# Patient Record
Sex: Male | Born: 1940 | Race: White | Hispanic: No | Marital: Married | Smoking: Former smoker
Health system: Southern US, Community
[De-identification: ages and names within clinical notes are randomized; demographics above are authoritative.]

## PROBLEM LIST (undated history)

## (undated) DIAGNOSIS — K8020 Calculus of gallbladder without cholecystitis without obstruction: Secondary | ICD-10-CM

## (undated) DIAGNOSIS — N200 Calculus of kidney: Secondary | ICD-10-CM

## (undated) DIAGNOSIS — M726 Necrotizing fasciitis: Secondary | ICD-10-CM

## (undated) DIAGNOSIS — E785 Hyperlipidemia, unspecified: Secondary | ICD-10-CM

## (undated) DIAGNOSIS — M1990 Unspecified osteoarthritis, unspecified site: Secondary | ICD-10-CM

## (undated) DIAGNOSIS — I739 Peripheral vascular disease, unspecified: Secondary | ICD-10-CM

## (undated) DIAGNOSIS — H16329 Diffuse interstitial keratitis, unspecified eye: Secondary | ICD-10-CM

## (undated) DIAGNOSIS — K219 Gastro-esophageal reflux disease without esophagitis: Secondary | ICD-10-CM

## (undated) DIAGNOSIS — I10 Essential (primary) hypertension: Secondary | ICD-10-CM

## (undated) DIAGNOSIS — K279 Peptic ulcer, site unspecified, unspecified as acute or chronic, without hemorrhage or perforation: Secondary | ICD-10-CM

## (undated) DIAGNOSIS — Z89511 Acquired absence of right leg below knee: Secondary | ICD-10-CM

## (undated) DIAGNOSIS — I2510 Atherosclerotic heart disease of native coronary artery without angina pectoris: Secondary | ICD-10-CM

## (undated) DIAGNOSIS — K635 Polyp of colon: Secondary | ICD-10-CM

## (undated) DIAGNOSIS — I219 Acute myocardial infarction, unspecified: Secondary | ICD-10-CM

## (undated) DIAGNOSIS — E119 Type 2 diabetes mellitus without complications: Secondary | ICD-10-CM

## (undated) DIAGNOSIS — D1803 Hemangioma of intra-abdominal structures: Secondary | ICD-10-CM

## (undated) HISTORY — DX: Calculus of gallbladder without cholecystitis without obstruction: K80.20

## (undated) HISTORY — DX: Peptic ulcer, site unspecified, unspecified as acute or chronic, without hemorrhage or perforation: K27.9

## (undated) HISTORY — DX: Hemangioma of intra-abdominal structures: D18.03

## (undated) HISTORY — DX: Peripheral vascular disease, unspecified: I73.9

## (undated) HISTORY — DX: Gastro-esophageal reflux disease without esophagitis: K21.9

## (undated) HISTORY — DX: Unspecified osteoarthritis, unspecified site: M19.90

## (undated) HISTORY — DX: Polyp of colon: K63.5

## (undated) HISTORY — DX: Diffuse interstitial keratitis, unspecified eye: H16.329

## (undated) HISTORY — DX: Essential (primary) hypertension: I10

## (undated) HISTORY — DX: Hyperlipidemia, unspecified: E78.5

## (undated) HISTORY — DX: Calculus of kidney: N20.0

## (undated) HISTORY — DX: Atherosclerotic heart disease of native coronary artery without angina pectoris: I25.10

## (undated) HISTORY — DX: Acute myocardial infarction, unspecified: I21.9

## (undated) HISTORY — DX: Necrotizing fasciitis: M72.6

## (undated) HISTORY — PX: LITHOTRIPSY: SUR834

## (undated) HISTORY — DX: Type 2 diabetes mellitus without complications: E11.9

---

## 1997-11-25 DIAGNOSIS — I219 Acute myocardial infarction, unspecified: Secondary | ICD-10-CM

## 1997-11-25 HISTORY — PX: CORONARY ARTERY BYPASS GRAFT: SHX141

## 1997-11-25 HISTORY — PX: LEG AMPUTATION: SHX1105

## 1997-11-25 HISTORY — DX: Acute myocardial infarction, unspecified: I21.9

## 1998-09-28 ENCOUNTER — Encounter: Payer: Self-pay | Admitting: Cardiology

## 1998-09-28 ENCOUNTER — Inpatient Hospital Stay (HOSPITAL_COMMUNITY): Admission: AD | Admit: 1998-09-28 | Discharge: 1998-10-18 | Payer: Self-pay | Admitting: Cardiology

## 1998-09-29 ENCOUNTER — Encounter: Payer: Self-pay | Admitting: Thoracic Surgery (Cardiothoracic Vascular Surgery)

## 1998-09-30 ENCOUNTER — Encounter: Payer: Self-pay | Admitting: Thoracic Surgery (Cardiothoracic Vascular Surgery)

## 1998-10-01 ENCOUNTER — Encounter: Payer: Self-pay | Admitting: Thoracic Surgery (Cardiothoracic Vascular Surgery)

## 1998-10-03 ENCOUNTER — Encounter: Payer: Self-pay | Admitting: Thoracic Surgery (Cardiothoracic Vascular Surgery)

## 1998-10-04 ENCOUNTER — Encounter: Payer: Self-pay | Admitting: Thoracic Surgery (Cardiothoracic Vascular Surgery)

## 1998-10-05 ENCOUNTER — Encounter: Payer: Self-pay | Admitting: Cardiothoracic Surgery

## 1998-10-06 ENCOUNTER — Encounter: Payer: Self-pay | Admitting: Thoracic Surgery (Cardiothoracic Vascular Surgery)

## 1998-10-07 ENCOUNTER — Encounter: Payer: Self-pay | Admitting: Thoracic Surgery (Cardiothoracic Vascular Surgery)

## 1998-10-08 ENCOUNTER — Encounter: Payer: Self-pay | Admitting: Cardiothoracic Surgery

## 1998-10-09 ENCOUNTER — Encounter: Payer: Self-pay | Admitting: Thoracic Surgery (Cardiothoracic Vascular Surgery)

## 1998-10-10 ENCOUNTER — Encounter: Payer: Self-pay | Admitting: Thoracic Surgery (Cardiothoracic Vascular Surgery)

## 1998-10-13 ENCOUNTER — Encounter: Payer: Self-pay | Admitting: Thoracic Surgery (Cardiothoracic Vascular Surgery)

## 1998-11-13 ENCOUNTER — Encounter: Admission: RE | Admit: 1998-11-13 | Discharge: 1999-02-11 | Payer: Self-pay | Admitting: Cardiology

## 1999-01-02 ENCOUNTER — Encounter
Admission: RE | Admit: 1999-01-02 | Discharge: 1999-04-02 | Payer: Self-pay | Admitting: Physical Medicine and Rehabilitation

## 1999-01-29 ENCOUNTER — Encounter
Admission: RE | Admit: 1999-01-29 | Discharge: 1999-04-24 | Payer: Self-pay | Admitting: Physical Medicine and Rehabilitation

## 1999-02-16 ENCOUNTER — Encounter
Admission: RE | Admit: 1999-02-16 | Discharge: 1999-05-14 | Payer: Self-pay | Admitting: Physical Medicine and Rehabilitation

## 2004-01-10 ENCOUNTER — Inpatient Hospital Stay (HOSPITAL_COMMUNITY): Admission: EM | Admit: 2004-01-10 | Discharge: 2004-01-12 | Payer: Self-pay | Admitting: *Deleted

## 2005-04-25 ENCOUNTER — Ambulatory Visit: Payer: Self-pay | Admitting: Cardiology

## 2005-11-25 HISTORY — PX: APPENDECTOMY: SHX54

## 2006-02-13 ENCOUNTER — Ambulatory Visit: Payer: Self-pay | Admitting: Infectious Diseases

## 2006-02-13 ENCOUNTER — Inpatient Hospital Stay (HOSPITAL_COMMUNITY): Admission: EM | Admit: 2006-02-13 | Discharge: 2006-02-20 | Payer: Self-pay | Admitting: Emergency Medicine

## 2006-02-13 ENCOUNTER — Ambulatory Visit: Payer: Self-pay | Admitting: Cardiology

## 2006-02-14 ENCOUNTER — Encounter (INDEPENDENT_AMBULATORY_CARE_PROVIDER_SITE_OTHER): Payer: Self-pay | Admitting: Specialist

## 2006-11-12 ENCOUNTER — Ambulatory Visit: Payer: Self-pay

## 2007-02-13 ENCOUNTER — Encounter: Admission: RE | Admit: 2007-02-13 | Discharge: 2007-02-13 | Payer: Self-pay | Admitting: Rheumatology

## 2007-10-28 ENCOUNTER — Ambulatory Visit: Payer: Self-pay | Admitting: Cardiology

## 2008-03-31 ENCOUNTER — Emergency Department (HOSPITAL_COMMUNITY): Admission: EM | Admit: 2008-03-31 | Discharge: 2008-04-01 | Payer: Self-pay | Admitting: Emergency Medicine

## 2008-04-06 ENCOUNTER — Ambulatory Visit (HOSPITAL_BASED_OUTPATIENT_CLINIC_OR_DEPARTMENT_OTHER): Admission: RE | Admit: 2008-04-06 | Discharge: 2008-04-06 | Payer: Self-pay | Admitting: Urology

## 2008-04-11 ENCOUNTER — Ambulatory Visit (HOSPITAL_COMMUNITY): Admission: RE | Admit: 2008-04-11 | Discharge: 2008-04-11 | Payer: Self-pay | Admitting: Urology

## 2008-11-02 ENCOUNTER — Ambulatory Visit: Payer: Self-pay | Admitting: Cardiology

## 2008-11-10 ENCOUNTER — Ambulatory Visit: Payer: Self-pay

## 2008-11-10 ENCOUNTER — Ambulatory Visit: Payer: Self-pay | Admitting: Cardiology

## 2008-11-10 LAB — CONVERTED CEMR LAB
ALT: 19 units/L (ref 0–53)
Alkaline Phosphatase: 28 units/L — ABNORMAL LOW (ref 39–117)
Bilirubin, Direct: 0.1 mg/dL (ref 0.0–0.3)
LDL Cholesterol: 85 mg/dL (ref 0–99)
Total Bilirubin: 0.7 mg/dL (ref 0.3–1.2)
VLDL: 12 mg/dL (ref 0–40)

## 2009-08-04 ENCOUNTER — Telehealth: Payer: Self-pay | Admitting: Cardiology

## 2009-09-13 ENCOUNTER — Encounter: Payer: Self-pay | Admitting: Cardiology

## 2009-10-31 DIAGNOSIS — E785 Hyperlipidemia, unspecified: Secondary | ICD-10-CM | POA: Insufficient documentation

## 2009-10-31 DIAGNOSIS — I10 Essential (primary) hypertension: Secondary | ICD-10-CM | POA: Insufficient documentation

## 2009-10-31 DIAGNOSIS — I2510 Atherosclerotic heart disease of native coronary artery without angina pectoris: Secondary | ICD-10-CM

## 2009-11-01 ENCOUNTER — Ambulatory Visit: Payer: Self-pay | Admitting: Cardiology

## 2010-02-07 ENCOUNTER — Telehealth: Payer: Self-pay | Admitting: Cardiology

## 2010-02-13 ENCOUNTER — Encounter: Payer: Self-pay | Admitting: Cardiology

## 2010-02-20 ENCOUNTER — Encounter: Admission: RE | Admit: 2010-02-20 | Discharge: 2010-02-20 | Payer: Self-pay | Admitting: Rheumatology

## 2010-06-26 ENCOUNTER — Encounter: Payer: Self-pay | Admitting: Cardiology

## 2010-07-07 ENCOUNTER — Encounter: Payer: Self-pay | Admitting: Cardiology

## 2010-07-10 ENCOUNTER — Encounter: Payer: Self-pay | Admitting: Cardiology

## 2010-12-04 ENCOUNTER — Encounter: Payer: Self-pay | Admitting: Cardiology

## 2010-12-27 NOTE — Consult Note (Signed)
Summary: [facility]  [facility]   Imported By: Marylou Mccoy 04/26/2010 15:53:31  _____________________________________________________________________  External Attachment:    Type:   Image     Comment:   External Document

## 2010-12-27 NOTE — Progress Notes (Signed)
Summary: REFILL   Phone Note Refill Request Message from:  Patient on February 07, 2010 2:39 PM  Refills Requested: Medication #1:  HYDROCHLOROTHIAZIDE 25 MG TABS 1 by mouth daily  Medication #2:  SIMVASTATIN 20 MG TABS 1 by mouth daily  Medication #3:  TOPROL XL 50 MG XR24H-TAB 1 by mouth daily SEND TO [facility]  Initial call taken by: Judie Grieve,  February 07, 2010 2:39 PM    Prescriptions: TOPROL XL 50 MG XR24H-TAB (METOPROLOL SUCCINATE) 1 by mouth daily  #90 x 3   Entered by:   Kem Parkinson   Authorized by:   Rollene Rotunda, MD, [facility]   Signed by:   Kem Parkinson on 02/07/2010   Method used:   Electronically to        [facility]* (mail-order)             ,          Ph: [phone]       Fax: [phone]   RxID:   [id number] SIMVASTATIN 20 MG TABS (SIMVASTATIN) 1 by mouth daily  #90 x 3   Entered by:   Kem Parkinson   Authorized by:   Rollene Rotunda, MD, [facility]   Signed by:   Kem Parkinson on 02/07/2010   Method used:   Electronically to        [facility]* (mail-order)             ,          Ph: [phone]       Fax: [phone]   RxID:   [id number] HYDROCHLOROTHIAZIDE 25 MG TABS (HYDROCHLOROTHIAZIDE) 1 by mouth daily  #90 x 3   Entered by:   Kem Parkinson   Authorized by:   Rollene Rotunda, MD, [facility]   Signed by:   Kem Parkinson on 02/07/2010   Method used:   Electronically to        [facility]* (mail-order)             ,          Ph: [phone]       Fax: [phone]   RxID:   [id number]

## 2010-12-27 NOTE — Letter (Signed)
Summary: [facility] Pres[facility]  [facility]   Imported By: Roderic Ovens 07/16/2010 11:32:07  _____________________________________________________________________  External Attachment:    Type:   Image     Comment:   External Document

## 2010-12-27 NOTE — Letter (Signed)
Summary: [facility] Office Visit Note   [facility] Visit [facility]   Imported By: Roderic Ovens 07/18/2010 11:36:28  _____________________________________________________________________  External Attachment:    Type:   Image     Comment:   External Document

## 2010-12-27 NOTE — Letter (Signed)
Summary: [facility]  [facility]   Imported By: Marylou Mccoy 12/11/2010 11:41:38  _____________________________________________________________________  External Attachment:    Type:   Image     Comment:   External Document

## 2011-01-21 ENCOUNTER — Telehealth: Payer: Self-pay | Admitting: Cardiology

## 2011-01-24 ENCOUNTER — Ambulatory Visit: Payer: Self-pay | Admitting: Cardiology

## 2011-01-31 NOTE — Progress Notes (Signed)
Summary: pt needs Rx asap  Medications Added HYDROCHLOROTHIAZIDE 25 MG TABS (HYDROCHLOROTHIAZIDE) 1 by mouth daily SIMVASTATIN 20 MG TABS (SIMVASTATIN) 1 by mouth daily METOPROLOL TARTRATE 25 MG TABS (METOPROLOL TARTRATE) 1 by mouth daily TOPROL XL 50 MG XR24H-TAB (METOPROLOL SUCCINATE) 1 by mouth daily ASPIRIN 81 MG  TABS (ASPIRIN) 1 by mouth daily MULTIVITAMINS   TABS (MULTIPLE VITAMIN) 1 by mouth daily CALCIUM-CARB 600 + D 600-125 MG-UNIT TABS (CALCIUM-VITAMIN D) 1 by mouth daily ZANTAC 75 75 MG TABS (RANITIDINE HCL) 2 by mouth daily PREDNISONE 1 MG TABS (PREDNISONE) 3 by mouth daily RESTORIL 15 MG CAPS (TEMAZEPAM) 2 by mouth daily FOSAMAX 70 MG TABS (ALENDRONATE SODIUM) 1 by mouth weekly PRILOSEC 20 MG CPDR (OMEPRAZOLE) 1 by mouth daily       Phone Note Refill Request Call back at Home Phone [phone] Message from:  Patient  pt needs to change all RX [facility] due to a insurance change to [facility] medicare complete plan with [facility] and the Member id # is [id number]  Initial call taken by: Omer Jack,  January 21, 2011 11:38 AM  Follow-up for Phone Call        RX sent into pharmacy. Pt is aware to make appt with [facility] asap because he is overdue and for [place] to keep filling his meds we need to see him. Pt aware and will make appt. Marrion Coy, CNA  January 22, 2011 9:24 AM  Follow-up by: Marrion Coy, CNA,  January 22, 2011 9:24 AM    Prescriptions: TOPROL XL 50 MG XR24H-TAB (METOPROLOL SUCCINATE) 1 by mouth daily  #90 Not Speci x 0   Entered by:   Marrion Coy, CNA   Authorized by:   Rollene Rotunda, MD, [facility]   Signed by:   Marrion Coy, CNA on 01/22/2011   Method used:   Electronically to        [facility]* (mail-order)       [address]       [place], [place]  [address]       Ph: [phone]       Fax: [phone]   RxID:   [id number] SIMVASTATIN 20 MG TABS (SIMVASTATIN) 1 by mouth daily  #90 Tablet x 0   Entered by:   Marrion Coy, CNA   Authorized by:   Rollene Rotunda, MD, [facility]   Signed by:   Marrion Coy, CNA on 01/22/2011   Method used:   Electronically to        [facility] ([facility]order)       [address]       [place], [place]  [address]       Ph: [phone]       Fax: [phone]   RxID:   [id number] HYDROCHLOROTHIAZIDE 25 MG TABS (HYDROCHLOROTHIAZIDE) 1 by mouth daily  #90 Tablet x 0   Entered by:   Marrion Coy, CNA   Authorized by:   Rollene Rotunda, MD, [facility]   Signed by:   Marrion Coy, CNA on 01/22/2011   Method used:   Electronically to        [facility] (mail-order)       [address]   [address]       Ph: [phone]       Fax: [phone]   RxID:   [id number]

## 2011-02-06 ENCOUNTER — Ambulatory Visit (INDEPENDENT_AMBULATORY_CARE_PROVIDER_SITE_OTHER): Payer: Medicare Other | Admitting: Cardiology

## 2011-02-06 ENCOUNTER — Encounter: Payer: Self-pay | Admitting: Cardiology

## 2011-02-06 DIAGNOSIS — R0989 Other specified symptoms and signs involving the circulatory and respiratory systems: Secondary | ICD-10-CM

## 2011-02-06 DIAGNOSIS — I10 Essential (primary) hypertension: Secondary | ICD-10-CM

## 2011-02-06 DIAGNOSIS — I2510 Atherosclerotic heart disease of native coronary artery without angina pectoris: Secondary | ICD-10-CM

## 2011-02-11 ENCOUNTER — Other Ambulatory Visit: Payer: Self-pay | Admitting: Cardiology

## 2011-02-11 DIAGNOSIS — R0989 Other specified symptoms and signs involving the circulatory and respiratory systems: Secondary | ICD-10-CM

## 2011-02-12 NOTE — Miscellaneous (Signed)
Summary: correction to med list  Clinical Lists Changes  Medications: Changed medication from RESTORIL 15 MG CAPS (TEMAZEPAM) 2 by mouth daily to RESTORIL 15 MG CAPS (TEMAZEPAM) one by mouth daily

## 2011-02-12 NOTE — Assessment & Plan Note (Signed)
Summary: OV/PER PAM/MJ  Medications Added RAPAFLO 8 MG CAPS (SILODOSIN) 1 by mouth daily ADVIL PM 200-25 MG CAPS (IBUPROFEN-DIPHENHYDRAMINE HCL) 1 by mouth at bedtime LISINOPRIL 5 MG TABS (LISINOPRIL) one daily      Allergies Added: NKDA  Visit Type:  Follow-up Primary Provider:  Dr. Christell Constant  CC:  CAD.  History of Present Illness: The patient presents for follow up of coronary disease. Since I last saw him he has had no new cardiovascular complaints. He has a prosthesis in less pain in his legs and previous. He is doing some household chores but not exercising routinely. He denies chest pressure, neck or arm discomfort. He has had no palpitations, presyncope or syncope. He has had no PND or orthopnea. He has had some cramping pain and he is left neck.  Current Medications (verified): 1)  Hydrochlorothiazide 25 Mg Tabs (Hydrochlorothiazide) .Marland Kitchen.. 1 By Mouth Daily 2)  Simvastatin 20 Mg Tabs (Simvastatin) .Marland Kitchen.. 1 By Mouth Daily 3)  Toprol Xl 50 Mg Xr24h-Tab (Metoprolol Succinate) .Marland Kitchen.. 1 By Mouth Daily 4)  Aspirin 81 Mg  Tabs (Aspirin) .Marland Kitchen.. 1 By Mouth Daily 5)  Multivitamins   Tabs (Multiple Vitamin) .Marland Kitchen.. 1 By Mouth Daily 6)  Calcium-Carb 600 + D 600-125 Mg-Unit Tabs (Calcium-Vitamin D) .Marland Kitchen.. 1 By Mouth Daily 7)  Zantac 75 75 Mg Tabs (Ranitidine Hcl) .... 2 By Mouth Daily 8)  Prednisone 1 Mg Tabs (Prednisone) .... 3 By Mouth Daily 9)  Restoril 15 Mg Caps (Temazepam) .... 2 By Mouth Daily 10)  Fosamax 70 Mg Tabs (Alendronate Sodium) .Marland Kitchen.. 1 By Mouth Weekly 11)  Prilosec 20 Mg Cpdr (Omeprazole) .Marland Kitchen.. 1 By Mouth Daily 12)  Rapaflo 8 Mg Caps (Silodosin) .Marland Kitchen.. 1 By Mouth Daily 13)  Advil Pm 200-25 Mg Caps (Ibuprofen-Diphenhydramine Hcl) .Marland Kitchen.. 1 By Mouth At Bedtime  Allergies (verified): No Known Drug Allergies  Past History:  Past Medical History: Reviewed history from 10/25/2009 and no changes required.  1. Coronary artery disease (LIMA to the LAD, saphenous vein graft to       obtuse  marginal 1 and obtuse marginal 2, saphenous vein graft to       intermediate, saphenous vein graft to the right coronary artery in       1999).   2. Right lower extremity amputation.   3. Treatment of necrotizing fasciitis following coronary artery bypass       grafting.   4. Cogan syndrome.   5. Hypertension.   6. Peptic ulcer disease.   7. Dyslipidemia.      Past Surgical History: Reviewed history from 10/25/2009 and no changes required. Right lower extremity amputation 1999, CABG 1999  (LIMA to the LAD, SVG to obtuse marginal, SVG to intermediate, SVG to right  coronary artery).  Review of Systems       As stated in the HPI and negative for all other systems.   Vital Signs:  Patient profile:   70 year old male Height:      68 inches Weight:      201 pounds BMI:     30.67 Pulse rate:   55 / minute Resp:     16 per minute BP sitting:   156 / 94  (right arm)  Vitals Entered By: Marrion Coy, CNA (February 06, 2011 11:54 AM)  Physical Exam  General:  Well developed, well nourished, in no acute distress. Head:  normocephalic and atraumatic Eyes:  PERRLA/EOM intact; conjunctiva and lids normal. Mouth:  Teeth, gums and palate normal.  Oral mucosa normal. Neck:  Neck supple, no JVD. No masses, thyromegaly or abnormal cervical nodes. Chest Wall:  well healed sternotomy scar Lungs:  Clear bilaterally to auscultation and percussion. Abdomen:  Bowel sounds positive; abdomen soft and non-tender without masses, organomegaly, or hernias noted. No hepatosplenomegaly. Msk:  Back normal, normal gait. Muscle strength and tone normal. Extremities:  status post right lower extremity amputation Neurologic:  Alert and oriented x 3. Skin:  Intact without lesions or rashes. Cervical Nodes:  no significant adenopathy Inguinal Nodes:  no significant adenopathy Psych:  Normal affect.   Detailed Cardiovascular Exam  Neck    Carotids: Carotids full and equal bilaterally with left bruit     Neck Veins: Normal, no JVD.    Heart    Inspection: no deformities or lifts noted.      Palpation: normal PMI with no thrills palpable.      Auscultation: S1 and S2 within normal limits, no S3, no S4, no clicks, no rubs, 2/6 early peaking brief systolic murmur at the apex and nonradiating, no diastolic murmurs  Vascular    Abdominal Aorta: no palpable masses, pulsations, or audible bruits.      Femoral Pulses: normal femoral pulses bilaterally.      Pedal Pulses: normal pedal pulses bilaterally, status post right AKA    Peripheral Circulation: no clubbing, cyanosis, or edema noted with normal capillary refill.     EKG  Procedure date:  02/06/2011  Findings:      Sinus bradycardia, rate 55, left deviation, interventricular conduction delay, unchanged from previous  Impression & Recommendations:  Problem # 1:  CAD (ICD-414.00) He has had no new symptoms since his stress test in 2009. I will not plan further testing at this point.  He will continue with risk reduction. Orders: EKG w/ Interpretation (93000)  Problem # 2:  DYSLIPIDEMIA (ICD-272.4) I reviewed his most recent lipids and they were at target particularly with an HDL in the 60s. He will remain on meds as listed. His updated medication list for this problem includes:    Simvastatin 20 Mg Tabs (Simvastatin) .Marland Kitchen... 1 by mouth daily  Problem # 3:  HYPERTENSION (ICD-401.9) Today's blood pressure reading and recent have not been at target. Therefore, I will 5 mg of lisinopril. We discussed weight loss. He will check a basic metabolic profile in 2 weeks.  Problem # 4:  CAROTID BRUIT (ICD-785.9) I will order a carotid Doppler. Orders: Carotid Duplex (Carotid Duplex)  Patient Instructions: 1)  Your physician recommends that you schedule a follow-up appointment in: 12 months with Dr Antoine Poche 2)  Your physician recommends that you return for lab work in:  2 weeks (BMP 401.1 v58.69) 3)  Your physician has recommended you make  the following change in your medication: start Lisinopril 5 mg daily 4)  Your physician has requested that you have an echocardiogram.  Echocardiography is a painless test that uses sound waves to create images of your heart. It provides your doctor with information about the size and shape of your heart and how well your heart's chambers and valves are working.  This procedure takes approximately one hour. There are no restrictions for this procedure. Prescriptions: LISINOPRIL 5 MG TABS (LISINOPRIL) one daily  #90 x 3   Entered by:   Charolotte Capuchin, RN   Authorized by:   Rollene Rotunda, MD, [facility]   Signed by:   Charolotte Capuchin, RN on 02/06/2011   Method used:   Electronically to  [facility] (mail-order)       [address]       [place], [place]  [address]       Ph: [phone]       Fax: [phone]   RxID:   [id number]  I have reviewed and approved all prescriptions at the time of this visit. Rollene Rotunda, MD, [facility]  February 06, 2011 12:37 PM

## 2011-02-13 ENCOUNTER — Encounter (INDEPENDENT_AMBULATORY_CARE_PROVIDER_SITE_OTHER): Payer: Medicare Other | Admitting: *Deleted

## 2011-02-13 ENCOUNTER — Other Ambulatory Visit: Payer: Self-pay | Admitting: *Deleted

## 2011-02-13 DIAGNOSIS — R0989 Other specified symptoms and signs involving the circulatory and respiratory systems: Secondary | ICD-10-CM

## 2011-02-13 DIAGNOSIS — I6529 Occlusion and stenosis of unspecified carotid artery: Secondary | ICD-10-CM

## 2011-02-27 ENCOUNTER — Encounter: Payer: Self-pay | Admitting: Cardiology

## 2011-02-28 ENCOUNTER — Telehealth: Payer: Self-pay | Admitting: *Deleted

## 2011-02-28 NOTE — Telephone Encounter (Signed)
Called pt to give him the results of his carotid doppler  Done 02/13/2011 which showed 0 - 39%RICA stenosis and 40-59% LICA stenosis.  Pt aware to repeat in 1 year.  Pt also states the medication Dr Antoine Poche started him on at the last office visit for his BP is helping - he started it about one week ago.  He will continue to monitor

## 2011-04-03 ENCOUNTER — Encounter: Payer: Self-pay | Admitting: Cardiology

## 2011-04-09 NOTE — Op Note (Signed)
[name] Cook, [name] Cook            ACCOUNT NO.:  [id number]   MEDICAL RECORD NO.:  [id number]          PATIENT TYPE:  AMB   LOCATION:  DAY                          FACILITY:  [facility]   PHYSICIAN:  Mark C. Vernie Ammons, M.D.  DATE OF BIRTH:  1941/08/07   DATE OF PROCEDURE:  04/06/2008  DATE OF DISCHARGE:                               OPERATIVE REPORT   PREOPERATIVE DIAGNOSES:  1. Bladder calculi.  2. Left ureteral calculus.   POSTOPERATIVE DIAGNOSES:  1. Bladder calculi (2.6cm).  2. Left ureteral calculus.   PROCEDURE:  1. Cystoscopy.  2. Right retrograde pyelogram with interpretation.  3. Cystolithalopaxy.  4. Left double-J stent placement.   SURGEON:  Mark C. Vernie Ammons, M.D.   ANESTHESIA:  General.   DRAINS:  6-French 26 cm double-J stent in the left ureter (no string).   SPECIMENS:  Stone taken to my office to be sent for compositional  analysis.   BLOOD LOSS:  Minimal.   COMPLICATIONS:  None.   INDICATIONS:  The patient is a 70 year old white male who presented to  my office with left flank pain and a CT scan revealing a 6 mm UPJ stone.  I also noted multiple bladder calculi.  He is brought to the operating  room for stent placement and then lithotripsy of his left upper ureteral  calculus as well as extraction of his bladder calculi today as well.  The risks, complications, alternatives, limitations were discussed.  The  patient understands and elected to proceed.   DESCRIPTION OF OPERATION:  After informed consent, the patient was  brought to the major OR, placed on the table, administered general  anesthesia, then moved to the dorsal lithotomy position.  His genitalia  was sterilely prepped and draped and an official time-out was performed.   A 22-French cystoscope was then introduced per urethra and the urethra  was noted to be normal by direct visualization.  The prostatic urethra  revealed no lesions.  There was no significant bilobar hypertrophy but  there was a  fairly high bladder neck.  Upon entering the bladder, I note  3 to 4+ trabeculation.  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  The cystoscope was passed into  this diverticulum and no tumor, stones or lesions were identified within  the diverticulum.  Several bladder stones were identified, photographed  and I also noted both ureteral orifices were of normal configuration and  position.  The remainder of the bladder was free of any worrisome  lesions or abnormality.   The 28-French resectoscope sheath with Timberlake obturator was then  introduced in the bladder, the obturator removed and the lithotrite was  passed through the resectoscope sheath.  The lithotrite was then used to  fragment the stones until they were all passable through the scope and  then I used the Ellik evacuator to the remove all stone fragments from  the bladder.  Reinspection revealed all stones had been completely  removed from the bladder.   I reinserted the 22-French cystoscope with 12  degrees lens and using a 6-  [place] open-ended ureteral catheter, a retrograde pyelogram was  performed of the left side in the standard fashion.  Full strength  contrast was injected through the open-ended catheter which was placed  in the left orifice and this was visualized under direct fluoroscopy.  The ureter was noted be entirely normal throughout its length without  any mass effect or filling defects.  At the level of the UPJ a stone was  easily identified as a filling defect.  The 0.038 inch floppy tip  guidewire was then passed through the open-ended catheter, up the left  ureter under direct fluoroscopy and into the area of the left kidney.  The open-end catheter was then removed and the stent was placed over the  guidewire and passed into the area of the renal pelvis which forced the  stone into the area  of the renal pelvis.  The guidewire was removed and  good curl was noted in the renal pelvis and the bladder.  The bladder  was then drained, the patient was then awakened and taken to recovery  room and he tolerated the procedure well without any intraoperative  complications.   He will be scheduled for lithotripsy of his stone which has now been  pushed back into the renal pelvis on the left-hand side on Monday.  In  the interim I will give him a prescription for Pyridium Plus #40 and  Tylox for pain #40.      Mark C. Vernie Ammons, M.D.  Electronically Signed     MCO/MEDQ  D:  04/06/2008  T:  04/06/2008  Job:  [id number]

## 2011-04-09 NOTE — Assessment & Plan Note (Signed)
[facility]                            CARDIOLOGY OFFICE NOTE   NAME:[name] Cook, [name] Cook                     MRN:          [id number]  DATE:10/28/2007                            DOB:          1941/04/09    PRIMARY CARE PHYSICIAN:  Dr. Vernon Prey.   REASON FOR PRESENTATION:  Evaluate patient with coronary disease and  hypertension.   HISTORY OF PRESENT ILLNESS:  The patient is 70 years old.  He returned  for yearly followup.  He says he has been doing well since I last saw  him.  He does report fluctuating blood pressures.  In the past, I tried  to add a little Altace to his regimen, but he called me up and said he  was so hypotensive he would have trouble not passing out at times.  Other times, his blood pressure is running in the 170s to 200s systolic  range.  He denies any symptoms.  In particular, he has not been having  any chest discomfort, neck discomfort, arm discomfort, activity-induced  nausea, vomiting, or excessive diaphoresis.  He has not been having any  palpitations, presyncope, or syncope.  He denies any PND or orthopnea.   PAST MEDICAL HISTORY:  Coronary artery disease (left internal mammary  artery to the left anterior descending, saphenous vein graft to obtuse  marginal 1 and obtuse marginal 2, saphenous vein graft to intermediate,  saphenous vein graft to right coronary artery), right lower extremity  amputation, treatment of necrotizing fasciitis following coronary artery  bypass grafting, Cogan's syndrome, hypertension, peptic ulcer disease,  dyslipidemia.   ALLERGIES:  None.   MEDICATIONS:  1. Multivitamin.  2. Zantac 75 mg 2 tablets daily.  3. Calcium.  4. Ecotrin 500 mg daily.  5. Prednisone 3 mg daily.  6. Toprol 50 mg daily.  7. Restoril 40 mg daily.  8. Simvastatin 20 mg daily.  9. Fosamax.   REVIEW OF SYSTEMS:  As stated in the HPI and otherwise negative for  other systems.   PHYSICAL EXAMINATION:  The patient  is in no distress.  Blood pressure 230/94, weight 203 pounds, body mass index 30, heart rate  58 and regular.  HEENT:  Eyelids unremarkable, pupils are equal, round, and reactive to  light, fundi not visualized.  Oral mucosa unremarkable.  NECK:  No jugular venous distension at 45 degrees, carotid upstroke  brisk and symmetrical.  No bruit.  No thyromegaly.  LYMPHATICS:  No cervical, axillary, inguinal adenopathy.  LUNGS:  Clear to auscultation bilaterally.  BACK:  No costovertebral angle tenderness.  CHEST:  Well-healed sternotomy scar.  HEART:  PMI not displaced or sustained.  S1 and S2 are within normal  limits.  No S3, no S4.  No clicks, no rubs, no murmurs.  ABDOMEN:  Mildly obese, positive bowel sounds, normal in frequency and  pitch.  No bruits, no rebound, no guarding.  No midline pulsatile mass.  No hepatomegaly, no splenomegaly.  SKIN:  No rashes, no nodules.  EXTREMITIES:  2+ pulses, no edema.  No cyanosis, no clubbing.  He is  status post left lower extremity amputation.  NEURO:  Oriented to person, place, and time.  Cranial nerves II-XII  grossly intact, motor grossly intact.   EKG sinus bradycardia, rate 58, left axis deviation, left anterior  fascicular block, poor anterior R-wave progression, possible  anteroseptal infarct, no acute ST-T wave changes.   ASSESSMENT AND PLAN:  1. Hypertension:  This is the most significant issue.  I have had      trouble treating this as it fluctuates significantly and his      response to a low dose of Altace was symptomatic hypotension.  At      this point, I am going to add hydrochlorothiazide 25 mg daily, and      potassium 10 mEq daily.  He is going to keep his blood pressure at      home where it is not nearly as high as it is in the office today.      He will let me know if it is running this high as we will need to      make adjustments sooner.  He is going to come back for a 10-day      blood pressure check at Dr. Kathi Der  office.  At that time he will      also get a BMET as well.  2. Dyslipidemia:  He has not had his lipids checked in a while.  We      will get a lipid profile and liver enzymes when he comes back in 10      days.  3. Coronary disease:  He is having no new symptoms.  It has been about      3 years since his stress test.  I will probably do one of these      next year when I see him back or sooner if he has any problems.  4. Obesity:  He understands the need to lose weight with diet and      exercise.  5. Followup:  I will see the patient back in about 4 months or sooner      if needed.     Rollene Rotunda, MD, [facility]  Electronically Signed    JH/MedQ  DD: 10/28/2007  DT: 10/28/2007  Job #: [id number]   cc:   Ernestina Penna, M.D.

## 2011-04-09 NOTE — Assessment & Plan Note (Signed)
[facility]                            CARDIOLOGY OFFICE NOTE   NAME:[name] Cook, [name] Cook                     MRN:          [id number]  DATE:11/02/2008                            DOB:          Jun 13, 1941    PRIMARY CARE PHYSICIAN:  Ernestina Penna, MD.   REASON FOR PRESENTATION:  Evaluate the patient with coronary artery  disease and hypertension.   HISTORY OF PRESENT ILLNESS:  The patient is 70 years old.  He presents  for followup of the above.  It is has been 1 year since I last saw him.  From a heart standpoint, he has done well.  He is not able to be  particularly active because of his right leg amputation.  However, he  does his chores of daily living, still works.  With this, he is not  having any chest discomfort, neck or arm discomfort.  He has not noticed  any palpitation, presyncope, or syncope.  He has had no PND or  orthopnea.  He has had some problems with kidney stones in the past year  and had to have these removed.  He had no cardiac complications with  this.   PAST MEDICAL HISTORY:  1. Coronary artery disease (LIMA to the LAD, saphenous vein graft to      obtuse marginal 1 and obtuse marginal 2, saphenous vein graft to      intermediate, saphenous vein graft to the right coronary artery in      1999).  2. Right lower extremity amputation.  3. 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  4. Cogan syndrome.  5. Hypertension.  6. Peptic ulcer disease.  7. Dyslipidemia.   ALLERGIES:  None.   MEDICATIONS:  1. Multivitamin.  2. Calcium.  3. Zocor 20 mg daily.  4. Fosamax.  5. Metoprolol 25 mg daily.  6. Hydrochlorothiazide 25 mg daily.  7. Temazepam 30 mg daily.  8. Aspirin 325 mg daily.  9. Ranitidine 150 mg daily.   REVIEW OF SYSTEMS:  As stated in the HPI and otherwise negative for  other systems.   PHYSICAL EXAMINATION:  GENERAL:  The patient is in no distress.  VITAL SIGNS:   Blood pressure 176/82, heart rate 53 and regular, weight  204 pounds, and body mass index 30.  HEENT:  Eyelids unremarkable.  Pupils equal, round, and react to light.  Fundi not visualized.  Oral mucosa unremarkable.  NECK:  No jugular venous distension at 45 degrees.  Carotid upstroke  brisk and symmetrical.  No bruits.  No thyromegaly.  LYMPHATICS:  No cervical, axillary, or inguinal adenopathy.  LUNGS:  Clear to auscultation bilaterally.  BACK:  No costovertebral angle tenderness.  CHEST:  Unremarkable except for well-healed sternotomy scar.  HEART:  PMI not displaced or sustained.  S1 and S2 within normal limits.  No S3, no S4.  No clicks, no rubs, no murmurs.  ABDOMEN:  Mildly obese.  Positive bowel sounds.  Normal in frequency and  pitch.  No bruits.  No rebound.  No guarding.  No midline pulsatile  mass.  No hepatomegaly.  No splenomegaly.  SKIN:  No rashes.  No nodules.  EXTREMITIES:  Pulses 2+ throughout, status post right above-knee  amputation.  No cyanosis, no clubbing, no edema.  NEUROLOGIC:  Oriented to person, place, and time.  Cranial nerves II-XII  are grossly intact.  Motor grossly intact.   EKG; sinus bradycardia, rate 53, first-degree AV block, intraventricular  conduction delay, left axis deviation, old anteroseptal myocardial  infarction.  No significant change from previous EKGs except for the PR  being more prolonged.   ASSESSMENT AND PLAN:  1. Coronary artery disease.  The patient has coronary artery disease      with 69 year old bypass grafts.  It has been several years since      his last stress perfusion study.  He is not particularly active      because of his amputation.  He has ongoing risk factors.  Given      this, he needs a screen with a stress perfusion study.  He would      not be able to walk on a treadmill, so he will have an adenosine      Cardiolite.  2. Dyslipidemia.  He is going to have a lipid profile when he comes      back for his  adenosine Cardiolite with the goal being LDL less than      100 and HDL greater than 40.  3. Obesity.  He understands the need to lose weight with diet and      exercise and I have discussed with him the need to try to find some      exercise he can do despite his amputation.  4. Hypertension.  Blood pressure has always been labile.  He keeps a      close watch on it at home and it typically is well controlled.      Again, when he comes to the doctor, it is elevated, but often times      is in 110 range systolic.  Therefore, he will continue on the      medications as listed.  Last year, I did add hydrochlorothiazide      and he seems to have tolerated this.  5. Followup.  I will see him back in 1 year or sooner if needed based      on the results of the stress perfusion study or future symptoms.    Rollene Rotunda, MD, [facility]  Electronically Signed   JH/MedQ  DD: 11/02/2008  DT: 11/03/2008  Job #: [id number]   cc:   Ernestina Penna, M.D.

## 2011-04-12 NOTE — Discharge Summary (Signed)
NAME:  [name] Cook, [name] Cook NO.:  [id number]   MEDICAL RECORD NO.:  [id number]                   PATIENT TYPE:  INP   LOCATION:  4711                                 FACILITY:  [facility]   PHYSICIAN:  Alvester Morin, M.D.               DATE OF BIRTH:  Mar 04, 1941   DATE OF ADMISSION:  01/10/2004  DATE OF DISCHARGE:  01/12/2004                                 DISCHARGE SUMMARY   PRIMARY CARE PHYSICIAN:  Dr. Rudi Heap at [facility] in [place].   DISCHARGE DIAGNOSES:  1. Nausea, vomiting and diarrhea likely viral gastroenteritis.  2. Elevated liver transaminase's.  3. Gallstone in blood pressure, no evidence of inflammation.  4. Hypotension.  5. Dehydration.  6. Acute renal failure.  7. Coronary artery disease, severe.  8. Rheumatoid arthritis.  9. Anemia, normocytic.  10.      Hiccups.   DISCHARGE MEDICATIONS:  1. Zantac 150 mg p.o. daily.  2. Calcium with vitamin D.  3. Ecotrin 500 mg one p.o. daily.  4. Prednisone 3 mg p.o. daily.  5. Folic acid 1 mg p.o. daily.  6. Toprol XL 50 mg p.o. daily.  7. Ambien 5 mg p.o. q.h.s. p.r.n. sleep.  8. Imuran 50 mg p.o. daily.  9. Zocor 20 mg p.o. daily.  10.      Fosamax 70 mg p.o. q. Sundays.  11.      Baclofen 5 mg 1-2 tablets p.o. q.8h p.r.n. hiccups.   FOLLOW UP APPOINTMENTS:  1. Follow up with Dr. Kellie Simmering in 1-2 weeks.  2. Follow up with Dr. Christell Constant on January 19, 2004 at 12 noon.  3. Follow up with Dr. Antoine Poche for a Cardiolite stress-test scheduled on     Tuesday, January 17, 2004 at noon at [facility] on     [address] in [place].  Note, the patient is not to eat after     midnight on Monday. January 16, 2004 in preparation for his Cardiolite     stress-test.   BRIEF ADMISSION HISTORY AND PHYSICAL:  [name] Cook is a 70 year old male  with past medical history of coronary artery disease, dyslipidemia,  rheumatoid arthritis and Cogan's syndrome who  presented to the emergency  department with a 1-day history of sudden intractable nausea, vomiting and  diarrhea.  The patient initially had some heart burn symptoms, took Zantac,  following that he began having vomiting of emesis that was dark brown  without blood.  Upon presentation to the emergency room the patient's blood  pressure was 85/45, temperature was 103.5, pulse 112 and respiratory rate  21, oxygen saturation was 94% on room air.   ADMISSION LABS:  EKG showed sinus tachycardia with T wave inversion in  several leads which was new compared to an EKG in 1999.  Sodium 139,  potassium 4, chloride 107, CO2 24, BUN 30, creatinine 1.7, glucose 108.  White count  7.7 thousand, hemoglobin 15.5, hematocrit 223, ANC 6.5, MCV  100.7.  Urinalysis negative.  Point of care cardiac enzymes negative.   PROCEDURES:  1. Acute abdominal x-ray series on January 10, 2004:  Moderately distended     small bowel loop in mid abdomen, no free air, nonspecific, question ileus     versus early partial small bowel obstruction, bilateral renal calculi,     cardiomegaly.   1. Abdominal CAT scan on January 10, 2004:  Mild ileus, no suggestion of     significant small bowel obstruction, possible cholelithiasis with     possible fluid around gallbladder and question of acute cholecystitis, no     focal masses, adenopathy or fluid collections in the pelvis.   1. Right upper quadrant ultrasound on January 11, 2004:  Ultrasound is     positive for gallstones, no evidence of acute cholecystitis.  Appearance     of liver:  Question diffuse hepatocellular disease.   CONSULTATIONS:  Dr. Antoine Poche, [facility].   HOSPITAL COURSE BY PROBLEM LIST:  1. Nausea, vomiting and diarrhea.  On the day of admission the patient's     intractable nausea, vomiting and diarrhea were thought to be secondary to     small bowel obstruction versus ileus, versus viral or bacterial     gastroenteritis, versus anginal  equivalent, versus sepsis.  The patient     was made n.p.o. and an NG tube was placed for decompression of the     stomach, given that the acute abdominal series initially showed partial     small bowel obstruction, versus ileus.  Additionally, due to the     patient's hypertension patient was begun on broad spectrum antibiotics,     IV fluids.  Blood cultures and stool cultures were obtained but returned     no growth to date.  Hemoccult was negative.  Medications by mouth were     held.  On the second day of admission the patient's nausea, vomiting and     diarrhea resolved and the fluid suctioned out by the nasogastric tube had     changed from dark brown to clear and gastric fluid appearing.  At that     time the NG tube was pulled given that CT scan did not support evidence     of small bowel obstruction.  Further etiology such as cholecystitis which     was possibly seen on abdominal CT was investigated with right upper     quadrant ultrasound.  The patient's right upper quadrant ultrasound and     clinical exam were not consistent with cholecystitis.  Of note, patient's     alkaline phosphatase was within normal range and AST and ALT were in     normal range on admission.  Later in the hospital stay the patient's AST     and ALT increased 105 and 56 respectively.  Given the patient's  quick     recovery from nausea, vomiting and diarrhea, the final conclusion for     etiology was viral gastroenteritis.  The patient was instructed to     advance his diet slowly as an outpatient and on the day of discharge he     was tolerating  soft food, but had not had emesis since admission.   1. Hypotension:  On admission the patient was significantly hypotensive and     was rehydrated aggressively after it was found that his echocardiogram in     1990 showed  an ejection fraction of 70%.  The hypotension was thought to    be secondary to dehydration and his blood pressure medications were held.      Blood cultures returned showing no bacteremia and the patient although     initially looking toxic, recovered within hours after IV hydration.   1. Acute renal failure:  The patient's creatinine elevated to 2.1 and was     thought to be secondary to dehydration and prerenal etiology.  After     aggressive hydration, on the day of discharge the patient's creatinine     was 1.2.   1. Coronary artery disease:  Of note, the patient had some T wave inversion     on EKG when compared to an EKG done in 1999.  There was a question about     whether he was experiencing ischemia due to stress from the nausea,     vomiting and diarrhea.  The patient did have mildly elevated troponin up     to 0.07 on January 10, 2004, with a CK-MB of 3.6 and a total CK of 1488.     Dr. Antoine Poche, the patient's cardiologist from [facility] was consulted and     recommended Cardiolite done as an outpatient.  This was scheduled before     the patient left the hospital.   1. Thrombocytopenia:  The patient's initial platelet count was within 300's     but on the following days ranged between 167 to 169.  The initial     measurement was thought to be inaccurate due to dehydration and possible     lab error.  The patient's thrombocytopenia was thought to be a true value     and was likely related to the patient's chronic methotrexate and Imuran     treatment.  It is recommended that his primary care physician follow up     on his platelet count to make sure this is not decreasing further.   1. Rheumatoid arthritis:  The patient has been on Prednisone chronically at     approximately 2 to 3 mg/day.  During his hospital stay he was placed on     stress dose steroids at 15 mg daily but on discharge was decreased back     down to his home dose.   1. Anemia:  The patient's hemoglobin decreased somewhat throughout his     hospital stay from 15.5 down to 11.2 on the day of discharge.  The drop     was thought to be secondary  to rehydration to a true value and a drop     secondary to some blood loss via NG tube after irritation of the     oropharynx.  The patient's hemoglobin and hematocrit should be checked at     hospital follow up to monitor for further anemia.  Of note, patient's MCV     on admission was 100, it is possible that he has some B12 or folate     deficiency but this was not checked during the hospital stay.   1. Hiccups:  Due to diaphragmatic irritation the patient experienced hiccups     on and off throughout his hospital stay.  They were assisted with     Thorazine p.r.n. and the patient was given some Baclofen to use as needed     while at home until this resolves.   1. Deep venous thrombosis prophylaxis:  Initially the patient was treated  with Lovenox injections to prevent deep venous thrombosis.  This was     discontinued when the thrombocytopenia was noted and when the patient had    some blood loss through nasogastric tube.  At that time TS hose were used     to prevent deep venous thrombosis.      Kerby Nora, MD                           Alvester Morin, M.D.    AB/MEDQ  D:  01/12/2004  T:  01/14/2004  Job:  [id number]   cc:   Ernestina Penna, M.D.  [address] [place]  [place] [address]  Fax: [phone]   Rollene Rotunda, M.D.   Aundra Dubin, M.D.

## 2011-04-12 NOTE — Op Note (Signed)
[name] Cook, [name] Cook            ACCOUNT NO.:  [id number]   MEDICAL RECORD NO.:  [id number]          PATIENT TYPE:  INP   LOCATION:  2550                         FACILITY:  [facility]   PHYSICIAN:  Sandria Bales. Ezzard Standing, M.D.  DATE OF BIRTH:  1941/02/28   DATE OF PROCEDURE:  02/13/2006  DATE OF DISCHARGE:                                 OPERATIVE REPORT   PREOPERATIVE DIAGNOSIS:  Appendicitis.   POSTOPERATIVE DIAGNOSIS:  Ruptured appendicitis with intraloop abscesses.   PROCEDURE:  Laparoscopic appendectomy with irrigation with approximately 4 L  of saline.   SURGEON:  Sandria Bales. Ezzard Standing, M.D.   ASSISTANT:  None.   ANESTHESIA:  General endotracheal.   ESTIMATED BLOOD LOSS:  Minimal.   INDICATIONS FOR PROCEDURE:  [name] Cook is a 70 year old white male who  developed severe abdominal pain over maybe 48 hours.  At first they thought  this may be a viral illness, but he has gotten steadily worse.  He presented  to the [facility] emergency room and was evaluated and seen.   CT scan was consistent with acute appendicitis with multiple appendoliths in  his appendix.  He was seen by Dr. Antoine Poche and group for evaluation  preoperatively, and they thought from a cardiac standpoint that he would be  stable to go ahead with surgery.  He now comes to the operating room.   The indications and potential complications were explained to the patient,  his wife, and both of his sons were at the bedside.  The potential risks  include but are not limited to bleeding, infection (which I think he already  has), bowel injury or resection of bowel, and open surgery.   DESCRIPTION OF PROCEDURE:  The patient was placed in the supine position and  given a general endotracheal anesthetic.  His [place] catheter was placed.  His abdomen was prepped with Betadine solution and sterilely draped.  The  patient was already on Unasyn as an antibiotic.   I went through an infraumbilical incision with sharp dissection  carried down  to the abdominal cavity. A 0-degree 10-mm laparoscope was inserted.  Up in  the right upper quadrant, I put a 5-mm, the left lower quadrant a 10-mm  trocar.  Upon entering the abdomen, he had gross contamination and intraloop  abscesses.  Cultures were obtained and sent for culture and gram stain.   I then focused on the appendix.  The appendix was ruptured and stuck down  above the pelvic rim, sort of in the right lower quadrant gutter.   I teased the appendix off the retroperitoneum.  We took the mesentery of the  appendix down with a Harmonic.  The appendix had ruptured or perforated into  the proximal one-third and basically had destroyed the appendix.  I got down  to the base of the appendix and fired a Endo GIA stapler.  This went  somewhat into the cecum.  I took a second and teed across this to get the  appendix out.   I then placed the appendix in an EndoCatch bag and delivered it out.  I then  reinspected my staple  line.  I used 4 L of irrigation.  I spent a lot of  time looking at the staple line, and it appeared without any evidence of  ischemia.  The staple line was complete.  I then tried to irrigate between  each of the loops, down the pelvis, and over the right lobe of the liver,  again with about 4 L of total irrigation solution.   I thought I had gotten all of the gross contamination out.  I then removed  the ports in turn, the left lower quadrant and right upper quadrant.  There  was no bleeding at either port site.  The umbilical port was closed with a 0  Vicryl suture.  The skin at each port was closed with a 5-0 Vicryl suture  and painted with tincture of benzoin and Steri-Striped.   The patient tolerated the procedure well.  He was transported to the  recovery room in good condition.      Sandria Bales. Ezzard Standing, M.D.  Electronically Signed     DHN/MEDQ  D:  02/13/2006  T:  02/16/2006  Job:  [id number]   cc:   Ernestina Penna, M.D.  Fax:  [phone]   Rollene Rotunda, M.D.  [phone] [address]  [address]  [place]  [place] [address]   Aundra Dubin, M.D.  [address]  [place]  [place] [address]

## 2011-04-12 NOTE — H&P (Signed)
[name] Cook, [name] Cook            ACCOUNT NO.:  [id number]   MEDICAL RECORD NO.:  [id number]          PATIENT TYPE:  INP   LOCATION:  3307                         FACILITY:  [facility]   PHYSICIAN:  [name] Cook, M.D.  DATE OF BIRTH:  02-May-1941   DATE OF ADMISSION:  02/13/2006  DATE OF DISCHARGE:                                HISTORY & PHYSICAL   PRIMARY CARE PHYSICIAN:  Dr. Vernon Prey at [facility].   CARDIOLOGIST:  Dr. Antoine Poche   CHIEF COMPLAINT:  Right lower quadrant abdominal pain with associated  sodium, vomiting, and diarrhea.   HISTORY OF PRESENT ILLNESS:  [name] Cook is a 70 year old male patient  with a known history of CABG x5 in 1999, rheumatoid arthritis on chronic  prednisone, as well as Cogan's syndrome.   He presented to the ER due to complaints of severe abdominal pain associated  with emesis and watery diarrhea. The patient tells me he developed the  abdominal pain on Tuesday night which he describes as his belly being sore.  Also had associated emesis and diarrhea. He thought he was experiencing a  viral syndrome. Unfortunately, his pain never really resolved and continued  over the next few days, increasing in severity. Wednesday night it was  extremely severe. He went from having looser stools to more watery stools at  3 a.m. this morning. His wife states he has not had any fevers or any blood  in his emesis or stool, but has noticed that today when he was throwing up  more of a brown or coffee-ground appearance.   The patient presented to the ER this morning around 7:20 a.m. and a full  workup was then completed. He was found to be dehydrated - IV fluids have  been started, hypokalemic - he has received 20 mEq of potassium IV. He had  mild leukocytosis - white count 11,900. Clinical exam was consistent with  probable acute abdomen. Plain abdominal films showed a mild ileus. Chest x-  ray shows no heart failure.  CT of the abdomen  and pelvis demonstrated appendicitis with definite  edematous appendix and stranding and multiple appendicoliths, and again,  plain abdominal films show a mild ileus. His baseline creatinine is normally  about 1.5 and today is up to 2.2. The patient has also been complaining of  increased abdominal distention since the onset of the pain and, again,  watery diarrhea since 3 a.m. this morning. He has also developed hiccups  since he started vomiting. He had the similar problem after intractable  nausea and vomiting when he had acute gastroenteritis in 2005.   REVIEW OF SYSTEMS:  As per the history of present illness. He denies any  chest pain, shortness of breath, dyspnea on exertion, or tachypalpitations.  He has seen Dr. Antoine Poche in cardiology this summer with apparently no acute  cardiac issues found at that time. His last stress test was 2 years ago.   FAMILY MEDICAL HISTORY:  His mother died of a bowel obstruction. Father died  of complications related to rheumatoid arthritis. He has two sisters. One  has problem with  some sort of aneurysm. Another has some sort of heart  problems.   SOCIAL HISTORY:  He quit smoking in 1999. He does not drink alcohol. He is  married.   PAST MEDICAL HISTORY:  1.  Rheumatoid arthritis on chronic prednisone.  2.  Cogan's syndrome.  3.  Dyslipidemia.  4.  History of CAD, apparently asymptomatic.  5.  Peptic ulcer disease.  6.  History of nephrolithiasis.  7.  Assumed normal LV function.   PAST SURGICAL HISTORY:  1.  CABG x5 in 1999.  2.  Right BKA status post necrotizing fasciitis.   ALLERGIES:  No known drug allergies.   CURRENT MEDICATIONS:  1.  Ecotrin 500 mg daily.  2.  Calcium with D 600 mg daily.  3.  Fosamax 70 mg daily.  4.  Centrum multiple vitamin daily.  5.  Prednisone 3 mg daily.  6.  Restoril 40 mg at h.s.  7.  Toprol-XL 50 mg daily.  8.  Zantac 25 mg daily.  9.  Zocor 20 mg daily.   PHYSICAL EXAMINATION:  GENERAL:   Pleasant male, obviously acutely-ill-  appearing, complaining of severe abdominal pain, abdominal distention,  diarrhea, and vomiting.  VITAL SIGNS:  Temperature 97.6, blood pressure 144/68, pulse rate 112 and  regular, respirations 24.  NEUROLOGIC:  The patient is alert and oriented, moving all extremities x4.  No focal deficits.  HEENT:  Head is normocephalic, sclerae noninjected.  NECK:  Supple, no adenopathy.  CHEST:  Bilateral lung sounds are clear to auscultation posteriorly.  Respiratory effort is nonlabored. He is saturating 95% on 2 L nasal cannula  O2.  CARDIAC:  There is an S4. No JVD, no carotid bruits. Pulse is regular.  ABDOMEN:  Distended, bowel sounds are absent. NG is to low wall suction  demonstrating coffee ground and yellow return. The patient demonstrates  significant right lower quadrant tenderness with guarding and rebounding on  exam.  EXTREMITIES:  Symmetrical in appearance without edema, cyanosis, or  clubbing. The patient does have a right BKA prosthesis in place.   LABORATORY:  White count 11,900; hemoglobin 16.5; hematocrit 46.9; platelets  201,000; neutrophils 88%; lymphocytes 7%; urinalysis negative. PTT 43, PT  16.3, INR 1.3. Sodium 139, potassium 3.0, CO2 23, glucose 123, BUN 17,  creatinine 2.2. LFTs are normal except for low alkaline phosphatase of 31,  total bilirubin 1.8, lipase is 21, amylase is 65.  Diagnostics:  Acute abdominal series, CT of the abdomen and pelvis as noted  in the history of present illness. An EKG has been done after my arrival.  This demonstrates sinus tachycardia. In comparison to previous EKG from 2005  there are no acute ischemic changes.   IMPRESSION:  1.  Acute appendicitis.  2.  Ileus with small-bowel obstruction pattern noted on plain films. This is      secondary to acute appendicitis.  3.  Leukocytosis with left shift. 4.  Acute renal failure secondary to volume depletion/sepsis.  5.  Diarrhea and nausea and  vomiting secondary to acute appendicitis.  6.  History of coronary artery disease, currently asymptomatic; associated      tachycardia.  7.  Hypokalemia secondary to diarrhea and emesis.  8.  Rheumatoid arthritis on chronic prednisone.  9.  Cogan's syndrome.  10. Borderline coagulopathy, possible early systemic inflammatory response      syndrome.   PLAN:  1.  Admit the patient to the ICU to [facility] and to OR for  appendectomy.  2.  I am attempting to get in touch with internal medicine teaching service      to help [place] manage the patient's multiple medical problems. Especially      concerning is the patient's prednisone dependence. He will probably need      stress-dosing steroids and we will need assistance with managing the      steroids.  3.  I have notified Dr. Jenene Slicker P.A. that the patient will need emergent      surgery. They will follow with [place] in the postoperative period.  4.  Plan volume resuscitation, begin D-5-normal saline with 30 of potassium      at 150 an hour.  5.  Empiric IV Unasyn 3 g q.6h.  6.  Check a CBC, CMET, PT, PTT in the morning.  7.  Before antibiotics, check blood cultures x2.  8.  Probably will be presenting to the OR staff this afternoon pending      availability of surgeon and OR suite. Both are aware this is an urgent      case.  9.  We will use Dilaudid IV p.r.n. pain.  10. Continue NG to low wall suction. The patient has a history of peptic      ulcer disease. Will use PPI for GI prophylaxis daily.      Allison L. Rennis Harding, N.P.      [name] Cook, M.D.  Electronically Signed    ALE/MEDQ  D:  02/13/2006  T:  02/14/2006  Job:  [id number]   cc:   Rollene Rotunda, M.D.  [address]  [address]  [place]  [place] [address]

## 2011-04-12 NOTE — Discharge Summary (Signed)
[name] Cook, [name] Cook NO.:  [id number]   MEDICAL RECORD NO.:  [id number]          PATIENT TYPE:  INP   LOCATION:  5013                         FACILITY:  [facility]   PHYSICIAN:  Sandria Bales. Ezzard Standing, M.D.  DATE OF BIRTH:  1941-02-15   DATE OF ADMISSION:  02/13/2006  DATE OF DISCHARGE:  02/20/2006                                 DISCHARGE SUMMARY   DISCHARGE PHYSICIAN:  Dr. Caswell Corwin   CHIEF COMPLAINT:  Mr. Gordillo is a 70 year old male with known history of  CABG, rheumatoid arthritis on prednisone and Cogan syndrome.  He presented  to the ER with complaints of severe abdominal pain associated with emesis  and watery diarrhea.  The patient initially thought he had a viral syndrome.  Pain continued and increased in severity.  He went from having looser stools  to more watery stools by 3 o'clock on the morning of admission.  Wife states  he has not had any fevers or any blood in his stools, but noticed today when  he was having emesis, there was more of a brown or coffee-ground appearance.  He presented to the ER at 7:20 on the date of admission for workup which was  completed.   The patient was found to be dehydrated and hypokalemic.  White count was  11,900.  Clinical exam was consistent with probable acute abdomen.  Plain  films demonstrated a mild ileus.  Chest x-ray showed no heart failure.  A  subsequent CT of the abdomen and pelvis revealed appendicitis with a  definite edematous appendix with stranding and multiple appendicolith.  Creatinine which at baseline was 1.5, was 2.2 in the ER.  The patient is  also having problems with intractable hiccups.  This has been a problem in  the past when he has had other GI issues such as acute gastroenteritis.  On  initial exam, he was afebrile.  Vital signs were stable with blood pressure  144/68, pulse rate tachycardic at 112.  On exam, again, the abdomen was  quite distended.  There were no bowel sounds present.  NG tube  had been  placed in the ER with __________ suction and coffee-ground returns and  yellow returns were noted.  The patient had significant right lower quadrant  tenderness with guarding nondistended rebounding on exam.  Initial white  count 11,900, neutrophils 88%, hemoglobin 16.5, PTT 43, PT 60.3, INR 1.3,  sodium 139, potassium 3.0, glucose 123, BUN 17, creatinine 2.2, lipase 21,  amylase 65.  Total bilirubin 18.  The patient was admitted that afternoon  with following diagnoses.   DIAGNOSES:  1.  Acute appendicitis.  2.  Ileus secondary to acute appendicitis.  3.  Leukocytosis.  4.  Acute renal failure secondary to volume depletion and possible sepsis.  5.  Associated diarrhea, nausea and vomiting due to acute appendicitis.  6.  Known history of CAD, currently symptomatic.  7.  Hypokalemia secondary to diarrhea and emesis.  8.  Rheumatoid arthritis on chronic prednisone.  9.  He has Cogan's syndrome.   HOSPITAL COURSE:  The patient was admitted to he hospital with initial  plans  possibly to go to the ICU because of patient having an acute abdomen.  He  was taken urgently to the OR for diagnosis of acute appendicitis prior to  surgery because of the patient's cardiac history.  Cardiology services were  consulted to make sure there were not any acute issues to prohibit the  patient from proceeding with surgery, and they felt that there was the  urgency of the patient's condition in the fact that the patient was  asymptomatic regarding cardiac symptoms over the past year that he would be  appropriate to proceed with an urgent surgery.   On the day of admission at 8 o'clock that evening, the patient was taken to  the OR by Dr. Ezzard Standing for laparoscopic appendectomy.  He was found to have a  ruptured appendix with intra-loop abscesses.  The patient was admitted to  the stepdown unit postoperatively.   Throughout the next few days, the patient continued to have problems related  to  postop ileus and needed to correct electrolyte imbalances.  White count  by February 14, 2006, had come down to 10,200, hemoglobin down to 13 after  hydration.  Creatinine was still at 2.0, but trending downward.  BUN 22.  His abdomen was soft but distended.  No bowel sounds and clinical findings  were consistent with the postoperative ileus.  He was started on Unasyn  preoperatively, and this continued throughout the hospitalization.  Labs  were checked serially, and abdominal films were checked periodically to  follow the status of the ileus and correlate with clinical symptoms.   Because of the patient's multiple medical problems, Internal Medicine was  contacted to assist [place] with management of patient's medical problems.  The  patient was unassigned here at [facility].  So, Teaching Services followed the  patient from an Internal Medicine standpoint.  Because of continued  postoperative ileus symptoms, Internal Medicine opted to convert appropriate  cardiac medications to IV route .  This continued until the patient was  tolerated oral diet, and medications were subsequently returned to oral  route at prior to admission dosages.   Now the patient continued to have trouble with fevers, but normal white  count.  Urinalysis and culture were obtained during the hospitalization.  This showed positive culture of E. coli and enterococcus that was resistant  to ampicillin.  Antibiotics were subsequently changed over to Levaquin to  treat the resistant urinary tract infection.  The patient continued with  intractable hiccups and overall continued to improve slowly.  By February 19, 2006, the patient began to have foreign bowel movements.  His abdomen  remained distended, and he continued with hiccups, but he was tolerating  medication in applesauce and oatmeal diet.  His abdominal incision had  serous drainage, but no signs of acute infection and overall was felt to be doing well.  He was, again, on a  Dulcolax suppository with improved  evacuation of bowels.  His diet had been advanced later in the afternoon on  February 19, 2006.  He was tolerating the diet by February 20, 2006.  His vital  signs were stable, and he was deemed otherwise appropriate for discharge  home.  Because of a combination of resistant and __________ with E. coli and  enterococcus, the final antibiotic cocktail which worked for this patient  was Cipro and Flagyl.  He received a total of two days of this medication  prior to discharge.  He was continued on this at discharge.  FINAL DISCHARGE DIAGNOSES:  1.  Perforated appendix status post laparoscopic appendectomy on date of      admission.  2.  Postop ileus with partal small bowel obstruction, resolving.  3.  Hypokalemia.  4.  E. coli enterococcus urinary tract infection.  5.  Hypertension.  6.  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   DISCHARGE MEDICATIONS:  1.  Vicodin q.6 h p.r.n. pain.  2.  Haldol 2.5 mg one tablet in the morning and two tablets at bedtime for      intractable hiccups.  3.  Zantac 150 mg daily.  4.  Calcium 1200 mg daily.  5.  Ecotrin 500 mg daily.  6.  Prednisone 3 mg daily.  7.  Toprol XL 50 mg daily.  8.  Restoril 30 mg q.8 h sleep p.r.n. insomnia.  9.  Zocor 20 mg daily.  10. Centrum multivitamin daily.  11. Fosamax weekly, usually taken on Sunday.   DISCHARGE INSTRUCTIONS:  1.  Diet:  No restrictions.  2.  Activity:  No driving for four days.  3.  Wound care:  Allow Steri-Strips to fall off.  Dry dressings to wounds      that are draining.  May shower.   FOLLOWUP:  The patient is to see either Dr. Ezzard Standing or Dr. Lurene Shadow in 2-3  weeks at [facility].  He needs to call for an appointment.      Allison L. Rennis Harding, N.P.      Sandria Bales. Ezzard Standing, M.D.  Electronically Signed    ALE/MEDQ  D:  04/09/2006  T:  04/10/2006  Job:  [id number]   cc:   Nicki Guadalajara, M.D.  Fax: [phone]

## 2011-04-12 NOTE — Assessment & Plan Note (Signed)
[facility]                            CARDIOLOGY OFFICE NOTE   NAME:Cook, [name]                     MRN:          [id number]  DATE:11/12/2006                            DOB:          03-31-1941    PRIMARY CARE PHYSICIAN:  Ernestina Penna, M.D.   REASON FOR PRESENTATION:  Evaluate patient with coronary disease and  hypertension.   HISTORY OF PRESENT ILLNESS:  The patient returns for yearly followup. He  has done well since I last saw him. He is not as active as he would like  because of his amputation and his difficulty walking. He has gained a  little weight over the last 12 months. With his level of activity, he  denies any chest pressure, neck discomfort, arm discomfort, activity-  induced nausea or vomiting, excessive diaphoresis. He has had no  palpitations, pre-syncope or syncope. He denies any PND or orthopnea.   Of note, his blood pressure is elevated today as described below. He  says at home his blood pressure is well-controlled in the 140/70 range  routinely. He is trying to watch his diet, but sometimes lapses.   PAST MEDICAL HISTORY:  1. Coronary artery disease, status post coronary artery bypass graft      (left internal mammary artery to the left anterior descending      artery, saphenous vein graft to obtuse marginal 1 and obtuse      marginal 2, saphenous vein graft to intermediate, saphenous vein      graft to right coronary artery).  2. 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  3. Cogan's syndrome.  4. Peptic ulcer disease.  5. Mild dyslipidemia.   ALLERGIES:  None.   MEDICATIONS:  1. Multivitamin.  2. Calcium.  3. Zantac 75 mg b.i.d.  4. Toprol 50 mg daily.  5. Zocor 20 mg nightly.  6. Fosamax 70 mg a week.  7. Prednisone 3 mg daily.  8. Aspirin 325 mg daily.   REVIEW OF SYSTEMS:  As stated in the HPI and otherwise negative for  other  systems.   PHYSICAL EXAMINATION:  The patient is in no distress. Blood pressure  190/92, heart rate 55 and regular, weight is 202 pounds. Body mass index  is 30.  HEENT: Eyes unremarkable. Pupils equal, round, and reactive to light.  Fundi not visualized. Oral mucosa is unremarkable.  NECK: No jugular venous distention at 45-degrees.  Carotid upstroke  brisk and symmetric. No bruits, no thyromegaly.  LYMPHATICS: No cervical, axillary or inguinal adenopathy.  LUNGS: Clear to auscultation bilaterally.  BACK: No costovertebral angle tenderness.  CHEST: Well-healed sternotomy scar.  HEART: PMI not displaced or sustained. S1, S2 within normal limits. No  S3. No S4. No murmurs.  ABDOMEN: Obese, positive bowel sounds, normal in frequency and pitch. No  bruits. No rebounds. No guarding. No midline pulsatile mass. No  hepatomegaly. No splenomegaly.  SKIN: No rashes, no nodules.  EXTREMITIES: 2+ pulses upper and lower extremity (right lower extremity  prosthesis).  NEURO: Oriented to person, place and  time. Cranial nerves II-XII grossly  intact. Motor grossly intact.   EKG: Sinus bradycardia, rate 55, left axis deviation, left anterior  fascicular block, poor anterior R-wave progression. 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 Persistent anterior ST elevation, not changed  from previous EKGs.   ASSESSMENT/PLAN:  1. Coronary disease. The patient is having no new symptoms. I re-      emphasized secondary risk reduction as he does have room for      improvement. No further cardiovascular evaluation is warranted at      this point.  2. Hypertension. Blood pressure was very elevated today and I repeated      it and it was found to be 220 systolic. I kept him in the office      and had him lie down in a quiet room. Very quickly, his blood      pressure came down to 180 systolic/70s. He is going to keep a close      blood pressure diary at home, taking 3 or 4 times a week and let me       know of these results. We might need medication titration is he is      persistently elevated at home.  3. Dyslipidemia. He has had lab work drawn and I will be reviewing      this when it is available.  4. Obesity. He understands that he is on a difficult trend with his      weight increasing. He understands he needs to do better with diet      and try to find some form of exercise though this is difficult.   FOLLOWUP:  I will see him in 1 year if he is doing alright, but he will  let me know about his blood pressures.     Rollene Rotunda, MD, [facility]  Electronically Signed   JH/MedQ  DD: 11/12/2006  DT: 11/12/2006  Job #: [id number]   cc:   Ernestina Penna, M.D.

## 2011-04-12 NOTE — Consult Note (Signed)
[name], [name] Cook NO.:  [id number]   MEDICAL RECORD NO.:  [id number]          PATIENT TYPE:  INP   LOCATION:  1825                         FACILITY:  [facility]   PHYSICIAN:  Rollene Rotunda, M.D.   DATE OF BIRTH:  [birth year]   DATE OF CONSULTATION:  DATE OF DISCHARGE:                                   CONSULTATION   REASON FOR CONSULTATION:  Evaluate patient with coronary disease.  He is  being evaluated for probable acute appendicitis.   HISTORY OF PRESENT ILLNESS:  Patient is well known to me.  He has a history  of coronary disease as described below.  I have been following him since his  bypass and he has done well.  Most recently, he had a stress perfusion study  in February 2005.  This demonstrated EF to be 70% with no ischemia or  infarct.  Since that time, he has had no new cardiac symptoms.  He remains  active, walking.  He can achieve at least 5 METS.  With this, he denies any  chest discomfort, neck discomfort, arm discomfort, activity-induced nausea,  vomiting __________ diaphoresis.  He has no palpitations, presyncope or  syncope.  He denies any PND or orthopnea.   Of note, the patient now has had a couple of days of increasing abdominal  discomfort, nausea, vomiting, and fevers.  He is being evaluated and felt to  have acute appendicitis by findings on a CT scan.  He is probably being  taken urgently to the operating room for management of this.   PAST MEDICAL HISTORY:  Necrotizing fasciitis in his right lower extremity  after bypass, hypertension, peptic ulcer disease, hyperlipidemia, __________  syndrome, coronary disease.   PAST SURGICAL HISTORY:  Right lower extremity amputation 1999, CABG 1999  (LIMA to the LAD, SVG to obtuse marginal, SVG to intermediate, SVG to right  coronary artery).   SOCIAL HISTORY:  The patient lives in [place] with his wife.  He is an  Chiropodist.  He has 2 sons.  He quit smoking in 1999.  He  drinks socially.   FAMILY HISTORY:  Noncontributory for early coronary artery disease.   REVIEW OF SYSTEMS:  As stated in the HPI, is negative for other systems.   PHYSICAL EXAMINATION:  GENERAL:  The patient appears acutely ill.  VITAL SIGNS:  Heart rate 110 and regular, respiratory rate 22, blood  pressure 144/68, afebrile.  HEENT:  Eyelids are unremarkable; pupils equal, round and reactive to light,  fundi not visualized, oral mucosa unremarkable.  NECK:  No jugular venous distention, waveform within normal limits, carotid  upstroke brisk and symmetric, no bruits, no thyromegaly.  LYMPHATICS:  No cervical, axillary or inguinal adenopathy.  LUNGS:  Clear to auscultation bilaterally.  BACK:  No costovertebral angle tenderness.  CHEST:  Unremarkable.  HEART:  PMI not displaced or sustained, S1 and S2 within normal limits, no  S3, no S4, no murmurs.  ABDOMEN:  Mildly distended, mild diffuse tenderness and rebound, absent  bowel sounds, no obvious hepatomegaly or splenomegaly.  SKIN:  No rashes, no nodules.  EXTREMITIES:  2+ upper pulses, 2+ left posterior tibialis and dorsalis  pedis, no cyanosis, no clubbing, no edema.  NEURO:  Oriented to person, place and time; cranial nerves II through XII  grossly intact, motor grossly intact throughout.   WBC 11.9, hemoglobin 16.5, platelets 139, potassium 3.0, BUN 17, creatinine  2.2, amylase 65, lipase 21.   Coronary artery disease:  The patient has coronary disease as described.  However, he has had no recent cardiovascular symptoms.  He has an active  functional level.  He has had a recent stress perfusion study without a  change in symptoms since that time.  At this point, no further  cardiovascular testing is suggested.  Blood pressure can be managed with IV  medications, which most likely include beta blockers as his blood pressure  tolerates.  His tachycardia picture is physiologic and responds to his acute  illness.  We will  continue to follow him through his hospitalization.  No  further cardiovascular testing is suggested.           ______________________________  Rollene Rotunda, M.D.     JH/MEDQ  D:  02/13/2006  T:  02/15/2006  Job:  [id number]

## 2011-07-05 ENCOUNTER — Encounter: Payer: Self-pay | Admitting: Cardiology

## 2011-08-21 LAB — BASIC METABOLIC PANEL
BUN: 18
CO2: 25
Calcium: 8.9
Chloride: 106
Creatinine, Ser: 1.26
GFR calc Af Amer: >60
GFR calc non Af Amer: 57 — ABNORMAL LOW
Glucose, Bld: 99
Potassium: 4.7
Sodium: 139

## 2011-10-14 ENCOUNTER — Other Ambulatory Visit: Payer: Self-pay | Admitting: *Deleted

## 2011-10-14 MED ORDER — METOPROLOL SUCCINATE ER 50 MG PO TB24: 50.0000 mg | ORAL_TABLET | Freq: Every day | ORAL | Status: DC

## 2012-01-20 ENCOUNTER — Other Ambulatory Visit: Payer: Self-pay | Admitting: *Deleted

## 2012-01-20 MED ORDER — LISINOPRIL 5 MG PO TABS: 5.0000 mg | ORAL_TABLET | Freq: Every day | ORAL | Status: DC

## 2012-04-22 ENCOUNTER — Other Ambulatory Visit: Payer: Self-pay | Admitting: *Deleted

## 2012-04-22 DIAGNOSIS — J069 Acute upper respiratory infection, unspecified: Secondary | ICD-10-CM | POA: Diagnosis not present

## 2012-04-22 MED ORDER — HYDROCHLOROTHIAZIDE 25 MG PO TABS: 25.0000 mg | ORAL_TABLET | Freq: Every day | ORAL | Status: DC

## 2012-04-22 MED ORDER — SIMVASTATIN 20 MG PO TABS: 20.0000 mg | ORAL_TABLET | Freq: Every day | ORAL | Status: DC

## 2012-04-22 MED ORDER — METOPROLOL SUCCINATE ER 50 MG PO TB24: 50.0000 mg | ORAL_TABLET | Freq: Every day | ORAL | Status: DC

## 2012-04-28 DIAGNOSIS — H16329 Diffuse interstitial keratitis, unspecified eye: Secondary | ICD-10-CM | POA: Diagnosis not present

## 2012-04-28 DIAGNOSIS — Z79899 Other long term (current) drug therapy: Secondary | ICD-10-CM | POA: Diagnosis not present

## 2012-04-28 DIAGNOSIS — M818 Other osteoporosis without current pathological fracture: Secondary | ICD-10-CM | POA: Diagnosis not present

## 2012-10-27 ENCOUNTER — Other Ambulatory Visit: Payer: Self-pay | Admitting: Rheumatology

## 2012-10-27 DIAGNOSIS — H16329 Diffuse interstitial keratitis, unspecified eye: Secondary | ICD-10-CM | POA: Diagnosis not present

## 2012-10-27 DIAGNOSIS — IMO0002 Reserved for concepts with insufficient information to code with codable children: Secondary | ICD-10-CM | POA: Diagnosis not present

## 2012-10-27 DIAGNOSIS — M545 Low back pain: Secondary | ICD-10-CM

## 2012-10-27 DIAGNOSIS — M818 Other osteoporosis without current pathological fracture: Secondary | ICD-10-CM | POA: Diagnosis not present

## 2012-11-09 ENCOUNTER — Ambulatory Visit
Admission: RE | Admit: 2012-11-09 | Discharge: 2012-11-09 | Disposition: A | Discharge status: Home / Self Care | Payer: Medicare Other | Source: Ambulatory Visit | Attending: Rheumatology | Admitting: Rheumatology

## 2012-11-09 DIAGNOSIS — M545 Low back pain: Secondary | ICD-10-CM

## 2012-11-09 DIAGNOSIS — M47817 Spondylosis without myelopathy or radiculopathy, lumbosacral region: Secondary | ICD-10-CM | POA: Diagnosis not present

## 2012-11-16 ENCOUNTER — Other Ambulatory Visit: Payer: Self-pay

## 2012-11-16 MED ORDER — LISINOPRIL 5 MG PO TABS: 5.0000 mg | ORAL_TABLET | Freq: Every day | ORAL | Status: DC

## 2012-11-16 NOTE — Telephone Encounter (Signed)
..   Requested Prescriptions   Signed Prescriptions Disp Refills  . lisinopril (PRINIVIL,ZESTRIL) 5 MG tablet 90 tablet 0    Sig: Take 1 tablet (5 mg total) by mouth daily.    Authorizing Provider: Rollene Rotunda    Ordering User: Christella Hartigan, Kreed Kauffman Judie Petit

## 2012-11-25 DIAGNOSIS — D1803 Hemangioma of intra-abdominal structures: Secondary | ICD-10-CM

## 2012-11-25 DIAGNOSIS — K8020 Calculus of gallbladder without cholecystitis without obstruction: Secondary | ICD-10-CM

## 2012-11-25 HISTORY — DX: Calculus of gallbladder without cholecystitis without obstruction: K80.20

## 2012-11-25 HISTORY — DX: Hemangioma of intra-abdominal structures: D18.03

## 2012-12-16 ENCOUNTER — Ambulatory Visit (INDEPENDENT_AMBULATORY_CARE_PROVIDER_SITE_OTHER): Payer: Medicare Other | Admitting: Cardiology

## 2012-12-16 ENCOUNTER — Encounter: Payer: Self-pay | Admitting: Cardiology

## 2012-12-16 VITALS — BP 170/90 | HR 60 | Ht 69.0 in | Wt 213.0 lb

## 2012-12-16 DIAGNOSIS — R0989 Other specified symptoms and signs involving the circulatory and respiratory systems: Secondary | ICD-10-CM | POA: Diagnosis not present

## 2012-12-16 DIAGNOSIS — E785 Hyperlipidemia, unspecified: Secondary | ICD-10-CM | POA: Diagnosis not present

## 2012-12-16 DIAGNOSIS — I2510 Atherosclerotic heart disease of native coronary artery without angina pectoris: Secondary | ICD-10-CM

## 2012-12-16 DIAGNOSIS — I10 Essential (primary) hypertension: Secondary | ICD-10-CM

## 2012-12-16 MED ORDER — AMLODIPINE BESYLATE 2.5 MG PO TABS: 2.5000 mg | ORAL_TABLET | Freq: Every day | ORAL | Status: DC

## 2012-12-16 NOTE — Patient Instructions (Addendum)
Start Norvasc 2.5 mg a day. Continue all other medications as listed.  Your physician has requested that you have a carotid duplex. This test is an ultrasound of the carotid arteries in your neck. It looks at blood flow through these arteries that supply the brain with blood. Allow one hour for this exam. There are no restrictions or special instructions.  Your physician has requested that you have a lexiscan myoview. For further information please visit [email or web address] Please follow instruction sheet, as given.  Have fasting lipid profile as day as your testing.  Follow up in 6 months with Dr Antoine Poche.  You will receive a letter in the mail 2 months before you are due.  Please call us when you receive this letter to schedule your follow up appointment.

## 2012-12-16 NOTE — Progress Notes (Signed)
HPI The patient presents for followup of known coronary disease. In December of last year he did start to notice some chest discomfort when he walked in his yard. His blood pressure had been going and so on his own he increased his lisinopril to 5 mg twice daily. He since has had no further discomfort though his blood pressures have been running high. He's not having any resting complaints. He's not describing any new PND or orthopnea. He's not having any palpitations, presyncope or syncope. He is able to be somewhat active working on his tractor but he doesn't exercise routinely.  No Known Allergies  Current Outpatient Prescriptions  Medication Sig Dispense Refill  . aspirin 81 MG tablet Take 81 mg by mouth daily.        . Calcium Carbonate-Vitamin D (CALCIUM-CARB 600 + D) 600-125 MG-UNIT TABS Take 1 tablet by mouth daily.        . hydrochlorothiazide (HYDRODIURIL) 25 MG tablet Take 1 tablet (25 mg total) by mouth daily.  90 tablet  3  . Ibuprofen-Diphenhydramine HCl (ADVIL PM) 200-25 MG CAPS Take 1 capsule by mouth at bedtime.        Marland Kitchen lisinopril (PRINIVIL,ZESTRIL) 5 MG tablet Take 1 tablet (5 mg total) by mouth daily.  90 tablet  0  . metoprolol succinate (TOPROL-XL) 50 MG 24 hr tablet Take 1 tablet (50 mg total) by mouth daily. Take 50 mg by mouth daily.  90 tablet  3  . Multiple Vitamin (MULTIVITAMIN) tablet Take 1 tablet by mouth daily.        . NON FORMULARY z quill at bedtime      . omeprazole (PRILOSEC) 20 MG capsule Take 20 mg by mouth daily.        . predniSONE (DELTASONE) 1 MG tablet Take 3 mg by mouth daily.        . ranitidine (ZANTAC) 75 MG tablet Take 75 mg by mouth 2 (two) times daily.        . silodosin (RAPAFLO) 8 MG CAPS capsule Take 8 mg by mouth daily with breakfast.        . simvastatin (ZOCOR) 20 MG tablet Take 1 tablet (20 mg total) by mouth at bedtime.  90 tablet  3    Past Medical History  Diagnosis Date  . CAD (coronary artery disease)   . Amputation of lower  limb     Right  . Necrotizing fasciitis   . Cogan syndrome   . Hypertension   . Peptic ulcer disease   . Dyslipidemia     Past Surgical History  Procedure Date  . Leg amputation 1999    Right lower extremity  . Coronary artery bypass graft 1999    LIMA to the LAD, saphenous vein graft to obtuse marginal 1 and obtuse marginal 2, spahenous vein graft to intermediate, spahenous vein graft to the right coronary artery in 1999    ROS:  As stated in the HPI and negative for all other systems.  PHYSICAL EXAM BP 170/90  Pulse 60  Ht 5\' 9"  (1.753 m)  Wt 213 lb (96.616 kg)  BMI 31.45 kg/m2 GENERAL:  Well appearing HEENT:  Pupils equal round and reactive, fundi not visualized, oral mucosa unremarkable NECK:  No jugular venous distention, waveform within normal limits, carotid upstroke brisk and symmetric, soft bilateral carotid bruits bruits, no thyromegaly LYMPHATICS:  No cervical, inguinal adenopathy LUNGS:  Clear to auscultation bilaterally BACK:  No CVA tenderness CHEST:  Well healed sternotomy scar.  HEART:  PMI not displaced or sustained,S1 and S2 within normal limits, no S3, no S4, no clicks, no rubs, soft early peaking apical systolic murmur heard also at the right upper sternal border murmurs ABD:  Flat, positive bowel sounds normal in frequency in pitch, no bruits, no rebound, no guarding, no midline pulsatile mass, no hepatomegaly, no splenomegaly EXT:  2 plus pulses throughout, no edema, no cyanosis no clubbing, status post amputation right leg SKIN:  No rashes no nodules NEURO:  Cranial nerves II through XII grossly intact, motor grossly intact throughout PSYCH:  Cognitively intact, oriented to person place and time   EKG:  Sinus rhythm, rate 60, borderline interventricular conduction delay, left axis deviation, poor anterior R wave progression, old anteroseptal MI.  ASSESSMENT AND PLAN  CAD Given the chest discomfort above and the fact that his bypass grafts are almost  71 years old stress testing is indicated. He wouldn't be a walk on a treadmill so she will have a [employer].  Dyslipidemia He's not had a lipid profile in some time and I will order this.  HTN I will add Norvasc 2.5 mg and he will continue the lisinopril twice a day which he changed himself.  Carotid stenosis He is overdue for carotid Doppler. I will arrange this.

## 2012-12-21 ENCOUNTER — Encounter (INDEPENDENT_AMBULATORY_CARE_PROVIDER_SITE_OTHER): Payer: Medicare Other

## 2012-12-21 DIAGNOSIS — R0989 Other specified symptoms and signs involving the circulatory and respiratory systems: Secondary | ICD-10-CM

## 2012-12-21 DIAGNOSIS — I6529 Occlusion and stenosis of unspecified carotid artery: Secondary | ICD-10-CM | POA: Diagnosis not present

## 2012-12-22 ENCOUNTER — Encounter: Payer: Self-pay | Admitting: Cardiology

## 2012-12-23 ENCOUNTER — Encounter (HOSPITAL_COMMUNITY): Payer: Medicare Other

## 2012-12-23 ENCOUNTER — Other Ambulatory Visit: Payer: Medicare Other

## 2012-12-30 ENCOUNTER — Ambulatory Visit (HOSPITAL_COMMUNITY): Payer: Medicare Other | Attending: Cardiology | Admitting: Radiology

## 2012-12-30 ENCOUNTER — Ambulatory Visit (INDEPENDENT_AMBULATORY_CARE_PROVIDER_SITE_OTHER): Payer: Medicare Other | Admitting: *Deleted

## 2012-12-30 VITALS — BP 178/81 | Ht 69.0 in | Wt 214.0 lb

## 2012-12-30 DIAGNOSIS — R0609 Other forms of dyspnea: Secondary | ICD-10-CM | POA: Insufficient documentation

## 2012-12-30 DIAGNOSIS — R079 Chest pain, unspecified: Secondary | ICD-10-CM

## 2012-12-30 DIAGNOSIS — R0602 Shortness of breath: Secondary | ICD-10-CM

## 2012-12-30 DIAGNOSIS — E785 Hyperlipidemia, unspecified: Secondary | ICD-10-CM | POA: Diagnosis not present

## 2012-12-30 DIAGNOSIS — I10 Essential (primary) hypertension: Secondary | ICD-10-CM | POA: Insufficient documentation

## 2012-12-30 DIAGNOSIS — I2510 Atherosclerotic heart disease of native coronary artery without angina pectoris: Secondary | ICD-10-CM | POA: Diagnosis not present

## 2012-12-30 DIAGNOSIS — R55 Syncope and collapse: Secondary | ICD-10-CM | POA: Diagnosis not present

## 2012-12-30 DIAGNOSIS — R0989 Other specified symptoms and signs involving the circulatory and respiratory systems: Secondary | ICD-10-CM | POA: Insufficient documentation

## 2012-12-30 LAB — LIPID PANEL
Total CHOL/HDL Ratio: 4
Triglycerides: 127 mg/dL (ref 0.0–149.0)

## 2012-12-30 MED ORDER — REGADENOSON 0.4 MG/5ML IV SOLN
0.4000 mg | Freq: Once | INTRAVENOUS | Status: AC
  Administered 2012-12-30: 0.4 mg via INTRAVENOUS

## 2012-12-30 MED ORDER — TECHNETIUM TC 99M SESTAMIBI GENERIC - CARDIOLITE
10.0000 | Freq: Once | INTRAVENOUS | Status: AC | PRN
  Administered 2012-12-30: 10 via INTRAVENOUS

## 2012-12-30 MED ORDER — TECHNETIUM TC 99M SESTAMIBI GENERIC - CARDIOLITE
30.0000 | Freq: Once | INTRAVENOUS | Status: AC | PRN
  Administered 2012-12-30: 30 via INTRAVENOUS

## 2012-12-30 NOTE — Progress Notes (Signed)
[facility] SITE 3 NUCLEAR MED [address] [place], [place] [address] [phone]    Cardiology Nuclear Med Study  [name] Cook is a 72 y.o. male     MRN : [id number]     DOB: [birth year]  Procedure Date: 12/30/2012  Nuclear Med Background Indication for Stress Test:  Evaluation for Ischemia and Graft Patency History: '99 Cath> CABG x 3, '09 Myocardial Perfusion Study-Normal with mildly decreased counts due to diaphragmatic attenuation, EF=70% Cardiac Risk Factors: Carotid Disease, Family History - CAD, History of Smoking, Hypertension and Lipids  Symptoms: Chest Pain with/without exertion (last occurrence 12-16-12),  Dizziness, DOE and Near Syncope   Nuclear Pre-Procedure Caffeine/Decaff Intake:  None > 12 hrs NPO After: 7:30pm   Lungs:  clear O2 Sat: 95-98% on room air. IV 0.9% NS with Angio Cath:  22g  IV Site: R Antecubital x 1, tolerated well IV Started by:  Irean Hong, RN  Chest Size (in):  44 Cup Size: n/a  Height: 5\' 9"  (1.753 m)  Weight:  214 lb (97.07 kg)  BMI:  Body mass index is 31.60 kg/(m^2). Tech Comments:  Patient took Toprol this am    Nuclear Med Study 1 or 2 day study: 1 day  Stress Test Type:  Lexiscan  Reading MD: Olga Millers, MD  Order Authorizing Provider:  Rollene Rotunda, MD  Resting Radionuclide: Technetium 23m Sestamibi  Resting Radionuclide Dose: 11.0 mCi   Stress Radionuclide:  Technetium 32m Sestamibi  Stress Radionuclide Dose: 33.0 mCi           Stress Protocol Rest HR: 59 Stress HR: 82  Rest BP: 178/81 Stress BP: 180/82  Exercise Time (min): n/a METS: n/a   Predicted Max HR: 149 bpm % Max HR: 55.03 bpm Rate Pressure Product: 21308    Dose of Adenosine (mg):  n/a Dose of Lexiscan: 0.4 mg  Dose of Atropine (mg): n/a Dose of Dobutamine: n/a mcg/kg/min (at max HR)  Stress Test Technologist: Irean Hong, RN  Nuclear Technologist:  Domenic Polite, CNMT     Rest Procedure:  Myocardial perfusion imaging was performed  at rest 45 minutes following the intravenous administration of Technetium 36m Sestamibi. Rest ECG: NSR, first degree AV block, anterior MI  Stress Procedure:  The patient received IV Lexiscan 0.4 mg over 15-seconds.  Technetium 96m Sestamibi injected at 30-seconds. The patient complained of headache, but denied chest pain. Quantitative spect images were obtained after a 45 minute delay. Stress ECG: No significant ST segment change suggestive of ischemia.  QPS Raw Data Images:  Acquisition technically good; normal left ventricular size. Stress Images:  Normal homogeneous uptake in all areas of the myocardium. Rest Images:  Normal homogeneous uptake in all areas of the myocardium. Subtraction (SDS):  No evidence of ischemia. Transient Ischemic Dilatation (Normal <1.22):  1.13 Lung/Heart Ratio (Normal <0.45):  0.39  Quantitative Gated Spect Images QGS EDV:  114 ml QGS ESV:  43 ml  Impression Exercise Capacity:  Lexiscan with no exercise. BP Response:  Normal blood pressure response. Clinical Symptoms:  No chest pain or dyspnea ECG Impression:  No significant ST segment change suggestive of ischemia. Comparison with Prior Nuclear Study: No images to compare  Overall Impression:  Normal stress nuclear study.  LV Ejection Fraction: 62%.  LV Wall Motion:  NL LV Function; NL Wall Motion  Olga Millers

## 2013-01-06 DIAGNOSIS — I749 Embolism and thrombosis of unspecified artery: Secondary | ICD-10-CM | POA: Diagnosis not present

## 2013-01-06 DIAGNOSIS — M25559 Pain in unspecified hip: Secondary | ICD-10-CM | POA: Diagnosis not present

## 2013-01-06 DIAGNOSIS — H16329 Diffuse interstitial keratitis, unspecified eye: Secondary | ICD-10-CM | POA: Diagnosis not present

## 2013-01-20 ENCOUNTER — Telehealth: Payer: Self-pay | Admitting: Cardiology

## 2013-01-20 ENCOUNTER — Encounter: Payer: Self-pay | Admitting: Cardiology

## 2013-01-20 DIAGNOSIS — M25559 Pain in unspecified hip: Secondary | ICD-10-CM | POA: Diagnosis not present

## 2013-01-20 DIAGNOSIS — I10 Essential (primary) hypertension: Secondary | ICD-10-CM

## 2013-01-20 DIAGNOSIS — Z79899 Other long term (current) drug therapy: Secondary | ICD-10-CM | POA: Diagnosis not present

## 2013-01-20 MED ORDER — ATORVASTATIN CALCIUM 40 MG PO TABS: 40.0000 mg | ORAL_TABLET | Freq: Every day | ORAL | Status: DC

## 2013-01-20 NOTE — Telephone Encounter (Signed)
Pt rtn call to Pam from yesterday to get test resutls

## 2013-01-20 NOTE — Telephone Encounter (Signed)
Spoke to patient and his wife with results. Patient is already taking Zocor. Wants to know if he is to stop Zocor.  Lipitor ordered and routed to pharmacy. Advised patient that we would forward question to MD/RN for followup and return phone call with further information. Lab results were routed to Paulita Cradle, NP, as an fyi.

## 2013-01-20 NOTE — Telephone Encounter (Signed)
Addendum to below note: Patient will come in for labs (lipid/liver) on 03/10/13 at 1:00pm.

## 2013-01-21 NOTE — Telephone Encounter (Signed)
Pt and wife aware of orders.  He will stop zocor and start lipitor.  He will repeat lab as scheduled and see Dr Antoine Poche back in 05/2013 in [place].

## 2013-02-05 ENCOUNTER — Other Ambulatory Visit (HOSPITAL_COMMUNITY): Payer: Self-pay | Admitting: Rheumatology

## 2013-02-09 ENCOUNTER — Ambulatory Visit (HOSPITAL_COMMUNITY)
Admission: RE | Admit: 2013-02-09 | Discharge: 2013-02-09 | Disposition: A | Discharge status: Home / Self Care | Payer: Medicare Other | Source: Ambulatory Visit | Attending: Rheumatology | Admitting: Rheumatology

## 2013-02-09 ENCOUNTER — Other Ambulatory Visit (HOSPITAL_COMMUNITY): Payer: Self-pay | Admitting: Rheumatology

## 2013-02-09 DIAGNOSIS — I739 Peripheral vascular disease, unspecified: Secondary | ICD-10-CM | POA: Diagnosis not present

## 2013-02-09 DIAGNOSIS — K7689 Other specified diseases of liver: Secondary | ICD-10-CM | POA: Insufficient documentation

## 2013-02-09 DIAGNOSIS — I708 Atherosclerosis of other arteries: Secondary | ICD-10-CM | POA: Diagnosis not present

## 2013-02-09 DIAGNOSIS — K8020 Calculus of gallbladder without cholecystitis without obstruction: Secondary | ICD-10-CM | POA: Diagnosis not present

## 2013-02-09 DIAGNOSIS — N323 Diverticulum of bladder: Secondary | ICD-10-CM | POA: Insufficient documentation

## 2013-02-09 MED ORDER — IOHEXOL 350 MG/ML SOLN
150.0000 mL | Freq: Once | INTRAVENOUS | Status: AC | PRN
  Administered 2013-02-09: 150 mL via INTRAVENOUS

## 2013-02-12 ENCOUNTER — Other Ambulatory Visit: Payer: Self-pay

## 2013-02-12 DIAGNOSIS — I70219 Atherosclerosis of native arteries of extremities with intermittent claudication, unspecified extremity: Secondary | ICD-10-CM

## 2013-02-15 ENCOUNTER — Telehealth: Payer: Self-pay | Admitting: Physician Assistant

## 2013-02-15 NOTE — Telephone Encounter (Signed)
Duplicate message. 

## 2013-02-15 NOTE — Telephone Encounter (Signed)
Calling back again about cat scan done at [place] last Tuesday. Dr Kellie Simmering says there is something on his liver and he needs a referral. Please call.

## 2013-02-15 NOTE — Telephone Encounter (Signed)
Patient went for a catscan on 02/09/13 and Dr. Kellie Simmering stated that the patient has a lesion on his liver that he needs to see someone about. The patient's wife is calling to get a referral to whatever doctor would see him for that lesion.

## 2013-02-16 ENCOUNTER — Other Ambulatory Visit: Payer: Self-pay | Admitting: Physician Assistant

## 2013-02-16 ENCOUNTER — Encounter: Payer: Self-pay | Admitting: Internal Medicine

## 2013-02-16 DIAGNOSIS — K769 Liver disease, unspecified: Secondary | ICD-10-CM

## 2013-02-16 NOTE — Telephone Encounter (Signed)
Referred internal Gastroenterology re Hepatic Lesion

## 2013-02-16 NOTE — Telephone Encounter (Signed)
Patient 's referral done and letter mailed to patient with his appt for GI  DAB

## 2013-02-16 NOTE — Telephone Encounter (Signed)
Left message for patient to return phone call.  He needs to be notified that GI referral has been ordered and that the referral department will contact him with his appointment.

## 2013-02-16 NOTE — Telephone Encounter (Signed)
Please call today about cat scan report from [place]. Will not be available from 12 to 2 today.

## 2013-02-16 NOTE — Progress Notes (Signed)
Referral went through and patient has been notified.

## 2013-02-19 ENCOUNTER — Other Ambulatory Visit (HOSPITAL_COMMUNITY): Payer: Self-pay | Admitting: Rheumatology

## 2013-02-19 DIAGNOSIS — K769 Liver disease, unspecified: Secondary | ICD-10-CM

## 2013-02-23 ENCOUNTER — Ambulatory Visit (HOSPITAL_COMMUNITY)
Admission: RE | Admit: 2013-02-23 | Discharge: 2013-02-23 | Disposition: A | Discharge status: Home / Self Care | Payer: Medicare Other | Source: Ambulatory Visit | Attending: Rheumatology | Admitting: Rheumatology

## 2013-02-23 ENCOUNTER — Other Ambulatory Visit (HOSPITAL_COMMUNITY): Payer: Self-pay | Admitting: Rheumatology

## 2013-02-23 DIAGNOSIS — D1809 Hemangioma of other sites: Secondary | ICD-10-CM | POA: Diagnosis not present

## 2013-02-23 DIAGNOSIS — K8020 Calculus of gallbladder without cholecystitis without obstruction: Secondary | ICD-10-CM | POA: Insufficient documentation

## 2013-02-23 DIAGNOSIS — K769 Liver disease, unspecified: Secondary | ICD-10-CM

## 2013-02-23 DIAGNOSIS — K7689 Other specified diseases of liver: Secondary | ICD-10-CM | POA: Insufficient documentation

## 2013-02-23 MED ORDER — GADOBENATE DIMEGLUMINE 529 MG/ML IV SOLN
20.0000 mL | Freq: Once | INTRAVENOUS | Status: AC | PRN
  Administered 2013-02-23: 20 mL via INTRAVENOUS

## 2013-02-24 ENCOUNTER — Encounter: Payer: Medicare Other | Admitting: Vascular Surgery

## 2013-03-02 ENCOUNTER — Encounter: Payer: Self-pay | Admitting: Vascular Surgery

## 2013-03-02 ENCOUNTER — Other Ambulatory Visit: Payer: Self-pay | Admitting: *Deleted

## 2013-03-02 MED ORDER — LISINOPRIL 5 MG PO TABS: 5.0000 mg | ORAL_TABLET | Freq: Every day | ORAL | Status: DC

## 2013-03-02 MED ORDER — HYDROCHLOROTHIAZIDE 25 MG PO TABS: 25.0000 mg | ORAL_TABLET | Freq: Every day | ORAL | Status: DC

## 2013-03-03 ENCOUNTER — Other Ambulatory Visit: Payer: Self-pay | Admitting: *Deleted

## 2013-03-03 ENCOUNTER — Encounter: Payer: Self-pay | Admitting: Vascular Surgery

## 2013-03-03 ENCOUNTER — Ambulatory Visit (INDEPENDENT_AMBULATORY_CARE_PROVIDER_SITE_OTHER): Payer: Medicare Other | Admitting: Vascular Surgery

## 2013-03-03 ENCOUNTER — Encounter (INDEPENDENT_AMBULATORY_CARE_PROVIDER_SITE_OTHER): Payer: Medicare Other | Admitting: *Deleted

## 2013-03-03 VITALS — BP 167/73 | HR 61 | Ht 69.0 in | Wt 211.8 lb

## 2013-03-03 DIAGNOSIS — I70219 Atherosclerosis of native arteries of extremities with intermittent claudication, unspecified extremity: Secondary | ICD-10-CM

## 2013-03-03 DIAGNOSIS — I739 Peripheral vascular disease, unspecified: Secondary | ICD-10-CM

## 2013-03-03 NOTE — Assessment & Plan Note (Signed)
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 His claudication symptoms are stable. I did explain that I do think his hip pain is related to his aortoiliac occlusive disease. We have discussed the importance of getting on a daily structured walking program. Fortunately he is not a smoker. His diabetes and hypertension are followed closely by his medical doctors. Also discussed the importance of nutrition. I also explained that if his symptoms were significantly disabling we could consider arteriography in order to determine if he is a candidate for iliac angioplasty and stenting. The arteries are significantly calcified which would make angioplasty slightly more risky, however if his symptoms were disabling I think this would be reasonable. He feels that his symptoms are tolerable and is willing to try a conservative approach. We'll see him back in 9 months with follow up ABIs. He knows to call sooner if he has problems.

## 2013-03-03 NOTE — Progress Notes (Signed)
[facility]  Patient name: Eirik Schueler MRN: [id number] DOB: May 03, 1941 Sex: male  REASON FOR CONSULT: aortoiliac occlusive disease- referred by Dr. Kellie Simmering  HPI: Samir Ishaq is a 72 y.o. male gives a six-month history of bilateral lower extremity claudication. He experiences pain in both hips which is brought on by ambulation and relieved with rest. She's occurs when he is walking uphill. He did lose his right leg after necrotizing fasciitis developed from a vein harvest site after open-heart surgery back in 1999. He is ambulatory with a prosthesis. His claudication symptoms over the last 6 months have been stable. He denies any history of rest pain or history of nonhealing ulcers in the right foot.  I have reviewed his CT scan which was done which showed a chronic aortic dissection with no flow limiting disease in the aorta. He does have a proximal right common iliac artery stenosis and calcific disease in the left iliac artery. 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 SMA and IMA were patent.  He denies any history of postprandial abdominal pain or weight loss.  Past Medical History  Diagnosis Date  . CAD (coronary artery disease)   . Amputation of lower limb     Right  . Necrotizing fasciitis   . Cogan syndrome   . Hypertension   . Peptic ulcer disease   . Dyslipidemia   . Diabetes mellitus without complication     Family History  Problem Relation Age of Onset  . Coronary artery disease Neg Hx   . Hyperlipidemia Mother   . Hypertension Mother   . Other Mother     varicose veins  . Diabetes Sister     SOCIAL HISTORY: History  Substance Use Topics  . Smoking status: Former Smoker    Quit date: 11/25/1997  . Smokeless tobacco: Not on file  . Alcohol Use: 8.4 oz/week    14 Shots of liquor per week    No Known Allergies  Current Outpatient Prescriptions  Medication Sig Dispense Refill  . amLODipine (NORVASC) 2.5 MG  tablet Take 1 tablet (2.5 mg total) by mouth daily.  90 tablet  3  . aspirin 81 MG tablet Take 81 mg by mouth daily.        Marland Kitchen atorvastatin (LIPITOR) 40 MG tablet Take 1 tablet (40 mg total) by mouth daily.  90 tablet  3  . Calcium Carbonate-Vitamin D (CALCIUM-CARB 600 + D) 600-125 MG-UNIT TABS Take 1 tablet by mouth daily.        . hydrochlorothiazide (HYDRODIURIL) 25 MG tablet Take 1 tablet (25 mg total) by mouth daily.  90 tablet  1  . Ibuprofen (ADVIL PO) Take by mouth as needed.      Marland Kitchen lisinopril (PRINIVIL,ZESTRIL) 5 MG tablet Take 1 tablet (5 mg total) by mouth daily.  90 tablet  1  . metFORMIN (GLUCOPHAGE) 500 MG tablet Take 1 tablet by mouth daily.      . metoprolol succinate (TOPROL-XL) 50 MG 24 hr tablet Take 1 tablet (50 mg total) by mouth daily. Take 50 mg by mouth daily.  90 tablet  3  . NON FORMULARY z quill at bedtime      . Omeprazole (PRILOSEC PO) Take 1 tablet by mouth as needed.      Marland Kitchen omeprazole (PRILOSEC) 20 MG capsule Take 20 mg by mouth daily.        . predniSONE (DELTASONE) 1 MG tablet Take 3 mg by mouth daily.       Marland Kitchen  silodosin (RAPAFLO) 8 MG CAPS capsule Take 8 mg by mouth daily with breakfast.        . Ibuprofen-Diphenhydramine HCl (ADVIL PM) 200-25 MG CAPS Take 1 capsule by mouth at bedtime.        . Multiple Vitamin (MULTIVITAMIN) tablet Take 1 tablet by mouth daily.        . ranitidine (ZANTAC) 75 MG tablet Take 75 mg by mouth 2 (two) times daily.        . simvastatin (ZOCOR) 20 MG tablet Take 1 tablet (20 mg total) by mouth at bedtime.  90 tablet  3   No current facility-administered medications for this visit.    REVIEW OF SYSTEMS: Arly.[email or web address] ] denotes positive finding; [  ] denotes negative finding  CARDIOVASCULAR:  [ ]  chest pain   [ ]  chest pressure   [ ]  palpitations   [ ]  orthopnea   Arly.Keller ] dyspnea on exertion   Arly.Keller ] claudication   [ ]  rest pain   [ ]  DVT   [ ]  phlebitis PULMONARY:   [ ]  productive cough   [ ]  asthma   [ ]  wheezing NEUROLOGIC:   [ ]  weakness  [ ]   paresthesias  [ ]  aphasia  [ ]  amaurosis  [ ]  dizziness HEMATOLOGIC:   [ ]  bleeding problems   [ ]  clotting disorders MUSCULOSKELETAL:  [ ]  joint pain   [ ]  joint swelling Arly.[email or web address] ] leg swelling GASTROINTESTINAL: [ ]   blood in stool  [ ]   hematemesis GENITOURINARY:  Arly.Keller ]  dysuria  [ ]   hematuria PSYCHIATRIC:  [ ]  history of major depression INTEGUMENTARY:  [ ]  rashes  [ ]  ulcers CONSTITUTIONAL:  [ ]  fever   [ ]  chills  PHYSICAL EXAM: Filed Vitals:   03/03/13 0908  BP: 167/73  Pulse: 61  Height: 5\' 9"  (1.753 m)  Weight: 211 lb 12.8 oz (96.072 kg)  SpO2: 96%   Body mass index is 31.26 kg/(m^2). GENERAL: The patient is a well-nourished male, in no acute distress. The vital signs are documented above. CARDIOVASCULAR: There is a regular rate and rhythm. Do not detect carotid bruits. He has palpable but slightly diminished femoral pulses. He has a palpable but slightly diminished left popliteal left dorsalis pedis and left posterior tibial pulse. PULMONARY: There is good air exchange bilaterally without wheezing or rales. ABDOMEN: Soft and non-tender with normal pitched bowel sounds.  MUSCULOSKELETAL: There are no major deformities or cyanosis. NEUROLOGIC: No focal weakness or paresthesias are detected. SKIN: There are no ulcers or rashes noted. PSYCHIATRIC: The patient has a normal affect.  DATA:  CT scan results are discussed above. In addition he was noted to have a small liver lesion and MRI subsequently showed that this was a benign hepatic hemangioma.  I have independently interpreted his arterial Doppler study in our office today which shows a triphasic posterior tibial and dorsalis pedis signal with an ABI of 100% on the left. He has a toe pressure on the left 164 mmHg.  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 His claudication symptoms are stable. I did explain that I do think his hip  pain is related to his aortoiliac occlusive disease. We have discussed the importance of getting on a daily structured walking program. Fortunately he is not a smoker. His diabetes and hypertension are followed closely by his medical doctors. Also discussed the importance of nutrition.  I also explained that if his symptoms were significantly disabling we could consider arteriography in order to determine if he is a candidate for iliac angioplasty and stenting. The arteries are significantly calcified which would make angioplasty slightly more risky, however if his symptoms were disabling I think this would be reasonable. He feels that his symptoms are tolerable and is willing to try a conservative approach. We'll see him back in 9 months with follow up ABIs. He knows to call sooner if he has problems.   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 The superior mesenteric artery and inferior mesenteric artery widely patent. No further workup is needed for this.   Nguyen Todorov S [facility] [place] [place] [facility]: [phone]

## 2013-03-04 NOTE — Addendum Note (Signed)
Addended by: Adria Dill L on: 03/04/2013 10:06 AM   Modules accepted: Orders

## 2013-03-05 ENCOUNTER — Encounter: Payer: Self-pay | Admitting: Internal Medicine

## 2013-03-10 ENCOUNTER — Other Ambulatory Visit (INDEPENDENT_AMBULATORY_CARE_PROVIDER_SITE_OTHER): Payer: Medicare Other

## 2013-03-10 DIAGNOSIS — I10 Essential (primary) hypertension: Secondary | ICD-10-CM

## 2013-03-10 LAB — HEPATIC FUNCTION PANEL
ALT: 27 U/L (ref 0–53)
Total Bilirubin: 0.9 mg/dL (ref 0.3–1.2)
Total Protein: 7.6 g/dL (ref 6.0–8.3)

## 2013-03-10 LAB — LIPID PANEL
HDL: 40.4 mg/dL (ref >39.00)
VLDL: 31.2 mg/dL (ref 0.0–40.0)

## 2013-03-15 ENCOUNTER — Encounter: Payer: Self-pay | Admitting: Internal Medicine

## 2013-03-15 ENCOUNTER — Ambulatory Visit (INDEPENDENT_AMBULATORY_CARE_PROVIDER_SITE_OTHER): Payer: Medicare Other | Admitting: Internal Medicine

## 2013-03-15 VITALS — BP 158/76 | HR 76 | Ht 68.0 in | Wt 215.2 lb

## 2013-03-15 DIAGNOSIS — Z1211 Encounter for screening for malignant neoplasm of colon: Secondary | ICD-10-CM

## 2013-03-15 DIAGNOSIS — K8020 Calculus of gallbladder without cholecystitis without obstruction: Secondary | ICD-10-CM

## 2013-03-15 DIAGNOSIS — R198 Other specified symptoms and signs involving the digestive system and abdomen: Secondary | ICD-10-CM

## 2013-03-15 DIAGNOSIS — R933 Abnormal findings on diagnostic imaging of other parts of digestive tract: Secondary | ICD-10-CM

## 2013-03-15 DIAGNOSIS — R194 Change in bowel habit: Secondary | ICD-10-CM

## 2013-03-15 DIAGNOSIS — K219 Gastro-esophageal reflux disease without esophagitis: Secondary | ICD-10-CM | POA: Diagnosis not present

## 2013-03-15 DIAGNOSIS — I739 Peripheral vascular disease, unspecified: Secondary | ICD-10-CM

## 2013-03-15 DIAGNOSIS — D1803 Hemangioma of intra-abdominal structures: Secondary | ICD-10-CM

## 2013-03-15 MED ORDER — SIMETHICONE 80 MG PO CHEW: 80.0000 mg | CHEWABLE_TABLET | Freq: Four times a day (QID) | ORAL | Status: DC | PRN

## 2013-03-15 MED ORDER — PEG-KCL-NACL-NASULF-NA ASC-C 100 G PO SOLR: 1.0000 | Freq: Once | ORAL | Status: DC

## 2013-03-15 NOTE — Patient Instructions (Addendum)
You have been scheduled for a colonoscopy with propofol. Please follow written instructions given to you at your visit today.  Please pick up your prep kit at the pharmacy within the next 1-3 days. If you use inhalers (even only as needed), please bring them with you on the day of your procedure. Your physician has requested that you go to [email or web address] and enter the access code given to you at your visit today. This web site gives a general overview about your procedure. However, you should still follow specific instructions given to you by our office regarding your preparation for the procedure.  We have sent the following medications to your pharmacy for you to pick up at your convenience: Moviprep; you can get Gas-X chewable tabs over the counter and take as needed  You have been given a low bloating diet                                                 We are excited to introduce MyChart, a new best-in-class service that provides you online access to important information in your electronic medical record. We want to make it easier for you to view your health information - all in one secure location - when and where you need it. We expect MyChart will enhance the quality of care and service we provide.  When you register for MyChart, you can:    View your test results.    Request appointments and receive appointment reminders via email.    Request medication renewals.    View your medical history, allergies, medications and immunizations.    Communicate with your physician's office through a password-protected site.    Conveniently print information such as your medication lists.  To find out if MyChart is right for you, please talk to a member of our clinical staff today. We will gladly answer your questions about this free health and wellness tool.  If you are age 50 or older and want a member of your family to have access to your record, you must provide written consent by  completing a proxy form available at our office. Please speak to our clinical staff about guidelines regarding accounts for patients younger than age 78.  As you activate your MyChart account and need any technical assistance, please call the MyChart technical support line at [phone]) or email your question to [email or web address]. If you email your question(s), please include your name, a return phone number and the best time to reach you.  If you have non-urgent health-related questions, you can send a message to our office through MyChart at [email or web address]. If you have a medical emergency, call 911.  Thank you for using MyChart as your new health and wellness resource!   MyChart licensed from [employer],  [id number]. Patents Pending.

## 2013-03-15 NOTE — Progress Notes (Signed)
Patient ID: [name] Cook, male   DOB: June 11, 1941, 72 y.o.   MRN: [id number] HPI: [name] Cook is a 72 year old male past medical history of CAD, peripheral vascular disease, hypertension, diabetes, GERD and kidney stones who is seen in consultation at the request of Dr. Kellie Simmering for evaluation of abnormal GI imaging.  He is accompanied today by his wife. He reports that he has been evaluated initially for burning pain in his hips by rheumatology. Rheumatologic joint disease was excluded and he was referred for evaluation of peripheral vascular disease. Reportedly he does have claudication symptoms and also bilateral iliac artery occlusive disease. In workup for his peripheral vascular disease abdominal imaging showed hepatic cyst. He subsequent had an MRI to further evaluate his liver. He denies any specific liver complaints including abnormal LFTs. He denies jaundice, family history of liver disease, ascites or lower extremity edema. He does report over the last 6 months a change in bowel habit. His stools have been smaller but more frequent and occasionally loose and explosive. Part of this year to have one formed stool per day. He denies blood in his stool or melena. He has been started on metformin within the past 6 weeks, but he reports his change in bowel habits predate this medication. Does have a history of GERD which is well treated with omeprazole. He denies dysphagia or diet dysphagia. He does report abdominal bloating which tends to be worse after eating and as the day wears on. This is not associated with abdominal pain. He reports a good appetite with no nausea or vomiting. At times his abdominal distention is notable to his wife. He also reports occasional urgent stools and fecal urgency which has led to intermittent episodes of incontinence. He is always aware of these episodes, but simply does not have enough time to reach the bathroom. He has never had a colonoscopy.  Past Medical History   Diagnosis Date  . CAD (coronary artery disease)   . Amputation of lower limb     Right  . Necrotizing fasciitis   . Cogan syndrome   . Hypertension   . Peptic ulcer disease   . Dyslipidemia   . Diabetes mellitus without complication   . Arthritis   . Gallstones   . GERD (gastroesophageal reflux disease)   . Kidney stones     Past Surgical History  Procedure Laterality Date  . 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 Coronary artery bypass graft  1999    LIMA to the LAD, saphenous vein graft to obtuse marginal 1 and obtuse marginal 2, spahenous vein graft to intermediate, spahenous vein graft to the right coronary artery in 1999  . Appendectomy  2007    Current Outpatient Prescriptions  Medication Sig Dispense Refill  . amLODipine (NORVASC) 2.5 MG tablet Take 1 tablet (2.5 mg total) by mouth daily.  90 tablet  3  . aspirin 81 MG tablet Take 81 mg by mouth daily.        Marland Kitchen atorvastatin (LIPITOR) 40 MG tablet Take 1 tablet (40 mg total) by mouth daily.  90 tablet  3  . Calcium Carbonate-Vitamin D (CALCIUM-CARB 600 + D) 600-125 MG-UNIT TABS Take 1 tablet by mouth daily.        . hydrochlorothiazide (HYDRODIURIL) 25 MG tablet Take 1 tablet (25 mg total) by mouth daily.  90 tablet  1  . Ibuprofen (ADVIL PO) Take by mouth as needed.      . Ibuprofen-Diphenhydramine HCl (ADVIL PM)  200-25 MG CAPS Take 1 capsule by mouth at bedtime.        Marland Kitchen lisinopril (PRINIVIL,ZESTRIL) 5 MG tablet Take 1 tablet (5 mg total) by mouth daily.  90 tablet  1  . metFORMIN (GLUCOPHAGE) 500 MG tablet Take 1 tablet by mouth daily.      . metoprolol succinate (TOPROL-XL) 50 MG 24 hr tablet Take 50 mg by mouth daily.      . Multiple Vitamin (MULTIVITAMIN) tablet Take 1 tablet by mouth daily.        . NON FORMULARY z quill at bedtime      . omeprazole (PRILOSEC) 20 MG capsule Take 20 mg by mouth daily.        . predniSONE (DELTASONE) 1 MG tablet Take 3 mg by mouth daily.       . ranitidine (ZANTAC) 75 MG tablet  Take 75 mg by mouth 2 (two) times daily.        . silodosin (RAPAFLO) 8 MG CAPS capsule Take 8 mg by mouth daily with breakfast.        . peg 3350 powder (MOVIPREP) 100 G SOLR Take 1 kit (100 g total) by mouth once.  1 kit  0  . simethicone (GAS-X) 80 MG chewable tablet Chew 1 tablet (80 mg total) by mouth every 6 (six) hours as needed for flatulence.  30 tablet  0   No current facility-administered medications for this visit.    No Known Allergies  Family History  Problem Relation Age of Onset  . Coronary artery disease Neg Hx   . Hyperlipidemia Mother   . Hypertension Mother   . Other Mother     varicose veins  . Diabetes Sister   . Heart disease Mother   . Heart disease Maternal Grandfather   . Rheum arthritis Father   . Heart attack Father     History  Substance Use Topics  . Smoking status: Former Smoker    Types: Cigarettes    Quit date: 11/25/1997  . Smokeless tobacco: Never Used  . Alcohol Use: 8.4 oz/week    14 Shots of liquor per week    ROS: As per history of present illness, otherwise negative  BP 158/76  Pulse 76  Ht 5\' 8"  (1.727 m)  Wt 215 lb 4 oz (97.637 kg)  BMI 32.74 kg/m2 Constitutional: Well-developed and well-nourished. No distress. HEENT: Normocephalic and atraumatic. Oropharynx is clear and moist. No oropharyngeal exudate. Conjunctivae are normal.  No scleral icterus. Neck: Neck supple. Trachea midline. Cardiovascular: Normal rate, regular rhythm and intact distal pulses. No M/R/G Pulmonary/chest: Effort normal and breath sounds normal. No wheezing, rales or rhonchi. Abdominal: Soft, nontender, mild distended with mild increase in tympany. Bowel sounds active throughout. There are no masses palpable. Extremities: no clubbing, cyanosis, or edema, right lower extremity BKA Lymphadenopathy: No cervical adenopathy noted. Neurological: Alert and oriented to person place and time. Skin: Skin is warm and dry. No rashes noted. Psychiatric: Normal mood  and affect. Behavior is normal.  RELEVANT LABS AND IMAGING:  CMP     Component Value Date/Time   NA 139 04/11/2008 0753   K 4.7 04/11/2008 0753   CL 106 04/11/2008 0753   CO2 25 04/11/2008 0753   GLUCOSE 99 04/11/2008 0753   BUN 18 04/11/2008 0753   CREATININE 1.26 04/11/2008 0753   CALCIUM 8.9 04/11/2008 0753   PROT 7.6 03/10/2013 1256   ALBUMIN 4.2 03/10/2013 1256   AST 26 03/10/2013 1256   ALT 27  03/10/2013 1256   ALKPHOS 31* 03/10/2013 1256   BILITOT 0.9 03/10/2013 1256   GFRNONAA 57* 04/11/2008 0753   GFRAA  Value: >60        The eGFR has been calculated using the MDRD equation. This calculation has not been validated in all clinical 04/11/2008 0753   CT ANGIOGRAPHY OF ABDOMINAL AORTA WITH ILIOFEMORAL RUNOFF  Technique:  Multidetector CT imaging of the abdomen, pelvis and lower extremities was performed using the standard protocol during bolus administration of intravenous contrast.  Multiplanar CT image reconstructions including MIPs were obtained to evaluate the vascular anatomy.   Contrast: OMNIPAQUE IOHEXOL 350 MG/ML SOLN   Comparison:   None.   Findings:   The suprarenal in juxtarenal aorta is patent and nonaneurysmal with mild atherosclerotic changes.  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  It does not appear flow limiting.   There is approximately 60% stenosis at the origin of the celiac axis.  Branch vessels are grossly patent.   Mild atherosclerotic changes at the origin of the SMA without significant focal narrowing.  Mild plaque in the mid SMA without significant narrowing.  Branch vessels are grossly patent.   Moderate disease at the origin of the IMA.  It is diminutive and grossly patent.  Branch vessels are patent.   Single renal arteries are patent.   Atherosclerotic changes at the origin of the right common iliac artery are present.  Significant focal narrowing is suspected.  It is a  diminutive vessel with diffuse calcific plaque.  Similarly, there is diffuse plaque in the right internal and external iliac arteries.  No obvious focal stenosis.   Atherosclerotic changes of the left common iliac artery without significant focal narrowing.  Diffuse atherosclerotic calcifications of the left internal and external iliac arteries without focal narrowing.   He centric calcified plaque is present in the right common femoral artery.  There is significant narrowing at the origin of the right superficial femoral artery.  The profunda femoral artery is patent. Mild diffuse disease of the superficial femoral artery.  Popliteal artery is patent.  Below-the-knee amputation noted.   Common femoral artery is patent with atherosclerotic calcifications.  Mild disease at the origin of the left superficial femoral artery.  Mild diffuse disease.  Left popliteal artery is patent.  Tibial vessels are mildly diffusely diseased and patent to the distal leg.  Poor filling of the anterior tibial artery and peroneal artery likely represents phase of contrast.  The posterior tibial artery is patent to the ankle.   Mild dependent atelectasis bilaterally.   Diffuse hepatic steatosis.  Indeterminate 1.6 cm lesion in the lateral segment of the left lobe of the liver on image 21.  Benign appearing cyst is present in the right lobe on image 54.  There are several foci of contrast enhancement within the arterial phase of the liver which are nonspecific.   Large lamellated 2.6 cm gallstone.   Spleen, pancreas, adrenal glands are within normal limits.  Chronic changes are present in the kidneys.  Hyperdense foci within the collecting system of the kidneys are worrisome for nephrolithiasis. Indeterminate sub centimeter hypodensity in the right kidney is noted on image 55.   Bladder is distended with their evacuation of the bladder wall. The prostate is mildly prominent protruding through the  base of the bladder. The area that protrudes is somewhat hyperdense or hyper enhancing and may represent a mass.  Right posterolateral bladder diverticulum is  noted.   Left inguinal hernia contains only adipose tissue.   No destructive bone lesion.  Minimal degenerative changes in the lumbar spine.   Review of the MIP images confirms the above findings.   IMPRESSION: Focal dissection of the infrarenal aorta does not appear flow limiting and is related to atherosclerotic change.   Significant narrowing at the origin of the celiac axis.  Mild changes of the SMA without significant focal narrowing.   Significant stenosis at the origin of the right common iliac artery.  There is also significant narrowing at the origin of the right superficial femoral artery.  A right BKA is noted.   There are atherosclerotic changes in the left common and external iliac artery without significant narrowing.  Similarly, there is disease in the left superficial femoral artery without significant focal narrowing.  Three-vessel runoff is present to the distal left leg as described.   Indeterminate 1.6 cm lesion in the left lobe of the liver.  Because of the patient's age, he is at least at average risk of malignancy. MRI is recommended further characterize.  There are also a number of foci of contrast enhancement throughout the liver which are nonspecific.   Bilateral nephrolithiasis is suspected.  This may simply represent a test bolus with contrast collected in the collecting system.   Left inguinal hernia containing adipose tissue.   Cholelithiasis.   Bladder distention and trabeculation with a small diverticulum.   Prostate is prominent. A central prostate mass may be present as described above.  Correlate with PSA and physical exam.    MRI ABDOMEN WITH AND WITHOUT CONTRAST   Technique:  Multiplanar multisequence MR imaging of the abdomen was performed both before and after  administration of intravenous contrast.   Contrast: 20mL MULTIHANCE GADOBENATE DIMEGLUMINE 529 MG/ML IV SOLN   Comparison: CT 02/09/2013   Findings: Lesion of concern in the lateral aspect of the left hepatic lobe is hyperintense on T2-weighted imaging measuring 19 x 16 mm (image 22, series 3).  This lesion demonstrates delayed peripheral enhancement best on the delayed series (image 54, series 17).  This most suggestive of a benign hemangioma.  There is a second hemangioma within the posterior right hepatic lobe measuring 18 x 14 mm (image 46, series 17).  There is a simple hepatic cyst in the inferior right hepatic lobe measuring 20 mm (image 99).   No biliary dilatation.  Large gallstone in the gallbladder measures 23 mm.  The pancreas, spleen, adrenal glands, and kidneys are normal.   Abdominal aorta normal caliber.  No retroperitoneal periportal lymphadenopathy.   IMPRESSION:   1.  Small benign hepatic hemangiomas and cysts.  No worrisome hepatic lesion. 2.  Cholelithiasis.    ASSESSMENT/PLAN: 72 year old male past medical history of CAD, peripheral vascular disease, hypertension, diabetes, GERD and kidney stones who is seen in consultation at the request of Dr. Kellie Simmering for evaluation of abnormal GI imaging.   1.  Abnormal GI imaging/liver lesion/hemangioma/liver cyst -- recently the patient's LFTs were normal. The MRI was reviewed and reveals 2 hemangiomas and one simple cyst. These lesions are felt to be benign in nature, with no concerning radiologic features.  The patient has no specific complaints, at this point given their benign nature no repeat imaging or further workup as felt necessary.  2.  Cholelithiasis -- asymptomatic at this point, we discussed this in detail. I do not think he needs cholecystectomy unless symptoms warrant. He understands this recommendation  3.  Change in bowel habits/abdominal  bloating -- given the patient's age and change in bowel habits,  and the fact that he has never had a screening colonoscopy, I recommend a colonoscopy today. We discussed the tests including the risks and benefits and he is agreeable to proceed. It is possible that metformin is contributing to some of his change in bowel habits, but he noted a change before this medication started. Diabetes may also part in some of his GI complaint. Anoscopy will help document no polyps or other concerning colonic pathology. I have recommended a low gas diet and will give him a prescription for simethicone to be used as needed and as directed for bloating. If colonoscopy is normal and he has not improved much, we could consider a probiotic.  4.  GERD -- well-controlled on omeprazole daily without alarm symptoms. He will continue current dose of omeprazole.  5.  Peripheral vascular disease -- the patient does have claudication symptoms and is being followed by vascular surgery as well as cardiology. There is no symptoms of mesenteric ischemia at present, including no postprandial pain or weight loss.

## 2013-03-18 NOTE — Progress Notes (Signed)
lmtco

## 2013-03-25 ENCOUNTER — Encounter: Payer: Self-pay | Admitting: Internal Medicine

## 2013-03-25 ENCOUNTER — Other Ambulatory Visit: Payer: Self-pay | Admitting: Internal Medicine

## 2013-03-25 ENCOUNTER — Ambulatory Visit (AMBULATORY_SURGERY_CENTER): Payer: Medicare Other | Admitting: Internal Medicine

## 2013-03-25 VITALS — BP 184/78 | HR 53 | Temp 98.7°F | Resp 18 | Ht 68.0 in | Wt 215.0 lb

## 2013-03-25 DIAGNOSIS — R198 Other specified symptoms and signs involving the digestive system and abdomen: Secondary | ICD-10-CM

## 2013-03-25 DIAGNOSIS — Z1211 Encounter for screening for malignant neoplasm of colon: Secondary | ICD-10-CM

## 2013-03-25 DIAGNOSIS — I10 Essential (primary) hypertension: Secondary | ICD-10-CM | POA: Diagnosis not present

## 2013-03-25 DIAGNOSIS — D126 Benign neoplasm of colon, unspecified: Secondary | ICD-10-CM

## 2013-03-25 DIAGNOSIS — I2510 Atherosclerotic heart disease of native coronary artery without angina pectoris: Secondary | ICD-10-CM | POA: Diagnosis not present

## 2013-03-25 DIAGNOSIS — K219 Gastro-esophageal reflux disease without esophagitis: Secondary | ICD-10-CM | POA: Diagnosis not present

## 2013-03-25 DIAGNOSIS — E119 Type 2 diabetes mellitus without complications: Secondary | ICD-10-CM | POA: Diagnosis not present

## 2013-03-25 DIAGNOSIS — R194 Change in bowel habit: Secondary | ICD-10-CM

## 2013-03-25 LAB — GLUCOSE, CAPILLARY: Glucose-Capillary: 105 mg/dL — ABNORMAL HIGH (ref 70–99)

## 2013-03-25 MED ORDER — SODIUM CHLORIDE 0.9 % IV SOLN: 500.0000 mL | INTRAVENOUS | Status: DC

## 2013-03-25 NOTE — Progress Notes (Signed)
Called to room to assist during endoscopic procedure.  Patient ID and intended procedure confirmed with present staff. Received instructions for my participation in the procedure from the performing physician.  

## 2013-03-25 NOTE — Patient Instructions (Addendum)
YOU HAD AN ENDOSCOPIC PROCEDURE TODAY AT [facility]: Refer to the procedure report that was given to you for any specific questions about what was found during the examination.  If the procedure report does not answer your questions, please call your gastroenterologist to clarify.  If you requested that your care partner not be given the details of your procedure findings, then the procedure report has been included in a sealed envelope for you to review at your convenience later.  YOU SHOULD EXPECT: Some feelings of bloating in the abdomen. Passage of more gas than usual.  Walking can help get rid of the air that was put into your GI tract during the procedure and reduce the bloating. If you had a lower endoscopy (such as a colonoscopy or flexible sigmoidoscopy) you may notice spotting of blood in your stool or on the toilet paper. If you underwent a bowel prep for your procedure, then you may not have a normal bowel movement for a few days.  DIET: Your first meal following the procedure should be a light meal and then it is ok to progress to your normal diet.  A half-sandwich or bowl of soup is an example of a good first meal.  Heavy or fried foods are harder to digest and may make you feel nauseous or bloated.  Likewise meals heavy in dairy and vegetables can cause extra gas to form and this can also increase the bloating.  Drink plenty of fluids but you should avoid alcoholic beverages for 24 hours.  ACTIVITY: Your care partner should take you home directly after the procedure.  You should plan to take it easy, moving slowly for the rest of the day.  You can resume normal activity the day after the procedure however you should NOT DRIVE or use heavy machinery for 24 hours (because of the sedation medicines used during the test).    SYMPTOMS TO REPORT IMMEDIATELY: A gastroenterologist can be reached at any hour.  During normal business hours, 8:30 AM to 5:00 PM Monday through Friday,  call [phone].  After hours and on weekends, please call the GI answering service at [phone] who will take a message and have the physician on call contact you.   Following lower endoscopy (colonoscopy or flexible sigmoidoscopy):  Excessive amounts of blood in the stool  Significant tenderness or worsening of abdominal pains  Swelling of the abdomen that is new, acute  Fever of 100F or higher s  FOLLOW UP: If any biopsies were taken you will be contacted by phone or by letter within the next 1-3 weeks.  Call your gastroenterologist if you have not heard about the biopsies in 3 weeks.  Our staff will call the home number listed on your records the next business day following your procedure to check on you and address any questions or concerns that you may have at that time regarding the information given to you following your procedure. This is a courtesy call and so if there is no answer at the home number and we have not heard from you through the emergency physician on call, we will assume that you have returned to your regular daily activities without incident.  SIGNATURES/CONFIDENTIALITY: You and/or your care partner have signed paperwork which will be entered into your electronic medical record.  These signatures attest to the fact that that the information above on your After Visit Summary has been reviewed and is understood.  Full responsibility of the confidentiality of  this discharge information lies with you and/or your care-partner.  Polyp, high fiber diet, diverticulosis and hemorrhoid information given.  Dr. Rhea Belton will advise about timing for next colonoscopy after pathology reports are reviewed.

## 2013-03-25 NOTE — Progress Notes (Signed)
Lidocaine-40mg IV prior to Propofol InductionPropofol given over incremental dosages 

## 2013-03-25 NOTE — Progress Notes (Signed)
Patient did not experience any of the following events: a burn prior to discharge; a fall within the facility; wrong site/side/patient/procedure/implant event; or a hospital transfer or hospital admission upon discharge from the facility. (G8907) Patient did not have preoperative order for IV antibiotic SSI prophylaxis. (G8918)  

## 2013-03-25 NOTE — Op Note (Signed)
[facility] [address] [place], [address]   COLONOSCOPY PROCEDURE REPORT  PATIENT: [name] Cook, [name] Cook  MR#: [id number] BIRTHDATE: 1941-09-17 , 72  yrs. old GENDER: Male ENDOSCOPIST: Beverley Fiedler, MD REFERRED [id number] Kellie Simmering, M.D. PROCEDURE DATE:  03/25/2013 PROCEDURE:   Colonoscopy with snare polypectomy and Colonoscopy with cold biopsy polypectomy ASA CLASS:   Class III INDICATIONS:average risk screening, Change in bowel habits, and first colonoscopy. MEDICATIONS: MAC sedation, administered by CRNA and propofol (Diprivan) 350mg  IV  DESCRIPTION OF PROCEDURE:   After the risks benefits and alternatives of the procedure were thoroughly explained, informed consent was obtained.  A digital rectal exam revealed no rectal mass.   The LB [id number]  endoscope was introduced through the anus and advanced to the cecum, which was identified by both the appendix and ileocecal valve. No adverse events experienced. The quality of the prep was good, using MoviPrep  The instrument was then slowly withdrawn as the colon was fully examined.    COLON FINDINGS: Two sessile polyps measuring 3-4 mm in size were found in the ascending colon.  Polypectomy was performed with cold forceps.  All resections were complete and all polyp tissue was completely retrieved.   A sessile polyp measuring 5 mm in size was found in the transverse colon.  A polypectomy was performed with a cold snare.  The resection was complete and the polyp tissue was completely retrieved.   Seven sessile polyps measuring 3-6 mm in size were found in the sigmoid colon, rectosigmoid colon, and rectum.  Polypectomy was performed using cold snare.  All resections were complete and all polyp tissue was completely retrieved.   Moderate diverticulosis was noted in the descending colon and sigmoid colon.   Small internal and external hemorrhoids were found.  Retroflexed views revealed  internal/external hemorrhoids. The time to cecum=4 minutes 00 seconds.  Withdrawal time=15 minutes 48 seconds.  The scope was withdrawn and the procedure completed.  COMPLICATIONS: There were no complications.  ENDOSCOPIC IMPRESSION: 1.   Two sessile polyps measuring 3-4 mm in size were found in the ascending colon; Polypectomy was performed with cold forceps 2.   Sessile polyp measuring 5 mm in size was found in the transverse colon; polypectomy was performed with a cold snare 3.   Seven sessile polyps measuring 3-6 mm in size were found in the sigmoid colon, rectosigmoid colon, and rectum; Polypectomy was performed using cold snare 4.   Moderate diverticulosis was noted in the descending colon and sigmoid colon 5.   Small internal and external hemorrhoids    RECOMMENDATIONS: 1.  Await pathology results 2.  High fiber diet 3.  Timing of repeat colonoscopy will be determined by pathology findings. 4.  You will receive a letter within 1-2 weeks with the results of your biopsy as well as final recommendations.  Please call my office if you have not received a letter after 3 weeks.   eSigned:  Beverley Fiedler, MD 03/25/2013 3:35 PM   cc: The Patient   PATIENT NAME:  [name] Cook, [name] Cook MR#: [id number]

## 2013-03-26 ENCOUNTER — Telehealth: Payer: Self-pay | Admitting: *Deleted

## 2013-03-26 NOTE — Telephone Encounter (Signed)
  Follow up Call-  Call back number 03/25/2013  Post procedure Call Back phone  # [phone]  Permission to leave phone message Yes     Patient questions:  Do you have a fever, pain , or abdominal swelling? no Pain Score  0 *  Have you tolerated food without any problems? yes  Have you been able to return to your normal activities? yes  Do you have any questions about your discharge instructions: Diet   no Medications  no Follow up visit  no  Do you have questions or concerns about your Care? no  Actions: * If pain score is 4 or above: No action needed, pain <4.

## 2013-03-30 ENCOUNTER — Encounter: Payer: Self-pay | Admitting: Internal Medicine

## 2013-03-31 DIAGNOSIS — I749 Embolism and thrombosis of unspecified artery: Secondary | ICD-10-CM | POA: Diagnosis not present

## 2013-03-31 DIAGNOSIS — H16329 Diffuse interstitial keratitis, unspecified eye: Secondary | ICD-10-CM | POA: Diagnosis not present

## 2013-04-21 ENCOUNTER — Encounter: Payer: Self-pay | Admitting: Cardiology

## 2013-04-21 ENCOUNTER — Ambulatory Visit (INDEPENDENT_AMBULATORY_CARE_PROVIDER_SITE_OTHER): Payer: Medicare Other | Admitting: Cardiology

## 2013-04-21 VITALS — BP 194/92 | HR 71 | Ht 68.0 in | Wt 212.2 lb

## 2013-04-21 DIAGNOSIS — I2510 Atherosclerotic heart disease of native coronary artery without angina pectoris: Secondary | ICD-10-CM

## 2013-04-21 DIAGNOSIS — R0989 Other specified symptoms and signs involving the circulatory and respiratory systems: Secondary | ICD-10-CM | POA: Diagnosis not present

## 2013-04-21 DIAGNOSIS — I739 Peripheral vascular disease, unspecified: Secondary | ICD-10-CM

## 2013-04-21 MED ORDER — METOPROLOL SUCCINATE ER 50 MG PO TB24: 50.0000 mg | ORAL_TABLET | Freq: Every day | ORAL | Status: DC

## 2013-04-21 MED ORDER — LISINOPRIL 5 MG PO TABS: 5.0000 mg | ORAL_TABLET | Freq: Two times a day (BID) | ORAL | Status: DC

## 2013-04-21 NOTE — Progress Notes (Signed)
HPI The patient presents for followup of known coronary disease. Since I last saw him he's had no new chest pressure, neck or arm discomfort. He is being followed by Dr. Edilia Bo for lower extremity vascular disease. He has been put on a walking regimen. At the last visit I added Norvasc to his regimen as his blood pressure was not controlled. He had doubled his lisinopril on his own prior to that visit and I had said he could continue this twice a day. However, he seems to have misunderstood and has only been taking it once a day. Of note his blood pressure since then in the 140s to 160s at home systolic.He's not having any new PND or orthopnea. He's not having any palpitations, presyncope or syncope.   No Known Allergies  Current Outpatient Prescriptions  Medication Sig Dispense Refill  . amLODipine (NORVASC) 2.5 MG tablet Take 1 tablet (2.5 mg total) by mouth daily.  90 tablet  3  . aspirin 81 MG tablet Take 81 mg by mouth daily.        Marland Kitchen atorvastatin (LIPITOR) 40 MG tablet Take 1 tablet (40 mg total) by mouth daily.  90 tablet  3  . Calcium Carbonate-Vitamin D (CALCIUM-CARB 600 + D) 600-125 MG-UNIT TABS Take 1 tablet by mouth daily.        . hydrochlorothiazide (HYDRODIURIL) 25 MG tablet Take 1 tablet (25 mg total) by mouth daily.  90 tablet  1  . Ibuprofen (ADVIL PO) Take by mouth as needed.      Marland Kitchen lisinopril (PRINIVIL,ZESTRIL) 5 MG tablet Take 1 tablet (5 mg total) by mouth daily.  90 tablet  1  . metFORMIN (GLUCOPHAGE) 500 MG tablet Take 1 tablet by mouth daily.      . metoprolol succinate (TOPROL-XL) 50 MG 24 hr tablet Take 50 mg by mouth daily.      . Multiple Vitamin (MULTIVITAMIN) tablet Take 1 tablet by mouth daily.        . NON FORMULARY z quill at bedtime      . omeprazole (PRILOSEC) 20 MG capsule Take 20 mg by mouth daily.        . predniSONE (DELTASONE) 1 MG tablet Take 3 mg by mouth daily.       . Probiotic Product (RESTORA PO) Take by mouth.      . ranitidine (ZANTAC) 75 MG  tablet Take 75 mg by mouth 2 (two) times daily.        . silodosin (RAPAFLO) 8 MG CAPS capsule Take 8 mg by mouth daily with breakfast.        . simethicone (GAS-X) 80 MG chewable tablet Chew 1 tablet (80 mg total) by mouth every 6 (six) hours as needed for flatulence.  30 tablet  0   No current facility-administered medications for this visit.    Past Medical History  Diagnosis Date  . CAD (coronary artery disease)   . Amputation of lower limb     Right  . Necrotizing fasciitis   . Cogan syndrome   . Hypertension   . Peptic ulcer disease   . Dyslipidemia   . Diabetes mellitus without complication   . Arthritis   . Gallstones   . GERD (gastroesophageal reflux disease)   . Kidney stones     Past Surgical History  Procedure Laterality Date  . 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 Coronary artery bypass graft  1999    LIMA to the LAD, saphenous  vein graft to obtuse marginal 1 and obtuse marginal 2, spahenous vein graft to intermediate, spahenous vein graft to the right coronary artery in 1999  . Appendectomy  2007    ROS:  As stated in the HPI and negative for all other systems.  PHYSICAL EXAM BP 194/92  Pulse 71  Ht 5\' 8"  (1.727 m)  Wt 212 lb 3.2 oz (96.253 kg)  BMI 32.27 kg/m2 GENERAL:  Well appearing HEENT:  Pupils equal round and reactive, fundi not visualized, oral mucosa unremarkable NECK:  No jugular venous distention, waveform within normal limits, carotid upstroke brisk and symmetric, soft bilateral carotid bruits bruits, no thyromegaly LYMPHATICS:  No cervical, inguinal adenopathy LUNGS:  Clear to auscultation bilaterally BACK:  No CVA tenderness CHEST:  Well healed sternotomy scar. HEART:  PMI not displaced or sustained,S1 and S2 within normal limits, no S3, no S4, no clicks, no rubs, soft early peaking apical systolic murmur heard also at the right upper sternal border murmurs ABD:  Flat, positive bowel sounds normal in frequency in pitch, no bruits, no  rebound, no guarding, no midline pulsatile mass, no hepatomegaly, no splenomegaly EXT:  2 plus pulses throughout, no edema, no cyanosis no clubbing, status post amputation right leg SKIN:  No rashes no nodules NEURO:  Cranial nerves II through XII grossly intact, motor grossly intact throughout PSYCH:  Cognitively intact, oriented to person place and time   EKG:  Sinus rhythm, rate 60, borderline interventricular conduction delay, left axis deviation, poor anterior R wave progression, old anteroseptal MI.  ASSESSMENT AND PLAN  CAD He has had no new symptoms since a negative stress perfusion study earlier this year. No change in therapy is indicated.  Dyslipidemia His lipid profile earlier this year was excellent and he will continue the meds as listed.  HTN I will continue Norvasc 2.5 mg.  However, I have clarified that he used to be taking his lisinopril and he will start doing this twice daily. We will keep a blood pressure diary.  Carotid stenosis He has left 60-79% stenosis and we will follow this again this summer.

## 2013-04-21 NOTE — Patient Instructions (Addendum)
Please increase your Lisinopril to twice a day Continue all other medications as listed  Follow up in 6 months with Dr Antoine Poche.  You will receive a letter in the mail 2 months before you are due.  Please call us when you receive this letter to schedule your follow up appointment.

## 2013-04-26 ENCOUNTER — Telehealth: Payer: Self-pay | Admitting: Nurse Practitioner

## 2013-04-27 ENCOUNTER — Other Ambulatory Visit: Payer: Self-pay

## 2013-04-27 MED ORDER — METFORMIN HCL 500 MG PO TABS: 500.0000 mg | ORAL_TABLET | Freq: Every day | ORAL | Status: DC

## 2013-04-28 ENCOUNTER — Ambulatory Visit (INDEPENDENT_AMBULATORY_CARE_PROVIDER_SITE_OTHER): Payer: Medicare Other

## 2013-04-28 ENCOUNTER — Other Ambulatory Visit: Payer: Self-pay | Admitting: Cardiology

## 2013-04-28 ENCOUNTER — Telehealth: Payer: Self-pay | Admitting: Family Medicine

## 2013-04-28 ENCOUNTER — Ambulatory Visit (INDEPENDENT_AMBULATORY_CARE_PROVIDER_SITE_OTHER): Payer: Medicare Other | Admitting: Family Medicine

## 2013-04-28 ENCOUNTER — Encounter: Payer: Self-pay | Admitting: Family Medicine

## 2013-04-28 ENCOUNTER — Other Ambulatory Visit: Payer: Self-pay | Admitting: Physician Assistant

## 2013-04-28 VITALS — BP 150/63 | HR 67 | Temp 97.1°F | Ht 68.0 in | Wt 210.6 lb

## 2013-04-28 DIAGNOSIS — R066 Hiccough: Secondary | ICD-10-CM | POA: Diagnosis not present

## 2013-04-28 DIAGNOSIS — R05 Cough: Secondary | ICD-10-CM

## 2013-04-28 DIAGNOSIS — R0989 Other specified symptoms and signs involving the circulatory and respiratory systems: Secondary | ICD-10-CM

## 2013-04-28 LAB — POCT CBC
Hemoglobin: 13.8 g/dL — AB (ref 14.1–18.1)
MCH, POC: 32.6 pg — AB (ref 27–31.2)
MPV: 7.7 fL (ref 0–99.8)
POC Granulocyte: 6.4 (ref 2–6.9)
POC LYMPH PERCENT: 16.9 %L (ref 10–50)
RBC: 4.2 M/uL — AB (ref 4.69–6.13)

## 2013-04-28 MED ORDER — AMOXICILLIN 500 MG PO CAPS: 500.0000 mg | ORAL_CAPSULE | Freq: Three times a day (TID) | ORAL | Status: DC

## 2013-04-28 MED ORDER — HALOPERIDOL 0.5 MG PO TABS: 0.5000 mg | ORAL_TABLET | Freq: Three times a day (TID) | ORAL | Status: DC

## 2013-04-28 NOTE — Progress Notes (Signed)
Subjective:    Patient ID: [name] Cook, male    DOB: 10/10/1941, 72 y.o.   MRN: [id number]  HPI Patient comes in today complaining with chest congestion and head congestion with cough for 2 weeks. He is felt warm to touch by his wife. No documented fever. His sputum has been alternated between yellow and clear. He has some wheezes occasionally. He saw Dr. Antoine Poche last week and was given an okay and good report. He developed hiccups about a week ago, and these have persisted making him more fatigued and tired in addition to having the chest congestion and coughing.  Review of Systems  Constitutional: Positive for fatigue. Negative for fever.  HENT: Positive for congestion (head and chest), sore throat (slight) and sinus pressure. Negative for ear pain.   Respiratory: Positive for cough (prod, thick green ). Negative for shortness of breath.   Psychiatric/Behavioral: Positive for sleep disturbance (due to cough and hiccups).       Objective:   Physical Exam BP 150/63  Pulse 67  Temp(Src) 97.1 F (36.2 C) (Oral)  Ht 5\' 8"  (1.727 m)  Wt 210 lb 9.6 oz (95.528 kg)  BMI 32.03 kg/m2  The patient appeared well nourished and normally developed, alert and oriented to time and place. Speech, behavior and judgement appear normal. Vital signs as documented.  Head exam is unremarkable. No scleral icterus or pallor noted. Right TM is somewhat secured by a external auditory canal cerumen. The left TM is normal. There is some nasal congestion.  Neck is without jugular venous distension,or thyromegally.There are carotid bruits left greater than right. Carotid upstrokes are brisk bilaterally. No cervical adenopathy. Lungs are clear anteriorly and posteriorly to auscultation. Normal respiratory effort. Cardiac exam reveals regular rate and rhythm 72 per minute. First and second heart sounds normal.  No  rubs or gallops. He has a grade 2/6 systolic ejection murmur. Abdominal exam reveals normal bowl  sounds, no masses, no organomegaly and no aortic enlargement. No inguinal adenopathy. He does have a lot of gas on palpation. Extremities are nonedematous and both femoral and pedal pulses are normal. He has a prosthesis below the right knee. Skin without pallor or jaundice.  Warm and clammy, without rash.  [facility] reading (PRIMARY) by  Dr. Christell Constant; chest x-ray, cardiac bypass. Minimal heart enlargement.                                 Results for orders placed in visit on 04/28/13  POCT CBC      Result Value Range   WBC 8.0  4.6 - 10.2 K/uL   Lymph, poc 1.4  0.6 - 3.4   POC LYMPH PERCENT 16.9  10 - 50 %L   POC Granulocyte 6.4  2 - 6.9   Granulocyte percent 79.8  37 - 80 %G   RBC 4.2 (*) 4.69 - 6.13 M/uL   Hemoglobin 13.8 (*) 14.1 - 18.1 g/dL   HCT, POC 11.9 (*) 14.7 - 53.7 %   MCV 95.9  80 - 97 fL   MCH, POC 32.6 (*) 27 - 31.2 pg   MCHC 34.0  31.8 - 35.4 g/dL   RDW, POC 82.9     Platelet Count, POC 244.0  142 - 424 K/uL   MPV 7.7  0 - 99.8 fL         Assessment & Plan:   1. Cough - DG Chest 2 View; Future -  POCT CBC  2. Chest congestion - DG Chest 2 View; Future - POCT CBC -Z-Pak  3. Intractable hiccups -Haldol 0.5 mg every 6-8 hours #12 will be called into the drugstore  Patient Instructions  Take medications as directed Drink plenty of fluids Take plain Mucinex maximum strength over-the-counter one twice a day with a large glass of water

## 2013-04-28 NOTE — Patient Instructions (Addendum)
Take medications as directed Drink plenty of fluids Take plain Mucinex maximum strength over-the-counter one twice a day with a large glass of water

## 2013-04-28 NOTE — Telephone Encounter (Signed)
Appt given for today 

## 2013-04-29 NOTE — Telephone Encounter (Signed)
..   Requested Prescriptions   Pending Prescriptions Disp Refills  . amLODipine (NORVASC) 2.5 MG tablet [Pharmacy Med Name: AMLODIPINE 2.5 MG   TAB CAMB] 30 tablet 9    Sig: TAKE 1 TABLET (2.5 MG TOTAL)  BY MOUTH DAILY.

## 2013-05-03 ENCOUNTER — Telehealth: Payer: Self-pay | Admitting: *Deleted

## 2013-05-03 NOTE — Telephone Encounter (Signed)
Continued hiccups, med did not work Please call to advise

## 2013-05-03 NOTE — Telephone Encounter (Signed)
Retry medication but with a higher dose

## 2013-05-04 ENCOUNTER — Telehealth: Payer: Self-pay | Admitting: Family Medicine

## 2013-05-04 ENCOUNTER — Other Ambulatory Visit: Payer: Self-pay | Admitting: *Deleted

## 2013-05-04 DIAGNOSIS — R066 Hiccough: Secondary | ICD-10-CM

## 2013-05-04 MED ORDER — HALOPERIDOL 1 MG PO TABS: 1.0000 mg | ORAL_TABLET | Freq: Three times a day (TID) | ORAL | Status: DC

## 2013-05-04 NOTE — Progress Notes (Signed)
Pt notified to increase Haldol to 1mg  TID for hiccups If no better in 2-3 days please call back

## 2013-05-04 NOTE — Telephone Encounter (Signed)
Still has the hiccups they are no better please return call after you talk to the pharmacist

## 2013-05-19 ENCOUNTER — Encounter: Payer: Self-pay | Admitting: Internal Medicine

## 2013-05-20 ENCOUNTER — Encounter: Payer: Self-pay | Admitting: Internal Medicine

## 2013-05-20 ENCOUNTER — Ambulatory Visit (INDEPENDENT_AMBULATORY_CARE_PROVIDER_SITE_OTHER): Payer: Medicare Other | Admitting: Internal Medicine

## 2013-05-20 VITALS — BP 176/70 | HR 60 | Ht 68.0 in | Wt 205.6 lb

## 2013-05-20 DIAGNOSIS — R140 Abdominal distension (gaseous): Secondary | ICD-10-CM

## 2013-05-20 DIAGNOSIS — R3911 Hesitancy of micturition: Secondary | ICD-10-CM | POA: Diagnosis not present

## 2013-05-20 DIAGNOSIS — R159 Full incontinence of feces: Secondary | ICD-10-CM | POA: Diagnosis not present

## 2013-05-20 DIAGNOSIS — Z87448 Personal history of other diseases of urinary system: Secondary | ICD-10-CM

## 2013-05-20 DIAGNOSIS — K649 Unspecified hemorrhoids: Secondary | ICD-10-CM | POA: Diagnosis not present

## 2013-05-20 DIAGNOSIS — Z86010 Personal history of colonic polyps: Secondary | ICD-10-CM | POA: Diagnosis not present

## 2013-05-20 DIAGNOSIS — R066 Hiccough: Secondary | ICD-10-CM

## 2013-05-20 DIAGNOSIS — R141 Gas pain: Secondary | ICD-10-CM

## 2013-05-20 MED ORDER — HYOSCYAMINE SULFATE 0.125 MG SL SUBL: 0.1250 mg | SUBLINGUAL_TABLET | Freq: Three times a day (TID) | SUBLINGUAL | Status: DC

## 2013-05-20 NOTE — Progress Notes (Signed)
Subjective:    Patient ID: [name] Cook, male    DOB: 05/06/1941, 72 y.o.   MRN: [id number]  HPI Mr. Krueger is a 72 year old male past medical history of CAD, peripheral vascular disease, hypertension, diabetes, GERD and kidney stones who is seen in followup. He is here today with his wife. He was previously seen for evaluation of abnormal GI imaging and liver lesions requiring further characterization. MRI of the liver revealed benign hemangioma and simple cysts. He did not have any elevation of liver enzymes. Also that time he was having more frequent and loose stools and came for colonoscopy, his first, and this revealed multiple polyps (10 subcentimeter) removed from the ascending, transverse, sigmoid and rectosigmoid colon. Pathology revealed tubular adenomas, sessile serrated adenomas and hyperplastic polyps. He also had moderate diverticulosis in the left colon and small internal and external hemorrhoids.  Today he reports that he has improved abdominal bloating and distention after starting probiotics with Restora.  He likes this medicine so much that he is now ordering online. He reports that he is having ongoing trouble with loose stools as well as occasional fecal seepage/incontinence. He also has urinary incontinence, urinary frequency and hesitancy. He has a history of previous kidney and bladder stones. He was seen by Dr. Vernie Ammons in the past at [facility].  He denies dysuria or hematuria. He's also had no blood in his stool or melena. He has a history of hemorrhoids and he uses Anusol after every bowel movement. He also reports he spends a great deal of time walking and trying to clean his perianal skin. Of note he did start metformin in March and feels his stools have been looser since. He also has noticed that he often has scant amount of stool passed per rectum when he tries to urinate. This has prompted him to sit down to urinate.  Also he got was 3 weeks if WHICH she feels  started after a URI. They seem to be occurring every 5 seconds. Eventually he was treated with Haldol which stopped the hiccups. He is not having hiccups today. He reports a similar episode of hiccups that lasted about one month 7 years ago.  Otherwise no abdominal pain, nausea or vomiting. He denies dyspnea or chest pain.  Review of Systems As per history of present illness, otherwise negative  Current Medications, Allergies, Past Medical History, Past Surgical History, Family History and Social History were reviewed in [employer] record.     Objective:   Physical Exam BP 176/70  Pulse 60  Ht 5\' 8"  (1.727 m)  Wt 205 lb 9.6 oz (93.26 kg)  BMI 31.27 kg/m2 Constitutional: Well-developed and well-nourished. No distress. HEENT: Normocephalic and atraumatic No scleral icterus. Cardiovascular: Normal rate, regular rhythm and intact distal pulses.  Pulmonary/chest: Effort normal and breath sounds normal. No wheezing, rales or rhonchi. Abdominal: Soft, nontender, nondistended. Bowel sounds active throughout. There are no masses palpable. No hepatosplenomegaly. Extremities: no clubbing, cyanosis, or edema Neurological: Alert and oriented to person place and time. Skin: Skin is warm and dry. No rashes noted. Psychiatric: Normal mood and affect. Behavior is normal.  CBC    Component Value Date/Time   WBC 8.0 04/28/2013 1621   RBC 4.2* 04/28/2013 1621   HGB 13.8* 04/28/2013 1621   HCT 40.6* 04/28/2013 1621   MCV 95.9 04/28/2013 1621   MCH 32.6* 04/28/2013 1621   MCHC 34.0 04/28/2013 1621   CMP     Component Value Date/Time   NA 139  04/11/2008 0753   K 4.7 04/11/2008 0753   CL 106 04/11/2008 0753   CO2 25 04/11/2008 0753   GLUCOSE 99 04/11/2008 0753   BUN 18 04/11/2008 0753   CREATININE 1.26 04/11/2008 0753   CALCIUM 8.9 04/11/2008 0753   PROT 7.6 03/10/2013 1256   ALBUMIN 4.2 03/10/2013 1256   AST 26 03/10/2013 1256   ALT 27 03/10/2013 1256   ALKPHOS 31* 03/10/2013 1256    BILITOT 0.9 03/10/2013 1256   GFRNONAA 57* 04/11/2008 0753   GFRAA  Value: >60        The eGFR has been calculated using the MDRD equation. This calculation has not been validated in all clinical 04/11/2008 0753      Assessment & Plan:  72 year old male past medical history of CAD, peripheral vascular disease, hypertension, diabetes, GERD and kidney stones who is seen in followup.  1.  Fecal incontinence/hemorrhoids -- he has had a recent colonoscopy ruling out any structural abnormalities of the rectum. There was no evidence of colitis or proctitis. His stools have been loose and I think this is contributing to his fecal incontinence. We discussed a further workup to include anorectal manometry but we will try medical therapy first. I recommended that he start fiber therapy with Metamucil or Benefiber twice daily in an attempt to bulk his stool. I also would like to start him on Levsin 0.25 sublingual 3 times daily for its anticholinergic properties. From a hemorrhoid standpoint, I have asked that he stop using daily hydrocortisone as this can lead to significant perianal atrophy. I also want him to focus less on perianal hygiene and not over-wipe which can lead to more irritation. He can use Balmex or Desitin perianally PRN.  Also, recommended Tucks medicated pads/witch hazel as needed for burning/itching.  2.  Abd bloating -- improved with probiotic, he will continue Restora daily  3.  History of adenomatous colon polyps -- multiple adenomas, less than 1 cm and less than 10 in number. Current guidelines indicate repeat colorectal cancer screening/surveillance in 3 years. He is aware of this recommendation  4.  Urinary hesitancy/frequency/history of bladder and kidney stones -- have asked that he followup with Dr. Vernie Ammons to discuss the symptoms further. Medications may be available to help with his urinary symptoms. We will call to try to facilitate this appointment  5.  Hiccups -- resolved. This  returns I would consider endoscopy  I've asked that he followup with me in the office in 2-3 months.

## 2013-05-20 NOTE — Patient Instructions (Addendum)
We have sent the following medications to your pharmacy for you to pick up at your convenience: levsin; take as directed  Start taking either benifiber or metamucil; take 1 scoop daily  You can use witch hazel or tucks pad for any rectal irritation Wipe with an unscented baby wipe  Decrease Anusol   Follow up with Dr. Rhea Belton in office in 2 months    You have a follow up with [facility] on 05/25/2013 @ 2:45pm please arrive 15 minutes prior to your appointment for check in                                                We are excited to introduce MyChart, a new best-in-class service that provides you online access to important information in your electronic medical record. We want to make it easier for you to view your health information - all in one secure location - when and where you need it. We expect MyChart will enhance the quality of care and service we provide.  When you register for MyChart, you can:    View your test results.    Request appointments and receive appointment reminders via email.    Request medication renewals.    View your medical history, allergies, medications and immunizations.    Communicate with your physician's office through a password-protected site.    Conveniently print information such as your medication lists.  To find out if MyChart is right for you, please talk to a member of our clinical staff today. We will gladly answer your questions about this free health and wellness tool.  If you are age 42 or older and want a member of your family to have access to your record, you must provide written consent by completing a proxy form available at our office. Please speak to our clinical staff about guidelines regarding accounts for patients younger than age 52.  As you activate your MyChart account and need any technical assistance, please call the MyChart technical support line at [phone]) [phone] [phone]) or email your question to  [email or web address]. If you email your question(s), please include your name, a return phone number and the best time to reach you.  If you have non-urgent health-related questions, you can send a message to our office through MyChart at [email or web address]. If you have a medical emergency, call 911.  Thank you for using MyChart as your new health and wellness resource!   MyChart licensed from [employer],  4540-9811. Patents Pending.

## 2013-05-24 ENCOUNTER — Telehealth: Payer: Self-pay | Admitting: Gastroenterology

## 2013-05-24 ENCOUNTER — Other Ambulatory Visit: Payer: Self-pay | Admitting: Gastroenterology

## 2013-05-24 NOTE — Telephone Encounter (Signed)
Mailed pt an appointment reminder for his follow up with Dr. Rhea Belton in Sept.

## 2013-05-25 ENCOUNTER — Other Ambulatory Visit: Payer: Self-pay | Admitting: *Deleted

## 2013-05-25 DIAGNOSIS — N323 Diverticulum of bladder: Secondary | ICD-10-CM | POA: Diagnosis not present

## 2013-05-25 DIAGNOSIS — R339 Retention of urine, unspecified: Secondary | ICD-10-CM | POA: Diagnosis not present

## 2013-05-25 DIAGNOSIS — N401 Enlarged prostate with lower urinary tract symptoms: Secondary | ICD-10-CM | POA: Diagnosis not present

## 2013-06-01 ENCOUNTER — Emergency Department (HOSPITAL_COMMUNITY)
Admission: EM | Admit: 2013-06-01 | Discharge: 2013-06-01 | Disposition: A | Discharge status: Home / Self Care | Payer: Medicare Other | Attending: Emergency Medicine | Admitting: Emergency Medicine

## 2013-06-01 ENCOUNTER — Encounter (HOSPITAL_COMMUNITY): Payer: Self-pay

## 2013-06-01 DIAGNOSIS — E119 Type 2 diabetes mellitus without complications: Secondary | ICD-10-CM | POA: Insufficient documentation

## 2013-06-01 DIAGNOSIS — Z860100 Personal history of colon polyps, unspecified: Secondary | ICD-10-CM | POA: Insufficient documentation

## 2013-06-01 DIAGNOSIS — M129 Arthropathy, unspecified: Secondary | ICD-10-CM | POA: Diagnosis not present

## 2013-06-01 DIAGNOSIS — R110 Nausea: Secondary | ICD-10-CM | POA: Insufficient documentation

## 2013-06-01 DIAGNOSIS — Z87891 Personal history of nicotine dependence: Secondary | ICD-10-CM | POA: Diagnosis not present

## 2013-06-01 DIAGNOSIS — Z951 Presence of aortocoronary bypass graft: Secondary | ICD-10-CM | POA: Diagnosis not present

## 2013-06-01 DIAGNOSIS — E785 Hyperlipidemia, unspecified: Secondary | ICD-10-CM | POA: Insufficient documentation

## 2013-06-01 DIAGNOSIS — R5381 Other malaise: Secondary | ICD-10-CM | POA: Insufficient documentation

## 2013-06-01 DIAGNOSIS — I10 Essential (primary) hypertension: Secondary | ICD-10-CM | POA: Diagnosis not present

## 2013-06-01 DIAGNOSIS — Z8719 Personal history of other diseases of the digestive system: Secondary | ICD-10-CM | POA: Diagnosis not present

## 2013-06-01 DIAGNOSIS — Z8711 Personal history of peptic ulcer disease: Secondary | ICD-10-CM | POA: Diagnosis not present

## 2013-06-01 DIAGNOSIS — Z87442 Personal history of urinary calculi: Secondary | ICD-10-CM | POA: Insufficient documentation

## 2013-06-01 DIAGNOSIS — K219 Gastro-esophageal reflux disease without esophagitis: Secondary | ICD-10-CM | POA: Insufficient documentation

## 2013-06-01 DIAGNOSIS — R6883 Chills (without fever): Secondary | ICD-10-CM | POA: Insufficient documentation

## 2013-06-01 DIAGNOSIS — R300 Dysuria: Secondary | ICD-10-CM | POA: Insufficient documentation

## 2013-06-01 DIAGNOSIS — Z7982 Long term (current) use of aspirin: Secondary | ICD-10-CM | POA: Diagnosis not present

## 2013-06-01 DIAGNOSIS — IMO0002 Reserved for concepts with insufficient information to code with codable children: Secondary | ICD-10-CM | POA: Diagnosis not present

## 2013-06-01 DIAGNOSIS — N390 Urinary tract infection, site not specified: Secondary | ICD-10-CM | POA: Insufficient documentation

## 2013-06-01 DIAGNOSIS — R42 Dizziness and giddiness: Secondary | ICD-10-CM | POA: Diagnosis not present

## 2013-06-01 DIAGNOSIS — R339 Retention of urine, unspecified: Secondary | ICD-10-CM | POA: Diagnosis not present

## 2013-06-01 DIAGNOSIS — Z79899 Other long term (current) drug therapy: Secondary | ICD-10-CM | POA: Diagnosis not present

## 2013-06-01 DIAGNOSIS — I2510 Atherosclerotic heart disease of native coronary artery without angina pectoris: Secondary | ICD-10-CM | POA: Diagnosis not present

## 2013-06-01 LAB — CBC WITH DIFFERENTIAL/PLATELET
Basophils Absolute: 0.1 K/uL (ref 0.0–0.1)
Basophils Relative: 1 % (ref 0–1)
Eosinophils Absolute: 0.2 K/uL (ref 0.0–0.7)
Eosinophils Relative: 3 % (ref 0–5)
HCT: 37.7 % — ABNORMAL LOW (ref 39.0–52.0)
Hemoglobin: 13.3 g/dL (ref 13.0–17.0)
Lymphocytes Relative: 15 % (ref 12–46)
Lymphs Abs: 1.1 K/uL (ref 0.7–4.0)
MCH: 32.4 pg (ref 26.0–34.0)
MCHC: 35.3 g/dL (ref 30.0–36.0)
MCV: 92 fL (ref 78.0–100.0)
Monocytes Absolute: 0.9 K/uL (ref 0.1–1.0)
Monocytes Relative: 13 % — ABNORMAL HIGH (ref 3–12)
Neutro Abs: 5 K/uL (ref 1.7–7.7)
Neutrophils Relative %: 69 % (ref 43–77)
Platelets: 222 K/uL (ref 150–400)
RBC: 4.1 MIL/uL — ABNORMAL LOW (ref 4.22–5.81)
RDW: 13.6 % (ref 11.5–15.5)
WBC: 7.2 K/uL (ref 4.0–10.5)

## 2013-06-01 LAB — URINALYSIS, ROUTINE W REFLEX MICROSCOPIC
Bilirubin Urine: NEGATIVE
Nitrite: POSITIVE — AB
Specific Gravity, Urine: 1.014 (ref 1.005–1.030)
Urobilinogen, UA: 0.2 mg/dL (ref 0.0–1.0)
pH: 7 (ref 5.0–8.0)

## 2013-06-01 LAB — URINE MICROSCOPIC-ADD ON

## 2013-06-01 LAB — BASIC METABOLIC PANEL
BUN: 17 mg/dL (ref 6–23)
CO2: 29 mEq/L (ref 19–32)
Calcium: 9 mg/dL (ref 8.4–10.5)
Glucose, Bld: 108 mg/dL — ABNORMAL HIGH (ref 70–99)
Sodium: 133 mEq/L — ABNORMAL LOW (ref 135–145)

## 2013-06-01 MED ORDER — CIPROFLOXACIN HCL 500 MG PO TABS: 500.0000 mg | ORAL_TABLET | Freq: Two times a day (BID) | ORAL | Status: DC

## 2013-06-01 MED ORDER — SODIUM CHLORIDE 0.9 % IV BOLUS (SEPSIS)
1000.0000 mL | Freq: Once | INTRAVENOUS | Status: AC
  Administered 2013-06-01: 1000 mL via INTRAVENOUS

## 2013-06-01 MED ORDER — CIPROFLOXACIN IN D5W 400 MG/200ML IV SOLN
400.0000 mg | Freq: Once | INTRAVENOUS | Status: AC
  Administered 2013-06-01: 400 mg via INTRAVENOUS
  Filled 2013-06-01: qty 200

## 2013-06-01 NOTE — ED Provider Notes (Signed)
  This was a shared visit with a mid-level provided (NP or PA).  Throughout the patient's course I was available for consultation/collaboration.  I saw the ECG (if appropriate), relevant labs and studies - I agree with the interpretation.  On my exam the patient was in no distress.  He felt substantially better while here, after his evaluation.  He was appropriate for discharge with close outpatient followup.      Gerhard Munch, MD 06/01/13 2352

## 2013-06-01 NOTE — ED Notes (Signed)
Pt was seen at the Urology center earlier today to have his urinary catheter removed. Pt's BP was low at the office at 96/60 and 80/52? Staff sent him over here for BP and to check for possible dehydration. Also a concern that patient may have a urinary tract infection.

## 2013-06-01 NOTE — ED Notes (Signed)
[id number]<AV> Expected date:<BR> Expected time:<BR> Means of arrival:<BR> Comments:<BR> Hold for pt from urology

## 2013-06-01 NOTE — ED Provider Notes (Signed)
History    CSN: [id number] Arrival date & time 06/01/13  1607  First MD Initiated Contact with Patient 06/01/13 1609     Chief Complaint  Patient presents with  . Hypotension   (Consider location/radiation/quality/duration/timing/severity/associated sxs/prior Treatment) HPI  Patient is a 72 year old male past medical history significant for coronary artery disease, Cogan syndrome, hypertension, PVD, diabetes mellitus, GERD, status post right BKA presented to the emergency department from the urology Department here at [facility] today for hypotension after having a urinary catheter removed. Patient states he felt lightheaded but denies passing out or hitting his head. The urology practitioner planning on treating the patient for urinary tract infection decided to send patient over he ED for evaluation of hypotension. Patient states he had not been feeling well prior to his appointment today stating he felt very fatigued with some nausea and chills.  Past Medical History  Diagnosis Date  . CAD (coronary artery disease)   . Amputation of lower limb     Right  . Necrotizing fasciitis   . Cogan syndrome   . Hypertension   . Peptic ulcer disease   . Dyslipidemia   . Diabetes mellitus without complication   . Arthritis   . Gallstones   . GERD (gastroesophageal reflux disease)   . Kidney stones   . Colon polyps   . Hepatic hemangioma 2014  . Cholelithiasis 2014   Past Surgical History  Procedure Laterality Date  . 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 Coronary artery bypass graft  1999    LIMA to the LAD, saphenous vein graft to obtuse marginal 1 and obtuse marginal 2, spahenous vein graft to intermediate, spahenous vein graft to the right coronary artery in 1999  . Appendectomy  2007   Family History  Problem Relation Age of Onset  . Colon cancer Neg Hx   . Hyperlipidemia Mother   . Hypertension Mother   . Other Mother     varicose veins  . Diabetes Sister   . Heart disease  Mother   . Heart disease Maternal Grandfather   . Rheum arthritis Father   . Heart attack Father    History  Substance Use Topics  . Smoking status: Former Smoker    Types: Cigarettes    Start date: 12/26/1962    Quit date: 11/25/1997  . Smokeless tobacco: Never Used  . Alcohol Use: 8.4 oz/week    14 Shots of liquor per week     Comment: daily     Review of Systems  Constitutional: Positive for chills.  Respiratory: Negative for shortness of breath.   Cardiovascular: Negative for chest pain.  Gastrointestinal: Positive for nausea. Negative for abdominal pain.  Genitourinary: Positive for dysuria.  Neurological: Positive for light-headedness. Negative for syncope.  All other systems reviewed and are negative.    Allergies  Altace  Home Medications   Current Outpatient Rx  Name  Route  Sig  Dispense  Refill  . amLODipine (NORVASC) 2.5 MG tablet   Oral   Take 1 tablet (2.5 mg total) by mouth daily.   90 tablet   3   . aspirin 81 MG tablet   Oral   Take 81 mg by mouth daily.           Marland Kitchen atorvastatin (LIPITOR) 40 MG tablet   Oral   Take 1 tablet (40 mg total) by mouth daily.   90 tablet   3   . Calcium Carbonate-Vitamin D (CALCIUM-CARB 600 +  D) 600-125 MG-UNIT TABS   Oral   Take 1 tablet by mouth daily.           . hydrochlorothiazide (HYDRODIURIL) 25 MG tablet   Oral   Take 1 tablet (25 mg total) by mouth daily.   90 tablet   1   . hyoscyamine (LEVSIN SL) 0.125 MG SL tablet   Sublingual   Place 1 tablet (0.125 mg total) under the tongue 3 (three) times daily.   30 tablet   0   . Ibuprofen (ADVIL PO)   Oral   Take by mouth as needed.         Marland Kitchen lisinopril (PRINIVIL,ZESTRIL) 5 MG tablet   Oral   Take 1 tablet (5 mg total) by mouth 2 (two) times daily.   180 tablet   3   . metFORMIN (GLUCOPHAGE) 500 MG tablet   Oral   Take 1 tablet (500 mg total) by mouth daily.   90 tablet   0   . metoprolol succinate (TOPROL-XL) 50 MG 24 hr tablet    Oral   Take 1 tablet (50 mg total) by mouth daily.   90 tablet   3   . Multiple Vitamin (MULTIVITAMIN) tablet   Oral   Take 1 tablet by mouth daily.           . NON FORMULARY      z quill at bedtime         . omeprazole (PRILOSEC) 20 MG capsule   Oral   Take 20 mg by mouth daily.           . predniSONE (DELTASONE) 1 MG tablet   Oral   Take 3 mg by mouth daily.          . Probiotic Product (RESTORA PO)   Oral   Take by mouth.         . ranitidine (ZANTAC) 75 MG tablet   Oral   Take 75 mg by mouth 2 (two) times daily.           . simethicone (GAS-X) 80 MG chewable tablet   Oral   Chew 1 tablet (80 mg total) by mouth every 6 (six) hours as needed for flatulence.   30 tablet   0   . tamsulosin (FLOMAX) 0.4 MG CAPS   Oral   Take 0.4 mg by mouth.         . ciprofloxacin (CIPRO) 500 MG tablet   Oral   Take 1 tablet (500 mg total) by mouth 2 (two) times daily.   14 tablet   0    BP 133/60  Pulse 56  Temp(Src) 98.2 F (36.8 C) (Oral)  Resp 15  Ht 5\' 8"  (1.727 m)  Wt 210 lb (95.255 kg)  BMI 31.94 kg/m2  SpO2 95% Physical Exam  Constitutional: He is oriented to person, place, and time. He appears well-developed and well-nourished. No distress.  HENT:  Head: Normocephalic and atraumatic.  Mouth/Throat: Oropharynx is clear and moist.  Eyes: Conjunctivae are normal.  Neck: Neck supple.  Cardiovascular: Normal rate, regular rhythm, normal heart sounds and intact distal pulses.   Pulmonary/Chest: Effort normal and breath sounds normal.  Abdominal: Soft. Bowel sounds are normal. There is no tenderness. There is no rebound and no guarding.  Musculoskeletal: He exhibits no edema.  Neurological: He is alert and oriented to person, place, and time.  Skin: Skin is warm and dry. He is not diaphoretic.    ED Course  Procedures (including critical care time)  Medications  sodium chloride 0.9 % bolus 1,000 mL (0 mLs Intravenous Stopped 06/01/13 1834)   ciprofloxacin (CIPRO) IVPB 400 mg (400 mg Intravenous New Bag/Given 06/01/13 1906)     Labs Reviewed  URINALYSIS, ROUTINE W REFLEX MICROSCOPIC - Abnormal; Notable for the following:    APPearance TURBID (*)    Hgb urine dipstick LARGE (*)    Protein, ur 100 (*)    Nitrite POSITIVE (*)    Leukocytes, UA LARGE (*)    All other components within normal limits  CBC WITH DIFFERENTIAL - Abnormal; Notable for the following:    RBC 4.10 (*)    HCT 37.7 (*)    Monocytes Relative 13 (*)    All other components within normal limits  BASIC METABOLIC PANEL - Abnormal; Notable for the following:    Sodium 133 (*)    Chloride 95 (*)    Glucose, Bld 108 (*)    GFR calc non Af Amer 62 (*)    GFR calc Af Amer 71 (*)    All other components within normal limits  URINE MICROSCOPIC-ADD ON - Abnormal; Notable for the following:    Bacteria, UA FEW (*)    All other components within normal limits  URINE CULTURE    Date: 06/01/2013  Rate: 59  Rhythm: sinus rhythm  QRS Axis: left  Intervals: normal  ST/T Wave abnormalities: normal  Conduction Disutrbances:left bundle branch block  Narrative Interpretation:   Old EKG Reviewed: changes noted    No results found. 1. UTI (urinary tract infection)   2. Lightheadedness     MDM  Patient presenting after an episode of hypotension with lightheadedness while at the urology office today after catheter removal. Patient asymptomatic while in the ED. Labs and EKG reviewed. Patient with UTI. Patient improved after IVF. UTI will be treated with IV Cipro while in ED and will be d/c w/ Abx. There is no concern for acute emergent cause of hypotension. Advise followup with urology to discuss today's ED findings after appointment. Patient agreeable to plan. Patient d/w with Dr. Jeraldine Loots, agrees with plan. Patient is stable at time of discharge         Jeannetta Ellis, PA-C 06/01/13 2022

## 2013-06-03 LAB — URINE CULTURE: Colony Count: >=100000

## 2013-06-04 NOTE — ED Notes (Signed)
+   Urine Patient treated with Ciprofloxacin-sensitive to same-chart appended per protocol MD. 

## 2013-06-29 DIAGNOSIS — N401 Enlarged prostate with lower urinary tract symptoms: Secondary | ICD-10-CM | POA: Diagnosis not present

## 2013-06-29 DIAGNOSIS — R339 Retention of urine, unspecified: Secondary | ICD-10-CM | POA: Diagnosis not present

## 2013-06-30 ENCOUNTER — Encounter (INDEPENDENT_AMBULATORY_CARE_PROVIDER_SITE_OTHER): Payer: Medicare Other

## 2013-06-30 ENCOUNTER — Other Ambulatory Visit: Payer: Self-pay

## 2013-06-30 DIAGNOSIS — I6529 Occlusion and stenosis of unspecified carotid artery: Secondary | ICD-10-CM

## 2013-07-16 ENCOUNTER — Emergency Department (HOSPITAL_COMMUNITY): Payer: Medicare Other

## 2013-07-16 ENCOUNTER — Other Ambulatory Visit: Payer: Self-pay

## 2013-07-16 ENCOUNTER — Inpatient Hospital Stay (HOSPITAL_COMMUNITY)
Admission: EM | Admit: 2013-07-16 | Discharge: 2013-07-20 | DRG: 247 | Disposition: A | Discharge status: Home / Self Care | Payer: Medicare Other | Attending: Cardiology | Admitting: Cardiology

## 2013-07-16 ENCOUNTER — Encounter (HOSPITAL_COMMUNITY): Payer: Self-pay | Admitting: Emergency Medicine

## 2013-07-16 DIAGNOSIS — E876 Hypokalemia: Secondary | ICD-10-CM | POA: Diagnosis not present

## 2013-07-16 DIAGNOSIS — J90 Pleural effusion, not elsewhere classified: Secondary | ICD-10-CM | POA: Diagnosis not present

## 2013-07-16 DIAGNOSIS — S88119A Complete traumatic amputation at level between knee and ankle, unspecified lower leg, initial encounter: Secondary | ICD-10-CM | POA: Diagnosis not present

## 2013-07-16 DIAGNOSIS — E1159 Type 2 diabetes mellitus with other circulatory complications: Secondary | ICD-10-CM | POA: Diagnosis present

## 2013-07-16 DIAGNOSIS — Z860100 Personal history of colon polyps, unspecified: Secondary | ICD-10-CM

## 2013-07-16 DIAGNOSIS — I6529 Occlusion and stenosis of unspecified carotid artery: Secondary | ICD-10-CM | POA: Diagnosis present

## 2013-07-16 DIAGNOSIS — Z8673 Personal history of transient ischemic attack (TIA), and cerebral infarction without residual deficits: Secondary | ICD-10-CM | POA: Diagnosis not present

## 2013-07-16 DIAGNOSIS — K8020 Calculus of gallbladder without cholecystitis without obstruction: Secondary | ICD-10-CM | POA: Diagnosis present

## 2013-07-16 DIAGNOSIS — I25810 Atherosclerosis of coronary artery bypass graft(s) without angina pectoris: Secondary | ICD-10-CM | POA: Diagnosis not present

## 2013-07-16 DIAGNOSIS — K219 Gastro-esophageal reflux disease without esophagitis: Secondary | ICD-10-CM | POA: Diagnosis present

## 2013-07-16 DIAGNOSIS — Z951 Presence of aortocoronary bypass graft: Secondary | ICD-10-CM

## 2013-07-16 DIAGNOSIS — I10 Essential (primary) hypertension: Secondary | ICD-10-CM

## 2013-07-16 DIAGNOSIS — I2510 Atherosclerotic heart disease of native coronary artery without angina pectoris: Secondary | ICD-10-CM | POA: Diagnosis present

## 2013-07-16 DIAGNOSIS — I214 Non-ST elevation (NSTEMI) myocardial infarction: Principal | ICD-10-CM

## 2013-07-16 DIAGNOSIS — E785 Hyperlipidemia, unspecified: Secondary | ICD-10-CM | POA: Diagnosis present

## 2013-07-16 DIAGNOSIS — Z8711 Personal history of peptic ulcer disease: Secondary | ICD-10-CM

## 2013-07-16 DIAGNOSIS — Z955 Presence of coronary angioplasty implant and graft: Secondary | ICD-10-CM

## 2013-07-16 DIAGNOSIS — I359 Nonrheumatic aortic valve disorder, unspecified: Secondary | ICD-10-CM | POA: Diagnosis present

## 2013-07-16 DIAGNOSIS — J9819 Other pulmonary collapse: Secondary | ICD-10-CM | POA: Diagnosis not present

## 2013-07-16 DIAGNOSIS — E119 Type 2 diabetes mellitus without complications: Secondary | ICD-10-CM | POA: Diagnosis not present

## 2013-07-16 HISTORY — DX: Acquired absence of right leg below knee: Z89.511

## 2013-07-16 LAB — CBC
HCT: 35.8 % — ABNORMAL LOW (ref 39.0–52.0)
Hemoglobin: 12.3 g/dL — ABNORMAL LOW (ref 13.0–17.0)
MCH: 32.3 pg (ref 26.0–34.0)
MCHC: 34.4 g/dL (ref 30.0–36.0)

## 2013-07-16 LAB — BASIC METABOLIC PANEL
BUN: 15 mg/dL (ref 6–23)
Chloride: 103 mEq/L (ref 96–112)
Creatinine, Ser: 1.01 mg/dL (ref 0.50–1.35)
GFR calc Af Amer: 84 mL/min — ABNORMAL LOW (ref >90)

## 2013-07-16 LAB — APTT: aPTT: 149 seconds — ABNORMAL HIGH (ref 24–37)

## 2013-07-16 LAB — PROTIME-INR: INR: 1.03 (ref 0.00–1.49)

## 2013-07-16 MED ORDER — SIMETHICONE 80 MG PO CHEW
80.0000 mg | CHEWABLE_TABLET | Freq: Four times a day (QID) | ORAL | Status: DC | PRN
  Filled 2013-07-16: qty 1

## 2013-07-16 MED ORDER — RESTORA PO CAPS: 1.0000 | ORAL_CAPSULE | Freq: Every day | ORAL | Status: DC

## 2013-07-16 MED ORDER — CALCIUM CARBONATE-VITAMIN D 500-200 MG-UNIT PO TABS
1.0000 | ORAL_TABLET | Freq: Every day | ORAL | Status: DC
  Administered 2013-07-18 – 2013-07-20 (×3): 1 via ORAL
  Filled 2013-07-16 (×6): qty 1

## 2013-07-16 MED ORDER — ONDANSETRON HCL 4 MG/2ML IJ SOLN
4.0000 mg | Freq: Three times a day (TID) | INTRAMUSCULAR | Status: AC | PRN
  Administered 2013-07-17: 4 mg via INTRAVENOUS
  Filled 2013-07-16: qty 2

## 2013-07-16 MED ORDER — RISAQUAD PO CAPS
1.0000 | ORAL_CAPSULE | Freq: Every day | ORAL | Status: DC
  Administered 2013-07-17 – 2013-07-20 (×3): 1 via ORAL
  Filled 2013-07-16 (×6): qty 1

## 2013-07-16 MED ORDER — ASPIRIN 81 MG PO TABS: 81.0000 mg | ORAL_TABLET | Freq: Every day | ORAL | Status: DC

## 2013-07-16 MED ORDER — ACETAMINOPHEN 325 MG PO TABS
650.0000 mg | ORAL_TABLET | ORAL | Status: DC | PRN
  Administered 2013-07-16 – 2013-07-17 (×2): 650 mg via ORAL
  Filled 2013-07-16 (×2): qty 2

## 2013-07-16 MED ORDER — INSULIN ASPART 100 UNIT/ML ~~LOC~~ SOLN
0.0000 [IU] | Freq: Three times a day (TID) | SUBCUTANEOUS | Status: DC
  Administered 2013-07-17 – 2013-07-18 (×2): 3 [IU] via SUBCUTANEOUS

## 2013-07-16 MED ORDER — FAMOTIDINE 20 MG PO TABS
20.0000 mg | ORAL_TABLET | Freq: Two times a day (BID) | ORAL | Status: DC
  Administered 2013-07-16 – 2013-07-20 (×8): 20 mg via ORAL
  Filled 2013-07-16 (×10): qty 1

## 2013-07-16 MED ORDER — HEPARIN (PORCINE) IN NACL 100-0.45 UNIT/ML-% IJ SOLN
1100.0000 [IU]/h | INTRAMUSCULAR | Status: DC
  Administered 2013-07-16: 1100 [IU]/h via INTRAVENOUS
  Filled 2013-07-16 (×3): qty 250

## 2013-07-16 MED ORDER — ATORVASTATIN CALCIUM 40 MG PO TABS
40.0000 mg | ORAL_TABLET | Freq: Every day | ORAL | Status: DC
  Administered 2013-07-16: 40 mg via ORAL
  Filled 2013-07-16 (×7): qty 1

## 2013-07-16 MED ORDER — HEPARIN (PORCINE) IN NACL 100-0.45 UNIT/ML-% IJ SOLN
1100.0000 [IU]/h | INTRAMUSCULAR | Status: DC
  Filled 2013-07-16: qty 250

## 2013-07-16 MED ORDER — PANTOPRAZOLE SODIUM 40 MG PO TBEC
40.0000 mg | DELAYED_RELEASE_TABLET | Freq: Every day | ORAL | Status: DC
  Administered 2013-07-16 – 2013-07-20 (×5): 40 mg via ORAL
  Filled 2013-07-16 (×5): qty 1

## 2013-07-16 MED ORDER — NITROGLYCERIN IN D5W 200-5 MCG/ML-% IV SOLN
10.0000 ug/min | Freq: Once | INTRAVENOUS | Status: AC
  Administered 2013-07-16: 10 ug/min via INTRAVENOUS
  Filled 2013-07-16: qty 250

## 2013-07-16 MED ORDER — FINASTERIDE 5 MG PO TABS
5.0000 mg | ORAL_TABLET | Freq: Every day | ORAL | Status: DC
  Administered 2013-07-17 – 2013-07-20 (×4): 5 mg via ORAL
  Filled 2013-07-16 (×4): qty 1

## 2013-07-16 MED ORDER — HEPARIN BOLUS VIA INFUSION
4000.0000 [IU] | Freq: Once | INTRAVENOUS | Status: AC
  Administered 2013-07-16: 4000 [IU] via INTRAVENOUS

## 2013-07-16 MED ORDER — CALCIUM CARBONATE-VITAMIN D 600-125 MG-UNIT PO TABS: 1.0000 | ORAL_TABLET | Freq: Every day | ORAL | Status: DC

## 2013-07-16 MED ORDER — INSULIN ASPART 100 UNIT/ML ~~LOC~~ SOLN: 0.0000 [IU] | Freq: Three times a day (TID) | SUBCUTANEOUS | Status: DC

## 2013-07-16 MED ORDER — AMLODIPINE BESYLATE 2.5 MG PO TABS
2.5000 mg | ORAL_TABLET | Freq: Every day | ORAL | Status: DC
  Administered 2013-07-16: 2.5 mg via ORAL
  Filled 2013-07-16 (×3): qty 1

## 2013-07-16 MED ORDER — INSULIN ASPART 100 UNIT/ML ~~LOC~~ SOLN: 0.0000 [IU] | Freq: Every day | SUBCUTANEOUS | Status: DC

## 2013-07-16 MED ORDER — ADULT MULTIVITAMIN W/MINERALS CH
1.0000 | ORAL_TABLET | Freq: Every day | ORAL | Status: DC
  Administered 2013-07-17 – 2013-07-20 (×2): 1 via ORAL
  Filled 2013-07-16 (×5): qty 1

## 2013-07-16 MED ORDER — LISINOPRIL 5 MG PO TABS
5.0000 mg | ORAL_TABLET | Freq: Two times a day (BID) | ORAL | Status: DC
  Administered 2013-07-16 – 2013-07-19 (×6): 5 mg via ORAL
  Filled 2013-07-16 (×8): qty 1

## 2013-07-16 MED ORDER — ASPIRIN 325 MG PO TABS
325.0000 mg | ORAL_TABLET | Freq: Once | ORAL | Status: AC
  Administered 2013-07-16: 325 mg via ORAL
  Filled 2013-07-16: qty 1

## 2013-07-16 MED ORDER — ONE-DAILY MULTI VITAMINS PO TABS: 1.0000 | ORAL_TABLET | Freq: Every day | ORAL | Status: DC

## 2013-07-16 MED ORDER — PNEUMOCOCCAL VAC POLYVALENT 25 MCG/0.5ML IJ INJ
0.5000 mL | INJECTION | INTRAMUSCULAR | Status: AC
  Filled 2013-07-16: qty 0.5

## 2013-07-16 MED ORDER — ALPRAZOLAM 0.25 MG PO TABS: 0.2500 mg | ORAL_TABLET | Freq: Two times a day (BID) | ORAL | Status: DC | PRN

## 2013-07-16 MED ORDER — NITROGLYCERIN 0.4 MG SL SUBL
0.4000 mg | SUBLINGUAL_TABLET | SUBLINGUAL | Status: DC | PRN
  Administered 2013-07-16 (×2): 0.4 mg via SUBLINGUAL
  Filled 2013-07-16: qty 25

## 2013-07-16 MED ORDER — HEPARIN SODIUM (PORCINE) 5000 UNIT/ML IJ SOLN: 60.0000 [IU]/kg | Freq: Once | INTRAMUSCULAR | Status: DC

## 2013-07-16 MED ORDER — NITROGLYCERIN 2 % TD OINT
1.0000 [in_us] | TOPICAL_OINTMENT | Freq: Once | TRANSDERMAL | Status: AC
  Administered 2013-07-16: 1 [in_us] via TOPICAL
  Filled 2013-07-16: qty 30

## 2013-07-16 MED ORDER — ONDANSETRON HCL 4 MG/2ML IJ SOLN: 4.0000 mg | Freq: Four times a day (QID) | INTRAMUSCULAR | Status: DC | PRN

## 2013-07-16 MED ORDER — NITROGLYCERIN 0.4 MG SL SUBL: 0.4000 mg | SUBLINGUAL_TABLET | SUBLINGUAL | Status: DC | PRN

## 2013-07-16 MED ORDER — SODIUM CHLORIDE 0.9 % IV SOLN: 250.0000 mL | INTRAVENOUS | Status: DC | PRN

## 2013-07-16 MED ORDER — ZOLPIDEM TARTRATE 5 MG PO TABS: 5.0000 mg | ORAL_TABLET | Freq: Every evening | ORAL | Status: DC | PRN

## 2013-07-16 MED ORDER — ACETAMINOPHEN 500 MG PO TABS: 500.0000 mg | ORAL_TABLET | Freq: Four times a day (QID) | ORAL | Status: DC | PRN

## 2013-07-16 MED ORDER — SODIUM CHLORIDE 0.9 % IJ SOLN
3.0000 mL | Freq: Two times a day (BID) | INTRAMUSCULAR | Status: DC
  Administered 2013-07-16 – 2013-07-18 (×4): 3 mL via INTRAVENOUS

## 2013-07-16 MED ORDER — METOPROLOL SUCCINATE ER 50 MG PO TB24
50.0000 mg | ORAL_TABLET | Freq: Every day | ORAL | Status: DC
  Administered 2013-07-17 – 2013-07-20 (×4): 50 mg via ORAL
  Filled 2013-07-16 (×4): qty 1

## 2013-07-16 MED ORDER — ASPIRIN 81 MG PO CHEW
81.0000 mg | CHEWABLE_TABLET | Freq: Every day | ORAL | Status: DC
  Administered 2013-07-17 – 2013-07-20 (×3): 81 mg via ORAL
  Filled 2013-07-16 (×3): qty 1

## 2013-07-16 MED ORDER — HYDROCHLOROTHIAZIDE 25 MG PO TABS
25.0000 mg | ORAL_TABLET | Freq: Every day | ORAL | Status: DC
  Administered 2013-07-17: 25 mg via ORAL
  Filled 2013-07-16 (×2): qty 1

## 2013-07-16 MED ORDER — SODIUM CHLORIDE 0.9 % IJ SOLN: 3.0000 mL | INTRAMUSCULAR | Status: DC | PRN

## 2013-07-16 MED ORDER — TAMSULOSIN HCL 0.4 MG PO CAPS
0.4000 mg | ORAL_CAPSULE | Freq: Every day | ORAL | Status: DC
  Administered 2013-07-16 – 2013-07-19 (×4): 0.4 mg via ORAL
  Filled 2013-07-16 (×5): qty 1

## 2013-07-16 MED ORDER — ASPIRIN 81 MG PO CHEW: 81.0000 mg | CHEWABLE_TABLET | Freq: Every day | ORAL | Status: DC

## 2013-07-16 NOTE — ED Notes (Signed)
Per Dr. Gala Romney: Dr. Gala Romney has called [facility], bed control, and 2900.

## 2013-07-16 NOTE — ED Notes (Signed)
PT c/o of left sided chest pain along with numbness and tingling in left arm that started last night and alasted about 2 hours. Started about 2 hours ago today with the chest pain, SOB with excertion.

## 2013-07-16 NOTE — Progress Notes (Signed)
ANTICOAGULATION CONSULT NOTE - Initial Consult  Pharmacy Consult for heparin  Indication: chest pain/ACS  Allergies  Allergen Reactions  . Altace [Ramipril] Other (See Comments)    hypotension    Patient Measurements: Height: 5\' 8"  (172.7 cm) Weight: 205 lb (92.987 kg) IBW/kg (Calculated) : 68.4 Heparin Dosing Weight: 88kg  Vital Signs: Temp: 98.7 F (37.1 C) (08/22 1654) Temp src: Oral (08/22 1654) BP: 137/52 mmHg (08/22 1905) Pulse Rate: 59 (08/22 1905)  Labs:  Recent Labs  07/16/13 1835  HGB 12.3*  HCT 35.8*  PLT 241    Estimated Creatinine Clearance: 64.6 ml/min (by C-G formula based on Cr of 1.16).   Medical History: Past Medical History  Diagnosis Date  . CAD (coronary artery disease)   . Amputation of lower limb     Right  . Necrotizing fasciitis   . Cogan syndrome   . Hypertension   . Peptic ulcer disease   . Dyslipidemia   . Diabetes mellitus without complication   . Arthritis   . Gallstones   . GERD (gastroesophageal reflux disease)   . Kidney stones   . Colon polyps   . Hepatic hemangioma 2014  . Cholelithiasis 2014    Assessment: 55 YOM with known CAD presents with chest pain and numbness,  First troponin is elevated and orders to start heparin gtt. Baseline CBC is acceptable to start heparin  Goal of Therapy:  Heparin level 0.3-0.7 units/ml Monitor platelets by anticoagulation protocol: Yes   Plan:   Heparin bolus 4000 units then 1100 units/hr  Check heparin level 8h after start of heparin  Daily heparin levels and CBC  Juliette Alcide, PharmD, BCPS.   Pager: [phone]  07/16/2013,7:19 PM

## 2013-07-16 NOTE — H&P (Signed)
Primary MD: Rudi Heap, MD Cardiologist:  [facility]  Chief Complaint: NSTEMI HPI:  Adian Jablonowski is a 72 y.o. 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 He also has a h/o HTN, DM2. Presents with NSTEMI.  Has done very well from a cardiac standpoint since he CABG. He has had several stress tests over the years which have all been negative. He has not had any caths since CABG.  Last night woke up with CP. Wife told him to come to ER but he refused. Lasted 1-2 hours and went away. Pain recurred again today and was off/on all day. About 3p today had severe pain with clamminess. Came to ER. Pain relieved with 2sl NTG. Now pain free. ECG: with chronic LBBB POC troponin 0.79  Denies any bleeding. DM2 well controlled with metformin.  Recent carotid u/s 60-79% L 0-39% R  Review of Systems:     Cardiac Review of Systems: {Y] = yes [ ]  = no  Chest Pain [  y  ]  Resting SOB [   ] Exertional SOB  [ y ]  Orthopnea [  ]   Pedal Edema [   ]    Palpitations [  ] Syncope  [  ]   Presyncope [   ]  General Review of Systems: [Y] = yes [  ]=no Constitional: recent weight change [  ]; anorexia [  ]; fatigue [  ]; nausea [  ]; night sweats [  ]; fever [  ]; or chills [  ];                                                                                                                                          Dental: poor dentition[  ];    Eye : blurred vision [  ]; diplopia [   ]; vision changes [  ];  Amaurosis fugax[  ]; Resp: cough [  ];  wheezing[  ];  hemoptysis[  ]; shortness of breath[  ]; paroxysmal nocturnal dyspnea[  ]; dyspnea on exertion[ y ]; or orthopnea[  ];  GI:  gallstones[  ], vomiting[  ];  dysphagia[  ]; melena[  ];  hematochezia [  ]; heartburn[  ];   Hx of  Colonoscopy[  ]; GU: kidney stones [  ]; hematuria[  ];   dysuria [  ];  nocturia[  ];  history of     obstruction [  ];                 Skin: rash, swelling[  ];, hair loss[  ];   peripheral edema[  ];  or itching[  ]; Musculosketetal: myalgias[  ];  joint swelling[  ];  joint erythema[  ];  joint pain[ y ];  back pain[  ];  Heme/Lymph: bruising[  ];  bleeding[  ];  anemia[  ];  Neuro: TIA[  ];  headaches[  ];  stroke[  ];  vertigo[  ];  seizures[  ];   paresthesias[ y ] numbness/tingling left arm;  difficulty walking[  ];  Psych:depression[  ]; anxiety[  ];  Endocrine: diabetes[ y ];  thyroid dysfunction[  ];  Other:  Past Medical History  Diagnosis Date  . CAD (coronary artery disease)   . Amputation of lower limb     Right  . Necrotizing fasciitis   . Cogan syndrome   . Hypertension   . Peptic ulcer disease   . Dyslipidemia   . Diabetes mellitus without complication   . Arthritis   . Gallstones   . GERD (gastroesophageal reflux disease)   . Kidney stones   . Colon polyps   . Hepatic hemangioma 2014  . Cholelithiasis 2014   Past Surgical History  Procedure Laterality Date  . 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 Coronary artery bypass graft  1999    LIMA to the LAD, saphenous vein graft to obtuse marginal 1 and obtuse marginal 2, spahenous vein graft to intermediate, spahenous vein graft to the right coronary artery in 1999  . Appendectomy  2007   Prior to Admission medications   Medication Sig Start Date End Date Taking? Authorizing Provider  amLODipine (NORVASC) 2.5 MG tablet Take 1 tablet (2.5 mg total) by mouth daily. 12/16/12 12/16/13 Yes Rollene Rotunda, MD  aspirin 81 MG tablet Take 81 mg by mouth daily.     Yes Historical Provider, MD  atorvastatin (LIPITOR) 40 MG tablet Take 1 tablet (40 mg total) by mouth daily. 01/20/13  Yes Rollene Rotunda, MD  Calcium Carbonate-Vitamin D (CALCIUM-CARB 600 + D) 600-125 MG-UNIT TABS Take 1 tablet by mouth daily.     Yes Historical Provider, MD  finasteride (PROSCAR) 5 MG tablet Take 5 mg by mouth daily.   Yes Historical Provider, MD  hydrochlorothiazide (HYDRODIURIL) 25 MG tablet Take 1 tablet (25 mg total) by  mouth daily. 03/02/13  Yes Rollene Rotunda, MD  ibuprofen (ADVIL,MOTRIN) 200 MG tablet Take 400 mg by mouth every 6 (six) hours as needed for pain.   Yes Historical Provider, MD  lisinopril (PRINIVIL,ZESTRIL) 5 MG tablet Take 1 tablet (5 mg total) by mouth 2 (two) times daily. 04/21/13  Yes Rollene Rotunda, MD  metFORMIN (GLUCOPHAGE) 500 MG tablet Take 1 tablet (500 mg total) by mouth daily. 04/27/13  Yes Ernestina Penna, MD  metoprolol succinate (TOPROL-XL) 50 MG 24 hr tablet Take 1 tablet (50 mg total) by mouth daily. 04/21/13  Yes Rollene Rotunda, MD  Multiple Vitamin (MULTIVITAMIN) tablet Take 1 tablet by mouth daily.     Yes Historical Provider, MD  NON FORMULARY Take 10-15 mLs by mouth as needed (for sleep). z quill at bedtime   Yes Historical Provider, MD  omeprazole (PRILOSEC) 20 MG capsule Take 20 mg by mouth daily.     Yes Historical Provider, MD  Probiotic Product (RESTORA PO) Take by mouth.   Yes Historical Provider, MD  ranitidine (ZANTAC) 75 MG tablet Take 75 mg by mouth 2 (two) times daily.     Yes Historical Provider, MD  tamsulosin (FLOMAX) 0.4 MG CAPS Take 0.4 mg by mouth.   Yes Historical Provider, MD  simethicone (GAS-X) 80 MG chewable tablet Chew 1 tablet (80 mg total) by mouth every 6 (six) hours as needed for flatulence. 03/15/13   Beverley Fiedler, MD  Allergies  Allergen Reactions  . Altace [Ramipril] Other (See Comments)    hypotension    History   Social History  . Marital Status: Married    Spouse Name: N/A    Number of Children: 2  . Years of Education: N/A   Occupational History  . Ownder of [employer]    Social History Main Topics  . Smoking status: Former Smoker    Types: Cigarettes    Start date: 12/26/1962    Quit date: 11/25/1997  . Smokeless tobacco: Never Used  . Alcohol Use: 8.4 oz/week    14 Shots of liquor per week     Comment: daily   . Drug Use: No  . Sexual Activity: Not on file   Other Topics Concern  . Not on file   Social  History Narrative   Lives in [place] with his wife    Family History  Problem Relation Age of Onset  . Colon cancer Neg Hx   . Hyperlipidemia Mother   . Hypertension Mother   . Other Mother     varicose veins  . Diabetes Sister   . Heart disease Mother   . Heart disease Maternal Grandfather   . Rheum arthritis Father   . Heart attack Father    Family Status  Relation Status Death Age  . Mother Deceased   . Father Deceased     PHYSICAL EXAM: Filed Vitals:   07/16/13 1905  BP: 137/52  Pulse: 59  Temp:   Resp: 14   General:  Well appearing. No respiratory difficulty HEENT: normal Neck: supple. no JVD. Carotids 2+ bilat; bilateral radiated bruits. No lymphadenopathy or thryomegaly appreciated. Cor: PMI nondisplaced. Regular rate & rhythm. 2/6 AS murmur Lungs: clear Abdomen: soft, nontender, nondistended. No hepatosplenomegaly. No bruits or masses. Good bowel sounds. Extremities: no cyanosis, clubbing, rash, edema. S/p R BKA with prosthesis Neuro: alert & oriented x 3, cranial nerves grossly intact. moves all 4 extremities w/o difficulty. Affect pleasant.  ECG: SR 60 with chronic LBBB 1avb  Results for orders placed during the hospital encounter of 07/16/13 (from the past 24 hour(s))  CBC     Status: Abnormal   Collection Time    07/16/13  6:35 PM      Result Value Range   WBC 5.1  4.0 - 10.5 K/uL   RBC 3.81 (*) 4.22 - 5.81 MIL/uL   Hemoglobin 12.3 (*) 13.0 - 17.0 g/dL   HCT 16.1 (*) 09.6 - 04.5 %   MCV 94.0  78.0 - 100.0 fL   MCH 32.3  26.0 - 34.0 pg   MCHC 34.4  30.0 - 36.0 g/dL   RDW 40.9  81.1 - 91.4 %   Platelets 241  150 - 400 K/uL  POCT I-STAT TROPONIN I     Status: Abnormal   Collection Time    07/16/13  6:48 PM      Result Value Range   Troponin i, poc 0.79 (*) 0.00 - 0.08 ng/mL   Comment NOTIFIED PHYSICIAN     Comment 3            Dg Chest Port 1 View 07/16/2013   *RADIOLOGY REPORT*  Clinical Data: Left-sided chest pain.  Left upper extremity  numbness and paresthesias.  Prior CABG.  PORTABLE CHEST - 1 VIEW  Comparison: Two-view chest x-ray 04/28/2013 [facility].  Findings: Prior sternotomy for CABG.  Cardiac silhouette mildly enlarged but stable.  Pulmonary venous hypertension without overt edema currently.  Suboptimal inspiration which accounts for mild bibasilar atelectasis, right greater than left.  Opacity projected over the left upper lobe is likely calcified costal cartilage involving the anterior left first rib, but does appear more prominent than on the prior examination.  IMPRESSION:  1.  Suboptimal inspiration accounts for mild bibasilar atelectasis. Stable cardiomegaly.  Pulmonary venous hypertension without overt edema. 2.  Opacity projected over the left upper lobe is likely calcification involving the costal cartilage of the anterior left first rib.  It is more prominent than on the prior examination. Follow up two-view chest x-ray is suggested when the patient is clinically able.   Original Report Authenticated By: Hulan Saas, M.D.   ASSESSMENT:  1.  Non-ST elevation myocardial infarction (NSTEMI), initial episode of care 2.  DYSLIPIDEMIA 3.  HYPERTENSION 4.  GERD (gastroesophageal reflux disease) 5.  DM2 6.  Abnormal CXR 7.  Aortic stenosis murmur 8.  Carotid artery stenosis  PLAN/DISCUSSION:  We will admit him to stepdown at [facility]. Treat with ASA, statin, NTG, b-blocker, heparin and IV NTG. Plan cath on Monday unless he develops recurrent pain and then we will go sooner. Will not load Plavix until we get echo and look at aortic valve. Hold metformin. Cover with SSI  Truman Hayward 8:02 PM

## 2013-07-16 NOTE — ED Provider Notes (Signed)
CSN: [id number]     Arrival date & time 07/16/13  1643 History     First MD Initiated Contact with Patient 07/16/13 1701     Chief Complaint  Patient presents with  . Chest Pain  . Numbness    left extremity   (Consider location/radiation/quality/duration/timing/severity/associated sxs/prior Treatment) Patient is a 72 y.o. male presenting with chest pain. The history is provided by the patient. No language interpreter was used.  Chest Pain Pain location:  L chest Pain quality: aching and burning   Pain radiates to:  Neck Pain radiates to the back: no   Pain severity:  Mild (currently mild, was moderate) Onset quality:  Gradual Duration: waxing waning for about 15 hours. Timing:  Unable to specify Progression:  Waxing and waning Chronicity:  Recurrent Relieved by:  Nothing Worsened by:  Nothing tried Ineffective treatments:  Aspirin Associated symptoms: diaphoresis, nausea, numbness (L arm) and shortness of breath   Associated symptoms: no abdominal pain, no anxiety, no back pain, no cough, no dizziness, no dysphagia, no fatigue, no fever, no headache, no palpitations, not vomiting and no weakness   Risk factors: coronary artery disease, diabetes mellitus, high cholesterol, hypertension and male sex     Past Medical History  Diagnosis Date  . CAD (coronary artery disease)   . Amputation of lower limb     Right  . Necrotizing fasciitis   . Cogan syndrome   . Hypertension   . Peptic ulcer disease   . Dyslipidemia   . Diabetes mellitus without complication   . Arthritis   . Gallstones   . GERD (gastroesophageal reflux disease)   . Kidney stones   . Colon polyps   . Hepatic hemangioma 2014  . Cholelithiasis 2014   Past Surgical History  Procedure Laterality Date  . 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 Coronary artery bypass graft  1999    LIMA to the LAD, saphenous vein graft to obtuse marginal 1 and obtuse marginal 2, spahenous vein graft to intermediate,  spahenous vein graft to the right coronary artery in 1999  . Appendectomy  2007   Family History  Problem Relation Age of Onset  . Colon cancer Neg Hx   . Hyperlipidemia Mother   . Hypertension Mother   . Other Mother     varicose veins  . Diabetes Sister   . Heart disease Mother   . Heart disease Maternal Grandfather   . Rheum arthritis Father   . Heart attack Father    History  Substance Use Topics  . Smoking status: Former Smoker    Types: Cigarettes    Start date: 12/26/1962    Quit date: 11/25/1997  . Smokeless tobacco: Never Used  . Alcohol Use: 8.4 oz/week    14 Shots of liquor per week     Comment: daily     Review of Systems  Constitutional: Positive for diaphoresis. Negative for fever, activity change, appetite change and fatigue.  HENT: Negative for congestion, facial swelling, rhinorrhea and trouble swallowing.   Eyes: Negative for photophobia and pain.  Respiratory: Positive for shortness of breath. Negative for cough and chest tightness.   Cardiovascular: Positive for chest pain. Negative for palpitations and leg swelling.  Gastrointestinal: Positive for nausea. Negative for vomiting, abdominal pain, diarrhea and constipation.  Endocrine: Negative for polydipsia and polyuria.  Genitourinary: Negative for dysuria, urgency, decreased urine volume and difficulty urinating.  Musculoskeletal: Negative for back pain and gait problem.  Skin: Negative  for color change, rash and wound.  Allergic/Immunologic: Negative for immunocompromised state.  Neurological: Positive for numbness (L arm). Negative for dizziness, facial asymmetry, speech difficulty, weakness and headaches.  Psychiatric/Behavioral: Negative for confusion, decreased concentration and agitation.    Allergies  Altace  Home Medications   No current outpatient prescriptions on file. BP 169/62  Pulse 68  Temp(Src) 98 F (36.7 C) (Oral)  Resp 16  Ht 5\' 8"  (1.727 m)  Wt 202 lb 9.6 oz (91.9 kg)   BMI 30.81 kg/m2  SpO2 96% Physical Exam  Constitutional: He is oriented to person, place, and time. He appears well-developed and well-nourished. No distress.  HENT:  Head: Normocephalic and atraumatic.  Mouth/Throat: No oropharyngeal exudate.  Eyes: Pupils are equal, round, and reactive to light.  Neck: Normal range of motion. Neck supple.  Cardiovascular: Normal rate, regular rhythm and normal heart sounds.  Exam reveals no gallop and no friction rub.   No murmur heard. Pulmonary/Chest: Effort normal and breath sounds normal. No respiratory distress. He has no wheezes. He has no rales.  Abdominal: Soft. Bowel sounds are normal. He exhibits no distension and no mass. There is no tenderness. There is no rebound and no guarding.  Musculoskeletal: Normal range of motion. He exhibits no edema and no tenderness.  Prosthetic R leg, 1_ pitting edema L leg  Neurological: He is alert and oriented to person, place, and time.  Skin: Skin is warm and dry.  Psychiatric: He has a normal mood and affect.    ED Course   Procedures (including critical care time)  Labs Reviewed  CBC - Abnormal; Notable for the following:    RBC 3.81 (*)    Hemoglobin 12.3 (*)    HCT 35.8 (*)    All other components within normal limits  BASIC METABOLIC PANEL - Abnormal; Notable for the following:    Glucose, Bld 107 (*)    GFR calc non Af Amer 73 (*)    GFR calc Af Amer 84 (*)    All other components within normal limits  PRO B NATRIURETIC PEPTIDE - Abnormal; Notable for the following:    Pro B Natriuretic peptide (BNP) 1113.0 (*)    All other components within normal limits  TROPONIN I - Abnormal; Notable for the following:    Troponin I 2.32 (*)    All other components within normal limits  TROPONIN I - Abnormal; Notable for the following:    Troponin I 2.86 (*)    All other components within normal limits  TROPONIN I - Abnormal; Notable for the following:    Troponin I 2.90 (*)    All other components  within normal limits  COMPREHENSIVE METABOLIC PANEL - Abnormal; Notable for the following:    Potassium 3.1 (*)    Glucose, Bld 114 (*)    Albumin 3.1 (*)    AST 38 (*)    Alkaline Phosphatase 27 (*)    GFR calc non Af Amer 84 (*)    All other components within normal limits  APTT - Abnormal; Notable for the following:    aPTT 149 (*)    All other components within normal limits  GLUCOSE, CAPILLARY - Abnormal; Notable for the following:    Glucose-Capillary 113 (*)    All other components within normal limits  LIPID PANEL - Abnormal; Notable for the following:    Triglycerides 163 (*)    All other components within normal limits  HEPARIN LEVEL (UNFRACTIONATED) - Abnormal; Notable for the  following:    Heparin Unfractionated 0.26 (*)    All other components within normal limits  POCT I-STAT TROPONIN I - Abnormal; Notable for the following:    Troponin i, poc 0.79 (*)    All other components within normal limits  MRSA PCR SCREENING  PROTIME-INR  MAGNESIUM  HEPARIN LEVEL (UNFRACTIONATED)  OCCULT BLOOD X 1 CARD TO LAB, STOOL  TSH  HEMOGLOBIN A1C  HEPARIN LEVEL (UNFRACTIONATED)   Dg Chest [facility] 1 View  07/16/2013   *RADIOLOGY REPORT*  Clinical Data: Left-sided chest pain.  Left upper extremity numbness and paresthesias.  Prior CABG.  PORTABLE CHEST - 1 VIEW  Comparison: Two-view chest x-ray 04/28/2013 [facility].  Findings: Prior sternotomy for CABG.  Cardiac silhouette mildly enlarged but stable.  Pulmonary venous hypertension without overt edema currently.  Suboptimal inspiration which accounts for mild bibasilar atelectasis, right greater than left.  Opacity projected over the left upper lobe is likely calcified costal cartilage involving the anterior left first rib, but does appear more prominent than on the prior examination.  IMPRESSION:  1.  Suboptimal inspiration accounts for mild bibasilar atelectasis. Stable cardiomegaly.  Pulmonary venous hypertension  without overt edema. 2.  Opacity projected over the left upper lobe is likely calcification involving the costal cartilage of the anterior left first rib.  It is more prominent than on the prior examination. Follow up two-view chest x-ray is suggested when the patient is clinically able.   Original Report Authenticated By: Hulan Saas, M.D.   1. NSTEMI (non-ST elevated myocardial infarction)   2. Essential hypertension, benign   3. Hypokalemia      Date: 07/16/2013  Rate: 64  Rhythm: normal sinus rhythm  QRS Axis: left  Intervals: QRS prolonged  ST/T Wave abnormalities: nonspecific ST/T changes, Q waves V1-V3, TWI I, aVL  Conduction Disutrbances:left bundle branch block  Narrative Interpretation:   Old EKG Reviewed: changes noted, LBBB now more prominent, new TWI I    MDM  Pt is a 72 y.o. male with Pmhx as above including CAD w/ hx of CABG who presents with L sided chest pain beginning around 2am yesterday, then much worse today at 4pm at rest.  Mild assoc SOB, nausea, diaphoresis.  VSS, on exam, cardiopulm exam benign.  EKG w/ LBB that is more pronounced today that prior EKG, but does not meet sgarbossa criteria for MI.  ASA, SL NTG ordered.  Pain 1/10 at time of initial interview, 0/10 after 2 SL NTG.  Trop returned at 0.79.  Heparin bolus & gtt ordered, cardiology consulted, saw pt in ED and pt transferred to step down unit at [facility] for NSTEMI.    1. NSTEMI (non-ST elevated myocardial infarction)   2. Essential hypertension, benign   3. Hypokalemia        Shanna Cisco, MD 07/17/13 1145

## 2013-07-16 NOTE — ED Notes (Signed)
Critical I stat - Dr Micheline Maze is aware. 1900

## 2013-07-17 DIAGNOSIS — I359 Nonrheumatic aortic valve disorder, unspecified: Secondary | ICD-10-CM

## 2013-07-17 DIAGNOSIS — E876 Hypokalemia: Secondary | ICD-10-CM

## 2013-07-17 DIAGNOSIS — I214 Non-ST elevation (NSTEMI) myocardial infarction: Secondary | ICD-10-CM

## 2013-07-17 DIAGNOSIS — I10 Essential (primary) hypertension: Secondary | ICD-10-CM | POA: Insufficient documentation

## 2013-07-17 LAB — COMPREHENSIVE METABOLIC PANEL
AST: 38 U/L — ABNORMAL HIGH (ref 0–37)
Albumin: 3.1 g/dL — ABNORMAL LOW (ref 3.5–5.2)
Calcium: 8.7 mg/dL (ref 8.4–10.5)
Creatinine, Ser: 0.9 mg/dL (ref 0.50–1.35)

## 2013-07-17 LAB — MRSA PCR SCREENING: MRSA by PCR: NEGATIVE

## 2013-07-17 LAB — HEPARIN LEVEL (UNFRACTIONATED)
Heparin Unfractionated: 0.42 IU/mL (ref 0.30–0.70)
Heparin Unfractionated: 0.26 IU/mL — ABNORMAL LOW (ref 0.30–0.70)
Heparin Unfractionated: 0.45 IU/mL (ref 0.30–0.70)

## 2013-07-17 LAB — TROPONIN I
Troponin I: 2.86 ng/mL (ref <0.30)
Troponin I: 2.9 ng/mL (ref <0.30)

## 2013-07-17 LAB — GLUCOSE, CAPILLARY: Glucose-Capillary: 119 mg/dL — ABNORMAL HIGH (ref 70–99)

## 2013-07-17 LAB — LIPID PANEL
LDL Cholesterol: 49 mg/dL (ref 0–99)
VLDL: 33 mg/dL (ref 0–40)

## 2013-07-17 LAB — HEMOGLOBIN A1C
Hgb A1c MFr Bld: 5.5 % (ref <5.7)
Mean Plasma Glucose: 111 mg/dL (ref <117)

## 2013-07-17 LAB — TSH: TSH: 3.173 u[IU]/mL (ref 0.350–4.500)

## 2013-07-17 MED ORDER — FENTANYL CITRATE 0.05 MG/ML IJ SOLN
25.0000 ug | INTRAMUSCULAR | Status: DC | PRN
  Administered 2013-07-17 – 2013-07-18 (×6): 25 ug via INTRAVENOUS
  Filled 2013-07-17 (×6): qty 2

## 2013-07-17 MED ORDER — HYDRALAZINE HCL 20 MG/ML IJ SOLN
10.0000 mg | Freq: Four times a day (QID) | INTRAMUSCULAR | Status: DC | PRN
  Administered 2013-07-17 – 2013-07-19 (×2): 10 mg via INTRAVENOUS
  Filled 2013-07-17 (×3): qty 1

## 2013-07-17 MED ORDER — HEPARIN (PORCINE) IN NACL 100-0.45 UNIT/ML-% IJ SOLN
1250.0000 [IU]/h | INTRAMUSCULAR | Status: DC
  Administered 2013-07-17 – 2013-07-19 (×3): 1250 [IU]/h via INTRAVENOUS
  Filled 2013-07-17 (×5): qty 250

## 2013-07-17 MED ORDER — POTASSIUM CHLORIDE CRYS ER 20 MEQ PO TBCR
40.0000 meq | EXTENDED_RELEASE_TABLET | Freq: Once | ORAL | Status: AC
  Administered 2013-07-17: 40 meq via ORAL
  Filled 2013-07-17: qty 2

## 2013-07-17 MED ORDER — AMLODIPINE BESYLATE 5 MG PO TABS
5.0000 mg | ORAL_TABLET | Freq: Every day | ORAL | Status: DC
  Administered 2013-07-17 – 2013-07-20 (×4): 5 mg via ORAL
  Filled 2013-07-17 (×4): qty 1

## 2013-07-17 NOTE — Progress Notes (Signed)
ANTICOAGULATION CONSULT NOTE - Follow Up Consult  Pharmacy Consult for heparin Indication: chest pain/ACS  Allergies  Allergen Reactions  . Altace [Ramipril] Other (See Comments)    hypotension    Patient Measurements: Height: 5\' 8"  (172.7 cm) Weight: 202 lb 9.6 oz (91.9 kg) IBW/kg (Calculated) : 68.4 Heparin Dosing Weight: 88kg  Vital Signs: Temp: 98 F (36.7 C) (08/23 1722) Temp src: Oral (08/23 1722) BP: 140/56 mmHg (08/23 1722)  Labs:  Recent Labs  07/16/13 1835 07/16/13 1930 07/16/13 2215 07/17/13 0400 07/17/13 0555 07/17/13 0924 07/17/13 1858  HGB 12.3*  --   --   --   --   --   --   HCT 35.8*  --   --   --   --   --   --   PLT 241  --   --   --   --   --   --   APTT  --   --  149*  --   --   --   --   LABPROT  --   --  13.3  --   --   --   --   INR  --   --  1.03  --   --   --   --   HEPARINUNFRC  --   --   --   --  0.42 0.26* 0.45  CREATININE  --  1.01  --  0.90  --   --   --   TROPONINI  --   --  2.32* 2.86*  --  2.90*  --     Estimated Creatinine Clearance: 82.8 ml/min (by C-G formula based on Cr of 0.9).   Medications:  Scheduled:  . acidophilus  1 capsule Oral Daily  . amLODipine  5 mg Oral Daily  . aspirin  81 mg Oral Daily  . atorvastatin  40 mg Oral q1800  . calcium-vitamin D  1 tablet Oral Q breakfast  . famotidine  20 mg Oral BID  . finasteride  5 mg Oral Daily  . hydrochlorothiazide  25 mg Oral Daily  . insulin aspart  0-15 Units Subcutaneous TID WC  . insulin aspart  0-5 Units Subcutaneous QHS  . lisinopril  5 mg Oral BID  . metoprolol succinate  50 mg Oral Daily  . multivitamin with minerals  1 tablet Oral Daily  . pantoprazole  40 mg Oral Daily  . pneumococcal 23 valent vaccine  0.5 mL Intramuscular Tomorrow-1000  . sodium chloride  3 mL Intravenous Q12H  . tamsulosin  0.4 mg Oral Daily   Infusions:  . heparin 1,250 Units/hr (07/17/13 1502)    Assessment: 72 y.o. male with a history of CAD s/p CABG (1999) here with NSTEMI  on heparin and at goal (HL= 0.45). Patient noted for cath on Monday.   Goal of Therapy:  Heparin level 0.3-0.7 units/ml Monitor platelets by anticoagulation protocol: Yes   Plan:  -No heparin changes -Will repeat heparin level today to confirm -Daily heparin level and CBC   Thank you for allowing pharmacy to be a part of this patients care team.  Lovenia Kim Pharm.D., BCPS Clinical Pharmacist 07/17/2013 7:43 PM Pager: [phone] Phone: [phone]

## 2013-07-17 NOTE — Progress Notes (Signed)
Echo Lab  2D Echocardiogram completed.  Laterrica Libman L Raylinn Kosar, RDCS 07/17/2013 10:43 AM

## 2013-07-17 NOTE — Progress Notes (Signed)
Primary MD: Rudi Heap, MD Cardiologist:  [facility]  Subjective:   [name] Cook is a 72 y.o. 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 He also has a h/o HTN, DM2. Admitted  with NSTEMI.  Recent carotid u/s 60-79% L 0-39% R  No CP this am. Feels fine except continues to have rotator cuff pain. Troponin peaked @ 2.86. Remains on NTG and heparin.   Echo pending to evaluate AS.    PHYSICAL EXAM: Filed Vitals:   07/17/13 0329  BP: 136/54  Pulse: 60  Temp: 97.9 F (36.6 C)  Resp: 10   General:  Well appearing. No respiratory difficulty HEENT: normal Neck: supple. no JVD. Carotids 2+ bilat; bilateral radiated bruits. No lymphadenopathy or thryomegaly appreciated. Cor: PMI nondisplaced. Regular rate & rhythm. 2/6 AS murmur Lungs: clear Abdomen: soft, nontender, nondistended. No hepatosplenomegaly. No bruits or masses. Good bowel sounds. Extremities: no cyanosis, clubbing, rash, edema. S/p R BKA with prosthesis Neuro: alert & oriented x 3, cranial nerves grossly intact. moves all 4 extremities w/o difficulty. Affect pleasant.  ECG: SR 60 with chronic LBBB 1avb  Results for orders placed during the hospital encounter of 07/16/13 (from the past 24 hour(s))  CBC     Status: Abnormal   Collection Time    07/16/13  6:35 PM      Result Value Range   WBC 5.1  4.0 - 10.5 K/uL   RBC 3.81 (*) 4.22 - 5.81 MIL/uL   Hemoglobin 12.3 (*) 13.0 - 17.0 g/dL   HCT 16.1 (*) 09.6 - 04.5 %   MCV 94.0  78.0 - 100.0 fL   MCH 32.3  26.0 - 34.0 pg   MCHC 34.4  30.0 - 36.0 g/dL   RDW 40.9  81.1 - 91.4 %   Platelets 241  150 - 400 K/uL  POCT I-STAT TROPONIN I     Status: Abnormal   Collection Time    07/16/13  6:48 PM      Result Value Range   Troponin i, poc 0.79 (*) 0.00 - 0.08 ng/mL   Comment NOTIFIED PHYSICIAN     Comment 3           BASIC METABOLIC PANEL     Status: Abnormal   Collection Time    07/16/13  7:30 PM      Result Value Range     Sodium 138  135 - 145 mEq/L   Potassium 3.5  3.5 - 5.1 mEq/L   Chloride 103  96 - 112 mEq/L   CO2 28  19 - 32 mEq/L   Glucose, Bld 107 (*) 70 - 99 mg/dL   BUN 15  6 - 23 mg/dL   Creatinine, Ser 7.82  0.50 - 1.35 mg/dL   Calcium 8.7  8.4 - 95.6 mg/dL   GFR calc non Af Amer 73 (*) >90 mL/min   GFR calc Af Amer 84 (*) >90 mL/min  PRO B NATRIURETIC PEPTIDE     Status: Abnormal   Collection Time    07/16/13  7:30 PM      Result Value Range   Pro B Natriuretic peptide (BNP) 1113.0 (*) 0 - 125 pg/mL  MRSA PCR SCREENING     Status: None   Collection Time    07/16/13  9:16 PM      Result Value Range   MRSA by PCR NEGATIVE  NEGATIVE  GLUCOSE, CAPILLARY     Status: Abnormal  Collection Time    07/16/13 10:00 PM      Result Value Range   Glucose-Capillary 113 (*) 70 - 99 mg/dL  TROPONIN I     Status: Abnormal   Collection Time    07/16/13 10:15 PM      Result Value Range   Troponin I 2.32 (*) <0.30 ng/mL  PROTIME-INR     Status: None   Collection Time    07/16/13 10:15 PM      Result Value Range   Prothrombin Time 13.3  11.6 - 15.2 seconds   INR 1.03  0.00 - 1.49  APTT     Status: Abnormal   Collection Time    07/16/13 10:15 PM      Result Value Range   aPTT 149 (*) 24 - 37 seconds  MAGNESIUM     Status: None   Collection Time    07/16/13 10:15 PM      Result Value Range   Magnesium 1.8  1.5 - 2.5 mg/dL  TROPONIN I     Status: Abnormal   Collection Time    07/17/13  4:00 AM      Result Value Range   Troponin I 2.86 (*) <0.30 ng/mL  COMPREHENSIVE METABOLIC PANEL     Status: Abnormal   Collection Time    07/17/13  4:00 AM      Result Value Range   Sodium 140  135 - 145 mEq/L   Potassium 3.1 (*) 3.5 - 5.1 mEq/L   Chloride 103  96 - 112 mEq/L   CO2 27  19 - 32 mEq/L   Glucose, Bld 114 (*) 70 - 99 mg/dL   BUN 12  6 - 23 mg/dL   Creatinine, Ser 5.40  0.50 - 1.35 mg/dL   Calcium 8.7  8.4 - 98.1 mg/dL   Total Protein 6.2  6.0 - 8.3 g/dL   Albumin 3.1 (*) 3.5 - 5.2 g/dL    AST 38 (*) 0 - 37 U/L   ALT 24  0 - 53 U/L   Alkaline Phosphatase 27 (*) 39 - 117 U/L   Total Bilirubin 0.5  0.3 - 1.2 mg/dL   GFR calc non Af Amer 84 (*) >90 mL/min   GFR calc Af Amer >90  >90 mL/min  LIPID PANEL     Status: Abnormal   Collection Time    07/17/13  4:00 AM      Result Value Range   Cholesterol 123  0 - 200 mg/dL   Triglycerides 191 (*) <150 mg/dL   HDL 41  >47 mg/dL   Total CHOL/HDL Ratio 3.0     VLDL 33  0 - 40 mg/dL   LDL Cholesterol 49  0 - 99 mg/dL  HEPARIN LEVEL (UNFRACTIONATED)     Status: None   Collection Time    07/17/13  5:55 AM      Result Value Range   Heparin Unfractionated 0.42  0.30 - 0.70 IU/mL   Dg Chest Port 1 View 07/16/2013   *RADIOLOGY REPORT*  Clinical Data: Left-sided chest pain.  Left upper extremity numbness and paresthesias.  Prior CABG.  PORTABLE CHEST - 1 VIEW  Comparison: Two-view chest x-ray 04/28/2013 [facility].  Findings: Prior sternotomy for CABG.  Cardiac silhouette mildly enlarged but stable.  Pulmonary venous hypertension without overt edema currently.  Suboptimal inspiration which accounts for mild bibasilar atelectasis, right greater than left.  Opacity projected over the left upper lobe is likely calcified costal cartilage  involving the anterior left first rib, but does appear more prominent than on the prior examination.  IMPRESSION:  1.  Suboptimal inspiration accounts for mild bibasilar atelectasis. Stable cardiomegaly.  Pulmonary venous hypertension without overt edema. 2.  Opacity projected over the left upper lobe is likely calcification involving the costal cartilage of the anterior left first rib.  It is more prominent than on the prior examination. Follow up two-view chest x-ray is suggested when the patient is clinically able.   Original Report Authenticated By: Hulan Saas, M.D.   ASSESSMENT:  1.  Non-ST elevation myocardial infarction (NSTEMI), initial episode of care 2.  DYSLIPIDEMIA 3.   HYPERTENSION 4.  GERD (gastroesophageal reflux disease) 5.  DM2 6.  Abnormal CXR 7.  Aortic stenosis murmur 8.  Carotid artery stenosis 9.  Hypokalemia  PLAN/DISCUSSION:  No further CP. Troponin peaked at 2.86. On for cath Monday. Continue ASA, statin, NTG, b-blocker, heparin and IV NTG. Plan cath on Monday unless he develops recurrent pain and then we will go sooner. Will not load Plavix until we get echo and look at aortic valve.   Continue to hold metformin. Cover with SSI. Will give BP meds now and add PRN hydralazine for HTN. Supp K+  Truman Hayward 7:43 AM

## 2013-07-17 NOTE — Progress Notes (Addendum)
ANTICOAGULATION CONSULT NOTE - Follow Up Consult  Pharmacy Consult for heparin Indication: chest pain/ACS  Allergies  Allergen Reactions  . Altace [Ramipril] Other (See Comments)    hypotension    Patient Measurements: Height: 5\' 8"  (172.7 cm) Weight: 205 lb (92.987 kg) IBW/kg (Calculated) : 68.4 Heparin Dosing Weight: 88kg  Vital Signs: Temp: 97.9 F (36.6 C) (08/23 0329) Temp src: Oral (08/23 0329) BP: 136/54 mmHg (08/23 0329) Pulse Rate: 60 (08/23 0329)  Labs:  Recent Labs  07/16/13 1835 07/16/13 1930 07/16/13 2215 07/17/13 0400 07/17/13 0555  HGB 12.3*  --   --   --   --   HCT 35.8*  --   --   --   --   PLT 241  --   --   --   --   APTT  --   --  149*  --   --   LABPROT  --   --  13.3  --   --   INR  --   --  1.03  --   --   HEPARINUNFRC  --   --   --   --  0.42  CREATININE  --  1.01  --  0.90  --   TROPONINI  --   --  2.32* 2.86*  --     Estimated Creatinine Clearance: 83.3 ml/min (by C-G formula based on Cr of 0.9).   Medications:  Scheduled:  . acidophilus  1 capsule Oral Daily  . amLODipine  2.5 mg Oral Daily  . aspirin  81 mg Oral Daily  . atorvastatin  40 mg Oral q1800  . calcium-vitamin D  1 tablet Oral Q breakfast  . famotidine  20 mg Oral BID  . finasteride  5 mg Oral Daily  . hydrochlorothiazide  25 mg Oral Daily  . insulin aspart  0-15 Units Subcutaneous TID WC  . insulin aspart  0-15 Units Subcutaneous TID WC  . insulin aspart  0-5 Units Subcutaneous QHS  . lisinopril  5 mg Oral BID  . metoprolol succinate  50 mg Oral Daily  . multivitamin with minerals  1 tablet Oral Daily  . pantoprazole  40 mg Oral Daily  . pneumococcal 23 valent vaccine  0.5 mL Intramuscular Tomorrow-1000  . sodium chloride  3 mL Intravenous Q12H  . tamsulosin  0.4 mg Oral Daily   Infusions:  . heparin 1,100 Units/hr (07/16/13 1957)    Assessment: 72 y.o. male with a history of CAD s/p CABG (1999) here with NSTEMI on heparin and at goal (HL= 0.42). Patient  noted for cath on Monday.   Goal of Therapy:  Heparin level 0.3-0.7 units/ml Monitor platelets by anticoagulation protocol: Yes   Plan:  -No heparin changes -Will repeat heparin level today to confirm -Daily heparin level and CBC  Harland German, Pharm D 07/17/2013 7:16 AM   Addendum: heparin -repeat heparin level= 0.26  Plan -Increase heparin to 1250 units/hr -Heparin level in 8hrs  Harland German, Pharm D 07/17/2013 10:22 AM

## 2013-07-18 LAB — GLUCOSE, CAPILLARY
Glucose-Capillary: 131 mg/dL — ABNORMAL HIGH (ref 70–99)
Glucose-Capillary: 104 mg/dL — ABNORMAL HIGH (ref 70–99)
Glucose-Capillary: 126 mg/dL — ABNORMAL HIGH (ref 70–99)

## 2013-07-18 LAB — CBC
MCHC: 35.1 g/dL (ref 30.0–36.0)
RDW: 14.9 % (ref 11.5–15.5)

## 2013-07-18 LAB — HEPARIN LEVEL (UNFRACTIONATED): Heparin Unfractionated: 0.56 IU/mL (ref 0.30–0.70)

## 2013-07-18 MED ORDER — OXYCODONE-ACETAMINOPHEN 5-325 MG PO TABS
1.0000 | ORAL_TABLET | Freq: Four times a day (QID) | ORAL | Status: DC | PRN
Start: 2013-07-18 — End: 2013-07-20
  Administered 2013-07-18 – 2013-07-20 (×5): 2 via ORAL
  Filled 2013-07-18 (×5): qty 2

## 2013-07-18 MED ORDER — SODIUM CHLORIDE 0.9 % IV SOLN
1.0000 mL/kg/h | INTRAVENOUS | Status: DC
  Administered 2013-07-18: 1 mL/kg/h via INTRAVENOUS

## 2013-07-18 NOTE — Progress Notes (Signed)
ANTICOAGULATION CONSULT NOTE - Follow Up Consult  Pharmacy Consult for heparin Indication: chest pain/ACS  Allergies  Allergen Reactions  . Altace [Ramipril] Other (See Comments)    hypotension    Patient Measurements: Height: 5\' 8"  (172.7 cm) Weight: 202 lb 6.1 oz (91.8 kg) IBW/kg (Calculated) : 68.4 Heparin Dosing Weight: 88kg  Vital Signs: Temp: 98.2 F (36.8 C) (08/24 0700) Temp src: Oral (08/24 0700) BP: 155/58 mmHg (08/24 0700)  Labs:  Recent Labs  07/16/13 1835 07/16/13 1930 07/16/13 2215 07/17/13 0400  07/17/13 0924 07/17/13 1858 07/18/13 0400  HGB 12.3*  --   --   --   --   --   --  12.6*  HCT 35.8*  --   --   --   --   --   --  35.9*  PLT 241  --   --   --   --   --   --  220  APTT  --   --  149*  --   --   --   --   --   LABPROT  --   --  13.3  --   --   --   --   --   INR  --   --  1.03  --   --   --   --   --   HEPARINUNFRC  --   --   --   --   < > 0.26* 0.45 0.56  CREATININE  --  1.01  --  0.90  --   --   --   --   TROPONINI  --   --  2.32* 2.86*  --  2.90*  --   --   < > = values in this interval not displayed.  Estimated Creatinine Clearance: 82.8 ml/min (by C-G formula based on Cr of 0.9).   Medications:  Scheduled:  . acidophilus  1 capsule Oral Daily  . amLODipine  5 mg Oral Daily  . aspirin  81 mg Oral Daily  . atorvastatin  40 mg Oral q1800  . calcium-vitamin D  1 tablet Oral Q breakfast  . famotidine  20 mg Oral BID  . finasteride  5 mg Oral Daily  . insulin aspart  0-15 Units Subcutaneous TID WC  . insulin aspart  0-5 Units Subcutaneous QHS  . lisinopril  5 mg Oral BID  . metoprolol succinate  50 mg Oral Daily  . multivitamin with minerals  1 tablet Oral Daily  . pantoprazole  40 mg Oral Daily  . pneumococcal 23 valent vaccine  0.5 mL Intramuscular Tomorrow-1000  . sodium chloride  3 mL Intravenous Q12H  . tamsulosin  0.4 mg Oral Daily   Infusions:  . heparin 1,250 Units/hr (07/17/13 1502)    Assessment: 72 y.o. male with  a history of CAD s/p CABG (1999) here with NSTEMI on heparin and at goal (HL= 0.56). Patient noted for cath on Monday.   Goal of Therapy:  Heparin level 0.3-0.7 units/ml Monitor platelets by anticoagulation protocol: Yes   Plan:  -No heparin changes -Daily heparin level and CBC  Harland German, Pharm D 07/18/2013 8:10 AM

## 2013-07-18 NOTE — Progress Notes (Signed)
Primary MD: Rudi Heap, MD Cardiologist:  [facility]  Subjective:   [name] Cook is a 72 y.o. 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 He also has a h/o HTN, DM2. Admitted  with NSTEMI.  Recent carotid u/s 60-79% L 0-39% R  No CP or dyspnea this AM    PHYSICAL EXAM: Filed Vitals:   07/18/13 0700  BP: 155/58  Pulse:   Temp: 98.2 F (36.8 C)  Resp:    General:  Well appearing. No respiratory difficulty HEENT: normal Neck: supple. no JVD Cor: rrr. 2/6 systolic murmur Lungs: clear Abdomen: soft, nontender, nondistended.  Extremities: no edema. S/p R BKA Neuro: grossly intact  ECG: SR  Results for orders placed during the hospital encounter of 07/16/13 (from the past 24 hour(s))  GLUCOSE, CAPILLARY     Status: Abnormal   Collection Time    07/17/13  7:32 AM      Result Value Range   Glucose-Capillary 120 (*) 70 - 99 mg/dL  TROPONIN I     Status: Abnormal   Collection Time    07/17/13  9:24 AM      Result Value Range   Troponin I 2.90 (*) <0.30 ng/mL  HEPARIN LEVEL (UNFRACTIONATED)     Status: Abnormal   Collection Time    07/17/13  9:24 AM      Result Value Range   Heparin Unfractionated 0.26 (*) 0.30 - 0.70 IU/mL  GLUCOSE, CAPILLARY     Status: Abnormal   Collection Time    07/17/13 12:25 PM      Result Value Range   Glucose-Capillary 154 (*) 70 - 99 mg/dL  GLUCOSE, CAPILLARY     Status: Abnormal   Collection Time    07/17/13  5:11 PM      Result Value Range   Glucose-Capillary 119 (*) 70 - 99 mg/dL  HEPARIN LEVEL (UNFRACTIONATED)     Status: None   Collection Time    07/17/13  6:58 PM      Result Value Range   Heparin Unfractionated 0.45  0.30 - 0.70 IU/mL  GLUCOSE, CAPILLARY     Status: Abnormal   Collection Time    07/17/13  9:22 PM      Result Value Range   Glucose-Capillary 129 (*) 70 - 99 mg/dL  CBC     Status: Abnormal   Collection Time    07/18/13  4:00 AM      Result Value Range   WBC  5.1  4.0 - 10.5 K/uL   RBC 3.84 (*) 4.22 - 5.81 MIL/uL   Hemoglobin 12.6 (*) 13.0 - 17.0 g/dL   HCT 16.1 (*) 09.6 - 04.5 %   MCV 93.5  78.0 - 100.0 fL   MCH 32.8  26.0 - 34.0 pg   MCHC 35.1  30.0 - 36.0 g/dL   RDW 40.9  81.1 - 91.4 %   Platelets 220  150 - 400 K/uL  HEPARIN LEVEL (UNFRACTIONATED)     Status: None   Collection Time    07/18/13  4:00 AM      Result Value Range   Heparin Unfractionated 0.56  0.30 - 0.70 IU/mL   Dg Chest Port 1 View 07/16/2013   *RADIOLOGY REPORT*  Clinical Data: Left-sided chest pain.  Left upper extremity numbness and paresthesias.  Prior CABG.  PORTABLE CHEST - 1 VIEW  Comparison: Two-view chest x-ray 04/28/2013 [facility].  Findings: Prior sternotomy  for CABG.  Cardiac silhouette mildly enlarged but stable.  Pulmonary venous hypertension without overt edema currently.  Suboptimal inspiration which accounts for mild bibasilar atelectasis, right greater than left.  Opacity projected over the left upper lobe is likely calcified costal cartilage involving the anterior left first rib, but does appear more prominent than on the prior examination.  IMPRESSION:  1.  Suboptimal inspiration accounts for mild bibasilar atelectasis. Stable cardiomegaly.  Pulmonary venous hypertension without overt edema. 2.  Opacity projected over the left upper lobe is likely calcification involving the costal cartilage of the anterior left first rib.  It is more prominent than on the prior examination. Follow up two-view chest x-ray is suggested when the patient is clinically able.   Original Report Authenticated By: Hulan Saas, M.D.   ASSESSMENT:  1.  Non-ST elevation myocardial infarction (NSTEMI), initial episode of care 2.  DYSLIPIDEMIA 3.  HYPERTENSION 4.  GERD (gastroesophageal reflux disease) 5.  DM2 6.  Abnormal CXR 7.  Mild to moderate AS 8.  Carotid artery stenosis 9.  Hypokalemia-supplemented  PLAN/DISCUSSION:  No further CP. On for cath  Monday. Continue ASA, statin, NTG, b-blocker, heparin and IV NTG. Plan cath on Monday. Hold HCTZ. Echo shows mild to moderate AS. Will need PA and lat chest xray prior to DC.  Ripley Fraise 7:31 AM

## 2013-07-19 ENCOUNTER — Encounter (HOSPITAL_COMMUNITY)
Admission: EM | Disposition: A | Discharge status: Home / Self Care | Payer: Self-pay | Source: Home / Self Care | Attending: Cardiology

## 2013-07-19 ENCOUNTER — Encounter (HOSPITAL_COMMUNITY): Payer: Self-pay | Admitting: Nurse Practitioner

## 2013-07-19 DIAGNOSIS — I25810 Atherosclerosis of coronary artery bypass graft(s) without angina pectoris: Secondary | ICD-10-CM

## 2013-07-19 DIAGNOSIS — E1159 Type 2 diabetes mellitus with other circulatory complications: Secondary | ICD-10-CM | POA: Diagnosis present

## 2013-07-19 DIAGNOSIS — E785 Hyperlipidemia, unspecified: Secondary | ICD-10-CM

## 2013-07-19 DIAGNOSIS — E119 Type 2 diabetes mellitus without complications: Secondary | ICD-10-CM

## 2013-07-19 HISTORY — PX: PERCUTANEOUS CORONARY STENT INTERVENTION (PCI-S): SHX5485

## 2013-07-19 HISTORY — PX: LEFT HEART CATHETERIZATION WITH CORONARY/GRAFT ANGIOGRAM: SHX5450

## 2013-07-19 LAB — BASIC METABOLIC PANEL
Chloride: 102 mEq/L (ref 96–112)
GFR calc Af Amer: >90 mL/min (ref >90)
GFR calc non Af Amer: 82 mL/min — ABNORMAL LOW (ref >90)
Potassium: 3.4 mEq/L — ABNORMAL LOW (ref 3.5–5.1)
Sodium: 138 mEq/L (ref 135–145)

## 2013-07-19 LAB — CBC
Platelets: 205 10*3/uL (ref 150–400)
RBC: 3.75 MIL/uL — ABNORMAL LOW (ref 4.22–5.81)
RDW: 14.6 % (ref 11.5–15.5)
WBC: 4.8 10*3/uL (ref 4.0–10.5)

## 2013-07-19 LAB — POTASSIUM: Potassium: 3.4 mEq/L — ABNORMAL LOW (ref 3.5–5.1)

## 2013-07-19 LAB — PROTIME-INR: Prothrombin Time: 13.7 seconds (ref 11.6–15.2)

## 2013-07-19 LAB — HEPARIN LEVEL (UNFRACTIONATED): Heparin Unfractionated: 0.4 IU/mL (ref 0.30–0.70)

## 2013-07-19 LAB — GLUCOSE, CAPILLARY: Glucose-Capillary: 107 mg/dL — ABNORMAL HIGH (ref 70–99)

## 2013-07-19 LAB — POCT ACTIVATED CLOTTING TIME: Activated Clotting Time: 386 seconds

## 2013-07-19 SURGERY — LEFT HEART CATHETERIZATION WITH CORONARY/GRAFT ANGIOGRAM
Anesthesia: LOCAL

## 2013-07-19 MED ORDER — PRASUGREL HCL 10 MG PO TABS
ORAL_TABLET | ORAL | Status: AC
  Filled 2013-07-19: qty 1

## 2013-07-19 MED ORDER — ASPIRIN 81 MG PO CHEW
324.0000 mg | CHEWABLE_TABLET | ORAL | Status: AC
  Administered 2013-07-19: 324 mg via ORAL
  Filled 2013-07-19: qty 4

## 2013-07-19 MED ORDER — NITROGLYCERIN IN D5W 200-5 MCG/ML-% IV SOLN
INTRAVENOUS | Status: AC
  Filled 2013-07-19: qty 250

## 2013-07-19 MED ORDER — BIVALIRUDIN 250 MG IV SOLR
INTRAVENOUS | Status: AC
  Filled 2013-07-19: qty 250

## 2013-07-19 MED ORDER — ONDANSETRON HCL 4 MG/2ML IJ SOLN
4.0000 mg | Freq: Four times a day (QID) | INTRAMUSCULAR | Status: DC | PRN
  Administered 2013-07-19: 4 mg via INTRAVENOUS

## 2013-07-19 MED ORDER — SODIUM CHLORIDE 0.9 % IJ SOLN
3.0000 mL | Freq: Two times a day (BID) | INTRAMUSCULAR | Status: DC
  Administered 2013-07-19: 3 mL via INTRAVENOUS

## 2013-07-19 MED ORDER — HEPARIN (PORCINE) IN NACL 2-0.9 UNIT/ML-% IJ SOLN
INTRAMUSCULAR | Status: AC
  Filled 2013-07-19: qty 1000

## 2013-07-19 MED ORDER — ONDANSETRON HCL 4 MG/2ML IJ SOLN
4.0000 mg | Freq: Four times a day (QID) | INTRAMUSCULAR | Status: DC | PRN
  Filled 2013-07-19: qty 2

## 2013-07-19 MED ORDER — DIAZEPAM 2 MG PO TABS: 2.0000 mg | ORAL_TABLET | ORAL | Status: DC

## 2013-07-19 MED ORDER — SODIUM CHLORIDE 0.9 % IV SOLN
1.0000 mL/kg/h | INTRAVENOUS | Status: AC
  Administered 2013-07-19: 1 mL/kg/h via INTRAVENOUS

## 2013-07-19 MED ORDER — MIDAZOLAM HCL 2 MG/2ML IJ SOLN
INTRAMUSCULAR | Status: AC
  Filled 2013-07-19: qty 2

## 2013-07-19 MED ORDER — FENTANYL CITRATE 0.05 MG/ML IJ SOLN
INTRAMUSCULAR | Status: AC
  Filled 2013-07-19: qty 2

## 2013-07-19 MED ORDER — SODIUM CHLORIDE 0.9 % IJ SOLN: 3.0000 mL | INTRAMUSCULAR | Status: DC | PRN

## 2013-07-19 MED ORDER — LISINOPRIL 10 MG PO TABS
10.0000 mg | ORAL_TABLET | Freq: Two times a day (BID) | ORAL | Status: DC
  Administered 2013-07-19 – 2013-07-20 (×2): 10 mg via ORAL
  Filled 2013-07-19 (×4): qty 1

## 2013-07-19 MED ORDER — NITROGLYCERIN 0.2 MG/ML ON CALL CATH LAB
INTRAVENOUS | Status: AC
  Filled 2013-07-19: qty 1

## 2013-07-19 MED ORDER — ASPIRIN 81 MG PO CHEW: 324.0000 mg | CHEWABLE_TABLET | ORAL | Status: DC

## 2013-07-19 MED ORDER — SODIUM CHLORIDE 0.9 % IV SOLN: 250.0000 mL | INTRAVENOUS | Status: DC | PRN

## 2013-07-19 MED ORDER — PRASUGREL HCL 10 MG PO TABS
10.0000 mg | ORAL_TABLET | Freq: Every day | ORAL | Status: DC
  Filled 2013-07-19: qty 1

## 2013-07-19 MED ORDER — LIDOCAINE HCL (PF) 1 % IJ SOLN
INTRAMUSCULAR | Status: AC
  Filled 2013-07-19: qty 30

## 2013-07-19 MED ORDER — POTASSIUM CHLORIDE CRYS ER 20 MEQ PO TBCR
40.0000 meq | EXTENDED_RELEASE_TABLET | Freq: Once | ORAL | Status: AC
  Administered 2013-07-19: 40 meq via ORAL
  Filled 2013-07-19: qty 2

## 2013-07-19 MED ORDER — ONDANSETRON HCL 4 MG/2ML IJ SOLN
INTRAMUSCULAR | Status: AC
  Administered 2013-07-19: 4 mg via INTRAVENOUS
  Filled 2013-07-19: qty 2

## 2013-07-19 MED ORDER — PRASUGREL HCL 10 MG PO TABS
10.0000 mg | ORAL_TABLET | Freq: Every day | ORAL | Status: DC
  Administered 2013-07-20: 10 mg via ORAL
  Filled 2013-07-19: qty 1

## 2013-07-19 NOTE — Progress Notes (Signed)
Site area: right groin  Site Prior to Removal:  Level 0  Pressure Applied For 20 MINUTES    Minutes Beginning at 1555  Manual:   yes  Patient Status During Pull:  stable  Post Pull Groin Site:  Level 0  Post Pull Instructions Given:  yes  Post Pull Pulses Present:  yes  Dressing Applied:  yes  Comments:  Gauze pressure dressing applied with medipore tape.

## 2013-07-19 NOTE — Care Management Note (Signed)
    Page 1 of 1   07/19/2013     11:55:37 AM   CARE MANAGEMENT NOTE 07/19/2013  Patient:  [name] Cook,[name] Cook   Account Number:  [id number]  Date Initiated:  07/19/2013  Documentation initiated by:  Junius Creamer  Subjective/Objective Assessment:   adm w mi     Action/Plan:   lives w wife, pcp dr don Christell Constant   Anticipated DC Date:     Anticipated DC Plan:        DC Planning Services  CM consult      Choice offered to / List presented to:             Status of service:   Medicare Important Message given?   (If response is "NO", the following Medicare IM given date fields will be blank) Date Medicare IM given:   Date Additional Medicare IM given:    Discharge Disposition:    Per UR Regulation:  Reviewed for med. necessity/level of care/duration of stay  If discussed at Long Length of Stay Meetings, dates discussed:    Comments:

## 2013-07-19 NOTE — CV Procedure (Signed)
  Cardiac Catheterization Procedure Note  Name: [name] Cook MRN: [id number] DOB: [birth year]  Procedure: Left Heart Cath, Selective Coronary Angiography, LV angiography  Indication:    Procedural details: The right groin was prepped, draped, and anesthetized with 1% lidocaine. Using modified Seldinger technique, a 5 French sheath was introduced into the right femoral artery. Standard Judkins catheters were used for coronary angiography and left ventriculography. Catheter exchanges were performed over a guidewire. There were no immediate procedural complications. The patient was transferred to the post catheterization recovery area for further monitoring.  Procedural Findings:  Hemodynamics:     AO 156/75    LV 161/11   Coronary angiography:  Coronary dominance: Right  Left mainstem:   Distal long 75% stenosis.    Left anterior descending (LAD):   Long proximal/mid 80% stenosis.  Mid subtotal stenosis.  The distal vessel has nonobstructive disease as the vessel wraps the apex.    Left circumflex (LCx):  MOM perfused vis SVG.  It is a branching vessel with ostial occlusion and severe proximal stenosis.  The graft is sewn to the superior branch and back fills diffusely disease inferior branches and proximal vessel across 99% stenosis.    Right coronary artery (RCA):  100% mid stenosis with bridging and left to right collateral flow.    Grafts:  LIMA to LAD:  Widely patent  SVG to RCA:  Occluded  SVG to RI:  Occluded  SVG to OM1/OM2: This is patent to an OM there are not sequential anastomosis noted.  The graft has a proximal focal 99% stenosis.    Left ventriculography: Left ventricular systolic function is mildly reduced, LVEF is estimated at 60% with inferior mild/moderate akinesis, there is no significant mitral regurgitation   Final Conclusions:   Severe 3 vessel CAD with 2 of 5 grafts patent.  High grade stenosis in the SVG to OM.  Preserved EF.  Recommendations:   PCI  of OM.    [name] Cook 07/19/2013, 3:38 PM

## 2013-07-19 NOTE — Progress Notes (Signed)
Patient Name: [name] Cook Date of Encounter: 07/19/2013   Principal Problem:   Non-ST elevation myocardial infarction (NSTEMI), initial episode of care Active Problems:   CAD   DYSLIPIDEMIA   HYPERTENSION   Hypokalemia   Diabetes mellitus without complication   GERD (gastroesophageal reflux disease)   SUBJECTIVE  No chest pain or sob.  For cath today.  CURRENT MEDS . acidophilus  1 capsule Oral Daily  . amLODipine  5 mg Oral Daily  . aspirin  81 mg Oral Daily  . atorvastatin  40 mg Oral q1800  . calcium-vitamin D  1 tablet Oral Q breakfast  . diazepam  2 mg Oral On Call  . famotidine  20 mg Oral BID  . finasteride  5 mg Oral Daily  . insulin aspart  0-15 Units Subcutaneous TID WC  . insulin aspart  0-5 Units Subcutaneous QHS  . lisinopril  5 mg Oral BID  . metoprolol succinate  50 mg Oral Daily  . multivitamin with minerals  1 tablet Oral Daily  . pantoprazole  40 mg Oral Daily  . potassium chloride  40 mEq Oral Once  . sodium chloride  3 mL Intravenous Q12H  . sodium chloride  3 mL Intravenous Q12H  . tamsulosin  0.4 mg Oral Daily    OBJECTIVE  Filed Vitals:   07/19/13 0256 07/19/13 0310 07/19/13 0315 07/19/13 0345  BP:  178/65 169/60 164/57  Pulse:      Temp:      TempSrc:      Resp:  16 16   Height:      Weight: 201 lb 15.1 oz (91.6 kg)     SpO2:        Intake/Output Summary (Last 24 hours) at 07/19/13 4098 Last data filed at 07/19/13 0600  Gross per 24 hour  Intake 2485.78 ml  Output    600 ml  Net 1885.78 ml   Filed Weights   07/17/13 0500 07/18/13 0345 07/19/13 0256  Weight: 202 lb 9.6 oz (91.9 kg) 202 lb 6.1 oz (91.8 kg) 201 lb 15.1 oz (91.6 kg)    PHYSICAL EXAM  General: Pleasant, NAD. Neuro: Alert and oriented X 3. Moves all extremities spontaneously. Psych: Normal affect. HEENT:  Normal  Neck: Supple without JVD, bilat bruits. Lungs:  Resp regular and unlabored, CTA. Heart: RRR no s3, s4.  2/6 SEM RUSB. Abdomen: Soft,  non-tender, non-distended, BS + x 4.  Extremities: No clubbing, cyanosis or edema. Radials 2+ and equal bilaterally.  R femoral 2+, no bruit.  Accessory Clinical Findings  CBC  Recent Labs  07/18/13 0400 07/19/13 0445  WBC 5.1 4.8  HGB 12.6* 12.3*  HCT 35.9* 35.0*  MCV 93.5 93.3  PLT 220 205   Basic Metabolic Panel  Recent Labs  07/16/13 1930 07/16/13 2215 07/17/13 0400 07/19/13 0445  NA 138  --  140 138  K 3.5  --  3.1* 3.4*  CL 103  --  103 102  CO2 28  --  27 26  GLUCOSE 107*  --  114* 116*  BUN 15  --  12 10  CREATININE 1.01  --  0.90 0.94  CALCIUM 8.7  --  8.7 8.5  MG  --  1.8  --   --    Liver Function Tests  Recent Labs  07/17/13 0400  AST 38*  ALT 24  ALKPHOS 27*  BILITOT 0.5  PROT 6.2  ALBUMIN 3.1*   Cardiac Enzymes  Recent Labs  07/16/13  2215 07/17/13 0400 07/17/13 0924  TROPONINI 2.32* 2.86* 2.90*   Hemoglobin A1C  Recent Labs  07/16/13 2215  HGBA1C 5.5   Fasting Lipid Panel  Recent Labs  07/17/13 0400  CHOL 123  HDL 41  LDLCALC 49  TRIG 163*  CHOLHDL 3.0   Thyroid Function Tests  Recent Labs  07/16/13 2215  TSH 3.173    TELE  Rsr, pvc's.  Radiology/Studies  Dg Chest Port 1 View  07/16/2013   *RADIOLOGY REPORT*  Clinical Data: Left-sided chest pain.  Left upper extremity numbness and paresthesias.  Prior CABG.  PORTABLE CHEST - 1 VIEW    IMPRESSION:  1.  Suboptimal inspiration accounts for mild bibasilar atelectasis. Stable cardiomegaly.  Pulmonary venous hypertension without overt edema. 2.  Opacity projected over the left upper lobe is likely calcification involving the costal cartilage of the anterior left first rib.  It is more prominent than on the prior examination. Follow up two-view chest x-ray is suggested when the patient is clinically able.   Original Report Authenticated By: Hulan Saas, M.D.    ASSESSMENT AND PLAN  1.  NSTEMI/CAD:  No further c/p.  Nl EF by echo.  For cath today.  Cont asa,  statin ,bb, heparin.  Cardiac rehab to see post cath (+/- PCI).  2.  HTN:  BP trending high, 160's to 170's.  Will titrate lisinopril to 10mg  bid.  Cont bb/ccb.  3.  HL:  LDL=49.  Cont mod dose statin.  4.  DM:  Cont SSI.  A1c 5.5.  5.  Hypokalemia:  Supp.  F/u bmet in AM.  6.  Ao Stenosis:  Mild to mod AS by echo 8/23.  Nl EF.  7.  Carotid Dzs:  Recent u/s 07/01/13 - RICA 1-39%, LICA 60-79% - f/u 6 months rec.  Cont asa/statin.  8.  Abnl CXR:  rec 2 view prior to d/c.  Will order for tomorrow.  Signed, Nicolasa Ducking NP   History and all data above reviewed.  Patient examined.  I agree with the findings as above.  No further chest pain. The patient exam reveals COR:RRR  ,  Lungs: Clear  ,  Abd: Positive bowel sounds, no rebound no guarding, Ext No edema  .  All available labs, radiology testing, previous records reviewed. Agree with documented assessment and plan. Cath today.  The patient understands that risks included but are not limited to stroke (1 in 1000), death (1 in 1000), kidney failure [usually temporary] (1 in 500), bleeding (1 in 200), allergic reaction [possibly serious] (1 in 200).  The patient understands and agrees to proceed.   Rollene Rotunda  7:43 AM  07/19/2013

## 2013-07-19 NOTE — CV Procedure (Signed)
   CARDIAC CATH NOTE  Name: [name] Cook MRN: [id number] DOB: [birth year]  Procedure: PTCA and stenting of the SVG to the OM.  Indication: 72 yo WM with history of CAD s/p CABG 15 years ago presents with a NSTEMI. Diagnostic cardiac cath demonstrated occlusion of the SVGs to the intermediate and to the PDA. The LIMA to the LAD was patent. The SVG to the OM has a 90% stenosis in the proximal graft body. The native RCA is occluded. After consultation with Dr. Antoine Poche PCI of the SVG to the OM was recommended.  Procedural Details: A 6 Fr sheath was exchanged for the diagnostic sheath in the right femoral artery.  Weight-based bivalirudin was given for anticoagulation. Effient 60 mg was given PO. Once a therapeutic ACT was achieved, a 6 [place] LA1 guide catheter was inserted.  A filter coronary guidewire was used to cross the lesion.  The lesion was predilated with a 2.25 mm  balloon.  The lesion was then stented with a 3.5 x 16 mm Promus premier stent.   Following PCI, there was 0% residual stenosis and TIMI-3 flow. Final angiography confirmed an excellent result. The filter wire was retrieved without difficulty.  The patient tolerated the procedure well. There were no immediate procedural complications. Femoral hemostasis was achieved with manual compression. The patient was transferred to the post catheterization recovery area for further monitoring.  Lesion Data: Vessel: SVG to OM Percent stenosis (pre): 90% TIMI-flow (pre):  3 Stent:  3.5 x 16 mm Promus premier Percent stenosis (post): 0% TIMI-flow (post): 3  Conclusions: Successful stenting of the SVG to the OM with a DES.  Recommendations: Dual antiplatelet therapy for one year.   [name] [facility] 07/19/2013, 12:35 PM

## 2013-07-19 NOTE — H&P (View-Only) (Signed)
 Patient Name: [name] Cook Date of Encounter: 07/19/2013   Principal Problem:   Non-ST elevation myocardial infarction (NSTEMI), initial episode of care Active Problems:   CAD   DYSLIPIDEMIA   HYPERTENSION   Hypokalemia   Diabetes mellitus without complication   GERD (gastroesophageal reflux disease)   SUBJECTIVE  No chest pain or sob.  For cath today.  CURRENT MEDS . acidophilus  1 capsule Oral Daily  . amLODipine  5 mg Oral Daily  . aspirin  81 mg Oral Daily  . atorvastatin  40 mg Oral q1800  . calcium-vitamin D  1 tablet Oral Q breakfast  . diazepam  2 mg Oral On Call  . famotidine  20 mg Oral BID  . finasteride  5 mg Oral Daily  . insulin aspart  0-15 Units Subcutaneous TID WC  . insulin aspart  0-5 Units Subcutaneous QHS  . lisinopril  5 mg Oral BID  . metoprolol succinate  50 mg Oral Daily  . multivitamin with minerals  1 tablet Oral Daily  . pantoprazole  40 mg Oral Daily  . potassium chloride  40 mEq Oral Once  . sodium chloride  3 mL Intravenous Q12H  . sodium chloride  3 mL Intravenous Q12H  . tamsulosin  0.4 mg Oral Daily    OBJECTIVE  Filed Vitals:   07/19/13 0256 07/19/13 0310 07/19/13 0315 07/19/13 0345  BP:  178/65 169/60 164/57  Pulse:      Temp:      TempSrc:      Resp:  16 16   Height:      Weight: 201 lb 15.1 oz (91.6 kg)     SpO2:        Intake/Output Summary (Last 24 hours) at 07/19/13 0702 Last data filed at 07/19/13 0600  Gross per 24 hour  Intake 2485.78 ml  Output    600 ml  Net 1885.78 ml   Filed Weights   07/17/13 0500 07/18/13 0345 07/19/13 0256  Weight: 202 lb 9.6 oz (91.9 kg) 202 lb 6.1 oz (91.8 kg) 201 lb 15.1 oz (91.6 kg)    PHYSICAL EXAM  General: Pleasant, NAD. Neuro: Alert and oriented X 3. Moves all extremities spontaneously. Psych: Normal affect. HEENT:  Normal  Neck: Supple without JVD, bilat bruits. Lungs:  Resp regular and unlabored, CTA. Heart: RRR no s3, s4.  2/6 SEM RUSB. Abdomen: Soft,  non-tender, non-distended, BS + x 4.  Extremities: No clubbing, cyanosis or edema. Radials 2+ and equal bilaterally.  R femoral 2+, no bruit.  Accessory Clinical Findings  CBC  Recent Labs  07/18/13 0400 07/19/13 0445  WBC 5.1 4.8  HGB 12.6* 12.3*  HCT 35.9* 35.0*  MCV 93.5 93.3  PLT 220 205   Basic Metabolic Panel  Recent Labs  07/16/13 1930 07/16/13 2215 07/17/13 0400 07/19/13 0445  NA 138  --  140 138  K 3.5  --  3.1* 3.4*  CL 103  --  103 102  CO2 28  --  27 26  GLUCOSE 107*  --  114* 116*  BUN 15  --  12 10  CREATININE 1.01  --  0.90 0.94  CALCIUM 8.7  --  8.7 8.5  MG  --  1.8  --   --    Liver Function Tests  Recent Labs  07/17/13 0400  AST 38*  ALT 24  ALKPHOS 27*  BILITOT 0.5  PROT 6.2  ALBUMIN 3.1*   Cardiac Enzymes  Recent Labs  07/16/13   2215 07/17/13 0400 07/17/13 0924  TROPONINI 2.32* 2.86* 2.90*   Hemoglobin A1C  Recent Labs  07/16/13 2215  HGBA1C 5.5   Fasting Lipid Panel  Recent Labs  07/17/13 0400  CHOL 123  HDL 41  LDLCALC 49  TRIG 163*  CHOLHDL 3.0   Thyroid Function Tests  Recent Labs  07/16/13 2215  TSH 3.173    TELE  Rsr, pvc's.  Radiology/Studies  Dg Chest Port 1 View  07/16/2013   *RADIOLOGY REPORT*  Clinical Data: Left-sided chest pain.  Left upper extremity numbness and paresthesias.  Prior CABG.  PORTABLE CHEST - 1 VIEW    IMPRESSION:  1.  Suboptimal inspiration accounts for mild bibasilar atelectasis. Stable cardiomegaly.  Pulmonary venous hypertension without overt edema. 2.  Opacity projected over the left upper lobe is likely calcification involving the costal cartilage of the anterior left first rib.  It is more prominent than on the prior examination. Follow up two-view chest x-ray is suggested when the patient is clinically able.   Original Report Authenticated By: Thomas Lawrence, M.D.    ASSESSMENT AND PLAN  1.  NSTEMI/CAD:  No further c/p.  Nl EF by echo.  For cath today.  Cont asa,  statin ,bb, heparin.  Cardiac rehab to see post cath (+/- PCI).  2.  HTN:  BP trending high, 160's to 170's.  Will titrate lisinopril to 10mg bid.  Cont bb/ccb.  3.  HL:  LDL=49.  Cont mod dose statin.  4.  DM:  Cont SSI.  A1c 5.5.  5.  Hypokalemia:  Supp.  F/u bmet in AM.  6.  Ao Stenosis:  Mild to mod AS by echo 8/23.  Nl EF.  7.  Carotid Dzs:  Recent u/s 07/01/13 - RICA 1-39%, LICA 60-79% - f/u 6 months rec.  Cont asa/statin.  8.  Abnl CXR:  rec 2 view prior to d/c.  Will order for tomorrow.  Signed, Christopher Berge NP   History and all data above reviewed.  Patient examined.  I agree with the findings as above.  No further chest pain. The patient exam reveals COR:RRR  ,  Lungs: Clear  ,  Abd: Positive bowel sounds, no rebound no guarding, Ext No edema  .  All available labs, radiology testing, previous records reviewed. Agree with documented assessment and plan. Cath today.  The patient understands that risks included but are not limited to stroke (1 in 1000), death (1 in 1000), kidney failure [usually temporary] (1 in 500), bleeding (1 in 200), allergic reaction [possibly serious] (1 in 200).  The patient understands and agrees to proceed.   [name] Cook  7:43 AM  07/19/2013  

## 2013-07-19 NOTE — Progress Notes (Signed)
ANTICOAGULATION CONSULT NOTE - Follow Up Consult  Pharmacy Consult for heparin Indication: chest pain/ACS  Allergies  Allergen Reactions  . Altace [Ramipril] Other (See Comments)    hypotension    Patient Measurements: Height: 5\' 8"  (172.7 cm) Weight: 201 lb 15.1 oz (91.6 kg) IBW/kg (Calculated) : 68.4 Heparin Dosing Weight: 88kg  Vital Signs: Temp: 97.5 F (36.4 C) (08/25 0745) Temp src: Oral (08/25 0745) BP: 164/57 mmHg (08/25 0345)  Labs:  Recent Labs  07/16/13 1835 07/16/13 1930 07/16/13 2215 07/17/13 0400  07/17/13 0924 07/17/13 1858 07/18/13 0400 07/19/13 0445  HGB 12.3*  --   --   --   --   --   --  12.6* 12.3*  HCT 35.8*  --   --   --   --   --   --  35.9* 35.0*  PLT 241  --   --   --   --   --   --  220 205  APTT  --   --  149*  --   --   --   --   --   --   LABPROT  --   --  13.3  --   --   --   --   --  13.7  INR  --   --  1.03  --   --   --   --   --  1.07  HEPARINUNFRC  --   --   --   --   < > 0.26* 0.45 0.56 0.40  CREATININE  --  1.01  --  0.90  --   --   --   --  0.94  TROPONINI  --   --  2.32* 2.86*  --  2.90*  --   --   --   < > = values in this interval not displayed.  Estimated Creatinine Clearance: 79.2 ml/min (by C-G formula based on Cr of 0.94).   Medications:  Scheduled:  . acidophilus  1 capsule Oral Daily  . amLODipine  5 mg Oral Daily  . aspirin  81 mg Oral Daily  . atorvastatin  40 mg Oral q1800  . calcium-vitamin D  1 tablet Oral Q breakfast  . diazepam  2 mg Oral On Call  . famotidine  20 mg Oral BID  . finasteride  5 mg Oral Daily  . insulin aspart  0-15 Units Subcutaneous TID WC  . insulin aspart  0-5 Units Subcutaneous QHS  . lisinopril  5 mg Oral BID  . metoprolol succinate  50 mg Oral Daily  . multivitamin with minerals  1 tablet Oral Daily  . nitroGLYCERIN      . ondansetron      . pantoprazole  40 mg Oral Daily  . potassium chloride  40 mEq Oral Once  . sodium chloride  3 mL Intravenous Q12H  . sodium chloride  3  mL Intravenous Q12H  . tamsulosin  0.4 mg Oral Daily   Infusions:  . sodium chloride 1 mL/kg/hr (07/18/13 1959)  . heparin 1,250 Units/hr (07/18/13 2000)    Assessment: 72 y.o. male with a history of CAD s/p CABG (1999) here with NSTEMI on heparin and at goal (HL= 0.4). Patient noted for cath today.   Goal of Therapy:  Heparin level 0.3-0.7 units/ml Monitor platelets by anticoagulation protocol: Yes   Plan:  -No heparin changes -Daily heparin level and CBC  Harland German, Pharm D 07/19/2013 7:57 AM

## 2013-07-19 NOTE — Interval H&P Note (Signed)
Cath Lab Visit (complete for each Cath Lab visit)  Clinical Evaluation Leading to the Procedure:   ACS: yes  Non-ACS:    Anginal Classification: CCS IV  Anti-ischemic medical therapy: Maximal Therapy (2 or more classes of medications)  Non-Invasive Test Results: No non-invasive testing performed  Prior CABG: Previous CABG      History and Physical Interval Note:  07/19/2013 11:25 AM  [name] Cook  has presented today for surgery, with the diagnosis of cp  The various methods of treatment have been discussed with the patient and family. After consideration of risks, benefits and other options for treatment, the patient has consented to  Procedure(s): LEFT HEART CATHETERIZATION WITH CORONARY/GRAFT ANGIOGRAM (N/A) as a surgical intervention .  The patient's history has been reviewed, patient examined, no change in status, stable for surgery.  I have reviewed the patient's chart and labs.  Questions were answered to the patient's satisfaction.     Rollene Rotunda

## 2013-07-20 ENCOUNTER — Inpatient Hospital Stay (HOSPITAL_COMMUNITY): Payer: Medicare Other

## 2013-07-20 LAB — CBC
MCHC: 33.5 g/dL (ref 30.0–36.0)
MCV: 95.6 fL (ref 78.0–100.0)
Platelets: 180 10*3/uL (ref 150–400)
RDW: 15 % (ref 11.5–15.5)
WBC: 5.3 10*3/uL (ref 4.0–10.5)

## 2013-07-20 LAB — BASIC METABOLIC PANEL
Chloride: 107 mEq/L (ref 96–112)
Creatinine, Ser: 0.97 mg/dL (ref 0.50–1.35)
GFR calc Af Amer: >90 mL/min (ref >90)
GFR calc non Af Amer: 81 mL/min — ABNORMAL LOW (ref >90)
Potassium: 4.1 mEq/L (ref 3.5–5.1)

## 2013-07-20 MED ORDER — METFORMIN HCL 500 MG PO TABS: 500.0000 mg | ORAL_TABLET | Freq: Every day | ORAL | Status: DC

## 2013-07-20 MED ORDER — NITROGLYCERIN 0.4 MG SL SUBL: 0.4000 mg | SUBLINGUAL_TABLET | SUBLINGUAL | Status: DC | PRN

## 2013-07-20 MED ORDER — LISINOPRIL 10 MG PO TABS: 10.0000 mg | ORAL_TABLET | Freq: Two times a day (BID) | ORAL | Status: DC

## 2013-07-20 MED ORDER — AMLODIPINE BESYLATE 5 MG PO TABS: 5.0000 mg | ORAL_TABLET | Freq: Every day | ORAL | Status: DC

## 2013-07-20 MED ORDER — PRASUGREL HCL 10 MG PO TABS: 10.0000 mg | ORAL_TABLET | Freq: Every day | ORAL | Status: DC

## 2013-07-20 NOTE — Progress Notes (Signed)
CARDIAC REHAB PHASE I   PRE:  Rate/Rhythm: 81 SR    BP: sitting 163/76    SaO2:   MODE:  Ambulation: 500 (including incline) ft   POST:  Rate/Rhythm: 117 ST    BP: sitting 142/69     SaO2:   Pt able to walk without CP. HR up to 117 ST, presumably due to increased work with prosthesis (also had not had meds). Pt sts his BP always decreases with ex. Pt sts he has struggled exercising since having prosthesis. If he walks long distance, he will have to rest several days due to friction and developing sores on stump. Pt willing to try CRPII (bike, stepper, etc) to see if he can tolerate. Will send referral to [place]. Good reception of ed. [phone]   Harriet Masson CES, ACSM 07/20/2013 9:53 AM

## 2013-07-20 NOTE — Progress Notes (Signed)
SUBJECTIVE:  No complaints overnight.  OK to go home   PHYSICAL EXAM Filed Vitals:   07/19/13 1900 07/19/13 2000 07/20/13 0016 07/20/13 0432  BP: 152/52 161/59 142/53 159/54  Pulse: 69 83 80 78  Temp:  97.8 F (36.6 C) 98.9 F (37.2 C) 98.3 F (36.8 C)  TempSrc:  Oral Oral Oral  Resp:  16 18 16   Height:      Weight:    203 lb 14.8 oz (92.5 kg)  SpO2: 92% 92% 95% 95%   General:  No distress Lungs:  Clear Heart:  RRR Abdomen:  Positive bowel sounds, no rebound no guarding Extremities:  Mild bruising  LABS:  Results for orders placed during the hospital encounter of 07/16/13 (from the past 24 hour(s))  GLUCOSE, CAPILLARY     Status: Abnormal   Collection Time    07/19/13  7:48 AM      Result Value Range   Glucose-Capillary 133 (*) 70 - 99 mg/dL   Comment 1 Notify RN    POTASSIUM     Status: Abnormal   Collection Time    07/19/13  8:55 AM      Result Value Range   Potassium 3.4 (*) 3.5 - 5.1 mEq/L  POCT ACTIVATED CLOTTING TIME     Status: None   Collection Time    07/19/13 12:20 PM      Result Value Range   Activated Clotting Time 386    GLUCOSE, CAPILLARY     Status: Abnormal   Collection Time    07/19/13  4:16 PM      Result Value Range   Glucose-Capillary 107 (*) 70 - 99 mg/dL  GLUCOSE, CAPILLARY     Status: Abnormal   Collection Time    07/19/13 10:55 PM      Result Value Range   Glucose-Capillary 136 (*) 70 - 99 mg/dL   Comment 1 Notify RN     Comment 2 Documented in Chart    CBC     Status: Abnormal   Collection Time    07/20/13  4:07 AM      Result Value Range   WBC 5.3  4.0 - 10.5 K/uL   RBC 3.65 (*) 4.22 - 5.81 MIL/uL   Hemoglobin 11.7 (*) 13.0 - 17.0 g/dL   HCT 16.1 (*) 09.6 - 04.5 %   MCV 95.6  78.0 - 100.0 fL   MCH 32.1  26.0 - 34.0 pg   MCHC 33.5  30.0 - 36.0 g/dL   RDW 40.9  81.1 - 91.4 %   Platelets 180  150 - 400 K/uL  BASIC METABOLIC PANEL     Status: Abnormal   Collection Time    07/20/13  4:07 AM      Result Value Range   Sodium 141  135 - 145 mEq/L   Potassium 4.1  3.5 - 5.1 mEq/L   Chloride 107  96 - 112 mEq/L   CO2 26  19 - 32 mEq/L   Glucose, Bld 94  70 - 99 mg/dL   BUN 10  6 - 23 mg/dL   Creatinine, Ser 7.82  0.50 - 1.35 mg/dL   Calcium 8.6  8.4 - 95.6 mg/dL   GFR calc non Af Amer 81 (*) >90 mL/min   GFR calc Af Amer >90  >90 mL/min    Intake/Output Summary (Last 24 hours) at 07/20/13 2130 Last data filed at 07/20/13 0000  Gross per 24 hour  Intake 1852.35 ml  Output  401 ml  Net 1451.35 ml     ASSESSMENT AND PLAN:   Non-ST elevation myocardial infarction (NSTEMI), initial episode of care:  Status post PCI of SVG to OM.  DAPT for one year.    DYSLIPIDEMIA:  Follow up per Rudi Heap, MD.  LDL 49  HYPERTENSION:  Blood pressure near target.  Continue current therapy.   DM:  Follow up per Rudi Heap, MD   Rollene Rotunda 07/20/2013 6:21 AM

## 2013-07-20 NOTE — Discharge Summary (Signed)
CARDIOLOGY DISCHARGE SUMMARY   Patient ID: [name] Cook MRN: [id number] DOB/AGE: 06-09-1941 72 y.o.   Admit date: 07/16/2013 Discharge date: 07/20/2013  Primary Discharge Diagnosis:   Non-ST elevation myocardial infarction (NSTEMI), initial episode of care  Secondary Discharge Diagnosis:    DYSLIPIDEMIA and was and was   HYPERTENSION   CAD   GERD (gastroesophageal reflux disease)   Hypokalemia   Diabetes mellitus without complication  Procedures:  Left Heart Cath, Selective Coronary Angiography, LV angiography, PTCA and stenting of the SVG to the OM, 2D echocardiogram   Hospital Course: [name] Cook is a 72 y.o. male with a history of CAD. He had chest pain that started at rest and was recurrent. He came to the emergency room when the pain became more severe and he had associated symptoms. He received nitroglycerin x2 with relief. His point-of-care troponin was elevated. He was hypokalemic but this was supplemented. He was admitted for further evaluation and treatment.  His cardiac enzymes elevated indicating a non-ST segment elevation MI. He was pain-free on medical therapy that included aspirin, heparin, and nitrates. A 2-D echocardiogram was performed, results below. He has aortic stenosis but it is not severe. He was taken to the cath lab on 04/18/2013, results are below.  He had a 99% stenosis in the vein graft to the OM. This was treated with PTCA and a drug-eluting stent. Other disease is to be managed medically. He tolerated the procedure well.  On 07/20/2013, he was seen by Dr. Antoine Poche and by cardiac rehabilitation. Cardiac rehabilitation ambulated him and educated him on MI restrictions, exercise guidelines and heart healthy lifestyle. He is encouraged to followup with cardiac rehabilitation as an outpatient. Dr. Antoine Poche evaluated [name] Cook and reviewed all data. He felt that no other inpatient workup was indicated to [name] Cook is considered stable for  discharge, to follow up as an outpatient.  Labs:   Lab Results  Component Value Date   WBC 5.3 07/20/2013   HGB 11.7* 07/20/2013   HCT 34.9* 07/20/2013   MCV 95.6 07/20/2013   PLT 180 07/20/2013    Recent Labs Lab 07/17/13 0400  07/20/13 0407  NA 140  < > 141  K 3.1*  < > 4.1  CL 103  < > 107  CO2 27  < > 26  BUN 12  < > 10  CREATININE 0.90  < > 0.97  CALCIUM 8.7  < > 8.6  PROT 6.2  --   --   BILITOT 0.5  --   --   ALKPHOS 27*  --   --   ALT 24  --   --   AST 38*  --   --   GLUCOSE 114*  < > 94  < > = values in this interval not displayed.  Lipid Panel     Component Value Date/Time   CHOL 123 07/17/2013 0400   TRIG 163* 07/17/2013 0400   HDL 41 07/17/2013 0400   CHOLHDL 3.0 07/17/2013 0400   VLDL 33 07/17/2013 0400   LDLCALC 49 07/17/2013 0400   Lab Results  Component Value Date   TROPONINI 2.90* 07/17/2013   Pro B Natriuretic peptide (BNP)  Date/Time Value Range Status  07/16/2013  7:30 PM 1113.0* 0 - 125 pg/mL Final    Recent Labs  07/19/13 0445  INR 1.07      Radiology: Dg Chest 2 View 07/20/2013   *RADIOLOGY REPORT*  Clinical Data: Follow-up abnormal portable chest x-ray  CHEST - 2 VIEW  Comparison: July 16, 2013  Findings: There are minimal bilateral posterior pleural effusions. There is no focal pneumonia or pulmonary edema.  The patient is status post prior CABG and median sternotomy.  The heart size is normal.  The soft tissues and osseous structures are stable. Osteophytosis of bilateral first rib manubrial junction are identified.  IMPRESSION: Minimal bilateral pleural effusions.   Original Report Authenticated By: Sherian Rein, M.D.   Dg Chest Port 1 View 07/16/2013   *RADIOLOGY REPORT*  Clinical Data: Left-sided chest pain.  Left upper extremity numbness and paresthesias.  Prior CABG.  PORTABLE CHEST - 1 VIEW  Comparison: Two-view chest x-ray 04/28/2013 [facility].  Findings: Prior sternotomy for CABG.  Cardiac silhouette mildly  enlarged but stable.  Pulmonary venous hypertension without overt edema currently.  Suboptimal inspiration which accounts for mild bibasilar atelectasis, right greater than left.  Opacity projected over the left upper lobe is likely calcified costal cartilage involving the anterior left first rib, but does appear more prominent than on the prior examination.  IMPRESSION:  1.  Suboptimal inspiration accounts for mild bibasilar atelectasis. Stable cardiomegaly.  Pulmonary venous hypertension without overt edema. 2.  Opacity projected over the left upper lobe is likely calcification involving the costal cartilage of the anterior left first rib.  It is more prominent than on the prior examination. Follow up two-view chest x-ray is suggested when the patient is clinically able.   Original Report Authenticated By: Hulan Saas, M.D.    Cardiac Cath: 07/19/2013 Left mainstem: Distal long 75% stenosis.  Left anterior descending (LAD): Long proximal/mid 80% stenosis. Mid subtotal stenosis. The distal vessel has nonobstructive disease as the vessel wraps the apex.  Left circumflex (LCx): MOM perfused vis SVG. It is a branching vessel with ostial occlusion and severe proximal stenosis. The graft is sewn to the superior branch and back fills diffusely disease inferior branches and proximal vessel across 99% stenosis.  Right coronary artery (RCA): 100% mid stenosis with bridging and left to right collateral flow.  Grafts:  LIMA to LAD: Widely patent  SVG to RCA: Occluded  SVG to RI: Occluded  SVG to OM1/OM2: This is patent to an OM there are not sequential anastomosis noted. The graft has a proximal focal 99% stenosis. Lesion Data:  Vessel: SVG to OM  Percent stenosis (pre): 90%  TIMI-flow (pre): 3  Stent: 3.5 x 16 mm Promus premier  Percent stenosis (post): 0%  TIMI-flow (post): 3  Conclusions: Successful stenting of the SVG to the OM with a DES.  Recommendations: Dual antiplatelet therapy for one  year  EKG: 07/20/2013 Sinus rhythm, no acute ischemic changes Vent. rate 80 BPM PR interval 214 ms QRS duration 116 ms QT/QTc 412/475 ms P-R-T axes 54 7 153  Echo: 07/17/2013 - Left ventricle: The cavity size was normal. Wall thickness was increased in a pattern of mild LVH. Systolic function was normal. The estimated ejection fraction was in the range of 60% to 65%. There was an increased relative contribution of atrial contraction to ventricular filling. Doppler parameters are consistent with abnormal left ventricular relaxation (grade 1 diastolic dysfunction). Doppler parameters are consistent with elevated ventricular end-diastolic filling pressure. - Aortic valve: There is mild to moderate aortic stenosis, with a maximally estimated mean gradient of 19 mmHg. Mildly thickened, mildly calcified leaflets. Mild regurgitation. Valve area: 1.2cm^2(VTI). Valve area: 1.28cm^2 (Vmax). Mean gradient: 19mm Hg (S). Peak gradient: 32mm Hg (S).   FOLLOW UP PLANS AND APPOINTMENTS Allergies  Allergen Reactions  .  Altace [Ramipril] Other (See Comments)    hypotension     Medication List    STOP taking these medications       ibuprofen 200 MG tablet  Commonly known as:  ADVIL,MOTRIN      TAKE these medications       amLODipine 5 MG tablet  Commonly known as:  NORVASC  Take 1 tablet (5 mg total) by mouth daily.     aspirin 81 MG tablet  Take 81 mg by mouth daily.     atorvastatin 40 MG tablet  Commonly known as:  LIPITOR  Take 1 tablet (40 mg total) by mouth daily.     CALCIUM-CARB 600 + D 600-125 MG-UNIT Tabs  Generic drug:  Calcium Carbonate-Vitamin D  Take 1 tablet by mouth daily.     finasteride 5 MG tablet  Commonly known as:  PROSCAR  Take 5 mg by mouth daily.     hydrochlorothiazide 25 MG tablet  Commonly known as:  HYDRODIURIL  Take 1 tablet (25 mg total) by mouth daily.     lisinopril 10 MG tablet  Commonly known as:  PRINIVIL,ZESTRIL  Take 1 tablet  (10 mg total) by mouth 2 (two) times daily.     metFORMIN 500 MG tablet  Commonly known as:  GLUCOPHAGE  Take 1 tablet (500 mg total) by mouth daily. HOLD for 48 hours, restart on 07/22/2013     metoprolol succinate 50 MG 24 hr tablet  Commonly known as:  TOPROL-XL  Take 1 tablet (50 mg total) by mouth daily.     multivitamin tablet  Take 1 tablet by mouth daily.     nitroGLYCERIN 0.4 MG SL tablet  Commonly known as:  NITROSTAT  Place 1 tablet (0.4 mg total) under the tongue every 5 (five) minutes as needed for chest pain.     NON FORMULARY  Take 10-15 mLs by mouth as needed (for sleep). z quill at bedtime     omeprazole 20 MG capsule  Commonly known as:  PRILOSEC  Take 20 mg by mouth daily.     prasugrel 10 MG Tabs tablet  Commonly known as:  EFFIENT  Take 1 tablet (10 mg total) by mouth daily.     ranitidine 75 MG tablet  Commonly known as:  ZANTAC  Take 75 mg by mouth 2 (two) times daily.     RESTORA PO  Take by mouth.     simethicone 80 MG chewable tablet  Commonly known as:  GAS-X  Chew 1 tablet (80 mg total) by mouth every 6 (six) hours as needed for flatulence.     tamsulosin 0.4 MG Caps capsule  Commonly known as:  FLOMAX  Take 0.4 mg by mouth.        Discharge Orders   Future Appointments Provider Department Dept Phone   08/03/2013 12:00 PM Minda Meo, PA-C [facility] [place] [facility] [place]   08/17/2013 11:00 AM Beverley Fiedler, MD [facility] [phone]   12/08/2013 10:30 AM [facility] [phone]   12/08/2013 11:00 AM Chuck Hint, MD [facility] [phone]   Future Orders Complete By Expires   Amb Referral to Cardiac Rehabilitation  As directed    Diet - low sodium heart healthy  As directed    Diet Carb Modified  As directed    Increase activity slowly  As directed      Follow-up Information   Follow up with  EDMISTEN,  Nehemiah Settle, PA-C On 08/03/2013. (at noon. )    Specialty:  Cardiology   Contact information:   [address] [place] [address] [phone]       BRING ALL MEDICATIONS WITH YOU TO FOLLOW UP APPOINTMENTS  Time spent with patient to include physician time: 36 min Signed: Theodore Demark, PA-C 07/20/2013, 2:38 PM Co-Sign MD  Patient seen and examined.  Plan as discussed in my rounding note for today and outlined above. Rollene Rotunda  07/20/2013  7:39 PM

## 2013-07-28 DIAGNOSIS — Z79899 Other long term (current) drug therapy: Secondary | ICD-10-CM | POA: Diagnosis not present

## 2013-07-28 DIAGNOSIS — M67919 Unspecified disorder of synovium and tendon, unspecified shoulder: Secondary | ICD-10-CM | POA: Diagnosis not present

## 2013-07-28 DIAGNOSIS — H16329 Diffuse interstitial keratitis, unspecified eye: Secondary | ICD-10-CM | POA: Diagnosis not present

## 2013-08-02 NOTE — Progress Notes (Signed)
CARDIOLOGY OFFICE NOTE  Patient ID: [name] Cook MRN: [id number], DOB/AGE: [birth year]   Date of Visit: 08/03/2013  Primary Physician: Rudi Heap, MD Primary Cardiologist: Antoine Poche, MD Reason for Visit: Hospital follow-up  History of Present Illness  [name] Cook is a 72 y.o. 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 He underwent cardiac catheterization and PCI to the SVG-OM.   Since discharge, he reports overall he is doing well. His only concern today is increased dizziness with positional changes, specifically with standing and a "knot" at the cath site in the right groin. He states this has been present since his cath but he denies groin pain. He denies chest pain or shortness of breath. He denies palpitations, dizziness, near syncope or syncope. He denies LE swelling, orthopnea, PND or recent weight gain. He is compliant with his medications.  Past Medical History Past Medical History  Diagnosis Date  . CAD (coronary artery disease)   . 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  . Necrotizing fasciitis   . Cogan syndrome   . Hypertension   . Peptic ulcer disease   . Dyslipidemia   . Diabetes mellitus without complication   . Arthritis   . Gallstones   . GERD (gastroesophageal reflux disease)   . Kidney stones   . Colon polyps   . Hepatic hemangioma 2014  . Cholelithiasis 2014    Past Surgical History Past Surgical History  Procedure Laterality Date  . 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 Coronary artery bypass graft  1999    LIMA to the LAD, saphenous vein graft to obtuse marginal 1 and obtuse marginal 2, spahenous vein graft to intermediate, spahenous vein graft to the right coronary artery in 1999  . Appendectomy  2007    Allergies/Intolerances Allergies  Allergen Reactions  . Altace [Ramipril] Other (See Comments)    hypotension     Current Home Medications Current Outpatient Prescriptions  Medication Sig Dispense Refill  . acetaminophen (TYLENOL) 500 MG tablet Take 500 mg by mouth every 6 (six) hours as needed for pain.      Marland Kitchen amLODipine (NORVASC) 5 MG tablet Take 1 tablet (5 mg total) by mouth daily.  90 tablet  3  . aspirin 81 MG tablet Take 81 mg by mouth daily.        Marland Kitchen atorvastatin (LIPITOR) 40 MG tablet Take 1 tablet (40 mg total) by mouth daily.  90 tablet  3  . Calcium Carbonate-Vitamin D (CALCIUM-CARB 600 + D) 600-125 MG-UNIT TABS Take 1 tablet by mouth daily.        . finasteride (PROSCAR) 5 MG tablet Take 5 mg by mouth daily.      . hydrochlorothiazide (HYDRODIURIL) 25 MG tablet Take 1 tablet (25 mg total) by mouth daily.  90 tablet  1  . lisinopril (PRINIVIL,ZESTRIL) 10 MG tablet Take 1 tablet (10 mg total) by mouth 2 (two) times daily.  180 tablet  3  . metFORMIN (GLUCOPHAGE) 500 MG tablet Take 500 mg by mouth daily.      . metoprolol succinate (TOPROL-XL) 50 MG 24 hr tablet Take 1 tablet (50 mg total) by mouth daily.  90 tablet  3  . Multiple Vitamin (MULTIVITAMIN) tablet Take 1 tablet by mouth daily.        . nitroGLYCERIN (NITROSTAT) 0.4 MG SL  tablet Place 1 tablet (0.4 mg total) under the tongue every 5 (five) minutes as needed for chest pain.  25 tablet  3  . NON FORMULARY Take 10-15 mLs by mouth as needed (for sleep). z quill at bedtime      . omeprazole (PRILOSEC) 20 MG capsule Take 20 mg by mouth daily.        . prasugrel (EFFIENT) 10 MG TABS tablet Take 1 tablet (10 mg total) by mouth daily.  90 tablet  3  . Probiotic Product (RESTORA PO) Take by mouth.      . ranitidine (ZANTAC) 75 MG tablet Take 75 mg by mouth 2 (two) times daily.        . tamsulosin (FLOMAX) 0.4 MG CAPS Take 0.4 mg by mouth.       No current facility-administered medications for this visit.    Social History Social History  . Marital Status: Married   Social History Main Topics  . Smoking status: Former Smoker     Types: Cigarettes    Start date: 12/26/1962    Quit date: 11/25/1997  . Smokeless tobacco: Never Used  . Alcohol Use: 8.4 oz/week    14 Shots of liquor per week     Comment: daily   . Drug Use: No   Social History Narrative   Lives in [place] with his wife    Review of Systems General: No chills, fever, night sweats or weight changes Cardiovascular: No chest pain, dyspnea on exertion, edema, orthopnea, palpitations, paroxysmal nocturnal dyspnea Dermatological: No rash, lesions or masses Respiratory: No cough, dyspnea Urologic: No hematuria, dysuria Abdominal: No nausea, vomiting, diarrhea, bright red blood per rectum, melena, or hematemesis Neurologic: No visual changes, weakness, changes in mental status All other systems reviewed and are otherwise negative except as noted above.  Physical Exam Vitals: Blood pressure 148/66, pulse 57, height 5\' 8"  (1.727 m), weight 199 lb 1.9 oz (90.32 kg), SpO2 95.00%.  General: Well developed, well appearing 72 y.o. male in no acute distress. HEENT: Normocephalic, atraumatic. EOMs intact. Sclera nonicteric. Oropharynx clear.  Neck: Supple without bruits. No JVD. Lungs: Respirations regular and unlabored, CTA bilaterally. No wheezes, rales or rhonchi. Heart: RRR. S1, S2 present. No murmurs, rub, S3 or S4. Abdomen: Soft, non-tender, non-distended. BS present x 4 quadrants. No hepatosplenomegaly.  Extremities: No clubbing, cyanosis or edema. PT/Radials 2+ and equal bilaterally. Right groin site intact, incision well healed. 3 in x 3 in firm hematoma noted. Non-tender to palpation. Mild ecchymoses proximal thigh. No warmth, erythema or drainage. Psych: Normal affect. Neuro: Alert and oriented X 3. Moves all extremities spontaneously.   Diagnostics Cardiac catheterization 07/16/2013 Procedural Findings:  Hemodynamics:  AO 156/75  LV 161/11  Coronary angiography:  Coronary dominance: Right  Left mainstem: Distal long 75% stenosis.  Left  anterior descending (LAD): Long proximal/mid 80% stenosis. Mid subtotal stenosis. The distal vessel has nonobstructive disease as the vessel wraps the apex.  Left circumflex (LCx): MOM perfused vis SVG. It is a branching vessel with ostial occlusion and severe proximal stenosis. The graft is sewn to the superior branch and back fills diffusely disease inferior branches and proximal vessel across 99% stenosis.  Right coronary artery (RCA): 100% mid stenosis with bridging and left to right collateral flow.  Grafts:  LIMA to LAD: Widely patent  SVG to RCA: Occluded  SVG to RI: Occluded  SVG to OM1/OM2: This is patent to an OM there are not sequential anastomosis noted. The graft has a proximal focal  99% stenosis.  Left ventriculography: Left ventricular systolic function is mildly reduced, LVEF is estimated at 60% with inferior mild/moderate akinesis, there is no significant mitral regurgitation  Final Conclusions: Severe 3 vessel CAD with 2 of 5 grafts patent. High grade stenosis in the SVG to OM. Preserved EF.  Recommendations: PCI of OM.  PCI note: Lesion Data:  Vessel: SVG to OM  Percent stenosis (pre): 90%  TIMI-flow (pre): 3  Stent: 3.5 x 16 mm Promus premier  Percent stenosis (post): 0%  TIMI-flow (post): 3  Conclusions: Successful stenting of the SVG to the OM with a DES.  Recommendations: Dual antiplatelet therapy for one year.   Echocardiogram 07/17/2013 Study Conclusions - Left ventricle: The cavity size was normal. Wall thickness was increased in a pattern of mild LVH. Systolic function was normal. The estimated ejection fraction was in the range of 60% to 65%. There was an increased relative contribution of atrial contraction to ventricular filling. Doppler parameters are consistent with abnormal left ventricular relaxation (grade 1 diastolic dysfunction). Doppler parameters are consistent with elevated ventricular end-diastolic filling pressure. - Aortic valve: There is  mild to moderate aortic stenosis, with a maximally estimated mean gradient of 19 mmHg. Mildly thickened, mildly calcified leaflets. Mild regurgitation. Valve area: 1.2cm^2(VTI). Valve area: 1.28cm^2 (Vmax).  Assessment and Plan 1. CAD s/p recent NSTEMI and PCI to SVG-OM - stable without anginal symptoms - continue dual antiplatelet therapy for at least one year - preserved LV function - continue medical therapy and risk factor modification 2. Right groin hematoma, now with confirmed pseudoaneurysm - right groin US performed today which revealed pseudoaneurysm - spoke with Dr. Excell Seltzer - Mr. Mcgahee will be scheduled for thrombin injection with Dr. Excell Seltzer on Thursday, 08/05/2013 - activity restrictions reviewed with Mr. Wainwright and wife - no strenuous activity, no lifting, pushing, pulling or reaching  - if develops increasing hematoma size or pain, instructed to apply pressure to groin and call 911; Mr. Cicero and his wife expressed verbal understanding and agree 3. Mild to moderate AS - reviewed echo findings with Mr. Borgerding and his wife in detail - continue to follow clinically  Signed, Rick Duff, PA-C 08/03/2013, 1:40 PM

## 2013-08-03 ENCOUNTER — Encounter (INDEPENDENT_AMBULATORY_CARE_PROVIDER_SITE_OTHER): Payer: Medicare Other

## 2013-08-03 ENCOUNTER — Ambulatory Visit (INDEPENDENT_AMBULATORY_CARE_PROVIDER_SITE_OTHER): Payer: Medicare Other | Admitting: Cardiology

## 2013-08-03 ENCOUNTER — Encounter: Payer: Self-pay | Admitting: Cardiology

## 2013-08-03 VITALS — BP 122/66 | HR 66 | Ht 68.0 in | Wt 199.1 lb

## 2013-08-03 DIAGNOSIS — I2510 Atherosclerotic heart disease of native coronary artery without angina pectoris: Secondary | ICD-10-CM

## 2013-08-03 DIAGNOSIS — R0989 Other specified symptoms and signs involving the circulatory and respiratory systems: Secondary | ICD-10-CM

## 2013-08-03 DIAGNOSIS — I350 Nonrheumatic aortic (valve) stenosis: Secondary | ICD-10-CM

## 2013-08-03 DIAGNOSIS — I214 Non-ST elevation (NSTEMI) myocardial infarction: Secondary | ICD-10-CM | POA: Diagnosis not present

## 2013-08-03 DIAGNOSIS — Z9861 Coronary angioplasty status: Secondary | ICD-10-CM | POA: Diagnosis not present

## 2013-08-03 DIAGNOSIS — E876 Hypokalemia: Secondary | ICD-10-CM

## 2013-08-03 DIAGNOSIS — I359 Nonrheumatic aortic valve disorder, unspecified: Secondary | ICD-10-CM

## 2013-08-03 DIAGNOSIS — Z955 Presence of coronary angioplasty implant and graft: Secondary | ICD-10-CM

## 2013-08-03 DIAGNOSIS — I724 Aneurysm of artery of lower extremity: Secondary | ICD-10-CM

## 2013-08-03 DIAGNOSIS — I10 Essential (primary) hypertension: Secondary | ICD-10-CM

## 2013-08-03 NOTE — Patient Instructions (Signed)
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 Please refer to the Current Medication list given to you today.

## 2013-08-04 ENCOUNTER — Telehealth: Payer: Self-pay | Admitting: Cardiology

## 2013-08-04 NOTE — Telephone Encounter (Signed)
Called to check on [name] Cook after right groin [place] yesterday in office confirmed pseudoaneurysm post cath 2 weeks ago. Please see my office note from yesterday. He reports the hematoma is unchanged, not increasing in size and he has no pain. He is taking it easy and was reminded again not to perform any strenuous or exertional activity. He was instructed to arrive to [facility] Short Stay tomorrow, 08/05/2013, at 8:15 AM for repeat right groin [place] and possible thrombin injection with Dr. Excell Seltzer. He and his wife expressed verbal understanding and agree with plan of care.

## 2013-08-05 ENCOUNTER — Ambulatory Visit (HOSPITAL_COMMUNITY)
Admission: RE | Admit: 2013-08-05 | Discharge: 2013-08-05 | Disposition: A | Discharge status: Home / Self Care | Payer: Medicare Other | Source: Ambulatory Visit | Attending: Cardiovascular Disease | Admitting: Cardiovascular Disease

## 2013-08-05 ENCOUNTER — Other Ambulatory Visit: Payer: Self-pay | Admitting: Nurse Practitioner

## 2013-08-05 ENCOUNTER — Encounter (HOSPITAL_COMMUNITY)
Admission: RE | Disposition: A | Discharge status: Home / Self Care | Payer: Self-pay | Source: Ambulatory Visit | Attending: Cardiovascular Disease

## 2013-08-05 DIAGNOSIS — Z538 Procedure and treatment not carried out for other reasons: Secondary | ICD-10-CM | POA: Insufficient documentation

## 2013-08-05 DIAGNOSIS — T148XXA Other injury of unspecified body region, initial encounter: Secondary | ICD-10-CM

## 2013-08-05 DIAGNOSIS — I724 Aneurysm of artery of lower extremity: Secondary | ICD-10-CM | POA: Insufficient documentation

## 2013-08-05 DIAGNOSIS — Y840 Cardiac catheterization as the cause of abnormal reaction of the patient, or of later complication, without mention of misadventure at the time of the procedure: Secondary | ICD-10-CM | POA: Insufficient documentation

## 2013-08-05 DIAGNOSIS — Z79899 Other long term (current) drug therapy: Secondary | ICD-10-CM | POA: Diagnosis not present

## 2013-08-05 DIAGNOSIS — I999 Unspecified disorder of circulatory system: Secondary | ICD-10-CM | POA: Diagnosis not present

## 2013-08-05 LAB — GLUCOSE, CAPILLARY

## 2013-08-05 SURGERY — TILT TABLE STUDY
Anesthesia: LOCAL

## 2013-08-05 MED ORDER — SODIUM CHLORIDE 0.9 % IV SOLN: INTRAVENOUS | Status: DC

## 2013-08-05 NOTE — Progress Notes (Signed)
*  PRELIMINARY RESULTS* Vascular Ultrasound Limited right Lower Extremity Arterial Duplex has been completed.   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 The right common femoral artery is patent.  Preliminary results discussed with Dr.Cooper.  08/05/2013 10:30 AM Gertie Fey, RVT, RDCS, RDMS

## 2013-08-05 NOTE — Progress Notes (Signed)
Pt presents for thrombin injection to treat RFA pseudoaneurysm. He was examined and the right groin site shows ecchymoses and a palpable mass without a palpable bruit or thrill. Duplex scan shows complete thrombosis of the pseudoaneurysm. Right CFA flow is normal. No thrombin injection required as sac has thrombosed on it's own. Pt advised no heavy lifting or strenuous activity x 1 week. He will call with problems, otherwise to follow-up with Dr Antoine Poche in [place] as scheduled.  Tonny Bollman 08/05/2013 10:33 AM

## 2013-08-06 NOTE — H&P (View-Only) (Signed)
 CARDIOLOGY OFFICE NOTE  Patient ID: [name] Cook MRN: [id number], DOB/AGE: 03/12/1941   Date of Visit: 08/03/2013  Primary Physician: MOORE, DONALD, MD Primary Cardiologist: Hochrein, MD Reason for Visit: Hospital follow-up  History of Present Illness  [name] Cook is a 72 y.o. 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 He underwent cardiac catheterization and PCI to the SVG-OM.   Since discharge, he reports overall he is doing well. His only concern today is increased dizziness with positional changes, specifically with standing and a "knot" at the cath site in the right groin. He states this has been present since his cath but he denies groin pain. He denies chest pain or shortness of breath. He denies palpitations, dizziness, near syncope or syncope. He denies LE swelling, orthopnea, PND or recent weight gain. He is compliant with his medications.  Past Medical History Past Medical History  Diagnosis Date  . CAD (coronary artery disease)   . 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  . Necrotizing fasciitis   . Cogan syndrome   . Hypertension   . Peptic ulcer disease   . Dyslipidemia   . Diabetes mellitus without complication   . Arthritis   . Gallstones   . GERD (gastroesophageal reflux disease)   . Kidney stones   . Colon polyps   . Hepatic hemangioma 2014  . Cholelithiasis 2014    Past Surgical History Past Surgical History  Procedure Laterality Date  . 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 Coronary artery bypass graft  1999    LIMA to the LAD, saphenous vein graft to obtuse marginal 1 and obtuse marginal 2, spahenous vein graft to intermediate, spahenous vein graft to the right coronary artery in 1999  . Appendectomy  2007    Allergies/Intolerances Allergies  Allergen Reactions  . Altace [Ramipril] Other (See Comments)    hypotension     Current Home Medications Current Outpatient Prescriptions  Medication Sig Dispense Refill  . acetaminophen (TYLENOL) 500 MG tablet Take 500 mg by mouth every 6 (six) hours as needed for pain.      . amLODipine (NORVASC) 5 MG tablet Take 1 tablet (5 mg total) by mouth daily.  90 tablet  3  . aspirin 81 MG tablet Take 81 mg by mouth daily.        . atorvastatin (LIPITOR) 40 MG tablet Take 1 tablet (40 mg total) by mouth daily.  90 tablet  3  . Calcium Carbonate-Vitamin D (CALCIUM-CARB 600 + D) 600-125 MG-UNIT TABS Take 1 tablet by mouth daily.        . finasteride (PROSCAR) 5 MG tablet Take 5 mg by mouth daily.      . hydrochlorothiazide (HYDRODIURIL) 25 MG tablet Take 1 tablet (25 mg total) by mouth daily.  90 tablet  1  . lisinopril (PRINIVIL,ZESTRIL) 10 MG tablet Take 1 tablet (10 mg total) by mouth 2 (two) times daily.  180 tablet  3  . metFORMIN (GLUCOPHAGE) 500 MG tablet Take 500 mg by mouth daily.      . metoprolol succinate (TOPROL-XL) 50 MG 24 hr tablet Take 1 tablet (50 mg total) by mouth daily.  90 tablet  3  . Multiple Vitamin (MULTIVITAMIN) tablet Take 1 tablet by mouth daily.        . nitroGLYCERIN (NITROSTAT) 0.4 MG SL   tablet Place 1 tablet (0.4 mg total) under the tongue every 5 (five) minutes as needed for chest pain.  25 tablet  3  . NON FORMULARY Take 10-15 mLs by mouth as needed (for sleep). z quill at bedtime      . omeprazole (PRILOSEC) 20 MG capsule Take 20 mg by mouth daily.        . prasugrel (EFFIENT) 10 MG TABS tablet Take 1 tablet (10 mg total) by mouth daily.  90 tablet  3  . Probiotic Product (RESTORA PO) Take by mouth.      . ranitidine (ZANTAC) 75 MG tablet Take 75 mg by mouth 2 (two) times daily.        . tamsulosin (FLOMAX) 0.4 MG CAPS Take 0.4 mg by mouth.       No current facility-administered medications for this visit.    Social History Social History  . Marital Status: Married   Social History Main Topics  . Smoking status: Former Smoker     Types: Cigarettes    Start date: 12/26/1962    Quit date: 11/25/1997  . Smokeless tobacco: Never Used  . Alcohol Use: 8.4 oz/week    14 Shots of liquor per week     Comment: daily   . Drug Use: No   Social History Narrative   Lives in [place] with his wife    Review of Systems General: No chills, fever, night sweats or weight changes Cardiovascular: No chest pain, dyspnea on exertion, edema, orthopnea, palpitations, paroxysmal nocturnal dyspnea Dermatological: No rash, lesions or masses Respiratory: No cough, dyspnea Urologic: No hematuria, dysuria Abdominal: No nausea, vomiting, diarrhea, bright red blood per rectum, melena, or hematemesis Neurologic: No visual changes, weakness, changes in mental status All other systems reviewed and are otherwise negative except as noted above.  Physical Exam Vitals: Blood pressure 148/66, pulse 57, height 5' 8" (1.727 m), weight 199 lb 1.9 oz (90.32 kg), SpO2 95.00%.  General: Well developed, well appearing 72 y.o. male in no acute distress. HEENT: Normocephalic, atraumatic. EOMs intact. Sclera nonicteric. Oropharynx clear.  Neck: Supple without bruits. No JVD. Lungs: Respirations regular and unlabored, CTA bilaterally. No wheezes, rales or rhonchi. Heart: RRR. S1, S2 present. No murmurs, rub, S3 or S4. Abdomen: Soft, non-tender, non-distended. BS present x 4 quadrants. No hepatosplenomegaly.  Extremities: No clubbing, cyanosis or edema. PT/Radials 2+ and equal bilaterally. Right groin site intact, incision well healed. 3 in x 3 in firm hematoma noted. Non-tender to palpation. Mild ecchymoses proximal thigh. No warmth, erythema or drainage. Psych: Normal affect. Neuro: Alert and oriented X 3. Moves all extremities spontaneously.   Diagnostics Cardiac catheterization 07/16/2013 Procedural Findings:  Hemodynamics:  AO 156/75  LV 161/11  Coronary angiography:  Coronary dominance: Right  Left mainstem: Distal long 75% stenosis.  Left  anterior descending (LAD): Long proximal/mid 80% stenosis. Mid subtotal stenosis. The distal vessel has nonobstructive disease as the vessel wraps the apex.  Left circumflex (LCx): MOM perfused vis SVG. It is a branching vessel with ostial occlusion and severe proximal stenosis. The graft is sewn to the superior branch and back fills diffusely disease inferior branches and proximal vessel across 99% stenosis.  Right coronary artery (RCA): 100% mid stenosis with bridging and left to right collateral flow.  Grafts:  LIMA to LAD: Widely patent  SVG to RCA: Occluded  SVG to RI: Occluded  SVG to OM1/OM2: This is patent to an OM there are not sequential anastomosis noted. The graft has a proximal focal   99% stenosis.  Left ventriculography: Left ventricular systolic function is mildly reduced, LVEF is estimated at 60% with inferior mild/moderate akinesis, there is no significant mitral regurgitation  Final Conclusions: Severe 3 vessel CAD with 2 of 5 grafts patent. High grade stenosis in the SVG to OM. Preserved EF.  Recommendations: PCI of OM.  PCI note: Lesion Data:  Vessel: SVG to OM  Percent stenosis (pre): 90%  TIMI-flow (pre): 3  Stent: 3.5 x 16 mm Promus premier  Percent stenosis (post): 0%  TIMI-flow (post): 3  Conclusions: Successful stenting of the SVG to the OM with a DES.  Recommendations: Dual antiplatelet therapy for one year.   Echocardiogram 07/17/2013 Study Conclusions - Left ventricle: The cavity size was normal. Wall thickness was increased in a pattern of mild LVH. Systolic function was normal. The estimated ejection fraction was in the range of 60% to 65%. There was an increased relative contribution of atrial contraction to ventricular filling. Doppler parameters are consistent with abnormal left ventricular relaxation (grade 1 diastolic dysfunction). Doppler parameters are consistent with elevated ventricular end-diastolic filling pressure. - Aortic valve: There is  mild to moderate aortic stenosis, with a maximally estimated mean gradient of 19 mmHg. Mildly thickened, mildly calcified leaflets. Mild regurgitation. Valve area: 1.2cm^2(VTI). Valve area: 1.28cm^2 (Vmax).  Assessment and Plan 1. CAD s/p recent NSTEMI and PCI to SVG-OM - stable without anginal symptoms - continue dual antiplatelet therapy for at least one year - preserved LV function - continue medical therapy and risk factor modification 2. Right groin hematoma, now with confirmed pseudoaneurysm - right groin US performed today which revealed pseudoaneurysm - spoke with Dr. Cooper - Mr. Ting will be scheduled for thrombin injection with Dr. Cooper on Thursday, 08/05/2013 - activity restrictions reviewed with Mr. Sites and wife - no strenuous activity, no lifting, pushing, pulling or reaching  - if develops increasing hematoma size or pain, instructed to apply pressure to groin and call 911; Mr. Peeples and his wife expressed verbal understanding and agree 3. Mild to moderate AS - reviewed echo findings with Mr. Mcquaig and his wife in detail - continue to follow clinically  Signed, Wisdom Rickey, PA-C 08/03/2013, 1:40 PM      

## 2013-08-06 NOTE — Interval H&P Note (Signed)
History and Physical Interval Note:  08/06/2013 9:03 AM  [name] Cook  has presented today for surgery, with the diagnosis of A  The various methods of treatment have been discussed with the patient and family. After consideration of risks, benefits and other options for treatment, the patient has consented to  Procedure(s): THROMBIN INJECTION (N/A) as a surgical intervention .  The patient's history has been reviewed, patient examined, no change in status, stable for surgery.  I have reviewed the patient's chart and labs.  Questions were answered to the patient's satisfaction.     Tonny Bollman

## 2013-08-11 ENCOUNTER — Other Ambulatory Visit: Payer: Self-pay | Admitting: Physician Assistant

## 2013-08-13 ENCOUNTER — Encounter: Payer: Self-pay | Admitting: Internal Medicine

## 2013-08-17 ENCOUNTER — Ambulatory Visit (INDEPENDENT_AMBULATORY_CARE_PROVIDER_SITE_OTHER): Payer: Medicare Other | Admitting: Internal Medicine

## 2013-08-17 ENCOUNTER — Ambulatory Visit: Payer: Medicare Other | Admitting: Internal Medicine

## 2013-08-17 ENCOUNTER — Encounter: Payer: Self-pay | Admitting: Internal Medicine

## 2013-08-17 VITALS — BP 124/68 | HR 72 | Ht 68.0 in | Wt 201.8 lb

## 2013-08-17 DIAGNOSIS — R195 Other fecal abnormalities: Secondary | ICD-10-CM | POA: Diagnosis not present

## 2013-08-17 DIAGNOSIS — R159 Full incontinence of feces: Secondary | ICD-10-CM | POA: Diagnosis not present

## 2013-08-17 DIAGNOSIS — R141 Gas pain: Secondary | ICD-10-CM

## 2013-08-17 DIAGNOSIS — K649 Unspecified hemorrhoids: Secondary | ICD-10-CM

## 2013-08-17 DIAGNOSIS — Z86010 Personal history of colonic polyps: Secondary | ICD-10-CM

## 2013-08-17 DIAGNOSIS — R140 Abdominal distension (gaseous): Secondary | ICD-10-CM

## 2013-08-17 MED ORDER — OMEPRAZOLE 20 MG PO CPDR: 20.0000 mg | DELAYED_RELEASE_CAPSULE | Freq: Every day | ORAL | Status: DC

## 2013-08-17 MED ORDER — RESTORA PO CAPS: 1.0000 | ORAL_CAPSULE | Freq: Every day | ORAL | Status: DC

## 2013-08-17 NOTE — Progress Notes (Signed)
  Subjective:    Patient ID: [name] Cook, male    DOB: [birth year], 72 y.o.   MRN: [id number]  HPI [name] Cook is a 72 year old male with a past medical history of CAD, PVD, hypertension, diabetes, GERD, kidney stones, and adenomatous colon polyps who is seen in followup. He is here today with his wife. Since last being seen here he did have a myocardial infarction requiring PCI with drug-eluting stent. He has subsequently been started on Effient.  His blood pressure medications were also adjusted with the addition of lisinopril and hydrochlorothiazide. From a GI perspective he reports he is doing very well. He is having no further issues with fecal incontinence, loose stools or hemorrhoids. Since last being seen here he started Benefiber twice daily which has normalized his stool consistency. He is having one normal bowel movement daily without blood or melena. He stopped using Anusol cream but is occasionally using witch hazel wipes. No further fecal incontinence. Also of note he did not start Levsin/anticholinergic as suggested at the last visit due to improvement with fiber alone. He has continued with the Restora with resolution of abdominal bloating. He plans to continue this on a daily basis. He followed up with Dr. Vernie Ammons, his urologist, and was subtotally started on tamsulosin do to mild urinary retention. He's had no further issues with pickups. Appetite is good without nausea or vomiting. Hasn't had heartburn, dysphagia or odynophagia.  Review of Systems As per history of present illness, otherwise negative  Current Medications, Allergies, Past Medical History, Past Surgical History, Family History and Social History were reviewed in [employer] record.     Objective:   Physical Exam BP 124/68  Pulse 72  Ht 5\' 8"  (1.727 m)  Wt 201 lb 12.8 oz (91.536 kg)  BMI 30.69 kg/m2 Constitutional: Well-developed and well-nourished. No distress. HEENT: Normocephalic and  atraumatic. No scleral icterus. Cardiovascular: Normal rate, regular rhythm and intact distal pulses. 2/6 SEM, RUSB Pulmonary/chest: Effort normal and breath sounds normal. No wheezing, rales or rhonchi. Abdominal: Soft, nontender, nondistended. Bowel sounds active throughout. There are no masses palpable. No hepatosplenomegaly. Extremities: no clubbing, cyanosis, or edema, right BKA Neurological: Alert and oriented to person place and time. Skin: Skin is warm and dry. No rashes noted. Psychiatric: Normal mood and affect. Behavior is normal.      Assessment & Plan:   72 year old male with a past medical history of CAD, PVD, hypertension, diabetes, GERD, kidney stones, and adenomatous colon polyps who is seen in followup  1.  Fecal incontinence/loose stools -- resolved with the addition of daily fiber. He will continue Benefiber once to twice daily.  2.  Hemorrhoids -- not an active issue at present. He will continue to use witch hazel wipes as necessary. He is no longer using daily Anusol, which I previously recommended, as to avoid perianal atrophy.  I've asked that he notify me should he have recurrent hemorrhoidal issues in the future. He voices understanding  3.  Abdominal bloating -- improved with probiotic, he will continue Restora daily.  4.  Hiccups -- resolved without recurrence  5.  Hx of adenomatous colon polyps -- he is due repeat surveillance colonoscopy in May 2017. He is aware of this recommendation and recall as been placed  He will followup on an as needed basis and contact me should he have problems, concerns or questions.

## 2013-08-17 NOTE — Patient Instructions (Signed)
Continue taking medications as prescribed. Follow up with Dr. Rhea Belton as needed                                                We are excited to introduce MyChart, a new best-in-class service that provides you online access to important information in your electronic medical record. We want to make it easier for you to view your health information - all in one secure location - when and where you need it. We expect MyChart will enhance the quality of care and service we provide.  When you register for MyChart, you can:    View your test results.    Request appointments and receive appointment reminders via email.    Request medication renewals.    View your medical history, allergies, medications and immunizations.    Communicate with your physician's office through a password-protected site.    Conveniently print information such as your medication lists.  To find out if MyChart is right for you, please talk to a member of our clinical staff today. We will gladly answer your questions about this free health and wellness tool.  If you are age 41 or older and want a member of your family to have access to your record, you must provide written consent by completing a proxy form available at our office. Please speak to our clinical staff about guidelines regarding accounts for patients younger than age 8.  As you activate your MyChart account and need any technical assistance, please call the MyChart technical support line at [phone] [phone]) or email your question to [email or web address]. If you email your question(s), please include your name, a return phone number and the best time to reach you.  If you have non-urgent health-related questions, you can send a message to our office through MyChart at [email or web address]. If you have a medical emergency, call 911.  Thank you for using MyChart as your new health and wellness resource!   MyChart licensed from [employer],  4540-9811. Patents Pending.

## 2013-08-25 DIAGNOSIS — M67919 Unspecified disorder of synovium and tendon, unspecified shoulder: Secondary | ICD-10-CM | POA: Diagnosis not present

## 2013-08-25 DIAGNOSIS — H16329 Diffuse interstitial keratitis, unspecified eye: Secondary | ICD-10-CM | POA: Diagnosis not present

## 2013-09-30 ENCOUNTER — Other Ambulatory Visit: Payer: Self-pay

## 2013-10-06 DIAGNOSIS — H16329 Diffuse interstitial keratitis, unspecified eye: Secondary | ICD-10-CM | POA: Diagnosis not present

## 2013-10-06 DIAGNOSIS — M545 Low back pain: Secondary | ICD-10-CM | POA: Diagnosis not present

## 2013-10-06 DIAGNOSIS — M67919 Unspecified disorder of synovium and tendon, unspecified shoulder: Secondary | ICD-10-CM | POA: Diagnosis not present

## 2013-10-15 ENCOUNTER — Other Ambulatory Visit (HOSPITAL_COMMUNITY): Payer: Self-pay | Admitting: Physician Assistant

## 2013-11-02 ENCOUNTER — Other Ambulatory Visit (HOSPITAL_COMMUNITY): Payer: Self-pay | Admitting: Physician Assistant

## 2013-11-04 ENCOUNTER — Other Ambulatory Visit (HOSPITAL_COMMUNITY): Payer: Self-pay | Admitting: Physician Assistant

## 2013-11-10 ENCOUNTER — Other Ambulatory Visit: Payer: Self-pay

## 2013-11-10 MED ORDER — METFORMIN HCL 500 MG PO TABS: 500.0000 mg | ORAL_TABLET | Freq: Every day | ORAL | Status: DC

## 2013-12-07 ENCOUNTER — Encounter: Payer: Self-pay | Admitting: Vascular Surgery

## 2013-12-08 ENCOUNTER — Ambulatory Visit (INDEPENDENT_AMBULATORY_CARE_PROVIDER_SITE_OTHER): Payer: Medicare Other | Admitting: Vascular Surgery

## 2013-12-08 ENCOUNTER — Ambulatory Visit (HOSPITAL_COMMUNITY)
Admission: RE | Admit: 2013-12-08 | Discharge: 2013-12-08 | Disposition: A | Discharge status: Home / Self Care | Payer: Medicare Other | Source: Ambulatory Visit | Attending: Vascular Surgery | Admitting: Vascular Surgery

## 2013-12-08 ENCOUNTER — Other Ambulatory Visit: Payer: Self-pay | Admitting: Family Medicine

## 2013-12-08 ENCOUNTER — Encounter: Payer: Self-pay | Admitting: Vascular Surgery

## 2013-12-08 VITALS — BP 170/70 | HR 65 | Ht 68.0 in | Wt 202.0 lb

## 2013-12-08 DIAGNOSIS — I739 Peripheral vascular disease, unspecified: Secondary | ICD-10-CM | POA: Insufficient documentation

## 2013-12-08 DIAGNOSIS — I70219 Atherosclerosis of native arteries of extremities with intermittent claudication, unspecified extremity: Secondary | ICD-10-CM

## 2013-12-08 DIAGNOSIS — S88119A Complete traumatic amputation at level between knee and ankle, unspecified lower leg, initial encounter: Secondary | ICD-10-CM | POA: Insufficient documentation

## 2013-12-08 DIAGNOSIS — I10 Essential (primary) hypertension: Secondary | ICD-10-CM | POA: Diagnosis not present

## 2013-12-08 DIAGNOSIS — E119 Type 2 diabetes mellitus without complications: Secondary | ICD-10-CM | POA: Diagnosis not present

## 2013-12-08 DIAGNOSIS — E785 Hyperlipidemia, unspecified: Secondary | ICD-10-CM | POA: Diagnosis not present

## 2013-12-08 NOTE — Addendum Note (Signed)
Addended by: Mena Goes on: 12/08/2013 04:21 PM   Modules accepted: Orders

## 2013-12-08 NOTE — Assessment & Plan Note (Signed)
The patient has normal arterial flow in the left lower extremity at rest. He does sound like he has some mild buttock claudication bilaterally in may have some mild iliac disease. However these symptoms are currently quite stable. If his symptoms progress in the future he could be considered for arteriography and possible iliac angioplasty. I've encouraged him to stay as active as possible. I've ordered a follow up study in 18 months and I'll see him back at that time. He knows to call sooner if he has problems.

## 2013-12-08 NOTE — Progress Notes (Signed)
[facility]  Patient name: [name] Cook MRN: [id number] DOB: [birth year] Sex: male  REASON FOR VISIT: follow up of peripheral vascular disease.  HPI: [name] Cook is a 73 y.o. male who I last saw in April 2014. He is status post right below the knee amputation secondary to necrotizing fasciitis that he developed from vein harvest site after open-heart surgery in 1999. He is ambulatory with her prosthesis. When I saw him previously he had some mild buttock claudication bilaterally. He comes in for a 9 month follow up visit.  Since I saw him last, he did have an MI and placement of a stent in August of 2014. He has done well since that time. He denies any significant calf claudication, but does have some hip claudication which has been stable. He denies rest pain or nonhealing ulcers.  Past Medical History  Diagnosis Date  . CAD (coronary artery disease)   . 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  . Necrotizing fasciitis   . Cogan syndrome   . Hypertension   . Peptic ulcer disease   . Dyslipidemia   . Diabetes mellitus without complication   . Arthritis   . Gallstones   . GERD (gastroesophageal reflux disease)   . Kidney stones   . Colon polyps   . Hepatic hemangioma 2014  . Cholelithiasis 2014  . Myocardial infarction    Family History  Problem Relation Age of Onset  . Colon cancer Neg Hx   . Hyperlipidemia Mother   . Hypertension Mother   . Other Mother     varicose veins  . Diabetes Sister   . Heart disease Mother   . Heart disease Maternal Grandfather   . Rheum arthritis Father   . Heart attack Father    SOCIAL HISTORY: History  Substance Use Topics  . Smoking status: Former Smoker    Types: Cigarettes    Start date: 12/26/1962    Quit date: 11/25/1997  . Smokeless tobacco: Never Used  . Alcohol Use: 8.4 oz/week    14 Shots of liquor per week     Comment: daily    Allergies  Allergen Reactions  . Altace  [Ramipril] Other (See Comments)    hypotension   Current Outpatient Prescriptions  Medication Sig Dispense Refill  . acetaminophen (TYLENOL) 500 MG tablet Take 500 mg by mouth every 6 (six) hours as needed for pain.      Marland Kitchen amLODipine (NORVASC) 5 MG tablet Take 1 tablet (5 mg total) by mouth daily.  90 tablet  3  . aspirin 81 MG tablet Take 81 mg by mouth daily.        Marland Kitchen atorvastatin (LIPITOR) 40 MG tablet Take 1 tablet (40 mg total) by mouth daily.  90 tablet  3  . Calcium Carbonate-Vitamin D (CALCIUM-CARB 600 + D) 600-125 MG-UNIT TABS Take 1 tablet by mouth daily.        Marland Kitchen EFFIENT 10 MG TABS tablet TAKE ONE TABLET BY MOUTH ONE TIME DAILY  90 each  3  . finasteride (PROSCAR) 5 MG tablet Take 5 mg by mouth daily.      . hydrochlorothiazide (HYDRODIURIL) 25 MG tablet Take 1 tablet (25 mg total) by mouth daily.  90 tablet  1  . lisinopril (PRINIVIL,ZESTRIL) 10 MG tablet Take 1 tablet (10 mg total) by mouth 2 (two) times daily.  180 tablet  3  . metFORMIN (GLUCOPHAGE) 500 MG tablet Take 1 tablet (  500 mg total) by mouth daily.  30 tablet  0  . metoprolol succinate (TOPROL-XL) 50 MG 24 hr tablet Take 1 tablet (50 mg total) by mouth daily.  90 tablet  3  . Multiple Vitamin (MULTIVITAMIN) tablet Take 1 tablet by mouth daily.        . nitroGLYCERIN (NITROSTAT) 0.4 MG SL tablet Place 1 tablet (0.4 mg total) under the tongue every 5 (five) minutes as needed for chest pain.  25 tablet  3  . NON FORMULARY Take 10-15 mLs by mouth as needed (for sleep). z quill at bedtime      . omeprazole (PRILOSEC) 20 MG capsule Take 1 capsule (20 mg total) by mouth daily.  30 capsule  6  . Probiotic Product (RESTORA) CAPS Take 1 capsule by mouth daily.  30 capsule  6  . ranitidine (ZANTAC) 75 MG tablet Take 75 mg by mouth 2 (two) times daily.        . tamsulosin (FLOMAX) 0.4 MG CAPS Take 0.4 mg by mouth.      . Wheat Dextrin (BENEFIBER) POWD Take by mouth daily.       No current facility-administered medications for  this visit.   REVIEW OF SYSTEMS: Valu.Nieves ] denotes positive finding; [  ] denotes negative finding  CARDIOVASCULAR:  [ ]  chest pain   [ ]  chest pressure   [ ]  palpitations   [ ]  orthopnea   Valu.Nieves ] dyspnea on exertion   [X]  claudication   [ ]  rest pain   [ ]  DVT   [ ]  phlebitis PULMONARY:   [ ]  productive cough   [ ]  asthma   [ ]  wheezing NEUROLOGIC:   [ ]  weakness  [ ]  paresthesias  [ ]  aphasia  [ ]  amaurosis  Valu.Nieves ] dizziness HEMATOLOGIC:   [ ]  bleeding problems   [ ]  clotting disorders MUSCULOSKELETAL:  [ ]  joint pain   [ ]  joint swelling [ ]  leg swelling GASTROINTESTINAL: [ ]   blood in stool  [ ]   hematemesis GENITOURINARY:  [ ]   dysuria  [ ]   hematuria PSYCHIATRIC:  [ ]  history of major depression INTEGUMENTARY:  [ ]  rashes  [ ]  ulcers CONSTITUTIONAL:  [ ]  fever   [ ]  chills  PHYSICAL EXAM: Filed Vitals:   12/08/13 1111  BP: 170/70  Pulse: 65  Height: 5\' 8"  (1.727 m)  Weight: 202 lb (91.627 kg)  SpO2: 98%   Body mass index is 30.72 kg/(m^2). GENERAL: The patient is a well-nourished male, in no acute distress. The vital signs are documented above. CARDIOVASCULAR: There is a regular rate and rhythm. I do not detect carotid bruits. He has palpable femoral pulses bilaterally. He has a palpable left dorsalis pedis pulse. PULMONARY: There is good air exchange bilaterally without wheezing or rales. ABDOMEN: Soft and non-tender with normal pitched bowel sounds.  MUSCULOSKELETAL: He has a right below-the-knee amputation. NEUROLOGIC: No focal weakness or paresthesias are detected. SKIN: There are no ulcers or rashes noted. PSYCHIATRIC: The patient has a normal affect.  DATA:  I have independently interpreted his arterial Doppler study which shows triphasic Doppler signals in the left posterior tibial and dorsalis pedis positions. ABI on the left is 100%.  MEDICAL ISSUES:  Atherosclerosis of native arteries of the extremities with intermittent claudication The patient has normal arterial flow  in the left lower extremity at rest. He does sound like he has some mild buttock claudication bilaterally in may have some mild iliac disease.  However these symptoms are currently quite stable. If his symptoms progress in the future he could be considered for arteriography and possible iliac angioplasty. I've encouraged him to stay as active as possible. I've ordered a follow up study in 18 months and I'll see him back at that time. He knows to call sooner if he has problems.   [facility] [place] Beeper: [phone]

## 2014-01-11 ENCOUNTER — Other Ambulatory Visit: Payer: Self-pay | Admitting: Cardiology

## 2014-01-18 ENCOUNTER — Other Ambulatory Visit: Payer: Self-pay | Admitting: Family Medicine

## 2014-01-19 ENCOUNTER — Other Ambulatory Visit (HOSPITAL_COMMUNITY): Payer: Self-pay | Admitting: Cardiology

## 2014-01-19 ENCOUNTER — Other Ambulatory Visit: Payer: Self-pay

## 2014-01-19 DIAGNOSIS — I6529 Occlusion and stenosis of unspecified carotid artery: Secondary | ICD-10-CM

## 2014-01-19 MED ORDER — PRASUGREL HCL 10 MG PO TABS: ORAL_TABLET | ORAL | Status: DC

## 2014-01-24 ENCOUNTER — Other Ambulatory Visit: Payer: Self-pay

## 2014-01-24 MED ORDER — AMLODIPINE BESYLATE 5 MG PO TABS: 5.0000 mg | ORAL_TABLET | Freq: Every day | ORAL | Status: DC

## 2014-01-27 ENCOUNTER — Ambulatory Visit (HOSPITAL_COMMUNITY): Payer: Medicare Other | Attending: Cardiovascular Disease | Admitting: Cardiology

## 2014-01-27 ENCOUNTER — Telehealth: Payer: Self-pay | Admitting: *Deleted

## 2014-01-27 ENCOUNTER — Encounter: Payer: Self-pay | Admitting: Cardiovascular Disease

## 2014-01-27 DIAGNOSIS — I6529 Occlusion and stenosis of unspecified carotid artery: Secondary | ICD-10-CM | POA: Insufficient documentation

## 2014-01-27 NOTE — Telephone Encounter (Signed)
I was called by the vascular tech (patient here for a carotid ultrasound) Because the patient was complaining of chest pain for 1 week. His BP was 180/90 and he states that it is always high when he comes in for a visit. He is in no acute distress, skin warm and dry with normal resp. Rate of 18. No complaints of palpitations. He describes his chest pain at the upper portion of his sternum at the old CABG incision as constant discomfort for about 1 week. He has no radiation of pain and denies SOB. When I pressed his chest where he states the discomfort is, he states the pain is the same. No increased pain with deep inspiration. Discussed above with Dr.Klein (DOD) and advised OK to see Dr.Hochrein in the office tomorrow. Patient was instructed to report to the ER if pain intensifies or changes. He verbalized understanding.

## 2014-01-27 NOTE — Progress Notes (Signed)
Carotid Duplex Complete 

## 2014-01-28 ENCOUNTER — Ambulatory Visit (INDEPENDENT_AMBULATORY_CARE_PROVIDER_SITE_OTHER): Payer: Medicare Other | Admitting: Cardiology

## 2014-01-28 ENCOUNTER — Encounter: Payer: Self-pay | Admitting: Cardiology

## 2014-01-28 VITALS — BP 179/69 | HR 69 | Ht 68.0 in | Wt 202.0 lb

## 2014-01-28 DIAGNOSIS — I2510 Atherosclerotic heart disease of native coronary artery without angina pectoris: Secondary | ICD-10-CM

## 2014-01-28 DIAGNOSIS — I739 Peripheral vascular disease, unspecified: Secondary | ICD-10-CM

## 2014-01-28 MED ORDER — AMLODIPINE BESYLATE 2.5 MG PO TABS: 2.5000 mg | ORAL_TABLET | Freq: Every day | ORAL | Status: DC

## 2014-01-28 NOTE — Patient Instructions (Signed)
Please increase Amlodipine to 7.5 mg daily. (take 1 and 1/2 tablet daily) A new Rx for 2.5 mg tablets has been sent into [facility].  Once this come in you will take a 5 mg and a 2.5 mg tablet daily. Continue all other medications as listed.  Follow up in 6 months with Dr Percival Spanish.  You will receive a letter in the mail 2 months before you are due.  Please call us when you receive this letter to schedule your follow up appointment.

## 2014-01-28 NOTE — Progress Notes (Signed)
HPI The patient presents for followup of known coronary disease and chest pain.   He was getting a Doppler of his neck yesterday. His blood pressure was elevated. He mentioned some chest pain and was added to my schedule. However, this chest pain is atypical. It feels like something stabbing his chest where he thinks a "wire might be". In fact there is a reproducible point tenderness when pressing on his mid to upper sternum. However, there is no erythema. There is no obvious wire. He's not having any substernal chest pressure, neck or arm discomfort. He has had any blood pressures in the 170 range. He's had no new shortness of breath, PND or orthopnea.  Allergies  Allergen Reactions  . Altace [Ramipril] Other (See Comments)    hypotension    Current Outpatient Prescriptions  Medication Sig Dispense Refill  . acetaminophen (TYLENOL) 500 MG tablet Take 500 mg by mouth every 6 (six) hours as needed for pain.      Marland Kitchen amLODipine (NORVASC) 5 MG tablet Take 1 tablet (5 mg total) by mouth daily.  90 tablet  1  . aspirin 81 MG tablet Take 81 mg by mouth daily.        Marland Kitchen atorvastatin (LIPITOR) 40 MG tablet Take 1 tablet by mouth  daily  30 tablet  6  . Calcium Carbonate-Vitamin D (CALCIUM-CARB 600 + D) 600-125 MG-UNIT TABS Take 1 tablet by mouth daily.        . finasteride (PROSCAR) 5 MG tablet Take 5 mg by mouth daily.      . hydrochlorothiazide (HYDRODIURIL) 25 MG tablet Take 1 tablet by mouth  daily  30 tablet  6  . lisinopril (PRINIVIL,ZESTRIL) 10 MG tablet Take 1 tablet (10 mg total) by mouth 2 (two) times daily.  180 tablet  3  . metFORMIN (GLUCOPHAGE) 500 MG tablet TAKE ONE TABLET BY MOUTH ONE TIME DAILY  30 tablet  1  . metFORMIN (GLUCOPHAGE) 500 MG tablet Take 1 tablet by mouth  daily.  90 tablet  0  . metoprolol succinate (TOPROL-XL) 50 MG 24 hr tablet Take 1 tablet (50 mg total) by mouth daily.  90 tablet  3  . Multiple Vitamin (MULTIVITAMIN) tablet Take 1 tablet by mouth daily.        .  nitroGLYCERIN (NITROSTAT) 0.4 MG SL tablet Place 1 tablet (0.4 mg total) under the tongue every 5 (five) minutes as needed for chest pain.  25 tablet  3  . NON FORMULARY Take 10-15 mLs by mouth as needed (for sleep). z quill at bedtime      . omeprazole (PRILOSEC) 20 MG capsule Take 1 capsule (20 mg total) by mouth daily.  30 capsule  6  . prasugrel (EFFIENT) 10 MG TABS tablet TAKE ONE TABLET BY MOUTH ONE TIME DAILY  90 each  1  . Probiotic Product (RESTORA) CAPS Take 1 capsule by mouth daily.  30 capsule  6  . ranitidine (ZANTAC) 75 MG tablet Take 75 mg by mouth at bedtime.       . tamsulosin (FLOMAX) 0.4 MG CAPS Take 0.8 mg by mouth.       . Wheat Dextrin (BENEFIBER) POWD Take by mouth daily.       No current facility-administered medications for this visit.    Past Medical History  Diagnosis Date  . CAD (coronary artery disease)   . 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  . Necrotizing fasciitis   .  Cogan syndrome   . Hypertension   . Peptic ulcer disease   . Dyslipidemia   . Diabetes mellitus without complication   . Arthritis   . Gallstones   . GERD (gastroesophageal reflux disease)   . Kidney stones   . Colon polyps   . Hepatic hemangioma 2014  . Cholelithiasis 2014  . Myocardial infarction     Past Surgical History  Procedure Laterality Date  . 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 Coronary artery bypass graft  1999    LIMA to the LAD, saphenous vein graft to obtuse marginal 1 and obtuse marginal 2, spahenous vein graft to intermediate, spahenous vein graft to the right coronary artery in 1999  . Appendectomy  2007    ROS:  As stated in the HPI and negative for all other systems.  PHYSICAL EXAM BP 179/69  Pulse 69  Ht 5\' 8"  (1.727 m)  Wt 202 lb (91.627 kg)  BMI 30.72 kg/m2 GENERAL:  Well appearing NECK:  No jugular venous distention, waveform within normal limits, carotid upstroke brisk and symmetric, soft bilateral carotid bruits bruits, no  thyromegaly LYMPHATICS:  No cervical, inguinal adenopathy LUNGS:  Clear to auscultation bilaterally BACK:  No CVA tenderness CHEST:  Well healed sternotomy scar, , some tenderness to palpation reproducing the above symptoms are. HEART:  PMI not displaced or sustained,S1 and S2 within normal limits, no S3, no S4, no clicks, no rubs, soft early peaking apical systolic murmur heard also at the right upper sternal border murmurs ABD:  Flat, positive bowel sounds normal in frequency in pitch, no bruits, no rebound, no guarding, no midline pulsatile mass, no hepatomegaly, no splenomegaly EXT:  2 plus pulses throughout, mild left leg edema, no cyanosis no clubbing, status post amputation right leg   EKG:  Sinus rhythm, rate 65, borderline interventricular conduction delay, left axis deviation, poor anterior R wave progression, old anteroseptal MI.  ASSESSMENT AND PLAN  CAD His symptoms are atypical for angina. It is not like the symptoms he had at the time of his last catheterization in August. His musculoskeletal and could be related to wire but we would manage this conservatively.  HTN I will increase his Norvasc to 5 mg.    Carotid stenosis He has left 40-69% bilateral stenosis. We will follow this again in one year.

## 2014-02-02 DIAGNOSIS — M545 Low back pain, unspecified: Secondary | ICD-10-CM | POA: Diagnosis not present

## 2014-02-02 DIAGNOSIS — I10 Essential (primary) hypertension: Secondary | ICD-10-CM | POA: Diagnosis not present

## 2014-02-02 DIAGNOSIS — M67919 Unspecified disorder of synovium and tendon, unspecified shoulder: Secondary | ICD-10-CM | POA: Diagnosis not present

## 2014-02-05 ENCOUNTER — Other Ambulatory Visit: Payer: Self-pay | Admitting: Family Medicine

## 2014-02-09 NOTE — Telephone Encounter (Signed)
Last ov 6/14 and last A1C 2/14. ntbs

## 2014-03-29 ENCOUNTER — Other Ambulatory Visit: Payer: Self-pay | Admitting: *Deleted

## 2014-03-29 NOTE — Telephone Encounter (Signed)
Metformin refill refused and sent to Dr Redge Gainer whom filled last time

## 2014-03-31 ENCOUNTER — Other Ambulatory Visit: Payer: Self-pay | Admitting: *Deleted

## 2014-04-01 ENCOUNTER — Other Ambulatory Visit: Payer: Self-pay

## 2014-04-12 ENCOUNTER — Other Ambulatory Visit: Payer: Self-pay | Admitting: Family Medicine

## 2014-04-12 ENCOUNTER — Other Ambulatory Visit: Payer: Self-pay | Admitting: Cardiology

## 2014-04-14 NOTE — Telephone Encounter (Signed)
Please schedule patient and appointment to be seen

## 2014-04-14 NOTE — Telephone Encounter (Signed)
Last OV 04-28-13. Please advise on refill

## 2014-04-21 DIAGNOSIS — N401 Enlarged prostate with lower urinary tract symptoms: Secondary | ICD-10-CM | POA: Diagnosis not present

## 2014-04-21 DIAGNOSIS — N139 Obstructive and reflux uropathy, unspecified: Secondary | ICD-10-CM | POA: Diagnosis not present

## 2014-04-21 DIAGNOSIS — R339 Retention of urine, unspecified: Secondary | ICD-10-CM | POA: Diagnosis not present

## 2014-04-21 DIAGNOSIS — N323 Diverticulum of bladder: Secondary | ICD-10-CM | POA: Diagnosis not present

## 2014-05-05 DIAGNOSIS — R339 Retention of urine, unspecified: Secondary | ICD-10-CM | POA: Diagnosis not present

## 2014-05-16 DIAGNOSIS — N139 Obstructive and reflux uropathy, unspecified: Secondary | ICD-10-CM | POA: Diagnosis not present

## 2014-05-16 DIAGNOSIS — N401 Enlarged prostate with lower urinary tract symptoms: Secondary | ICD-10-CM | POA: Diagnosis not present

## 2014-05-16 DIAGNOSIS — N138 Other obstructive and reflux uropathy: Secondary | ICD-10-CM | POA: Diagnosis not present

## 2014-05-16 DIAGNOSIS — R339 Retention of urine, unspecified: Secondary | ICD-10-CM | POA: Diagnosis not present

## 2014-05-26 ENCOUNTER — Other Ambulatory Visit: Payer: Self-pay | Admitting: Cardiology

## 2014-05-30 ENCOUNTER — Other Ambulatory Visit: Payer: Self-pay | Admitting: *Deleted

## 2014-05-30 MED ORDER — LISINOPRIL 10 MG PO TABS: 10.0000 mg | ORAL_TABLET | Freq: Two times a day (BID) | ORAL | Status: DC

## 2014-06-07 DIAGNOSIS — N390 Urinary tract infection, site not specified: Secondary | ICD-10-CM | POA: Diagnosis not present

## 2014-06-07 DIAGNOSIS — R339 Retention of urine, unspecified: Secondary | ICD-10-CM | POA: Diagnosis not present

## 2014-06-23 DIAGNOSIS — N138 Other obstructive and reflux uropathy: Secondary | ICD-10-CM | POA: Diagnosis not present

## 2014-06-23 DIAGNOSIS — N401 Enlarged prostate with lower urinary tract symptoms: Secondary | ICD-10-CM | POA: Diagnosis not present

## 2014-09-05 ENCOUNTER — Ambulatory Visit (INDEPENDENT_AMBULATORY_CARE_PROVIDER_SITE_OTHER): Payer: Medicare Other | Admitting: Cardiology

## 2014-09-05 ENCOUNTER — Other Ambulatory Visit: Payer: Self-pay | Admitting: *Deleted

## 2014-09-05 ENCOUNTER — Encounter: Payer: Self-pay | Admitting: Cardiology

## 2014-09-05 VITALS — BP 160/82 | HR 60 | Ht 69.0 in | Wt 199.0 lb

## 2014-09-05 DIAGNOSIS — R5383 Other fatigue: Secondary | ICD-10-CM

## 2014-09-05 DIAGNOSIS — E119 Type 2 diabetes mellitus without complications: Secondary | ICD-10-CM | POA: Diagnosis not present

## 2014-09-05 DIAGNOSIS — I2510 Atherosclerotic heart disease of native coronary artery without angina pectoris: Secondary | ICD-10-CM | POA: Diagnosis not present

## 2014-09-05 DIAGNOSIS — E785 Hyperlipidemia, unspecified: Secondary | ICD-10-CM

## 2014-09-05 DIAGNOSIS — I10 Essential (primary) hypertension: Secondary | ICD-10-CM

## 2014-09-05 MED ORDER — HYDROCHLOROTHIAZIDE 25 MG PO TABS: 25.0000 mg | ORAL_TABLET | Freq: Every day | ORAL | Status: DC

## 2014-09-05 MED ORDER — ATORVASTATIN CALCIUM 40 MG PO TABS: 40.0000 mg | ORAL_TABLET | Freq: Every day | ORAL | Status: DC

## 2014-09-05 MED ORDER — METOPROLOL SUCCINATE ER 50 MG PO TB24: 50.0000 mg | ORAL_TABLET | Freq: Every day | ORAL | Status: DC

## 2014-09-05 NOTE — Progress Notes (Signed)
HPI The patient presents for followup of known coronary disease.  Since I last saw him he has done well.  The patient denies any new symptoms such as chest discomfort, neck or arm discomfort. There has been no new shortness of breath, PND or orthopnea. There have been no reported palpitations, presyncope or syncope.     Allergies  Allergen Reactions  . Altace [Ramipril] Other (See Comments)    hypotension    Current Outpatient Prescriptions  Medication Sig Dispense Refill  . acetaminophen (TYLENOL) 500 MG tablet Take 500 mg by mouth every 6 (six) hours as needed for pain.      Marland Kitchen amLODipine (NORVASC) 2.5 MG tablet Take 1 tablet (2.5 mg total) by mouth daily.  180 tablet  3  . amLODipine (NORVASC) 5 MG tablet Take 1 tablet (5 mg total) by mouth daily.  90 tablet  1  . aspirin 81 MG tablet Take 81 mg by mouth daily.        . Calcium Carbonate-Vitamin D (CALCIUM-CARB 600 + D) 600-125 MG-UNIT TABS Take 1 tablet by mouth daily.        . finasteride (PROSCAR) 5 MG tablet Take 5 mg by mouth daily.      Marland Kitchen lisinopril (PRINIVIL,ZESTRIL) 10 MG tablet Take 1 tablet (10 mg total) by mouth 2 (two) times daily.  180 tablet  0  . metFORMIN (GLUCOPHAGE) 500 MG tablet Take 1 tablet by mouth  daily  30 tablet  2  . Multiple Vitamin (MULTIVITAMIN) tablet Take 1 tablet by mouth daily.        . nitroGLYCERIN (NITROSTAT) 0.4 MG SL tablet Place 1 tablet (0.4 mg total) under the tongue every 5 (five) minutes as needed for chest pain.  25 tablet  3  . NON FORMULARY Take 10-15 mLs by mouth as needed (for sleep). z quill at bedtime      . omeprazole (PRILOSEC) 20 MG capsule Take 1 capsule (20 mg total) by mouth daily.  30 capsule  6  . Probiotic Product (RESTORA) CAPS Take 1 capsule by mouth daily.  30 capsule  6  . ranitidine (ZANTAC) 75 MG tablet Take 75 mg by mouth at bedtime.       . tamsulosin (FLOMAX) 0.4 MG CAPS Take 0.8 mg by mouth.       . Wheat Dextrin (BENEFIBER) POWD Take by mouth daily.      Marland Kitchen  atorvastatin (LIPITOR) 40 MG tablet Take 1 tablet (40 mg total) by mouth daily.  90 tablet  3  . hydrochlorothiazide (HYDRODIURIL) 25 MG tablet Take 1 tablet (25 mg total) by mouth daily.  90 tablet  3  . metoprolol succinate (TOPROL-XL) 50 MG 24 hr tablet Take 1 tablet (50 mg total) by mouth daily. Take with or immediately following a meal.  90 tablet  3   No current facility-administered medications for this visit.    Past Medical History  Diagnosis Date  . CAD (coronary artery disease)     2014 75% left main stenosis, LAD 80% followed by a mid subtotal stenosis, circumflex 90% stenosis, right coronary artery occluded, SVG to RCA occluded, SVG ramus occluded, SVG to OM patent without a sequential anastomosis noted. Graft had a focal 99% stenosis which was stented.  3.5 x 16 mm premier  . 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  . Necrotizing fasciitis   . Cogan syndrome   . Hypertension   . Peptic ulcer disease   .  Dyslipidemia   . Diabetes mellitus without complication   . Arthritis   . Gallstones   . GERD (gastroesophageal reflux disease)   . Kidney stones   . Colon polyps   . Hepatic hemangioma 2014  . Cholelithiasis 2014  . Myocardial infarction     Past Surgical History  Procedure Laterality Date  . 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 Coronary artery bypass graft  1999    LIMA to the LAD, saphenous vein graft to obtuse marginal 1 and obtuse marginal 2, spahenous vein graft to intermediate, spahenous vein graft to the right coronary artery in 1999  . Appendectomy  2007    ROS:  As stated in the HPI and negative for all other systems.  PHYSICAL EXAM BP 160/82  Pulse 60  Ht 5\' 9"  (1.753 m)  Wt 199 lb (90.266 kg)  BMI 29.37 kg/m2 GENERAL:  Well appearing NECK:  No jugular venous distention, waveform within normal limits, carotid upstroke brisk and symmetric, soft bilateral carotid bruits bruits, no thyromegaly LYMPHATICS:  No cervical, inguinal  adenopathy LUNGS:  Clear to auscultation bilaterally BACK:  No CVA tenderness CHEST:  Well healed sternotomy scar, , some tenderness to palpation reproducing the above symptoms are. HEART:  PMI not displaced or sustained,S1 and S2 within normal limits, no S3, no S4, no clicks, no rubs, soft early peaking apical systolic murmur heard also at the right upper sternal border murmurs ABD:  Flat, positive bowel sounds normal in frequency in pitch, no bruits, no rebound, no guarding, no midline pulsatile mass, no hepatomegaly, no splenomegaly EXT:  2 plus pulses throughout, mild left leg edema, no cyanosis no clubbing, status post amputation right leg   EKG:  Sinus rhythm, rate 60, borderline interventricular conduction delay, left axis deviation, poor anterior R wave progression, old anteroseptal MI.  09/05/2014   ASSESSMENT AND PLAN  CAD The patient has no new sypmtoms.  No further cardiovascular testing is indicated.  We will continue with aggressive risk reduction and meds as listed.  HTN He will keep a BP diary.  I will make changes on based on these readings.   Carotid stenosis He has left 40-69% bilateral stenosis. We will follow again in March of next year.   Dyslipidemia I will have him go to Dr. Tawanna Sat office for labs to include a lipid profile.

## 2014-09-05 NOTE — Patient Instructions (Addendum)
Your physician recommends that you schedule a follow-up appointment in: one year with Dr. Percival Spanish  We have ordered some labs for you to get done in [facility]

## 2014-09-06 ENCOUNTER — Other Ambulatory Visit: Payer: Self-pay | Admitting: Cardiology

## 2014-09-07 ENCOUNTER — Other Ambulatory Visit: Payer: Medicare Other

## 2014-09-07 LAB — CBC
HEMATOCRIT: 40.6 % (ref 39.0–52.0)
Hemoglobin: 13.7 g/dL (ref 13.0–17.0)
MCH: 32.5 pg (ref 26.0–34.0)
MCHC: 33.7 g/dL (ref 30.0–36.0)
MCV: 96.4 fL (ref 78.0–100.0)
Platelets: 224 10*3/uL (ref 150–400)
RBC: 4.21 MIL/uL — ABNORMAL LOW (ref 4.22–5.81)
RDW: 14.3 % (ref 11.5–15.5)
WBC: 4.6 10*3/uL (ref 4.0–10.5)

## 2014-09-08 LAB — HEMOGLOBIN A1C
Hgb A1c MFr Bld: 5.8 % — ABNORMAL HIGH (ref <5.7)
MEAN PLASMA GLUCOSE: 120 mg/dL — AB (ref <117)

## 2014-09-08 LAB — BASIC METABOLIC PANEL
BUN: 10 mg/dL (ref 6–23)
CALCIUM: 9 mg/dL (ref 8.4–10.5)
CO2: 28 mEq/L (ref 19–32)
CREATININE: 0.96 mg/dL (ref 0.50–1.35)
Chloride: 103 mEq/L (ref 96–112)
GLUCOSE: 107 mg/dL — AB (ref 70–99)
Potassium: 4 mEq/L (ref 3.5–5.3)
SODIUM: 138 meq/L (ref 135–145)

## 2014-09-08 LAB — LIPID PANEL
CHOL/HDL RATIO: 3.1 ratio
Cholesterol: 177 mg/dL (ref 0–200)
HDL: 58 mg/dL (ref >39)
LDL CALC: 100 mg/dL — AB (ref 0–99)
TRIGLYCERIDES: 94 mg/dL (ref <150)
VLDL: 19 mg/dL (ref 0–40)

## 2014-09-09 ENCOUNTER — Other Ambulatory Visit: Payer: Self-pay

## 2014-10-12 ENCOUNTER — Encounter (HOSPITAL_COMMUNITY): Payer: Self-pay | Admitting: Family Medicine

## 2014-10-12 ENCOUNTER — Inpatient Hospital Stay (HOSPITAL_COMMUNITY)
Admission: EM | Admit: 2014-10-12 | Discharge: 2014-10-14 | DRG: 247 | Disposition: A | Discharge status: Home / Self Care | Payer: Medicare Other | Attending: Internal Medicine | Admitting: Internal Medicine

## 2014-10-12 ENCOUNTER — Emergency Department (HOSPITAL_COMMUNITY): Payer: Medicare Other

## 2014-10-12 DIAGNOSIS — Z8249 Family history of ischemic heart disease and other diseases of the circulatory system: Secondary | ICD-10-CM

## 2014-10-12 DIAGNOSIS — Z833 Family history of diabetes mellitus: Secondary | ICD-10-CM | POA: Diagnosis not present

## 2014-10-12 DIAGNOSIS — Z7982 Long term (current) use of aspirin: Secondary | ICD-10-CM | POA: Diagnosis not present

## 2014-10-12 DIAGNOSIS — Z7902 Long term (current) use of antithrombotics/antiplatelets: Secondary | ICD-10-CM | POA: Diagnosis not present

## 2014-10-12 DIAGNOSIS — I70219 Atherosclerosis of native arteries of extremities with intermittent claudication, unspecified extremity: Secondary | ICD-10-CM | POA: Diagnosis present

## 2014-10-12 DIAGNOSIS — Z87891 Personal history of nicotine dependence: Secondary | ICD-10-CM

## 2014-10-12 DIAGNOSIS — I25810 Atherosclerosis of coronary artery bypass graft(s) without angina pectoris: Secondary | ICD-10-CM | POA: Diagnosis present

## 2014-10-12 DIAGNOSIS — I70213 Atherosclerosis of native arteries of extremities with intermittent claudication, bilateral legs: Secondary | ICD-10-CM | POA: Diagnosis present

## 2014-10-12 DIAGNOSIS — E785 Hyperlipidemia, unspecified: Secondary | ICD-10-CM | POA: Diagnosis present

## 2014-10-12 DIAGNOSIS — Y712 Prosthetic and other implants, materials and accessory cardiovascular devices associated with adverse incidents: Secondary | ICD-10-CM | POA: Diagnosis present

## 2014-10-12 DIAGNOSIS — I2510 Atherosclerotic heart disease of native coronary artery without angina pectoris: Secondary | ICD-10-CM | POA: Diagnosis present

## 2014-10-12 DIAGNOSIS — R079 Chest pain, unspecified: Secondary | ICD-10-CM | POA: Diagnosis not present

## 2014-10-12 DIAGNOSIS — E1159 Type 2 diabetes mellitus with other circulatory complications: Secondary | ICD-10-CM | POA: Diagnosis present

## 2014-10-12 DIAGNOSIS — I350 Nonrheumatic aortic (valve) stenosis: Secondary | ICD-10-CM | POA: Diagnosis present

## 2014-10-12 DIAGNOSIS — T82857A Stenosis of cardiac prosthetic devices, implants and grafts, initial encounter: Secondary | ICD-10-CM | POA: Diagnosis present

## 2014-10-12 DIAGNOSIS — Z79899 Other long term (current) drug therapy: Secondary | ICD-10-CM | POA: Diagnosis not present

## 2014-10-12 DIAGNOSIS — I739 Peripheral vascular disease, unspecified: Secondary | ICD-10-CM | POA: Diagnosis not present

## 2014-10-12 DIAGNOSIS — E0959 Drug or chemical induced diabetes mellitus with other circulatory complications: Secondary | ICD-10-CM | POA: Insufficient documentation

## 2014-10-12 DIAGNOSIS — K219 Gastro-esophageal reflux disease without esophagitis: Secondary | ICD-10-CM | POA: Diagnosis present

## 2014-10-12 DIAGNOSIS — E119 Type 2 diabetes mellitus without complications: Secondary | ICD-10-CM | POA: Diagnosis not present

## 2014-10-12 DIAGNOSIS — Z955 Presence of coronary angioplasty implant and graft: Secondary | ICD-10-CM

## 2014-10-12 DIAGNOSIS — I10 Essential (primary) hypertension: Secondary | ICD-10-CM | POA: Diagnosis present

## 2014-10-12 DIAGNOSIS — Y832 Surgical operation with anastomosis, bypass or graft as the cause of abnormal reaction of the patient, or of later complication, without mention of misadventure at the time of the procedure: Secondary | ICD-10-CM | POA: Diagnosis present

## 2014-10-12 DIAGNOSIS — Z89511 Acquired absence of right leg below knee: Secondary | ICD-10-CM | POA: Diagnosis not present

## 2014-10-12 DIAGNOSIS — I080 Rheumatic disorders of both mitral and aortic valves: Secondary | ICD-10-CM | POA: Diagnosis present

## 2014-10-12 DIAGNOSIS — I214 Non-ST elevation (NSTEMI) myocardial infarction: Secondary | ICD-10-CM | POA: Diagnosis present

## 2014-10-12 DIAGNOSIS — I252 Old myocardial infarction: Secondary | ICD-10-CM | POA: Diagnosis not present

## 2014-10-12 DIAGNOSIS — I2582 Chronic total occlusion of coronary artery: Secondary | ICD-10-CM | POA: Diagnosis present

## 2014-10-12 LAB — I-STAT TROPONIN, ED: Troponin i, poc: 0.87 ng/mL (ref 0.00–0.08)

## 2014-10-12 LAB — CBC
HEMATOCRIT: 39.7 % (ref 39.0–52.0)
HEMOGLOBIN: 13.7 g/dL (ref 13.0–17.0)
MCH: 33.1 pg (ref 26.0–34.0)
MCHC: 34.5 g/dL (ref 30.0–36.0)
MCV: 95.9 fL (ref 78.0–100.0)
Platelets: 203 10*3/uL (ref 150–400)
RBC: 4.14 MIL/uL — ABNORMAL LOW (ref 4.22–5.81)
RDW: 12.9 % (ref 11.5–15.5)
WBC: 5.4 10*3/uL (ref 4.0–10.5)

## 2014-10-12 LAB — BASIC METABOLIC PANEL
ANION GAP: 16 — AB (ref 5–15)
BUN: 13 mg/dL (ref 6–23)
CHLORIDE: 100 meq/L (ref 96–112)
CO2: 24 mEq/L (ref 19–32)
CREATININE: 1 mg/dL (ref 0.50–1.35)
Calcium: 8.9 mg/dL (ref 8.4–10.5)
GFR calc non Af Amer: 73 mL/min — ABNORMAL LOW (ref >90)
GFR, EST AFRICAN AMERICAN: 85 mL/min — AB (ref >90)
Glucose, Bld: 112 mg/dL — ABNORMAL HIGH (ref 70–99)
Potassium: 3.7 mEq/L (ref 3.7–5.3)
Sodium: 140 mEq/L (ref 137–147)

## 2014-10-12 LAB — TROPONIN I: Troponin I: 1.62 ng/mL (ref <0.30)

## 2014-10-12 LAB — TSH: TSH: 3.27 u[IU]/mL (ref 0.350–4.500)

## 2014-10-12 LAB — PRO B NATRIURETIC PEPTIDE: PRO B NATRI PEPTIDE: 1035 pg/mL — AB (ref 0–125)

## 2014-10-12 MED ORDER — NITROGLYCERIN 0.4 MG SL SUBL
0.4000 mg | SUBLINGUAL_TABLET | SUBLINGUAL | Status: DC | PRN
  Administered 2014-10-13 (×2): 0.4 mg via SUBLINGUAL

## 2014-10-12 MED ORDER — HEPARIN BOLUS VIA INFUSION
4000.0000 [IU] | Freq: Once | INTRAVENOUS | Status: AC
  Administered 2014-10-12: 4000 [IU] via INTRAVENOUS
  Filled 2014-10-12: qty 4000

## 2014-10-12 MED ORDER — ASPIRIN 81 MG PO CHEW
324.0000 mg | CHEWABLE_TABLET | Freq: Once | ORAL | Status: AC
  Administered 2014-10-12: 324 mg via ORAL
  Filled 2014-10-12: qty 4

## 2014-10-12 MED ORDER — TAMSULOSIN HCL 0.4 MG PO CAPS
0.8000 mg | ORAL_CAPSULE | Freq: Every day | ORAL | Status: DC
Start: 2014-10-13 — End: 2014-10-14
  Administered 2014-10-13 – 2014-10-14 (×2): 0.8 mg via ORAL
  Filled 2014-10-12 (×3): qty 2

## 2014-10-12 MED ORDER — FINASTERIDE 5 MG PO TABS
5.0000 mg | ORAL_TABLET | Freq: Every day | ORAL | Status: DC
  Administered 2014-10-12 – 2014-10-14 (×3): 5 mg via ORAL
  Filled 2014-10-12 (×3): qty 1

## 2014-10-12 MED ORDER — SODIUM CHLORIDE 0.9 % IV SOLN: 250.0000 mL | INTRAVENOUS | Status: DC | PRN

## 2014-10-12 MED ORDER — CALCIUM CARBONATE-VITAMIN D 600-125 MG-UNIT PO TABS: 1.0000 | ORAL_TABLET | Freq: Every day | ORAL | Status: DC

## 2014-10-12 MED ORDER — ASPIRIN 81 MG PO CHEW
81.0000 mg | CHEWABLE_TABLET | ORAL | Status: AC
Start: 2014-10-13 — End: 2014-10-13
  Administered 2014-10-13: 81 mg via ORAL
  Filled 2014-10-12: qty 1

## 2014-10-12 MED ORDER — ACETAMINOPHEN 325 MG PO TABS: 650.0000 mg | ORAL_TABLET | ORAL | Status: DC | PRN

## 2014-10-12 MED ORDER — NITROGLYCERIN 0.4 MG SL SUBL
0.4000 mg | SUBLINGUAL_TABLET | SUBLINGUAL | Status: DC | PRN
  Administered 2014-10-12 (×3): 0.4 mg via SUBLINGUAL
  Filled 2014-10-12 (×2): qty 1

## 2014-10-12 MED ORDER — SODIUM CHLORIDE 0.9 % IJ SOLN: 3.0000 mL | INTRAMUSCULAR | Status: DC | PRN

## 2014-10-12 MED ORDER — HEPARIN (PORCINE) IN NACL 100-0.45 UNIT/ML-% IJ SOLN
1100.0000 [IU]/h | INTRAMUSCULAR | Status: DC
  Administered 2014-10-12 – 2014-10-13 (×2): 1100 [IU]/h via INTRAVENOUS
  Filled 2014-10-12 (×2): qty 250

## 2014-10-12 MED ORDER — PANTOPRAZOLE SODIUM 40 MG PO TBEC
40.0000 mg | DELAYED_RELEASE_TABLET | Freq: Every day | ORAL | Status: DC
  Administered 2014-10-12 – 2014-10-13 (×2): 40 mg via ORAL
  Filled 2014-10-12 (×2): qty 1

## 2014-10-12 MED ORDER — ATORVASTATIN CALCIUM 40 MG PO TABS
40.0000 mg | ORAL_TABLET | Freq: Every day | ORAL | Status: DC
  Administered 2014-10-12: 40 mg via ORAL
  Filled 2014-10-12: qty 1

## 2014-10-12 MED ORDER — CALCIUM CARBONATE-VITAMIN D 500-200 MG-UNIT PO TABS
1.0000 | ORAL_TABLET | Freq: Every day | ORAL | Status: DC
  Administered 2014-10-13 – 2014-10-14 (×2): 1 via ORAL
  Filled 2014-10-12 (×3): qty 1

## 2014-10-12 MED ORDER — ASPIRIN EC 81 MG PO TBEC
81.0000 mg | DELAYED_RELEASE_TABLET | Freq: Every day | ORAL | Status: DC
  Administered 2014-10-14: 81 mg via ORAL
  Filled 2014-10-12 (×2): qty 1

## 2014-10-12 MED ORDER — SODIUM CHLORIDE 0.9 % IV SOLN
INTRAVENOUS | Status: DC
  Administered 2014-10-12 – 2014-10-13 (×2): via INTRAVENOUS

## 2014-10-12 MED ORDER — METOPROLOL SUCCINATE ER 50 MG PO TB24
50.0000 mg | ORAL_TABLET | Freq: Every day | ORAL | Status: DC
  Administered 2014-10-13 – 2014-10-14 (×2): 50 mg via ORAL
  Filled 2014-10-12 (×2): qty 1

## 2014-10-12 MED ORDER — ONDANSETRON HCL 4 MG/2ML IJ SOLN: 4.0000 mg | Freq: Four times a day (QID) | INTRAMUSCULAR | Status: DC | PRN

## 2014-10-12 MED ORDER — AMLODIPINE BESYLATE 5 MG PO TABS
5.0000 mg | ORAL_TABLET | Freq: Every day | ORAL | Status: DC
  Administered 2014-10-12 – 2014-10-14 (×3): 5 mg via ORAL
  Filled 2014-10-12 (×3): qty 1

## 2014-10-12 MED ORDER — SODIUM CHLORIDE 0.9 % IJ SOLN
3.0000 mL | Freq: Two times a day (BID) | INTRAMUSCULAR | Status: DC
  Administered 2014-10-12 – 2014-10-13 (×2): 3 mL via INTRAVENOUS

## 2014-10-12 NOTE — ED Notes (Signed)
[name] (wife) [phone] cell

## 2014-10-12 NOTE — ED Notes (Signed)
Attempted Report 

## 2014-10-12 NOTE — Progress Notes (Addendum)
ANTICOAGULATION CONSULT NOTE - Initial Consult  Pharmacy Consult for Heparin Indication: chest pain/ACS  Allergies  Allergen Reactions  . Altace [Ramipril] Other (See Comments)    hypotension    Patient Measurements: Height: 5\' 8"  (172.7 cm) Weight: 200 lb (90.719 kg) IBW/kg (Calculated) : 68.4 Heparin Dosing Weight: 87 kg  Vital Signs: Temp: 97.3 F (36.3 C) (11/18 1425) Temp Source: Oral (11/18 1425) BP: 147/57 mmHg (11/18 1500) Pulse Rate: 62 (11/18 1500)  Labs:  Recent Labs  10/12/14 1455  HGB 13.7  HCT 39.7  PLT 203    Estimated Creatinine Clearance: 76 mL/min (by C-G formula based on Cr of 0.96).   Medical History: Past Medical History  Diagnosis Date  . CAD (coronary artery disease)     2014 75% left main stenosis, LAD 80% followed by a mid subtotal stenosis, circumflex 90% stenosis, right coronary artery occluded, SVG to RCA occluded, SVG ramus occluded, SVG to OM patent without a sequential anastomosis noted. Graft had a focal 99% stenosis which was stented.  3.5 x 16 mm premier  . 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  . Necrotizing fasciitis   . Cogan syndrome   . Hypertension   . Peptic ulcer disease   . Dyslipidemia   . Diabetes mellitus without complication   . Arthritis   . Gallstones   . GERD (gastroesophageal reflux disease)   . Kidney stones   . Colon polyps   . Hepatic hemangioma 2014  . Cholelithiasis 2014  . Myocardial infarction     Assessment: 73 yo m who presented to the ED on 11/18 for chest pain. Pharmacy is consulted to begin heparin.  Patient is not on any AC PTA.  Hgb 13.7, plts 203, no bleeding reported.    Goal of Therapy:  Heparin level 0.3-0.7 units/ml Monitor platelets by anticoagulation protocol: Yes   Plan:  Heparin bolus 4,000 units x 1 Heparin infusion 1100 units/hr 8-hr HL @ 0000 (midnight) Daily HL and CBC Monitor hgb/plts, s/s of bleeding, clinical course  Cassie L. Nicole Kindred, PharmD Clinical  Pharmacy Resident Pager: [phone] 10/12/2014 3:43 PM

## 2014-10-12 NOTE — ED Notes (Signed)
Per pt sts chest pain that radiates down left arm and shoulder. sts woke him up last night. sts the pain has been constant and relieved with nitro. sts the pain returns within minutes. sts associated with some nausea and SOB.

## 2014-10-12 NOTE — ED Notes (Signed)
i-stat troponin of .87 reported to Dr. Audie Pinto

## 2014-10-12 NOTE — ED Provider Notes (Signed)
CSN: [id number]     Arrival date & time 10/12/14  1422 History   First MD Initiated Contact with Patient 10/12/14 1510     Chief Complaint  Patient presents with  . Chest Pain     (Consider location/radiation/quality/duration/timing/severity/associated sxs/prior Treatment) Patient is a 73 y.o. male presenting with chest pain. The history is provided by the patient. No language interpreter was used.  Chest Pain Pain location:  Substernal area Pain quality: pressure   Pain radiates to:  L shoulder Pain radiates to the back: no   Pain severity:  Moderate Onset quality:  Gradual Duration:  16 hours Timing:  Constant Progression:  Waxing and waning Chronicity:  New Context: at rest   Relieved by: improved with nitroglycerin but not complete resolution. Worsened by:  Nothing tried Ineffective treatments:  Aspirin and rest Associated symptoms: dizziness, nausea and shortness of breath   Associated symptoms: no abdominal pain, no back pain, no cough, no diaphoresis, no dysphagia, no fever, no heartburn, no lower extremity edema, no palpitations, not vomiting and no weakness   Risk factors: coronary artery disease, diabetes mellitus, high cholesterol and hypertension   Risk factors: no prior DVT/PE and no surgery     Past Medical History  Diagnosis Date  . CAD (coronary artery disease)     2014 75% left main stenosis, LAD 80% followed by a mid subtotal stenosis, circumflex 90% stenosis, right coronary artery occluded, SVG to RCA occluded, SVG ramus occluded, SVG to OM patent without a sequential anastomosis noted. Graft had a focal 99% stenosis which was stented.  3.5 x 16 mm premier  . 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  . Necrotizing fasciitis   . Cogan syndrome   . Hypertension   . Peptic ulcer disease   . Dyslipidemia   . Diabetes mellitus without complication   . Arthritis   . Gallstones   . GERD (gastroesophageal reflux disease)   . Kidney stones   . Colon  polyps   . Hepatic hemangioma 2014  . Cholelithiasis 2014  . Myocardial infarction    Past Surgical History  Procedure Laterality Date  . 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 Coronary artery bypass graft  1999    LIMA to the LAD, saphenous vein graft to obtuse marginal 1 and obtuse marginal 2, spahenous vein graft to intermediate, spahenous vein graft to the right coronary artery in 1999  . Appendectomy  2007   Family History  Problem Relation Age of Onset  . Colon cancer Neg Hx   . Hyperlipidemia Mother   . Hypertension Mother   . Other Mother     varicose veins  . Diabetes Sister   . Heart disease Mother   . Heart disease Maternal Grandfather   . Rheum arthritis Father   . Heart attack Father    History  Substance Use Topics  . Smoking status: Former Smoker    Types: Cigarettes    Start date: 12/26/1962    Quit date: 11/25/1997  . Smokeless tobacco: Never Used  . Alcohol Use: 8.4 oz/week    14 Shots of liquor per week     Comment: daily     Review of Systems  Constitutional: Negative for fever and diaphoresis.  HENT: Negative for trouble swallowing.   Respiratory: Positive for chest tightness and shortness of breath. Negative for cough.   Cardiovascular: Positive for chest pain. Negative for palpitations and leg swelling.  Gastrointestinal:  Positive for nausea. Negative for heartburn, vomiting and abdominal pain.  Musculoskeletal: Negative for back pain and neck pain.  Neurological: Positive for dizziness. Negative for syncope and weakness.  Hematological: Does not bruise/bleed easily.  All other systems reviewed and are negative.     Allergies  Altace  Home Medications   Prior to Admission medications   Medication Sig Start Date End Date Taking? Authorizing Provider  acetaminophen (TYLENOL) 500 MG tablet Take 500 mg by mouth every 6 (six) hours as needed for pain.   Yes Historical Provider, MD  amLODipine (NORVASC) 2.5 MG tablet Take 1 tablet (2.5 mg  total) by mouth daily. 01/28/14  Yes Minus Breeding, MD  amLODipine (NORVASC) 5 MG tablet Take 1 tablet (5 mg total) by mouth daily. 01/24/14 01/24/15 Yes Sherren Mocha, MD  aspirin 81 MG tablet Take 81 mg by mouth daily.     Yes Historical Provider, MD  atorvastatin (LIPITOR) 40 MG tablet Take 1 tablet (40 mg total) by mouth daily. 09/05/14  Yes Minus Breeding, MD  Calcium Carbonate-Vitamin D (CALCIUM-CARB 600 + D) 600-125 MG-UNIT TABS Take 1 tablet by mouth daily.     Yes Historical Provider, MD  finasteride (PROSCAR) 5 MG tablet Take 5 mg by mouth daily.   Yes Historical Provider, MD  hydrochlorothiazide (HYDRODIURIL) 25 MG tablet Take 1 tablet (25 mg total) by mouth daily. 09/05/14  Yes Minus Breeding, MD  lisinopril (PRINIVIL,ZESTRIL) 10 MG tablet Take 5-10 mg by mouth 2 (two) times daily. Pt takes 10 mg in the morning and 5 mg at night   Yes Historical Provider, MD  metFORMIN (GLUCOPHAGE) 500 MG tablet Take 1 tablet by mouth  daily   Yes Chipper Herb, MD  metoprolol succinate (TOPROL-XL) 50 MG 24 hr tablet Take 1 tablet (50 mg total) by mouth daily. Take with or immediately following a meal. 09/05/14  Yes Minus Breeding, MD  Multiple Vitamin (MULTIVITAMIN) tablet Take 1 tablet by mouth daily.     Yes Historical Provider, MD  nitroGLYCERIN (NITROSTAT) 0.4 MG SL tablet Place 1 tablet (0.4 mg total) under the tongue every 5 (five) minutes as needed for chest pain. 07/20/13  Yes Rhonda G Barrett, PA-C  NON FORMULARY Take 10-15 mLs by mouth as needed (for sleep). z quill at bedtime   Yes Historical Provider, MD  omeprazole (PRILOSEC) 20 MG capsule Take 1 capsule (20 mg total) by mouth daily. 08/17/13  Yes Jerene Bears, MD  Probiotic Product (RESTORA) CAPS Take 1 capsule by mouth daily. 08/17/13  Yes Jerene Bears, MD  ranitidine (ZANTAC) 75 MG tablet Take 75 mg by mouth at bedtime.    Yes Historical Provider, MD  tamsulosin (FLOMAX) 0.4 MG CAPS Take 0.8 mg by mouth.    Yes Historical Provider, MD  Wheat  Dextrin (BENEFIBER) POWD Take by mouth daily.   Yes Historical Provider, MD   BP 147/57 mmHg  Pulse 62  Temp(Src) 97.3 F (36.3 C) (Oral)  Resp 20  Ht 5\' 8"  (1.727 m)  Wt 200 lb (90.719 kg)  BMI 30.42 kg/m2  SpO2 97% Physical Exam  Constitutional: He is oriented to person, place, and time. He appears well-developed and well-nourished. No distress.  HENT:  Head: Normocephalic.  Neck: Normal range of motion.  Cardiovascular: Normal rate, regular rhythm and intact distal pulses.   Murmur heard.  Systolic murmur is present with a grade of 3/6  Pulmonary/Chest: Effort normal and breath sounds normal. He exhibits no tenderness.  Abdominal: Soft. Bowel  sounds are normal. He exhibits no distension. There is no tenderness.  Musculoskeletal: He exhibits no edema (RLE, LLE BKA).  Neurological: He is alert and oriented to person, place, and time.  Equal grip strength  Skin: Skin is warm and dry. He is not diaphoretic.  Vitals reviewed.   ED Course  Procedures (including critical care time) Labs Review Labs Reviewed  CBC - Abnormal; Notable for the following:    RBC 4.14 (*)    All other components within normal limits  BASIC METABOLIC PANEL - Abnormal; Notable for the following:    Glucose, Bld 112 (*)    GFR calc non Af Amer 73 (*)    GFR calc Af Amer 85 (*)    Anion gap 16 (*)    All other components within normal limits  PRO B NATRIURETIC PEPTIDE - Abnormal; Notable for the following:    Pro B Natriuretic peptide (BNP) 1035.0 (*)    All other components within normal limits  TROPONIN I - Abnormal; Notable for the following:    Troponin I 1.62 (*)    All other components within normal limits  I-STAT TROPOININ, ED - Abnormal; Notable for the following:    Troponin i, poc 0.87 (*)    All other components within normal limits  HEPARIN LEVEL (UNFRACTIONATED)  CBC  HEPARIN LEVEL (UNFRACTIONATED)  TSH  HEMOGLOBIN A1C    Imaging Review Dg Chest Port 1 View  10/12/2014    CLINICAL DATA:  73 year old male with left chest pain radiating to the left upper extremity for 2 days. Initial encounter.  EXAM: PORTABLE CHEST - 1 VIEW  COMPARISON:  07/20/2013 and earlier.  FINDINGS: Portable AP upright view at 1503 hrs. Stable cardiomegaly and mediastinal contours. Sequelae of CABG. Allowing for portable technique, the lungs are clear. No pneumothorax or pleural effusion.  IMPRESSION: No acute cardiopulmonary abnormality.   Electronically Signed   By: Lars Pinks M.D.   On: 10/12/2014 15:15     EKG Interpretation   Date/Time:  Wednesday October 12 2014 14:25:22 EST Ventricular Rate:  63 PR Interval:  222 QRS Duration: 124 QT Interval:  446 QTC Calculation: 456 R Axis:   -62 Text Interpretation:  Sinus rhythm with 1st degree A-V block Left axis  deviation Anteroseptal infarct , age undetermined ST \\T \ T wave  abnormality, consider lateral ischemia Abnormal ECG No significant change  since last tracing Confirmed by BEATON  MD, ROBERT ([id number]) on 10/12/2014  3:20:58 PM      MDM   Final diagnoses:  NSTEMI (non-ST elevated myocardial infarction)    73 y/o male with h/o CABG in 1999, CAD s/p DES to SVD to OM 2014 with chest pain concerning for ACS. Symptom onset last night. Mild improvement with nitro. EKG without acute changes. Troponin elevated concerning for NSTEMI. ASA, nitro given. He denies contraindications for heparin so will initiate. Cardiology consulted and admitting for cath.    Amparo Bristol, MD 10/12/14 [place], MD 10/20/14 [phone]

## 2014-10-12 NOTE — H&P (Signed)
Cardiologist:  [name] Cook is an 73 y.o. male.   Chief Complaint:  Chest Pain HPI:   Patient is 73 year old male with history of coronary artery disease, right BKA, hypertension, dyslipidemia, diabetes mellitus, GERD, Cogan syndrome, peptic ulcer disease, arthritis.  In 2014 he had a 75% left main stenosis, LAD 80% followed by a mid subtotal stenosis, circumflex 90% stenosis, right coronary artery occluded, SVG to RCA occluded, SVG ramus occluded, SVG to OM patent without a sequential anastomosis noted. Graft had a focal 99% stenosis which was stented 3.5 x 16 mm premier.  Patient had 2-D echocardiogram on 07/17/2013 which revealed an ejection fraction 60-65%. Grade 1 diastolic dysfunction mild to moderate aortic stenosis with a mean gradient of 19 mmHg. Mild regurgitation.    Past Medical History  Diagnosis Date  . CAD (coronary artery disease)     2014 75% left main stenosis, LAD 80% followed by a mid subtotal stenosis, circumflex 90% stenosis, right coronary artery occluded, SVG to RCA occluded, SVG ramus occluded, SVG to OM patent without a sequential anastomosis noted. Graft had a focal 99% stenosis which was stented.  3.5 x 16 mm premier  . 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  . Necrotizing fasciitis   . Cogan syndrome   . Hypertension   . Peptic ulcer disease   . Dyslipidemia   . Diabetes mellitus without complication   . Arthritis   . Gallstones   . GERD (gastroesophageal reflux disease)   . Kidney stones   . Colon polyps   . Hepatic hemangioma 2014  . Cholelithiasis 2014  . Myocardial infarction     Past Surgical History  Procedure Laterality Date  . 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 Coronary artery bypass graft  1999    LIMA to the LAD, saphenous vein graft to obtuse marginal 1 and obtuse marginal 2, spahenous vein graft to intermediate, spahenous vein graft to the right coronary artery in 1999  . Appendectomy  2007    Family  History  Problem Relation Age of Onset  . Colon cancer Neg Hx   . Hyperlipidemia Mother   . Hypertension Mother   . Other Mother     varicose veins  . Diabetes Sister   . Heart disease Mother   . Heart disease Maternal Grandfather   . Rheum arthritis Father   . Heart attack Father    Social History:  reports that he quit smoking about 16 years ago. His smoking use included Cigarettes. He started smoking about 51 years ago. He smoked 0.00 packs per day. He has never used smokeless tobacco. He reports that he drinks about 8.4 oz of alcohol per week. He reports that he does not use illicit drugs.  Allergies:  Allergies  Allergen Reactions  . Altace [Ramipril] Other (See Comments)    hypotension     (Not in a hospital admission)  Results for orders placed or performed during the hospital encounter of 10/12/14 (from the past 48 hour(s))  CBC     Status: Abnormal   Collection Time: 10/12/14  2:55 PM  Result Value Ref Range   WBC 5.4 4.0 - 10.5 K/uL   RBC 4.14 (L) 4.22 - 5.81 MIL/uL   Hemoglobin 13.7 13.0 - 17.0 g/dL   HCT 39.7 39.0 - 52.0 %   MCV 95.9 78.0 - 100.0 fL   MCH 33.1 26.0 - 34.0 pg   MCHC 34.5  30.0 - 36.0 g/dL   RDW 12.9 11.5 - 15.5 %   Platelets 203 150 - 400 K/uL  Basic metabolic panel     Status: Abnormal   Collection Time: 10/12/14  2:55 PM  Result Value Ref Range   Sodium 140 137 - 147 mEq/L   Potassium 3.7 3.7 - 5.3 mEq/L   Chloride 100 96 - 112 mEq/L   CO2 24 19 - 32 mEq/L   Glucose, Bld 112 (H) 70 - 99 mg/dL   BUN 13 6 - 23 mg/dL   Creatinine, Ser 1.00 0.50 - 1.35 mg/dL   Calcium 8.9 8.4 - 10.5 mg/dL   GFR calc non Af Amer 73 (L) >90 mL/min   GFR calc Af Amer 85 (L) >90 mL/min    Comment: (NOTE) The eGFR has been calculated using the CKD EPI equation. This calculation has not been validated in all clinical situations. eGFR's persistently <90 mL/min signify possible Chronic Kidney Disease.    Anion gap 16 (H) 5 - 15  BNP (order ONLY if patient  complains of dyspnea/SOB AND you have documented it for THIS visit)     Status: Abnormal   Collection Time: 10/12/14  2:55 PM  Result Value Ref Range   Pro B Natriuretic peptide (BNP) 1035.0 (H) 0 - 125 pg/mL  Troponin I     Status: Abnormal   Collection Time: 10/12/14  2:55 PM  Result Value Ref Range   Troponin I 1.62 (HH) <0.30 ng/mL    Comment:        Due to the release kinetics of cTnI, a negative result within the first hours of the onset of symptoms does not rule out myocardial infarction with certainty. If myocardial infarction is still suspected, repeat the test at appropriate intervals. CRITICAL RESULT CALLED TO, READ BACK BY AND VERIFIED WITH: A HENIDPOE,UM [id number] 10/12/14 D BRADLEY   I-stat troponin, ED (not at [facility])     Status: Abnormal   Collection Time: 10/12/14  3:12 PM  Result Value Ref Range   Troponin i, poc 0.87 (HH) 0.00 - 0.08 ng/mL   Comment 3            Comment: Due to the release kinetics of cTnI, a negative result within the first hours of the onset of symptoms does not rule out myocardial infarction with certainty. If myocardial infarction is still suspected, repeat the test at appropriate intervals.    Dg Chest Port 1 View  10/12/2014   CLINICAL DATA:  73 year old male with left chest pain radiating to the left upper extremity for 2 days. Initial encounter.  EXAM: PORTABLE CHEST - 1 VIEW  COMPARISON:  07/20/2013 and earlier.  FINDINGS: Portable AP upright view at 1503 hrs. Stable cardiomegaly and mediastinal contours. Sequelae of CABG. Allowing for portable technique, the lungs are clear. No pneumothorax or pleural effusion.  IMPRESSION: No acute cardiopulmonary abnormality.   Electronically Signed   By: Lars Pinks M.D.   On: 10/12/2014 15:15    Review of Systems  All other systems reviewed and are negative.   Blood pressure 123/54, pulse 58, temperature 97.3 F (36.3 C), temperature source Oral, resp. rate 19, height $RemoveBe'5\' 8"'WMEfLOGSU$  (1.727 m), weight 200 lb  (90.719 kg), SpO2 97 %. Physical Exam   Assessment/Plan Principal Problem:   NSTEMI (non-ST elevated myocardial infarction) Active Problems:   Dyslipidemia   Essential hypertension   Coronary atherosclerosis   Peripheral vascular disease   Diabetes mellitus without complication   Atherosclerosis of native  arteries of extremity with intermittent claudication    Arlethia Basso, PA-C 10/12/2014, 6:27 PM

## 2014-10-12 NOTE — ED Notes (Signed)
Pts home nitroglycerin expired 8/15.

## 2014-10-12 NOTE — ED Notes (Signed)
Dr. Taylor at bedside.

## 2014-10-13 ENCOUNTER — Encounter (HOSPITAL_COMMUNITY)
Admission: EM | Disposition: A | Discharge status: Home / Self Care | Payer: Medicare Other | Source: Home / Self Care | Attending: Internal Medicine

## 2014-10-13 DIAGNOSIS — E119 Type 2 diabetes mellitus without complications: Secondary | ICD-10-CM

## 2014-10-13 DIAGNOSIS — E785 Hyperlipidemia, unspecified: Secondary | ICD-10-CM

## 2014-10-13 DIAGNOSIS — I25810 Atherosclerosis of coronary artery bypass graft(s) without angina pectoris: Secondary | ICD-10-CM

## 2014-10-13 DIAGNOSIS — I214 Non-ST elevation (NSTEMI) myocardial infarction: Principal | ICD-10-CM

## 2014-10-13 HISTORY — PX: LEFT HEART CATHETERIZATION WITH CORONARY ANGIOGRAM: SHX5451

## 2014-10-13 LAB — GLUCOSE, CAPILLARY
GLUCOSE-CAPILLARY: 119 mg/dL — AB (ref 70–99)
Glucose-Capillary: 117 mg/dL — ABNORMAL HIGH (ref 70–99)

## 2014-10-13 LAB — CBC
HCT: 38.1 % — ABNORMAL LOW (ref 39.0–52.0)
HEMOGLOBIN: 13 g/dL (ref 13.0–17.0)
MCH: 32 pg (ref 26.0–34.0)
MCHC: 34.1 g/dL (ref 30.0–36.0)
MCV: 93.8 fL (ref 78.0–100.0)
Platelets: 206 10*3/uL (ref 150–400)
RBC: 4.06 MIL/uL — ABNORMAL LOW (ref 4.22–5.81)
RDW: 13.1 % (ref 11.5–15.5)
WBC: 5.8 10*3/uL (ref 4.0–10.5)

## 2014-10-13 LAB — POCT ACTIVATED CLOTTING TIME: Activated Clotting Time: 512 seconds

## 2014-10-13 LAB — PROTIME-INR
INR: 1.11 (ref 0.00–1.49)
Prothrombin Time: 14.5 seconds (ref 11.6–15.2)

## 2014-10-13 LAB — HEPARIN LEVEL (UNFRACTIONATED)
Heparin Unfractionated: 0.34 IU/mL (ref 0.30–0.70)
Heparin Unfractionated: 0.4 IU/mL (ref 0.30–0.70)

## 2014-10-13 LAB — HEMOGLOBIN A1C
HEMOGLOBIN A1C: 6.1 % — AB (ref <5.7)
Mean Plasma Glucose: 128 mg/dL — ABNORMAL HIGH (ref <117)

## 2014-10-13 SURGERY — LEFT HEART CATHETERIZATION WITH CORONARY ANGIOGRAM
Anesthesia: LOCAL

## 2014-10-13 MED ORDER — FENTANYL CITRATE 0.05 MG/ML IJ SOLN
INTRAMUSCULAR | Status: AC
  Filled 2014-10-13: qty 2

## 2014-10-13 MED ORDER — HEART ATTACK BOUNCING BOOK
Freq: Once | Status: AC
  Administered 2014-10-13: 22:00:00
  Filled 2014-10-13: qty 1

## 2014-10-13 MED ORDER — ASPIRIN 81 MG PO CHEW
81.0000 mg | CHEWABLE_TABLET | Freq: Every day | ORAL | Status: DC
  Filled 2014-10-13: qty 1

## 2014-10-13 MED ORDER — MIDAZOLAM HCL 2 MG/2ML IJ SOLN
INTRAMUSCULAR | Status: AC
  Filled 2014-10-13: qty 2

## 2014-10-13 MED ORDER — PRASUGREL HCL 10 MG PO TABS
10.0000 mg | ORAL_TABLET | Freq: Every day | ORAL | Status: DC
  Administered 2014-10-14: 10:00:00 10 mg via ORAL
  Filled 2014-10-13 (×2): qty 1

## 2014-10-13 MED ORDER — LIVING WELL WITH DIABETES BOOK
Freq: Once | Status: AC
  Administered 2014-10-13: 22:00:00
  Filled 2014-10-13: qty 1

## 2014-10-13 MED ORDER — NITROGLYCERIN IN D5W 200-5 MCG/ML-% IV SOLN
5.0000 ug/min | INTRAVENOUS | Status: DC
  Administered 2014-10-13: 5 ug/min via INTRAVENOUS
  Filled 2014-10-13: qty 250

## 2014-10-13 MED ORDER — BIVALIRUDIN 250 MG IV SOLR
INTRAVENOUS | Status: AC
  Filled 2014-10-13: qty 250

## 2014-10-13 MED ORDER — LIDOCAINE HCL (PF) 1 % IJ SOLN
INTRAMUSCULAR | Status: AC
Start: 2014-10-13 — End: 2014-10-13
  Filled 2014-10-13: qty 30

## 2014-10-13 MED ORDER — SODIUM CHLORIDE 0.9 % IV SOLN: 1.0000 mL/kg/h | INTRAVENOUS | Status: AC

## 2014-10-13 MED ORDER — HEPARIN (PORCINE) IN NACL 2-0.9 UNIT/ML-% IJ SOLN
INTRAMUSCULAR | Status: AC
  Filled 2014-10-13: qty 1000

## 2014-10-13 MED ORDER — ATORVASTATIN CALCIUM 80 MG PO TABS
80.0000 mg | ORAL_TABLET | Freq: Every day | ORAL | Status: DC
  Administered 2014-10-13: 80 mg via ORAL
  Filled 2014-10-13 (×2): qty 1

## 2014-10-13 MED ORDER — VERAPAMIL HCL 2.5 MG/ML IV SOLN
INTRAVENOUS | Status: AC
  Filled 2014-10-13: qty 2

## 2014-10-13 NOTE — Plan of Care (Signed)
Problem: Consults Goal: Skin Care Protocol Initiated - if Braden Score 18 or less If consults are not indicated, leave blank or document N/A Outcome: Not Applicable Date Met:  21/62/44 braden > 18 Goal: Tobacco Cessation referral if indicated Outcome: Not Applicable Date Met:  69/50/72 Quit in 1999 Goal: Nutrition Consult-if indicated Outcome: Not Applicable Date Met:  25/75/05  Problem: Phase I Progression Outcomes Goal: Hemodynamically stable Outcome: Completed/Met Date Met:  10/13/14 Goal: Anginal pain relieved Outcome: Completed/Met Date Met:  10/13/14 Goal: Aspirin unless contraindicated Outcome: Completed/Met Date Met:  10/13/14 Pt given 324 mg of ASA in the ED. Goal: MD aware of Cardiac Marker results Outcome: Completed/Met Date Met:  10/13/14 Enzymes positive pt on heparin gtt & awaiting cath tomorrow Goal: Voiding-avoid urinary catheter unless indicated Outcome: Completed/Met Date Met:  10/13/14 Goal: Other Phase I Outcomes/Goals Outcome: Completed/Met Date Met:  10/13/14

## 2014-10-13 NOTE — Plan of Care (Signed)
Problem: Consults Goal: Diabetes Guidelines if Diabetic/Glucose > 140 If diabetic or lab glucose is > 140 mg/dl - Initiate Diabetes/Hyperglycemia Guidelines & Document Interventions  Outcome: Completed/Met Date Met:  10/13/14     

## 2014-10-13 NOTE — Plan of Care (Signed)
Problem: Consults Goal: Chest Pain Patient Education (See Patient Education module for education specifics.)  Outcome: Completed/Met Date Met:  10/13/14  Problem: Phase III Progression Outcomes Goal: Hemodynamically stable Outcome: Completed/Met Date Met:  10/13/14 Goal: No anginal pain Outcome: Completed/Met Date Met:  10/13/14 Goal: Cath/PCI Path as indicated Outcome: Completed/Met Date Met:  10/13/14 Goal: Vascular site scale level 0 - I Vascular Site Scale Level 0: No bruising/bleeding/hematoma Level I (Mild): Bruising/Ecchymosis, minimal bleeding/ooozing, palpable hematoma < 3 cm Level II (Moderate): Bleeding not affecting hemodynamic parameters, pseudoaneurysm, palpable hematoma > 3 cm Level III (Severe) Bleeding which affects hemodynamic parameters or retroperitoneal hemorrhage  Outcome: Completed/Met Date Met:  10/13/14 Goal: Discharge plan remains appropriate-arrangements made Outcome: Completed/Met Date Met:  10/13/14 Goal: Tolerating diet Outcome: Completed/Met Date Met:  10/13/14 Goal: If positive for MI, change to MI Path Outcome: Completed/Met Date Met:  10/13/14 Goal: Other Phase III Outcomes/Goals Outcome: Not Applicable Date Met:  41/93/79

## 2014-10-13 NOTE — Progress Notes (Signed)
ANTICOAGULATION CONSULT NOTE - Initial Consult  Pharmacy Consult for Heparin Indication: chest pain/ACS  Allergies  Allergen Reactions  . Altace [Ramipril] Other (See Comments)    hypotension    Patient Measurements: Height: 5\' 8"  (172.7 cm) Weight: 200 lb 12.8 oz (91.082 kg) IBW/kg (Calculated) : 68.4 Heparin Dosing Weight: 87 kg  Vital Signs: Temp: 97.7 F (36.5 C) (11/18 2122) Temp Source: Oral (11/18 2122) BP: 151/71 mmHg (11/18 2122) Pulse Rate: 68 (11/18 2122)  Labs:  Recent Labs  10/12/14 1455 10/13/14 0025  HGB 13.7  --   HCT 39.7  --   PLT 203  --   HEPARINUNFRC  --  0.40  CREATININE 1.00  --   TROPONINI 1.62*  --     Estimated Creatinine Clearance: 73.2 mL/min (by C-G formula based on Cr of 1).   Medical History: Past Medical History  Diagnosis Date  . CAD (coronary artery disease)     2014 75% left main stenosis, LAD 80% followed by a mid subtotal stenosis, circumflex 90% stenosis, right coronary artery occluded, SVG to RCA occluded, SVG ramus occluded, SVG to OM patent without a sequential anastomosis noted. Graft had a focal 99% stenosis which was stented.  3.5 x 16 mm premier  . 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  . Necrotizing fasciitis   . Cogan syndrome   . Hypertension   . Peptic ulcer disease   . Dyslipidemia   . Diabetes mellitus without complication   . Arthritis   . Gallstones   . GERD (gastroesophageal reflux disease)   . Kidney stones   . Colon polyps   . Hepatic hemangioma 2014  . Cholelithiasis 2014  . Myocardial infarction     Assessment: 73 yo m who presented to the ED on 11/18 for chest pain. Pharmacy is consulted to begin heparin.  Patient is not on any AC PTA.  Hgb 13.7, plts 203,  HL #1 is therapuetic at 0.40 IU/ml     Goal of Therapy:  Heparin level 0.3-0.7 units/ml Monitor platelets by anticoagulation protocol: Yes   Plan:  Continue heparin at 1100 units/hr and f/u am labs.   Curlene Dolphin

## 2014-10-13 NOTE — Progress Notes (Signed)
TR BAND REMOVAL  LOCATION:    left radial  DEFLATED PER PROTOCOL:    Yes.    TIME BAND OFF / DRESSING APPLIED:    21:30   SITE UPON ARRIVAL:    Level 0  SITE AFTER BAND REMOVAL:    Level 1  REVERSE ALLEN'S TEST:     positive  CIRCULATION SENSATION AND MOVEMENT:    Within Normal Limits   Yes.    COMMENTS:   Bruise

## 2014-10-13 NOTE — H&P (View-Only) (Signed)
Patient Name: [name] [name] Date of Encounter: 10/13/2014    Principal Problem:   NSTEMI (non-ST elevated myocardial infarction) Active Problems:   Coronary atherosclerosis   Dyslipidemia   Essential hypertension   Diabetes mellitus with circulatory complication   Peripheral vascular disease   Atherosclerosis of native arteries of extremity with intermittent claudication    SUBJECTIVE  Had c/p overnight - relieved with ntg gtt.  None since.  For cath today.  CURRENT MEDS . amLODipine  5 mg Oral Daily  . [START ON 10/14/2014] aspirin EC  81 mg Oral Daily  . atorvastatin  40 mg Oral q1800  . calcium-vitamin D  1 tablet Oral Q breakfast  . finasteride  5 mg Oral Daily  . metoprolol succinate  50 mg Oral Daily  . pantoprazole  40 mg Oral Daily  . sodium chloride  3 mL Intravenous Q12H  . tamsulosin  0.8 mg Oral QPC breakfast    OBJECTIVE  Filed Vitals:   10/12/14 2030 10/12/14 2122 10/13/14 0300 10/13/14 0540  BP: 150/62 151/71 136/60 138/59  Pulse: 68 68 74 65  Temp:  97.7 F (36.5 C)  97.8 F (36.6 C)  TempSrc:  Oral  Oral  Resp: 18 18  18   Height:  5\' 8"  (1.727 m)    Weight:  200 lb 12.8 oz (91.082 kg)  200 lb (90.719 kg)  SpO2: 94% 98%  97%    Intake/Output Summary (Last 24 hours) at 10/13/14 0810 Last data filed at 10/13/14 0600  Gross per 24 hour  Intake  825.9 ml  Output    375 ml  Net  450.9 ml   Filed Weights   10/12/14 1425 10/12/14 2122 10/13/14 0540  Weight: 200 lb (90.719 kg) 200 lb 12.8 oz (91.082 kg) 200 lb (90.719 kg)    PHYSICAL EXAM  General: Pleasant, NAD. Neuro: Alert and oriented X 3. Moves all extremities spontaneously. Psych: Normal affect. HEENT:  Normal  Neck: Supple w/o jvd.  Soft bilat bruits. Lungs:  Resp regular and unlabored, CTA. Heart: RRR no s3, s4, or murmurs. Abdomen: Soft, non-tender, non-distended, BS + x 4.  Extremities: No clubbing, cyanosis or edema.  Radials 2+ and equal bilaterally.  R BKA.  DP on L  1+.  Accessory Clinical Findings  CBC  Recent Labs  10/12/14 1455 10/13/14 0346  WBC 5.4 5.8  HGB 13.7 13.0  HCT 39.7 38.1*  MCV 95.9 93.8  PLT 203 644   Basic Metabolic Panel  Recent Labs  10/12/14 1455  NA 140  K 3.7  CL 100  CO2 24  GLUCOSE 112*  BUN 13  CREATININE 1.00  CALCIUM 8.9   Cardiac Enzymes  Recent Labs  10/12/14 1455  TROPONINI 1.62*   Thyroid Function Tests  Recent Labs  10/12/14 2254  TSH 3.270    TELE  Rsr, pvc's  ECG  Rsr, 76, lad, twi i/avl, ant infarct - no acute st/t changes.  Radiology/Studies  Dg Chest Port 1 View  10/12/2014   CLINICAL DATA:  73 year old male with left chest pain radiating to the left upper extremity for 2 days. Initial encounter.  EXAM: PORTABLE CHEST - 1 VIEW  COMPARISON:  07/20/2013 and earlier.  FINDINGS: Portable AP upright view at 1503 hrs. Stable cardiomegaly and mediastinal contours. Sequelae of CABG. Allowing for portable technique, the lungs are clear. No pneumothorax or pleural effusion.  IMPRESSION: No acute cardiopulmonary abnormality.   Electronically Signed   By: Lars Pinks M.D.   On:  10/12/2014 15:15    ASSESSMENT AND PLAN  1.  NSTEMI/CAD:  Presented w/ c/p and elevated trop yesterday.  Recurrent c/p overnight - relieved with ntg.  Cont asa, statin, bb, heparin, nitrate.  Cath today.  Will need cardiac rehab.  2.  HTN: BP stable this AM.  Follow on ntg. Cont bb/ccb.  On acei/hctz @ home - on hold pending cath.  3.  HL:  LDL 100 in October.  LFT's nl in 06/2013 - should have f/u.  In setting of ACS, will increase lipitor to 80.  4.  DMII:  Glucose stable.  Metformin on hold.  Cont SSI.  Signed, Murray Hodgkins NP Agree with above. Patient is currently chest pain free on IV NTG drip, awaiting cath.  Lungs clear.  Heart no gallop. EKG reviewed.  Shows anterolateral ST depression.

## 2014-10-13 NOTE — Progress Notes (Signed)
At 0304 Pt c/o 8/10 chest pain to center of chest. BP 136/60 HR 74 and sats 96% on RA. 12 lead EKG done with some ST depression in V5-6. Pt given 1 SL NTG and 02 applied. Pt got complete relief within 5 min. BP 129/58 HR 73. Dr. Alejandro Mulling notified. Cont to monitor.

## 2014-10-13 NOTE — Progress Notes (Signed)
Patient Name: [name] [name] Date of Encounter: 10/13/2014    Principal Problem:   NSTEMI (non-ST elevated myocardial infarction) Active Problems:   Coronary atherosclerosis   Dyslipidemia   Essential hypertension   Diabetes mellitus with circulatory complication   Peripheral vascular disease   Atherosclerosis of native arteries of extremity with intermittent claudication    SUBJECTIVE  Had c/p overnight - relieved with ntg gtt.  None since.  For cath today.  CURRENT MEDS . amLODipine  5 mg Oral Daily  . [START ON 10/14/2014] aspirin EC  81 mg Oral Daily  . atorvastatin  40 mg Oral q1800  . calcium-vitamin D  1 tablet Oral Q breakfast  . finasteride  5 mg Oral Daily  . metoprolol succinate  50 mg Oral Daily  . pantoprazole  40 mg Oral Daily  . sodium chloride  3 mL Intravenous Q12H  . tamsulosin  0.8 mg Oral QPC breakfast    OBJECTIVE  Filed Vitals:   10/12/14 2030 10/12/14 2122 10/13/14 0300 10/13/14 0540  BP: 150/62 151/71 136/60 138/59  Pulse: 68 68 74 65  Temp:  97.7 F (36.5 C)  97.8 F (36.6 C)  TempSrc:  Oral  Oral  Resp: 18 18  18   Height:  5\' 8"  (1.727 m)    Weight:  200 lb 12.8 oz (91.082 kg)  200 lb (90.719 kg)  SpO2: 94% 98%  97%    Intake/Output Summary (Last 24 hours) at 10/13/14 0810 Last data filed at 10/13/14 0600  Gross per 24 hour  Intake  825.9 ml  Output    375 ml  Net  450.9 ml   Filed Weights   10/12/14 1425 10/12/14 2122 10/13/14 0540  Weight: 200 lb (90.719 kg) 200 lb 12.8 oz (91.082 kg) 200 lb (90.719 kg)    PHYSICAL EXAM  General: Pleasant, NAD. Neuro: Alert and oriented X 3. Moves all extremities spontaneously. Psych: Normal affect. HEENT:  Normal  Neck: Supple w/o jvd.  Soft bilat bruits. Lungs:  Resp regular and unlabored, CTA. Heart: RRR no s3, s4, or murmurs. Abdomen: Soft, non-tender, non-distended, BS + x 4.  Extremities: No clubbing, cyanosis or edema.  Radials 2+ and equal bilaterally.  R BKA.  DP on L  1+.  Accessory Clinical Findings  CBC  Recent Labs  10/12/14 1455 10/13/14 0346  WBC 5.4 5.8  HGB 13.7 13.0  HCT 39.7 38.1*  MCV 95.9 93.8  PLT 203 532   Basic Metabolic Panel  Recent Labs  10/12/14 1455  NA 140  K 3.7  CL 100  CO2 24  GLUCOSE 112*  BUN 13  CREATININE 1.00  CALCIUM 8.9   Cardiac Enzymes  Recent Labs  10/12/14 1455  TROPONINI 1.62*   Thyroid Function Tests  Recent Labs  10/12/14 2254  TSH 3.270    TELE  Rsr, pvc's  ECG  Rsr, 76, lad, twi i/avl, ant infarct - no acute st/t changes.  Radiology/Studies  Dg Chest Port 1 View  10/12/2014   CLINICAL DATA:  73 year old male with left chest pain radiating to the left upper extremity for 2 days. Initial encounter.  EXAM: PORTABLE CHEST - 1 VIEW  COMPARISON:  07/20/2013 and earlier.  FINDINGS: Portable AP upright view at 1503 hrs. Stable cardiomegaly and mediastinal contours. Sequelae of CABG. Allowing for portable technique, the lungs are clear. No pneumothorax or pleural effusion.  IMPRESSION: No acute cardiopulmonary abnormality.   Electronically Signed   By: Lars Pinks M.D.   On:  10/12/2014 15:15    ASSESSMENT AND PLAN  1.  NSTEMI/CAD:  Presented w/ c/p and elevated trop yesterday.  Recurrent c/p overnight - relieved with ntg.  Cont asa, statin, bb, heparin, nitrate.  Cath today.  Will need cardiac rehab.  2.  HTN: BP stable this AM.  Follow on ntg. Cont bb/ccb.  On acei/hctz @ home - on hold pending cath.  3.  HL:  LDL 100 in October.  LFT's nl in 06/2013 - should have f/u.  In setting of ACS, will increase lipitor to 80.  4.  DMII:  Glucose stable.  Metformin on hold.  Cont SSI.  Signed, Murray Hodgkins NP Agree with above. Patient is currently chest pain free on IV NTG drip, awaiting cath.  Lungs clear.  Heart no gallop. EKG reviewed.  Shows anterolateral ST depression.

## 2014-10-13 NOTE — Interval H&P Note (Signed)
History and Physical Interval Note:  10/13/2014 3:02 PM  [name] [name]  has presented today for surgery, with the diagnosis of unstable angina  The various methods of treatment have been discussed with the patient and family. After consideration of risks, benefits and other options for treatment, the patient has consented to  Procedure(s): LEFT HEART CATHETERIZATION WITH CORONARY ANGIOGRAM (N/A) as a surgical intervention .  The patient's history has been reviewed, patient examined, no change in status, stable for surgery.  I have reviewed the patient's chart and labs.  Questions were answered to the patient's satisfaction.   Cath Lab Visit (complete for each Cath Lab visit)  Clinical Evaluation Leading to the Procedure:   ACS: Yes.    Non-ACS:    Anginal Classification: CCS IV  Anti-ischemic medical therapy: Maximal Therapy (2 or more classes of medications)  Non-Invasive Test Results: No non-invasive testing performed  Prior CABG: Previous CABG        Collier [facility] 10/13/2014 3:02 PM

## 2014-10-13 NOTE — Care Management Note (Addendum)
  Page 2 of 2   10/17/2014     6:01:08 PM CARE MANAGEMENT NOTE 10/17/2014  Patient:  [name],[name]   Account Number:  [id number]  Date Initiated:  10/13/2014  Documentation initiated by:  GRAVES-BIGELOW,BRENDA  Subjective/Objective Assessment:   Pt admitted for cp- Nstemi. Initiated on Nitro and heparin gtt. Plan for cardiac cath today.     Action/Plan:   CM will monitor for disposition needs.   Anticipated DC Date:  10/15/2014   Anticipated DC Plan:  [place]  CM consult  Medication Assistance      Choice offered to / List presented to:             Status of service:  Completed, signed off Medicare Important Message given?   (If response is "NO", the following Medicare IM given date fields will be blank) Date Medicare IM given:   Medicare IM given by:   Date Additional Medicare IM given:   Additional Medicare IM given by:    Discharge Disposition:  HOME/SELF CARE  Per UR Regulation:  Reviewed for med. necessity/level of care/duration of stay  If discussed at [place] of Stay Meetings, dates discussed:    Comments:  [name] Maqueda RN, BSN, MSHL, CCM  Nurse - Case Manager,  (Unit [phone]  10/17/2014 CM discussed with family prior to d/c; wife states gross income too high to be eligible for medication assistance. CM provided 30 day free card. Update per ---10/14/2014 1149 by Memory Argue--- S/W DESIREE  @ [phone] PRASUGREL -NOT COVER EFFIENT  10 MG  BID COVER-YES CO-PAY- $ 409.25 PRIOR APPROVAL -NO PHARMACY- ANY RETAIL  [name] Ardila RN, BSN, MSHL, CCM  Nurse - Case Manager,  (Unit [phone]  10/14/2014 Update per ---10/14/2014 0942 by Memory Argue--- I [place]: no rx coverage I HAVE CALL [employer] [place] : no rx coverage WILL YOU CHECK WITH PATIENT THANKS  [name] Orlick RN, BSN, MSHL, CCM  Nurse - Case Manager,  (Unit [phone]  10/14/2014 prasugrel  (EFFIENT) tablet 10 mg  - Benefits check in progress

## 2014-10-13 NOTE — CV Procedure (Signed)
    Cardiac Catheterization Procedure Note  Name: Burnett Armetta MRN: [id number] DOB: 06/14/1941  Procedure: Left Heart Cath, Selective Coronary Angiography, SVG and LIMA angiography,  LV angiography, PTCA and stenting of the SVG to OM.  Indication: 73 yo WM with history of CAD s/p CABG presents with NSTEMI. He had prior stenting of the SVG to OM with DES in August 2014.   Procedural Details:  The left wrist was prepped, draped, and anesthetized with 1% lidocaine. Using the modified Seldinger technique, a 6 French slender sheath was introduced into the right radial artery. 3 mg of verapamil was administered through the sheath, weight-based unfractionated heparin was administered intravenously. Standard Judkins catheters were used for selective coronary and SVG angiography and left ventriculography. an IMA catheter was used to image the LIMA.  Catheter exchanges were performed over an exchange length guidewire.  PROCEDURAL FINDINGS Hemodynamics: AO 108/52 mean 76 mm Hg LV 116/18 mm Hg   Coronary angiography: Coronary dominance: right  Left mainstem: Normal.  Left anterior descending (LAD): The LAD has a long 80-90% stenosis in the proximal vessel. It is occluded in the mid vessel.  Ramus intermediate is patent with 30% ostial disease.  Left circumflex (LCx): The LCx has a subtotal occlusion in the proximal vessel and is then occluded in the mid vessel.  Right coronary artery (RCA): The RCA has a 50-60% stenosis in the mid vessel. The RCA then has subtotal occlusion in the mid to distal vessel following the RV marginal branch.  The SVG to the RCA is occluded.  The SVG to the ramus intermediate is occluded.  The SVG to the OM is patent but there is a 90-95% in-stent restenosis in the mid SVG.  The LIMA to the LAD is patent.   Left ventriculography: Left ventricular systolic function is abnormal, there is moderate hypokinesis of the inferior wall. LVEF is estimated at 45-50%, there  is moderate mitral regurgitation   PCI Note:  Following the diagnostic procedure, the decision was made to proceed with PCI. Effient 60 mg was given orally.   Weight-based bivalirudin was given for anticoagulation. Once a therapeutic ACT was achieved, a 6 French LA1 guide catheter was inserted.  A 3.5-5.5 filter coronary guidewire was used to cross the lesion and the filter was deployed.  The lesion was predilated with a 2.5 mm balloon.  The lesion was then stented with a 3.5 x 18 mm Xience Alpine stent dilated to 14 atm.   The filter was then retrieved without problems.  Following PCI, there was 0% residual stenosis and TIMI-3 flow. Final angiography confirmed an excellent result. The patient tolerated the procedure well. There were no immediate procedural complications. A TR band was used for radial hemostasis. The patient was transferred to the post catheterization recovery area for further monitoring.  PCI Data: Vessel - SVG to OM/Segment - mid Percent Stenosis (pre)  90-95% TIMI-flow 2 Stent 3.5 x 18 mm Xience Alpine Percent Stenosis (post) 0% TIMI-flow (post) 3  Final Conclusions:   1. Severe 3 vessel obstructive CAD 2. Chronic occlusion of SVG to RCA and SVG to ramus intermediate. 3. Restenosis in-stent in SVG to OM 4. Patent LIMA to LAD 5. Mild LV dysfunction with inferior hypokinesis. 6. Moderate MR. 7. Successful stenting of the SVG to OM with DES.   Recommendations:  Continue DAPT indefinitely with 2 layers of stent in old SVG. Anticipate DC in am.   , MDFACC 10/13/2014, 3:58 PM     

## 2014-10-13 NOTE — Progress Notes (Signed)
UR Completed Jaunita Mikels Graves-Bigelow, RN,BSN [phone]  

## 2014-10-13 NOTE — Progress Notes (Signed)
At 0350 Pt c/o 2/10 CP to center of chest. BP 145/67 with HR 68 and sats 99% on 2L [place]. Pt given 1 SL NTG with complete relief within 5 min. Dr. Alejandro Mulling notified and orders received to start a NTG gtt. Orders placed and gtt started at 1.5cc/hr. Pt educated and will cont to monitor.

## 2014-10-14 ENCOUNTER — Encounter (HOSPITAL_COMMUNITY): Payer: Self-pay | Admitting: Physician Assistant

## 2014-10-14 DIAGNOSIS — I2510 Atherosclerotic heart disease of native coronary artery without angina pectoris: Secondary | ICD-10-CM

## 2014-10-14 LAB — COMPREHENSIVE METABOLIC PANEL
ALBUMIN: 3.1 g/dL — AB (ref 3.5–5.2)
ALT: 16 U/L (ref 0–53)
AST: 37 U/L (ref 0–37)
Alkaline Phosphatase: 33 U/L — ABNORMAL LOW (ref 39–117)
Anion gap: 14 (ref 5–15)
BILIRUBIN TOTAL: 0.6 mg/dL (ref 0.3–1.2)
BUN: 8 mg/dL (ref 6–23)
CHLORIDE: 105 meq/L (ref 96–112)
CO2: 23 meq/L (ref 19–32)
CREATININE: 1 mg/dL (ref 0.50–1.35)
Calcium: 8.8 mg/dL (ref 8.4–10.5)
GFR calc Af Amer: 85 mL/min — ABNORMAL LOW (ref >90)
GFR, EST NON AFRICAN AMERICAN: 73 mL/min — AB (ref >90)
Glucose, Bld: 109 mg/dL — ABNORMAL HIGH (ref 70–99)
POTASSIUM: 3.4 meq/L — AB (ref 3.7–5.3)
Sodium: 142 mEq/L (ref 137–147)
Total Protein: 6.5 g/dL (ref 6.0–8.3)

## 2014-10-14 LAB — CBC
HCT: 36.5 % — ABNORMAL LOW (ref 39.0–52.0)
Hemoglobin: 12.6 g/dL — ABNORMAL LOW (ref 13.0–17.0)
MCH: 32.4 pg (ref 26.0–34.0)
MCHC: 34.5 g/dL (ref 30.0–36.0)
MCV: 93.8 fL (ref 78.0–100.0)
PLATELETS: 185 10*3/uL (ref 150–400)
RBC: 3.89 MIL/uL — ABNORMAL LOW (ref 4.22–5.81)
RDW: 13.1 % (ref 11.5–15.5)
WBC: 6.4 10*3/uL (ref 4.0–10.5)

## 2014-10-14 LAB — GLUCOSE, CAPILLARY: GLUCOSE-CAPILLARY: 116 mg/dL — AB (ref 70–99)

## 2014-10-14 MED ORDER — PRASUGREL HCL 10 MG PO TABS: 10.0000 mg | ORAL_TABLET | Freq: Every day | ORAL | Status: DC

## 2014-10-14 MED ORDER — ISOSORBIDE MONONITRATE ER 30 MG PO TB24: 30.0000 mg | ORAL_TABLET | Freq: Every day | ORAL | Status: DC

## 2014-10-14 MED FILL — Sodium Chloride IV Soln 0.9%: INTRAVENOUS | Qty: 50 | Status: AC

## 2014-10-14 NOTE — Progress Notes (Signed)
Discharge Summary   Patient ID: [name] Cook,  MRN: [id number], DOB/AGE: January 18, 1941 73 y.o.  Admit date: 10/12/2014 Discharge date: 10/14/2014  Primary Care Provider: [facility] Primary Cardiologist: Dr. Percival Spanish  Discharge Diagnoses Principal Problem:   NSTEMI (non-ST elevated myocardial infarction)  - DES to SVG to OM Active Problems:   Dyslipidemia   Essential hypertension   Coronary atherosclerosis   Peripheral vascular disease   Diabetes mellitus with circulatory complication   Atherosclerosis of native arteries of extremity with intermittent claudication   Allergies Allergies  Allergen Reactions  . Altace [Ramipril] Other (See Comments)    hypotension    Procedures  Cardiac catheterization 10/13/2014 PCI Data: Vessel - SVG to OM/Segment - mid Percent Stenosis (pre) 90-95% TIMI-flow 2 Stent 3.5 x 18 mm Xience Alpine Percent Stenosis (post) 0% TIMI-flow (post) 3  Final Conclusions:  1. Severe 3 vessel obstructive CAD 2. Chronic occlusion of SVG to RCA and SVG to ramus intermediate. 3. Restenosis in-stent in SVG to OM 4. Patent LIMA to LAD 5. Mild LV dysfunction with inferior hypokinesis. 6. Moderate MR. 7. Successful stenting of the SVG to OM with DES.   Recommendations:  Continue DAPT indefinitely with 2 layers of stent in old SVG. Anticipate DC in am.    Hospital Course  The patient is a 73 year old male with past medical history of coronary artery disease, right BKA, hypertension, hyperlipidemia, diabetes, GERD, history of peptic ulcer disease. He underwent cardiac catheterization in 2014 and received a drug-eluting stent to SVG to RCA. Patient presented to [facility] on 10/12/2014 with chest pain and elevated troponin and ruled in for NSTEMI. He underwent cardiac catheterization on 10/13/2014 which showed EF 45-50%, 80-90% long proximal LAD stenosis, LAD occluded midvessel, 30% ost Ramus, LCX subtotal obcclusion in prox vessel and  total occlusion midvessel, 50-60% stenosis in mid RCA with subtotal occlusion in mid to distal vessel. Occluded SVG to RCA, occluded SVG to ramus, patent LIMA to LAD, SVG to OM had 90-95% in-stent restenosis treated with DES. Given the fact he has 2 overlapping drug-eluting stent in SVG to OM, patient will need lifelong dual antiplatelet therapy.  He was seen in the morning of 10/14/2014. His blood pressure has been hypertensive throughout night. I have restarted his hydrochlorothiazide. We have also added Imdur will to his medical regimen given the degree of coronary artery disease. I will defer to Dr. Percival Spanish whether or not to restart his lisinopril if his blood pressure is stable on follow-up. I have sent a staff message to our scheduler to schedule follow-up with Dr. Percival Spanish in 2-4 weeks. Patient has been advised to hold metformin 48 hours after cardiac catheterization.   Discharge Vitals Blood pressure 146/52, pulse 66, temperature 97.5 F (36.4 C), temperature source Oral, resp. rate 18, height 5\' 8"  (1.727 m), weight 202 lb 6.1 oz (91.8 kg), SpO2 97 %.  Filed Weights   10/12/14 2122 10/13/14 0540 10/14/14 0013  Weight: 200 lb 12.8 oz (91.082 kg) 200 lb (90.719 kg) 202 lb 6.1 oz (91.8 kg)    Labs  CBC  Recent Labs  10/13/14 0346 10/14/14 0344  WBC 5.8 6.4  HGB 13.0 12.6*  HCT 38.1* 36.5*  MCV 93.8 93.8  PLT 206 416   Basic Metabolic Panel  Recent Labs  10/12/14 1455 10/14/14 0344  NA 140 142  K 3.7 3.4*  CL 100 105  CO2 24 23  GLUCOSE 112* 109*  BUN 13 8  CREATININE 1.00 1.00  CALCIUM 8.9 8.8  Liver Function Tests  Recent Labs  10/14/14 0344  AST 37  ALT 16  ALKPHOS 33*  BILITOT 0.6  PROT 6.5  ALBUMIN 3.1*   Cardiac Enzymes  Recent Labs  10/12/14 1455  TROPONINI 1.62*   Hemoglobin A1C  Recent Labs  10/12/14 2254  HGBA1C 6.1*   Thyroid Function Tests  Recent Labs  10/12/14 2254  TSH 3.270    Disposition  Pt is being discharged home  today in good condition.  Follow-up Plans & Appointments      Follow-up Information    Follow up with Minus Breeding, MD.   Specialty:  Cardiology   Why:  Office will call you to schedule followup. Please give [place] a call if you do not hear from [place] in 2 days   Contact information:   [address] [place] [address] [phone]       Discharge Medications    Medication List    STOP taking these medications        lisinopril 10 MG tablet  Commonly known as:  PRINIVIL,ZESTRIL      TAKE these medications        acetaminophen 500 MG tablet  Commonly known as:  TYLENOL  Take 500 mg by mouth every 6 (six) hours as needed for pain.     amLODipine 5 MG tablet  Commonly known as:  NORVASC  Take 1 tablet (5 mg total) by mouth daily.     aspirin 81 MG tablet  Take 81 mg by mouth daily.     atorvastatin 40 MG tablet  Commonly known as:  LIPITOR  Take 1 tablet (40 mg total) by mouth daily.     BENEFIBER Powd  Take by mouth daily.     CALCIUM-CARB 600 + D 600-125 MG-UNIT Tabs  Generic drug:  Calcium Carbonate-Vitamin D  Take 1 tablet by mouth daily.     finasteride 5 MG tablet  Commonly known as:  PROSCAR  Take 5 mg by mouth daily.     hydrochlorothiazide 25 MG tablet  Commonly known as:  HYDRODIURIL  Take 1 tablet (25 mg total) by mouth daily.     isosorbide mononitrate 30 MG 24 hr tablet  Commonly known as:  IMDUR  Take 1 tablet (30 mg total) by mouth daily.     metFORMIN 500 MG tablet  Commonly known as:  GLUCOPHAGE  Take 1 tablet by mouth  daily     metoprolol succinate 50 MG 24 hr tablet  Commonly known as:  TOPROL-XL  Take 1 tablet (50 mg total) by mouth daily. Take with or immediately following a meal.     multivitamin tablet  Take 1 tablet by mouth daily.     nitroGLYCERIN 0.4 MG SL tablet  Commonly known as:  NITROSTAT  Place 1 tablet (0.4 mg total) under the tongue every 5 (five) minutes as needed for chest pain.     NON  FORMULARY  Take 10-15 mLs by mouth as needed (for sleep). z quill at bedtime     omeprazole 20 MG capsule  Commonly known as:  PRILOSEC  Take 1 capsule (20 mg total) by mouth daily.     prasugrel 10 MG Tabs tablet  Commonly known as:  EFFIENT  Take 1 tablet (10 mg total) by mouth daily.     ranitidine 75 MG tablet  Commonly known as:  ZANTAC  Take 75 mg by mouth at bedtime.     RESTORA Caps  Take 1  capsule by mouth daily.     tamsulosin 0.4 MG Caps capsule  Commonly known as:  FLOMAX  Take 0.8 mg by mouth.         Duration of Discharge Encounter   Greater than 30 minutes including physician time.  Hilbert Corrigan PA-C Pager: [phone] 10/14/2014, 11:57 AM

## 2014-10-14 NOTE — Progress Notes (Signed)
Patient Name: [name] Cook Date of Encounter: 10/14/2014     Principal Problem:   NSTEMI (non-ST elevated myocardial infarction) Active Problems:   Dyslipidemia   Essential hypertension   Coronary atherosclerosis   Peripheral vascular disease   Diabetes mellitus with circulatory complication   Atherosclerosis of native arteries of extremity with intermittent claudication    SUBJECTIVE  Denies any CP or SOB  CURRENT MEDS . amLODipine  5 mg Oral Daily  . aspirin  81 mg Oral Daily  . aspirin EC  81 mg Oral Daily  . atorvastatin  80 mg Oral q1800  . calcium-vitamin D  1 tablet Oral Q breakfast  . finasteride  5 mg Oral Daily  . metoprolol succinate  50 mg Oral Daily  . pantoprazole  40 mg Oral Daily  . prasugrel  10 mg Oral Daily  . tamsulosin  0.8 mg Oral QPC breakfast    OBJECTIVE  Filed Vitals:   10/13/14 1945 10/13/14 2000 10/14/14 0013 10/14/14 0348  BP: 162/60 144/53 158/54 146/60  Pulse:  70 70 66  Temp: 97.9 F (36.6 C)  99.2 F (37.3 C) 97.6 F (36.4 C)  TempSrc: Oral  Oral Oral  Resp: 18 18 18 18   Height:      Weight:   202 lb 6.1 oz (91.8 kg)   SpO2: 95% 97% 96% 96%    Intake/Output Summary (Last 24 hours) at 10/14/14 0705 Last data filed at 10/14/14 0351  Gross per 24 hour  Intake 1198.46 ml  Output    600 ml  Net 598.46 ml   Filed Weights   10/12/14 2122 10/13/14 0540 10/14/14 0013  Weight: 200 lb 12.8 oz (91.082 kg) 200 lb (90.719 kg) 202 lb 6.1 oz (91.8 kg)    PHYSICAL EXAM  General: Pleasant, NAD. Neuro: Alert and oriented X 3. Moves all extremities spontaneously. Psych: Normal affect. HEENT:  Normal  Neck: Supple without bruits or JVD. Lungs:  Resp regular and unlabored, CTA. Heart: RRR no s3, s4. 2/6 systolic murmur Abdomen: Soft, non-tender, non-distended, BS + x 4.  Extremities: No clubbing, cyanosis or edema. DP/PT/Radials 2+ and equal bilaterally.  Accessory Clinical Findings  CBC  Recent Labs  10/13/14 0346  10/14/14 0344  WBC 5.8 6.4  HGB 13.0 12.6*  HCT 38.1* 36.5*  MCV 93.8 93.8  PLT 206 159   Basic Metabolic Panel  Recent Labs  10/12/14 1455 10/14/14 0344  NA 140 142  K 3.7 3.4*  CL 100 105  CO2 24 23  GLUCOSE 112* 109*  BUN 13 8  CREATININE 1.00 1.00  CALCIUM 8.9 8.8   Liver Function Tests  Recent Labs  10/14/14 0344  AST 37  ALT 16  ALKPHOS 33*  BILITOT 0.6  PROT 6.5  ALBUMIN 3.1*   Cardiac Enzymes  Recent Labs  10/12/14 1455  TROPONINI 1.62*   Hmoglobin A1C  Recent Labs  10/12/14 2254  HGBA1C 6.1*   Thyroid Function Tests  Recent Labs  10/12/14 2254  TSH 3.270    TELE NSR with HR 60s, no significant ST-T wave changes    ECG  NSR with LBBB  Echocardiogram 07/17/2013  LV EF: 60% -  65%  ------------------------------------------------------------ Indications:   Aortic stenosis 424.1.  ------------------------------------------------------------ History:  PMH:  Murmur. Coronary artery disease. PMH: Myocardial infarction. Risk factors: Hypertension. Diabetes mellitus. Dyslipidemia.  ------------------------------------------------------------ Study Conclusions  - Left ventricle: The cavity size was normal. Wall thickness was increased in a pattern of mild LVH. Systolic function  was normal. The estimated ejection fraction was in the range of 60% to 65%. There was an increased relative contribution of atrial contraction to ventricular filling. Doppler parameters are consistent with abnormal left ventricular relaxation (grade 1 diastolic dysfunction). Doppler parameters are consistent with elevated ventricular end-diastolic filling pressure. - Aortic valve: There is mild to moderate aortic stenosis, with a maximally estimated mean gradient of 19 mmHg. Mildly thickened, mildly calcified leaflets. Mild regurgitation. Valve area: 1.2cm^2(VTI). Valve area: 1.28cm^2 (Vmax).     Radiology/Studies  Dg Chest Port 1 View  10/12/2014   CLINICAL DATA:  73 year old male with left chest pain radiating to the left upper extremity for 2 days. Initial encounter.  EXAM: PORTABLE CHEST - 1 VIEW  COMPARISON:  07/20/2013 and earlier.  FINDINGS: Portable AP upright view at 1503 hrs. Stable cardiomegaly and mediastinal contours. Sequelae of CABG. Allowing for portable technique, the lungs are clear. No pneumothorax or pleural effusion.  IMPRESSION: No acute cardiopulmonary abnormality.   Electronically Signed   By: Lars Pinks M.D.   On: 10/12/2014 15:15    ASSESSMENT AND PLAN  1. NSTEMI  - Cath 10/13/2014 EF 45-50%, 80-90% long prox LAD stenosis, LAD occluded midvessel, 30% ost Ramus, LCX subtotal obcclusion in prox vessel and total occlusion midvessel, 50-60% stenosis in mid RCA with subtotal occlusion in mid to distal vessel. Occluded SVG to RCA, occluded SVG to ramus, patent LIMA to LAD, SVG to OM had 90-95% in-stent restenosis treated with DES  - continue DAPT indefinitiely with 2 layers of stent in old SVG  - Continue ASA, Effient, long acting metoprolol. BP 130 to 170s, may need to increase amlodipine or add long acting nitrate. Discharge today  2. CAD s/p CABG w/ prior stenting of SVH to OM in 06/2013  - SVG to RCA and SVG to ramus occluded on cath 10/13/2014  - DES to SVG to OM instent restenosis, patent LIMA to LAD  - triple vessel dx of native coronary arteries  3. HTN 4. HLD 5. DM   Signed, Almyra Deforest PA-C Pager: [phone]   Patient seen and examined. Agree with assessment and plan. Day 1 s/p DES stenting to SVG to OM. Will add nitrates to medical regimen with severe native CAD and chronic occlusion of SVG to RCA and ramus. DC today.   Troy Sine, MD, [facility] 10/14/2014 9:08 AM

## 2014-10-14 NOTE — Progress Notes (Signed)
CARDIAC REHAB PHASE I   PRE:  Rate/Rhythm: 68 SR    BP: sitting 146/52    SaO2:   MODE:  Ambulation: 350 ft   POST:  Rate/Rhythm: 92 SR    BP: sitting 1365/72     SaO2:   Tolerated well, limps from prosthesis. Sts he struggles to walk for ex due to rubbing prosthesis. Encouraged him to try exercising on bike or stepper at local [employer] ([place] too far for him). Reviewed ed, esp NTG as he was not carrying it with him. [phone]   Darrick Meigs CES, ACSM 10/14/2014 9:40 AM

## 2014-10-14 NOTE — Discharge Instructions (Signed)
Acute Coronary Syndrome  Acute coronary syndrome (ACS) is an urgent problem in which the blood and oxygen supply to the heart is critically deficient. ACS requires hospitalization because one or more coronary arteries may be blocked.  ACS represents a range of conditions including:  · Previous angina that is now unstable, lasts longer, happens at rest, or is more intense.  · A heart attack, with heart muscle cell injury and death.  There are three vital coronary arteries that supply the heart muscle with blood and oxygen so that it can pump blood effectively. If blockages to these arteries develop, blood flow to the heart muscle is reduced. If the heart does not get enough blood, angina may occur as the first warning sign.  SYMPTOMS   · The most common signs of angina include:  ¨ Tightness or squeezing in the chest.  ¨ Feeling of heaviness on the chest.  ¨ Discomfort in the arms, neck, back, or jaw.  ¨ Shortness of breath and nausea.  ¨ Cold, wet skin.  · Angina is usually brought on by physical effort or excitement which increase the oxygen needs of the heart. These states increase the blood flow needs of the heart beyond what can be delivered.  · Other symptoms that are not as common include:  ¨ Fatigue  ¨ Unexplained feelings of nervousness or anxiety  ¨ Weakness  ¨ Diarrhea  · Sometimes, you may not have noticed any symptoms at all but still suffered a cardiac injury.  TREATMENT   · Medicines to help discomfort may include nitroglycerin (nitro) in the form of tablets or a spray for rapid relief, or longer-acting forms such as cream, patches, or capsules. (Be aware that there are many side effects and possible interactions with other drugs).  · Other medicines may be used to help the heart pump better.  · Procedures to open blocked arteries including angioplasty or stent placement to keep the arteries open.  · Open heart surgery may be needed when there are many blockages or they are in critical locations that  are best treated with surgery.  HOME CARE INSTRUCTIONS   · Do not use any tobacco products including cigarettes, chewing tobacco, or electronic cigarettes.  · Take one baby or adult aspirin daily, if your health care provider advises. This helps reduce the risk of a heart attack.  · It is very important that you follow the angina treatment prescribed by your health care provider. Make arrangements for proper follow-up care.  · Eat a heart healthy diet with salt and fat restrictions as advised.  · Regular exercise is good for you as long as it does not cause discomfort. Do not begin any new type of exercise until you check with your health care provider.  · If you are overweight, you should lose weight.  · Try to maintain normal blood lipid levels.  · Keep your blood pressure under control as recommended by your health care provider.  · You should tell your health care provider right away about any increase in the severity or frequency of your chest discomfort or angina attacks. When you have angina, you should stop what you are doing and sit down. This may bring relief in 3 to 5 minutes. If your health care provider has prescribed nitro, take it as directed.  · If your health care provider has given you a follow-up appointment, it is very important to keep that appointment. Not keeping the appointment could result in a chronic or   of breath. °· You feel faint, lightheaded, or pass out. °· Your chest discomfort gets worse. °· You are sweating or experience sudden profound fatigue. °· You do not get relief of your chest pain after 3 doses of nitro. °· Your discomfort lasts longer than 15 minutes. °MAKE SURE YOU:  °· Understand these instructions. °· Will watch your condition. °· Will get help right  away if you are not doing well or get worse. °· Take all medicines as directed by your health care provider. °Document Released: 11/11/2005 Document Revised: 11/16/2013 Document Reviewed: 03/15/2014 °Exit[employer]® Patient Information ©2015 [employer]. This information is not intended to replace advice given to you by your health care provider. Make sure you discuss any questions you have with your health care provider. ° °No driving for 24hours. No lifting over 5 lbs for 1 week. No sexual activity for 1 week. Keep procedure site clean & dry. If you notice increased pain, swelling, bleeding or pus, call/return!  You may shower, but no soaking baths/hot tubs/pools for 1 week.  ° ° °

## 2014-10-14 NOTE — Progress Notes (Signed)
Pharmacist Heart Failure Core Measure Documentation  Assessment: Sean Malinowski has an EF documented as 45-50% on 10/13/14 by cath.  Rationale: Heart failure patients with left ventricular systolic dysfunction (LVSD) and an EF < 40% should be prescribed an angiotensin converting enzyme inhibitor (ACEI) or angiotensin receptor blocker (ARB) at discharge unless a contraindication is documented in the medical record.  This patient is not currently on an ACEI or ARB for HF.  This note is being placed in the record in order to provide documentation that a contraindication to the use of these agents is present for this encounter.  ACE Inhibitor or Angiotensin Receptor Blocker is contraindicated (specify all that apply)  [x]   ACEI allergy AND ARB allergy []   Angioedema []   Moderate or severe aortic stenosis []   Hyperkalemia []   Hypotension []   Renal artery stenosis []   Worsening renal function, preexisting renal disease or dysfunction   Blessen Kimbrough, Tsz-Yin 10/14/2014 10:19 AM

## 2014-10-18 NOTE — Discharge Summary (Signed)
Discharge Summary   Patient ID: [name] Cook,  MRN: [id number], DOB/AGE: 72-27-1942 72 y.o.  Admit date: 10/12/2014 Discharge date: 10/14/2014  Primary Care Provider: [facility] Primary Cardiologist: Dr. Percival Spanish  Discharge Diagnoses Principal Problem:  NSTEMI (non-ST elevated myocardial infarction) - DES to SVG to OM Active Problems:  Dyslipidemia  Essential hypertension  Coronary atherosclerosis  Peripheral vascular disease  Diabetes mellitus with circulatory complication  Atherosclerosis of native arteries of extremity with intermittent claudication   Allergies Allergies  Allergen Reactions  . Altace [Ramipril] Other (See Comments)    hypotension    Procedures  Cardiac catheterization 10/13/2014 PCI Data: Vessel - SVG to OM/Segment - mid Percent Stenosis (pre) 90-95% TIMI-flow 2 Stent 3.5 x 18 mm Xience Alpine Percent Stenosis (post) 0% TIMI-flow (post) 3  Final Conclusions:  1. Severe 3 vessel obstructive CAD 2. Chronic occlusion of SVG to RCA and SVG to ramus intermediate. 3. Restenosis in-stent in SVG to OM 4. Patent LIMA to LAD 5. Mild LV dysfunction with inferior hypokinesis. 6. Moderate MR. 7. Successful stenting of the SVG to OM with DES.   Recommendations:  Continue DAPT indefinitely with 2 layers of stent in old SVG. Anticipate DC in am.    Hospital Course  The patient is a 73 year old male with past medical history of coronary artery disease, right BKA, hypertension, hyperlipidemia, diabetes, GERD, history of peptic ulcer disease. He underwent cardiac catheterization in 2014 and received a drug-eluting stent to SVG to RCA. Patient presented to [facility] on 10/12/2014 with chest pain and elevated troponin and ruled in for NSTEMI. He underwent cardiac catheterization on 10/13/2014 which showed EF 45-50%, 80-90% long proximal LAD stenosis, LAD occluded midvessel, 30% ost Ramus, LCX subtotal  obcclusion in prox vessel and total occlusion midvessel, 50-60% stenosis in mid RCA with subtotal occlusion in mid to distal vessel. Occluded SVG to RCA, occluded SVG to ramus, patent LIMA to LAD, SVG to OM had 90-95% in-stent restenosis treated with DES. Given the fact he has 2 overlapping drug-eluting stent in SVG to OM, patient will need lifelong dual antiplatelet therapy.  He was seen in the morning of 10/14/2014. His blood pressure has been hypertensive throughout night. I have restarted his hydrochlorothiazide. We have also added Imdur will to his medical regimen given the degree of coronary artery disease. I will defer to Dr. Percival Spanish whether or not to restart his lisinopril if his blood pressure is stable on follow-up. I have sent a staff message to our scheduler to schedule follow-up with Dr. Percival Spanish in 2-4 weeks. Patient has been advised to hold metformin 48 hours after cardiac catheterization.   Discharge Vitals Blood pressure 146/52, pulse 66, temperature 97.5 F (36.4 C), temperature source Oral, resp. rate 18, height 5\' 8"  (1.727 m), weight 202 lb 6.1 oz (91.8 kg), SpO2 97 %.  Filed Weights   10/12/14 2122 10/13/14 0540 10/14/14 0013  Weight: 200 lb 12.8 oz (91.082 kg) 200 lb (90.719 kg) 202 lb 6.1 oz (91.8 kg)    Labs  CBC  Recent Labs (last 2 labs)      Recent Labs  10/13/14 0346 10/14/14 0344  WBC 5.8 6.4  HGB 13.0 12.6*  HCT 38.1* 36.5*  MCV 93.8 93.8  PLT 206 185     Basic Metabolic Panel  Recent Labs (last 2 labs)      Recent Labs  10/12/14 1455 10/14/14 0344  NA 140 142  K 3.7 3.4*  CL 100 105  CO2 24 23  GLUCOSE  112* 109*  BUN 13 8  CREATININE 1.00 1.00  CALCIUM 8.9 8.8     Liver Function Tests  Recent Labs (last 2 labs)      Recent Labs  10/14/14 0344  AST 37  ALT 16  ALKPHOS 33*  BILITOT 0.6  PROT 6.5  ALBUMIN 3.1*     Cardiac Enzymes  Recent Labs (last 2 labs)        Recent Labs  10/12/14 1455  TROPONINI 1.62*     Hemoglobin A1C  Recent Labs (last 2 labs)      Recent Labs  10/12/14 2254  HGBA1C 6.1*     Thyroid Function Tests  Recent Labs (last 2 labs)      Recent Labs  10/12/14 2254  TSH 3.270      Disposition  Pt is being discharged home today in good condition.  Follow-up Plans & Appointments      Follow-up Information    Follow up with Minus Breeding, MD.   Specialty: Cardiology   Why: Office will call you to schedule followup. Please give [place] a call if you do not hear from [place] in 2 days   Contact information:   [address] [place] [address] [phone]       Discharge Medications    Medication List    STOP taking these medications       lisinopril 10 MG tablet  Commonly known as: PRINIVIL,ZESTRIL      TAKE these medications       acetaminophen 500 MG tablet  Commonly known as: TYLENOL  Take 500 mg by mouth every 6 (six) hours as needed for pain.     amLODipine 5 MG tablet  Commonly known as: NORVASC  Take 1 tablet (5 mg total) by mouth daily.     aspirin 81 MG tablet  Take 81 mg by mouth daily.     atorvastatin 40 MG tablet  Commonly known as: LIPITOR  Take 1 tablet (40 mg total) by mouth daily.     BENEFIBER Powd  Take by mouth daily.     CALCIUM-CARB 600 + D 600-125 MG-UNIT Tabs  Generic drug: Calcium Carbonate-Vitamin D  Take 1 tablet by mouth daily.     finasteride 5 MG tablet  Commonly known as: PROSCAR  Take 5 mg by mouth daily.     hydrochlorothiazide 25 MG tablet  Commonly known as: HYDRODIURIL  Take 1 tablet (25 mg total) by mouth daily.     isosorbide mononitrate 30 MG 24 hr tablet  Commonly known as: IMDUR  Take 1 tablet (30 mg total) by mouth daily.     metFORMIN 500 MG tablet  Commonly known as: GLUCOPHAGE  Take 1 tablet by mouth daily      metoprolol succinate 50 MG 24 hr tablet  Commonly known as: TOPROL-XL  Take 1 tablet (50 mg total) by mouth daily. Take with or immediately following a meal.     multivitamin tablet  Take 1 tablet by mouth daily.     nitroGLYCERIN 0.4 MG SL tablet  Commonly known as: NITROSTAT  Place 1 tablet (0.4 mg total) under the tongue every 5 (five) minutes as needed for chest pain.     NON FORMULARY  Take 10-15 mLs by mouth as needed (for sleep). z quill at bedtime     omeprazole 20 MG capsule  Commonly known as: PRILOSEC  Take 1 capsule (20 mg total) by mouth daily.     prasugrel 10  MG Tabs tablet  Commonly known as: EFFIENT  Take 1 tablet (10 mg total) by mouth daily.     ranitidine 75 MG tablet  Commonly known as: ZANTAC  Take 75 mg by mouth at bedtime.     RESTORA Caps  Take 1 capsule by mouth daily.     tamsulosin 0.4 MG Caps capsule  Commonly known as: FLOMAX  Take 0.8 mg by mouth.         Duration of Discharge Encounter   Greater than 30 minutes including physician time.  Hilbert Corrigan PA-C Pager: [phone] 10/14/2014, 11:57 AM  Addendum: this is correction of previous discharge summary which was erroneously logged as progress note.

## 2014-10-24 ENCOUNTER — Telehealth: Payer: Self-pay | Admitting: Internal Medicine

## 2014-10-24 NOTE — Telephone Encounter (Signed)
Pt was recently in hospital and had a cardiac cath. Pt is now on blood thinner. Pt states he has not called cardiology. Instructed pt to call cardiology due to blood thinners, medication may need to be adjusted. Instructed pt to call us back if cardiology thinks GI needs to be involved, he verbalized understanding.

## 2014-11-03 ENCOUNTER — Encounter (HOSPITAL_COMMUNITY): Payer: Self-pay | Admitting: Cardiology

## 2014-11-16 ENCOUNTER — Encounter: Payer: Self-pay | Admitting: Cardiology

## 2014-11-16 ENCOUNTER — Ambulatory Visit (INDEPENDENT_AMBULATORY_CARE_PROVIDER_SITE_OTHER): Payer: Medicare Other | Admitting: Cardiology

## 2014-11-16 VITALS — BP 140/70 | HR 64 | Ht 68.0 in | Wt 201.0 lb

## 2014-11-16 DIAGNOSIS — I2510 Atherosclerotic heart disease of native coronary artery without angina pectoris: Secondary | ICD-10-CM

## 2014-11-16 DIAGNOSIS — Z789 Other specified health status: Secondary | ICD-10-CM | POA: Diagnosis not present

## 2014-11-16 DIAGNOSIS — I10 Essential (primary) hypertension: Secondary | ICD-10-CM

## 2014-11-16 NOTE — Patient Instructions (Signed)
Your physician recommends that you schedule a follow-up appointment in: march 2016 and we will do an echo and your Carotids the same day

## 2014-11-16 NOTE — Progress Notes (Signed)
HPI The patient presents for followup of known coronary disease. The patient presented to [facility] on 10/12/2014 with chest pain and elevated troponin and ruled in for NSTEMI. He underwent cardiac catheterization on 10/13/2014 which showed EF 45-50%, 80-90% long proximal LAD stenosis, LAD occluded midvessel, 30% ost Ramus, LCX subtotal obcclusion in prox vessel and total occlusion midvessel, 50-60% stenosis in mid RCA with subtotal occlusion in mid to distal vessel. Occluded SVG to RCA, occluded SVG to ramus, patent LIMA to LAD, SVG to OM had 90-95% in-stent restenosis treated with DES. Given the fact he has 2 overlapping drug-eluting stent in SVG to OM, patient will need lifelong dual antiplatelet therapy.  Since discharge he has done well.  The patient denies any new symptoms such as chest discomfort, neck or arm discomfort. There has been no new shortness of breath, PND or orthopnea. There have been no reported palpitations, presyncope or syncope.  He is doing stuff around the house although he does not exercise.    Hospital records were reviewedfor this visit.   Allergies  Allergen Reactions  . Altace [Ramipril] Other (See Comments)    hypotension    Current Outpatient Prescriptions  Medication Sig Dispense Refill  . acetaminophen (TYLENOL) 500 MG tablet Take 500 mg by mouth every 6 (six) hours as needed for pain.    Marland Kitchen amLODipine (NORVASC) 5 MG tablet Take 1 tablet (5 mg total) by mouth daily. 90 tablet 1  . aspirin 81 MG tablet Take 81 mg by mouth daily.      Marland Kitchen atorvastatin (LIPITOR) 40 MG tablet Take 1 tablet (40 mg total) by mouth daily. 90 tablet 3  . Calcium Carbonate-Vitamin D (CALCIUM-CARB 600 + D) 600-125 MG-UNIT TABS Take 1 tablet by mouth daily.      . finasteride (PROSCAR) 5 MG tablet Take 5 mg by mouth daily.    . hydrochlorothiazide (HYDRODIURIL) 25 MG tablet Take 1 tablet (25 mg total) by mouth daily. 90 tablet 3  . isosorbide mononitrate (IMDUR) 30 MG 24 hr  tablet Take 1 tablet (30 mg total) by mouth daily. 30 tablet 6  . metFORMIN (GLUCOPHAGE) 500 MG tablet Take 1 tablet by mouth  daily 30 tablet 2  . metoprolol succinate (TOPROL-XL) 50 MG 24 hr tablet Take 1 tablet (50 mg total) by mouth daily. Take with or immediately following a meal. 90 tablet 3  . Multiple Vitamin (MULTIVITAMIN) tablet Take 1 tablet by mouth daily.      . nitroGLYCERIN (NITROSTAT) 0.4 MG SL tablet Place 1 tablet (0.4 mg total) under the tongue every 5 (five) minutes as needed for chest pain. 25 tablet 3  . NON FORMULARY Take 10-15 mLs by mouth as needed (for sleep). z quill at bedtime    . omeprazole (PRILOSEC) 20 MG capsule Take 1 capsule (20 mg total) by mouth daily. 30 capsule 6  . prasugrel (EFFIENT) 10 MG TABS tablet Take 1 tablet (10 mg total) by mouth daily. 30 tablet 11  . Probiotic Product (RESTORA) CAPS Take 1 capsule by mouth daily. 30 capsule 6  . ranitidine (ZANTAC) 75 MG tablet Take 75 mg by mouth at bedtime.     . tamsulosin (FLOMAX) 0.4 MG CAPS Take 0.8 mg by mouth.     . Wheat Dextrin (BENEFIBER) POWD Take by mouth daily.     No current facility-administered medications for this visit.    Past Medical History  Diagnosis Date  . CAD (coronary artery disease)  2014 75% left main stenosis, LAD 80% followed by a mid subtotal stenosis, circumflex 90% stenosis, right coronary artery occluded, SVG to RCA occluded, SVG ramus occluded, SVG to OM patent without a sequential anastomosis noted. Graft had a focal 99% stenosis which was stented. 3.5 x 16 mm premier. b. cath 10/13/2014 DES to SVG to OM overlap previous stent  . 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  . Necrotizing fasciitis   . Cogan syndrome   . Hypertension   . Peptic ulcer disease   . Dyslipidemia   . Diabetes mellitus without complication   . Arthritis   . Gallstones   . GERD (gastroesophageal reflux disease)   . Kidney stones   . Colon polyps   . Hepatic hemangioma 2014  .  Cholelithiasis 2014    Past Surgical History  Procedure Laterality Date  . 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 Coronary artery bypass graft  1999    LIMA to the LAD, saphenous vein graft to obtuse marginal 1 and obtuse marginal 2, spahenous vein graft to intermediate, spahenous vein graft to the right coronary artery in 1999  . Appendectomy  2007  . Left heart catheterization with coronary/graft angiogram N/A 07/19/2013    Procedure: LEFT HEART CATHETERIZATION WITH Beatrix Fetters;  Surgeon: Peter M Martinique, MD;  Location: [facility] CATH LAB;  Service: Cardiovascular;  Laterality: N/A;  . Percutaneous coronary stent intervention (pci-s)  07/19/2013    Procedure: PERCUTANEOUS CORONARY STENT INTERVENTION (PCI-S);  Surgeon: Peter M Martinique, MD;  Location: [facility] CATH LAB;  Service: Cardiovascular;;  . Left heart catheterization with coronary angiogram N/A 10/13/2014    Procedure: LEFT HEART CATHETERIZATION WITH CORONARY ANGIOGRAM;  Surgeon: Peter M Martinique, MD;  Location: [facility] CATH LAB;  Service: Cardiovascular;  Laterality: N/A;    ROS:  Cough with lying down.  Otherwise as stated in the HPI and negative for all other systems.  PHYSICAL EXAM BP 140/70 mmHg  Pulse 64  Ht 5\' 8"  (1.727 m)  Wt 201 lb (91.173 kg)  BMI 30.57 kg/m2 GENERAL:  Well appearing NECK:  No jugular venous distention, waveform within normal limits, carotid upstroke brisk and symmetric, soft bilateral carotid bruits bruits, no thyromegaly LYMPHATICS:  No cervical, inguinal adenopathy LUNGS:  Clear to auscultation bilaterally BACK:  No CVA tenderness CHEST:  Well healed sternotomy scar.   HEART:  PMI not displaced or sustained,S1 and S2 within normal limits, no S3, no S4, no clicks, no rubs, soft early peaking apical systolic murmur heard also at the right upper sternal border murmurs ABD:  Flat, positive bowel sounds normal in frequency in pitch, no bruits, no rebound, no guarding, no midline pulsatile mass, no hepatomegaly,  no splenomegaly EXT:  2 plus pulses throughout, mild left leg edema, no cyanosis no clubbing, status post amputation right leg   EKG:  Sinus rhythm, rate 64, borderline interventricular conduction delay, left axis deviation, poor anterior R wave progression, old anteroseptal MI.  11/16/2014   ASSESSMENT AND PLAN  CAD The patient has no new sypmtoms.  No further cardiovascular testing is indicated.  We will continue with aggressive risk reduction and meds as listed.  HTN His blood pressure is OK.  He will continue the meds as listed and keep a blood pressure diary.  Carotid stenosis He has left 40-69% bilateral stenosis. We will follow again in March of next year.   Dyslipidemia I will have him go to Dr. Tawanna Sat office  for labs to include a lipid profile.    Cardiomyopathy His EF was slightly low at the time of his catheterization. This was new. I am going to repeat an echocardiogram in March when he comes back to see me. For now he will remain on the meds as listed.

## 2014-11-22 DIAGNOSIS — R339 Retention of urine, unspecified: Secondary | ICD-10-CM | POA: Diagnosis not present

## 2014-11-22 DIAGNOSIS — N401 Enlarged prostate with lower urinary tract symptoms: Secondary | ICD-10-CM | POA: Diagnosis not present

## 2014-11-23 ENCOUNTER — Ambulatory Visit (INDEPENDENT_AMBULATORY_CARE_PROVIDER_SITE_OTHER): Payer: Medicare Other | Admitting: Family Medicine

## 2014-11-23 ENCOUNTER — Encounter: Payer: Self-pay | Admitting: Family Medicine

## 2014-11-23 VITALS — BP 191/76 | HR 70 | Temp 96.8°F | Ht 68.0 in | Wt 203.6 lb

## 2014-11-23 DIAGNOSIS — N400 Enlarged prostate without lower urinary tract symptoms: Secondary | ICD-10-CM | POA: Diagnosis not present

## 2014-11-23 DIAGNOSIS — E785 Hyperlipidemia, unspecified: Secondary | ICD-10-CM | POA: Diagnosis not present

## 2014-11-23 DIAGNOSIS — I2510 Atherosclerotic heart disease of native coronary artery without angina pectoris: Secondary | ICD-10-CM | POA: Diagnosis not present

## 2014-11-23 DIAGNOSIS — I10 Essential (primary) hypertension: Secondary | ICD-10-CM | POA: Diagnosis not present

## 2014-11-23 MED ORDER — METFORMIN HCL 500 MG PO TABS: ORAL_TABLET | ORAL | Status: DC

## 2014-11-23 NOTE — Progress Notes (Signed)
   Subjective:    Patient ID: [name] Cook, male    DOB: [birth year], 73 y.o.   MRN: [id number]  HPI 73 year old gentleman here for physical but he was hospitalized recently with stenting after having had CABG in 1999. He was seen by urology yesterday for problems that are unclear to me. He had a history of frequent UTIs and urinary retention but denies having enlarged prostate. Do not however he is on her scar as well as Flomax. Urologist told him he had a smooth prostate that needed to have his PSA checked today. He continues with a 4 drug regimen for blood pressure. He takes metformin 500 mg once a day but A1c is at: 6.1. During the last most recent hospitalization for stenting he had isosorbide mononitrate added to his regimen. He does not use nitroglycerin but I encouraged him to carry an up-to-date prescription around with him given his history    Review of Systems  Constitutional: Negative.   HENT: Negative.   Eyes: Negative.   Respiratory: Negative.  Negative for shortness of breath.   Cardiovascular: Negative.  Negative for chest pain and leg swelling.  Gastrointestinal: Negative.   Genitourinary: Negative.   Musculoskeletal: Negative.   Skin: Negative.   Neurological: Negative.   Psychiatric/Behavioral: Negative.   All other systems reviewed and are negative.      Objective:   Physical Exam  Constitutional: He is oriented to person, place, and time. He appears well-developed and well-nourished.  HENT:  Head: Normocephalic.  Right Ear: External ear normal.  Left Ear: External ear normal.  Nose: Nose normal.  Mouth/Throat: Oropharynx is clear and moist.  Eyes: Conjunctivae and EOM are normal. Pupils are equal, round, and reactive to light.  Neck: Normal range of motion. Neck supple.  Cardiovascular: Normal rate, regular rhythm, normal heart sounds and intact distal pulses.   Pulmonary/Chest: Effort normal and breath sounds normal.  Abdominal: Soft. Bowel sounds are  normal.  Musculoskeletal: Normal range of motion.  Neurological: He is alert and oriented to person, place, and time.  Skin: Skin is warm and dry.  Psychiatric: He has a normal mood and affect. His behavior is normal. Judgment and thought content normal.          Assessment & Plan:  1. Essential hypertension BP controlled on current regimen; no changes  2. Dyslipidemia Continue atorvastatin  Wardell Honour MD

## 2014-11-23 NOTE — Addendum Note (Signed)
Addended by: Earlene Plater on: 11/23/2014 05:32 PM   Modules accepted: Orders, SmartSet

## 2014-11-24 LAB — PSA, TOTAL AND FREE
PSA FREE PCT: 25.7 %
PSA, Free: 0.18 ng/mL
PSA: 0.7 ng/mL (ref 0.0–4.0)

## 2014-12-18 ENCOUNTER — Other Ambulatory Visit: Payer: Self-pay | Admitting: Cardiovascular Disease

## 2014-12-18 ENCOUNTER — Other Ambulatory Visit: Payer: Self-pay | Admitting: Family Medicine

## 2015-02-07 ENCOUNTER — Telehealth: Payer: Self-pay | Admitting: Cardiology

## 2015-02-07 NOTE — Telephone Encounter (Signed)
Closed encounter °

## 2015-02-08 ENCOUNTER — Encounter: Payer: Self-pay | Admitting: Cardiology

## 2015-02-08 ENCOUNTER — Ambulatory Visit (INDEPENDENT_AMBULATORY_CARE_PROVIDER_SITE_OTHER): Payer: Medicare Other | Admitting: Cardiology

## 2015-02-08 VITALS — BP 118/62 | HR 64 | Ht 68.0 in | Wt 205.0 lb

## 2015-02-08 DIAGNOSIS — I25810 Atherosclerosis of coronary artery bypass graft(s) without angina pectoris: Secondary | ICD-10-CM

## 2015-02-08 MED ORDER — NITROGLYCERIN 0.4 MG SL SUBL: 0.4000 mg | SUBLINGUAL_TABLET | SUBLINGUAL | Status: DC | PRN

## 2015-02-08 NOTE — Patient Instructions (Signed)
The current medical regimen is effective;  continue present plan and medications.  Follow up in 6 months with Dr. Percival Spanish in [place].  You will receive a letter in the mail 2 months before you are due.  Please call us when you receive this letter to schedule your follow up appointment.  Thank you for choosing [facility]!!

## 2015-02-08 NOTE — Progress Notes (Signed)
HPI The patient presents for followup of known coronary disease. The patient presented to [facility] on 10/12/2014 with chest pain and elevated troponin and ruled in for NSTEMI. He underwent cardiac catheterization on 10/13/2014 which showed EF 45-50%, 80-90% long proximal LAD stenosis, LAD occluded midvessel, 30% ost Ramus, LCX subtotal obcclusion in prox vessel and total occlusion midvessel, 50-60% stenosis in mid RCA with subtotal occlusion in mid to distal vessel. Occluded SVG to RCA, occluded SVG to ramus, patent LIMA to LAD, SVG to OM had 90-95% in-stent restenosis treated with DES. Given the fact he has 2 overlapping drug-eluting stent in SVG to OM, patient will need lifelong dual antiplatelet therapy.  Since I last saw him he has done well.   The patient denies any new symptoms such as chest discomfort, neck or arm discomfort. There has been no new shortness of breath, PND or orthopnea. There have been no reported palpitations, presyncope or syncope.  He is not exercising as much as I would like.   Allergies  Allergen Reactions  . Altace [Ramipril] Other (See Comments)    hypotension    Current Outpatient Prescriptions  Medication Sig Dispense Refill  . amLODipine (NORVASC) 5 MG tablet Take 1 tablet by mouth  daily 90 tablet 0  . aspirin 81 MG tablet Take 81 mg by mouth daily.      Marland Kitchen atorvastatin (LIPITOR) 40 MG tablet Take 1 tablet (40 mg total) by mouth daily. 90 tablet 3  . Calcium Carbonate-Vitamin D (CALCIUM-CARB 600 + D) 600-125 MG-UNIT TABS Take 1 tablet by mouth daily.      . finasteride (PROSCAR) 5 MG tablet Take 5 mg by mouth daily.    . hydrochlorothiazide (HYDRODIURIL) 25 MG tablet Take 1 tablet (25 mg total) by mouth daily. 90 tablet 3  . isosorbide mononitrate (IMDUR) 30 MG 24 hr tablet Take 1 tablet (30 mg total) by mouth daily. 30 tablet 6  . lisinopril (PRINIVIL,ZESTRIL) 10 MG tablet Take 10 mg by mouth every morning.     Marland Kitchen lisinopril (PRINIVIL,ZESTRIL) 5 MG  tablet Take 5 mg by mouth at bedtime.    . metFORMIN (GLUCOPHAGE) 500 MG tablet Take 1 tablet by mouth  daily 90 tablet 3  . metoprolol succinate (TOPROL-XL) 50 MG 24 hr tablet Take 1 tablet (50 mg total) by mouth daily. Take with or immediately following a meal. 90 tablet 3  . Multiple Vitamin (MULTIVITAMIN) tablet Take 1 tablet by mouth daily.      . NON FORMULARY Take 10-15 mLs by mouth as needed (for sleep). z quill at bedtime    . omeprazole (PRILOSEC) 20 MG capsule Take 1 capsule (20 mg total) by mouth daily. 30 capsule 6  . prasugrel (EFFIENT) 10 MG TABS tablet Take 1 tablet (10 mg total) by mouth daily. 30 tablet 11  . Probiotic Product (RESTORA) CAPS Take 1 capsule by mouth daily. 30 capsule 6  . ranitidine (ZANTAC) 75 MG tablet Take 75 mg by mouth at bedtime.     . tamsulosin (FLOMAX) 0.4 MG CAPS Take 0.8 mg by mouth.     . Wheat Dextrin (BENEFIBER) POWD Take by mouth daily.    Marland Kitchen acetaminophen (TYLENOL) 500 MG tablet Take 500 mg by mouth every 6 (six) hours as needed for pain.    . nitroGLYCERIN (NITROSTAT) 0.4 MG SL tablet Place 1 tablet (0.4 mg total) under the tongue every 5 (five) minutes as needed for chest pain. (Patient not taking: Reported on 02/08/2015) 25  tablet 3   No current facility-administered medications for this visit.    Past Medical History  Diagnosis Date  . CAD (coronary artery disease)     2014 75% left main stenosis, LAD 80% followed by a mid subtotal stenosis, circumflex 90% stenosis, right coronary artery occluded, SVG to RCA occluded, SVG ramus occluded, SVG to OM patent without a sequential anastomosis noted. Graft had a focal 99% stenosis which was stented. 3.5 x 16 mm premier. b. cath 10/13/2014 DES to SVG to OM overlap previous stent  . 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  . Necrotizing fasciitis   . Cogan syndrome   . Hypertension   . Peptic ulcer disease   . Dyslipidemia   . Diabetes mellitus without complication   . Arthritis   .  Gallstones   . GERD (gastroesophageal reflux disease)   . Kidney stones   . Colon polyps   . Hepatic hemangioma 2014  . Cholelithiasis 2014    Past Surgical History  Procedure Laterality Date  . 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 Coronary artery bypass graft  1999    LIMA to the LAD, saphenous vein graft to obtuse marginal 1 and obtuse marginal 2, spahenous vein graft to intermediate, spahenous vein graft to the right coronary artery in 1999  . Appendectomy  2007  . Left heart catheterization with coronary/graft angiogram N/A 07/19/2013    Procedure: LEFT HEART CATHETERIZATION WITH Beatrix Fetters;  Surgeon: Peter M Martinique, MD;  Location: [facility] CATH LAB;  Service: Cardiovascular;  Laterality: N/A;  . Percutaneous coronary stent intervention (pci-s)  07/19/2013    Procedure: PERCUTANEOUS CORONARY STENT INTERVENTION (PCI-S);  Surgeon: Peter M Martinique, MD;  Location: [facility] CATH LAB;  Service: Cardiovascular;;  . Left heart catheterization with coronary angiogram N/A 10/13/2014    Procedure: LEFT HEART CATHETERIZATION WITH CORONARY ANGIOGRAM;  Surgeon: Peter M Martinique, MD;  Location: [facility] CATH LAB;  Service: Cardiovascular;  Laterality: N/A;    ROS:  Cough with lying down.  Otherwise as stated in the HPI and negative for all other systems.  PHYSICAL EXAM BP 118/62 mmHg  Pulse 64  Ht 5\' 8"  (1.727 m)  Wt 205 lb (92.987 kg)  BMI 31.18 kg/m2 GENERAL:  Well appearing NECK:  No jugular venous distention, waveform within normal limits, carotid upstroke brisk and symmetric, soft bilateral carotid bruits bruits, no thyromegaly LUNGS:  Clear to auscultation bilaterally CHEST:  Well healed sternotomy scar.   HEART:  PMI not displaced or sustained,S1 and S2 within normal limits, no S3, no S4, no clicks, no rubs, soft early peaking apical systolic murmur heard also at the right upper sternal border murmurs ABD:  Flat, positive bowel sounds normal in frequency in pitch, no bruits, no rebound, no  guarding, no midline pulsatile mass, no hepatomegaly, no splenomegaly EXT:  2 plus pulses throughout, mild left leg edema, no cyanosis no clubbing, status post amputation right leg   ASSESSMENT AND PLAN  CAD The patient has no new sypmtoms.  No further cardiovascular testing is indicated.  We will continue with aggressive risk reduction and meds as listed.  HTN His blood pressure is OK.  He will continue the meds as listed .  Carotid stenosis He has left 40-69% bilateral stenosis. We will follow again with Doppler tomorrow.   Dyslipidemia I will have him go to Dr. Tawanna Sat office for labs to include a lipid profile.    Cardiomyopathy His EF  was slightly low at the time of his catheterization. This was new.   He is going to get his echo tomorrow.

## 2015-02-09 ENCOUNTER — Ambulatory Visit: Payer: Medicare Other | Admitting: Cardiology

## 2015-02-09 ENCOUNTER — Ambulatory Visit (HOSPITAL_COMMUNITY)
Admission: RE | Admit: 2015-02-09 | Discharge: 2015-02-09 | Disposition: A | Discharge status: Home / Self Care | Payer: Medicare Other | Source: Ambulatory Visit | Attending: Cardiology | Admitting: Cardiology

## 2015-02-09 ENCOUNTER — Ambulatory Visit (HOSPITAL_BASED_OUTPATIENT_CLINIC_OR_DEPARTMENT_OTHER)
Admission: RE | Admit: 2015-02-09 | Discharge: 2015-02-09 | Disposition: A | Discharge status: Home / Self Care | Payer: Medicare Other | Source: Ambulatory Visit | Attending: Cardiology | Admitting: Cardiology

## 2015-02-09 DIAGNOSIS — I779 Disorder of arteries and arterioles, unspecified: Secondary | ICD-10-CM | POA: Diagnosis not present

## 2015-02-09 DIAGNOSIS — Z789 Other specified health status: Secondary | ICD-10-CM | POA: Diagnosis not present

## 2015-02-09 DIAGNOSIS — I2510 Atherosclerotic heart disease of native coronary artery without angina pectoris: Secondary | ICD-10-CM | POA: Diagnosis not present

## 2015-02-09 DIAGNOSIS — I10 Essential (primary) hypertension: Secondary | ICD-10-CM | POA: Insufficient documentation

## 2015-02-09 DIAGNOSIS — I25810 Atherosclerosis of coronary artery bypass graft(s) without angina pectoris: Secondary | ICD-10-CM

## 2015-02-09 NOTE — Progress Notes (Signed)
2D Echo Performed 02/09/2015    [name] Cook, RCS

## 2015-02-09 NOTE — Progress Notes (Addendum)
Carotid Duplex Completed. Stable left ICA low scale of 50-69% stenosis.  Right ICA mild plaque visualized.  Oda Cogan, BS, RDMS, RVT

## 2015-03-14 IMAGING — CT CT ANGIO AOBIFEM WO/W CM
1 of 13 series · 13 of 48 positions shown, 16 images · IV contrast (Omnipaque 300)
Comparison: None.

CLINICAL DATA: Claudication

CT ANGIOGRAPHY OF ABDOMINAL AORTA WITH ILIOFEMORAL RUNOFF
TECHNIQUE: Multidetector CT imaging of the abdomen, pelvis and
lower extremities was performed using the standard protocol during
bolus administration of intravenous contrast.  Multiplanar CT image
reconstructions including MIPs were obtained to evaluate the
vascular anatomy.
Contrast: 150mL OMNIPAQUE IOHEXOL 350 MG/ML SOLN

[Series 4: cta 3.0 b31f pacs · axial · 0.86mm/px · z∈[-1326,-66]mm · 13 of 465 slices shown, 16 images]
[im 30/465  soft-tissue]
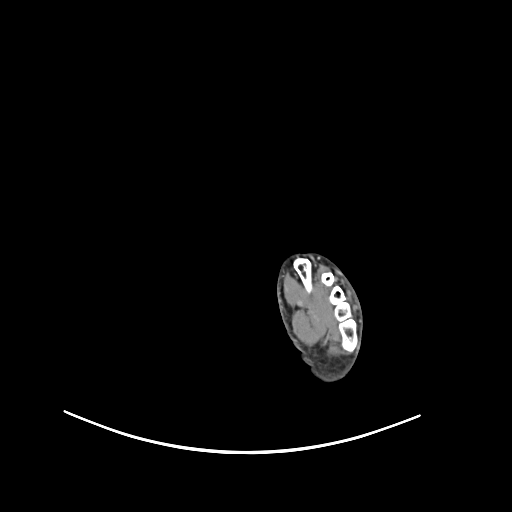
[im 30/465  bone]
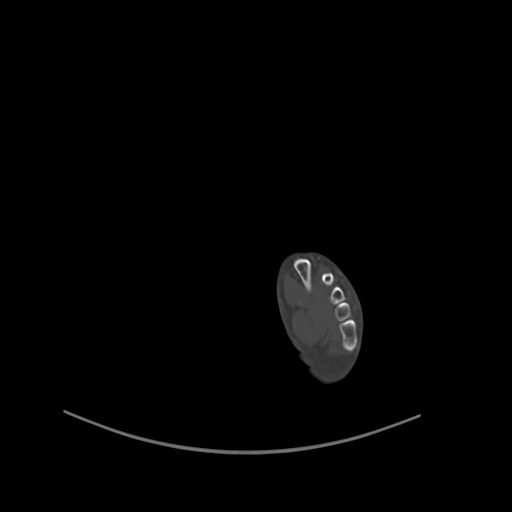
[im 75/465  soft-tissue]
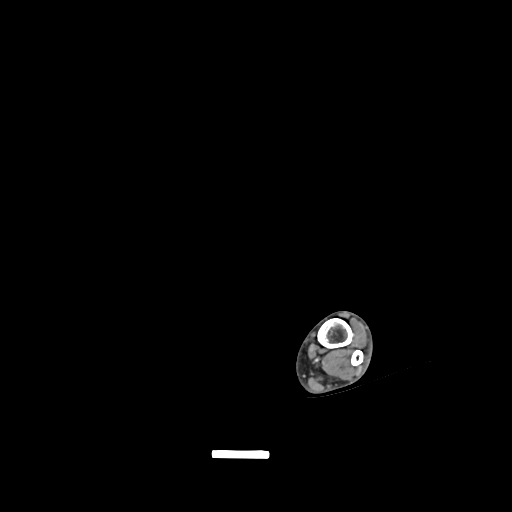
[im 120/465  soft-tissue]
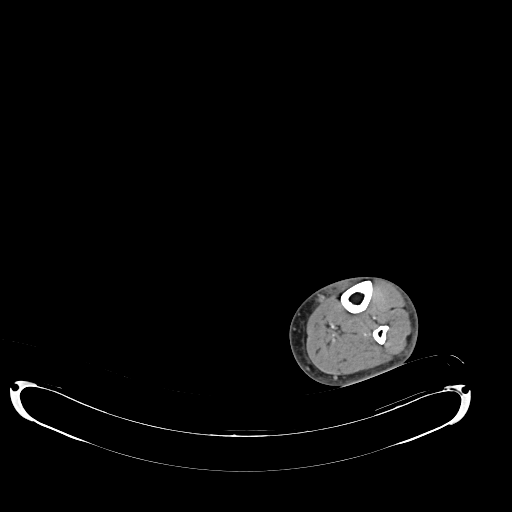
[im 165/465  soft-tissue]
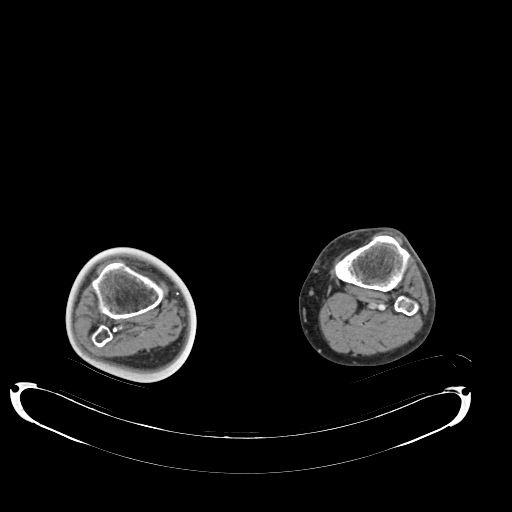
[im 210/465  soft-tissue]
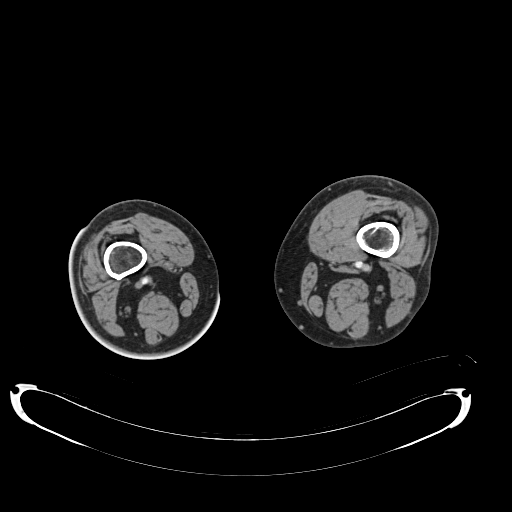
[im 255/465  soft-tissue]
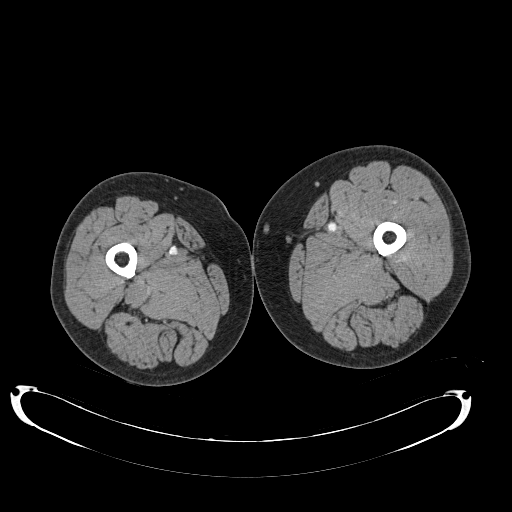
[im 300/465  soft-tissue]
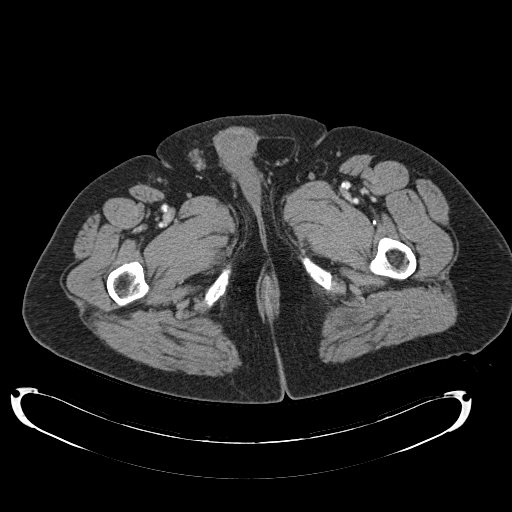
[im 345/465  soft-tissue]
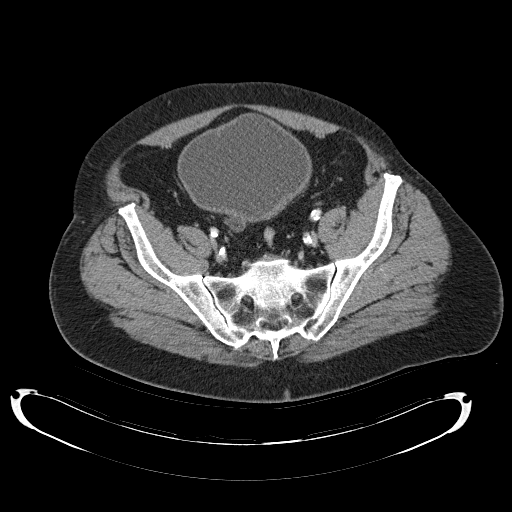
[im 390/465  soft-tissue]
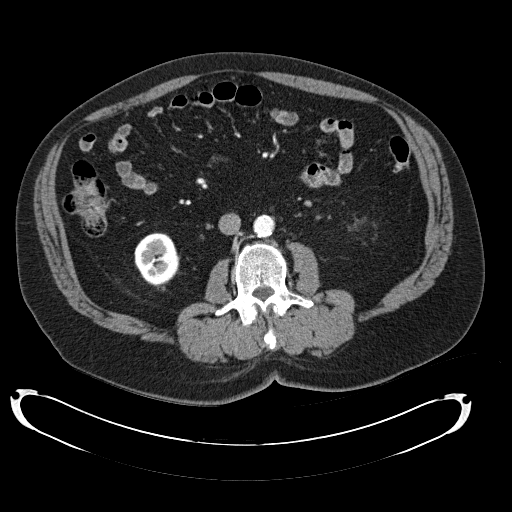
[im 390/465  bone]
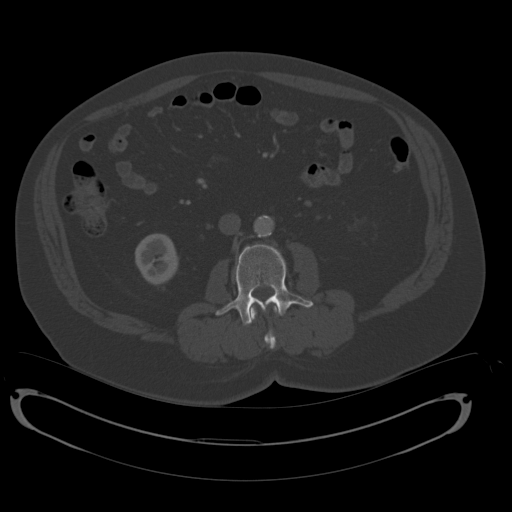
[im 405/465  lung]
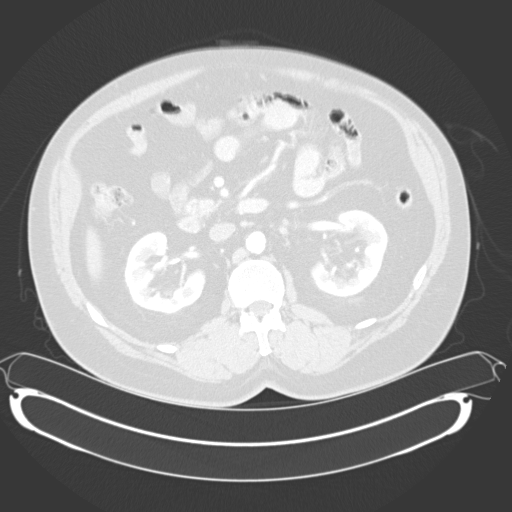
[im 420/465  lung]
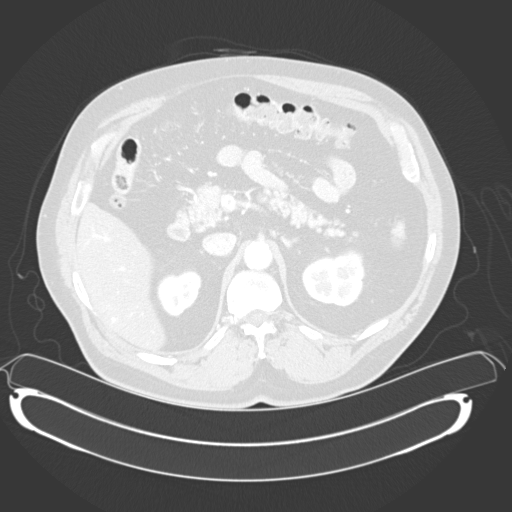
[im 435/465  soft-tissue]
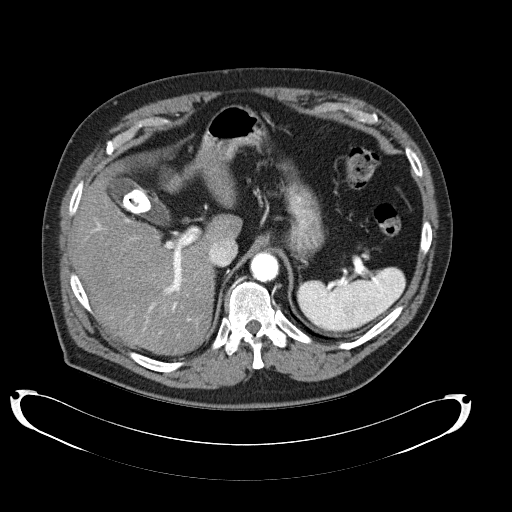
[im 435/465  lung]
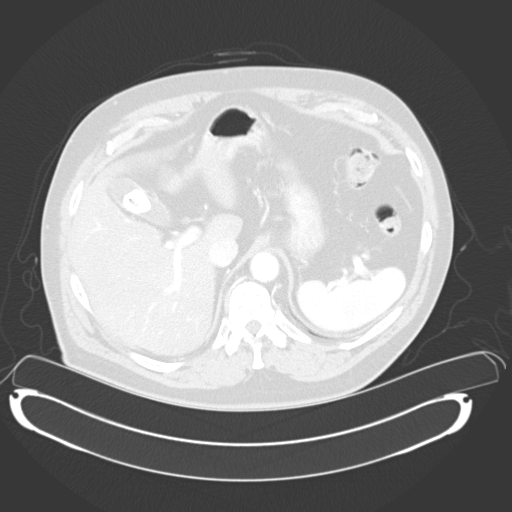
[im 450/465  lung]
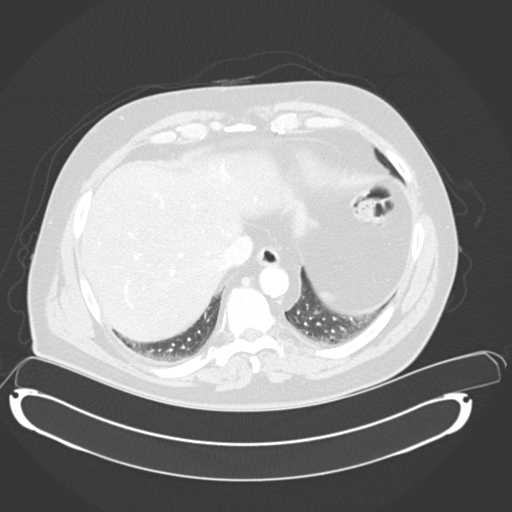

[13 of 48 positions shown; findings below may reference images not displayed]

FINDINGS: The suprarenal in juxtarenal aorta is patent and nonaneurysmal with
mild atherosclerotic changes.  There is a focal dissection of the
infrarenal aorta associated with a penetrating atherosclerotic
ulcer.  It does not appear flow limiting.

There is approximately 60% stenosis at the origin of the celiac
axis.  Branch vessels are grossly patent.

Mild atherosclerotic changes at the origin of the SMA without
significant focal narrowing.  Mild plaque in the mid SMA without
significant narrowing.  Branch vessels are grossly patent.

Moderate disease at the origin of the IMA.  It is diminutive and
grossly patent.  Branch vessels are patent.

Single renal arteries are patent.

Atherosclerotic changes at the origin of the right common iliac
artery are present.  Significant focal narrowing is suspected.  It
is a diminutive vessel with diffuse calcific plaque.  Similarly,
there is diffuse plaque in the right internal and external iliac
arteries.  No obvious focal stenosis.

Atherosclerotic changes of the left common iliac artery without
significant focal narrowing.  Diffuse atherosclerotic
calcifications of the left internal and external iliac arteries
without focal narrowing.

He centric calcified plaque is present in the right common femoral
artery.  There is significant narrowing at the origin of the right
superficial femoral artery.  The profunda femoral artery is patent.
Mild diffuse disease of the superficial femoral artery.  Popliteal
artery is patent.  Below-the-knee amputation noted.

Common femoral artery is patent with atherosclerotic
calcifications.  Mild disease at the origin of the left superficial
femoral artery.  Mild diffuse disease.  Left popliteal artery is
patent.  Tibial vessels are mildly diffusely diseased and patent to
the distal leg.  Poor filling of the anterior tibial artery and
peroneal artery likely represents phase of contrast.  The posterior
tibial artery is patent to the ankle.

Mild dependent atelectasis bilaterally.

Diffuse hepatic steatosis.  Indeterminate 1.6 cm lesion in the
lateral segment of the left lobe of the liver on image 21.  Benign
appearing cyst is present in the right lobe on image 54.  There are
several foci of contrast enhancement within the arterial phase of
the liver which are nonspecific.

Large lamellated 2.6 cm gallstone.

Spleen, pancreas, adrenal glands are within normal limits.  Chronic
changes are present in the kidneys.  Hyperdense foci within the
collecting system of the kidneys are worrisome for nephrolithiasis.
Indeterminate sub centimeter hypodensity in the right kidney is
noted on image 55.

Bladder is distended with their evacuation of the bladder wall.
The prostate is mildly prominent protruding through the base of the
bladder. The area that protrudes is somewhat hyperdense or hyper
enhancing and may represent a mass.  Right posterolateral bladder
diverticulum is noted.

Left inguinal hernia contains only adipose tissue.

No destructive bone lesion.  Minimal degenerative changes in the
lumbar spine.

Review of the MIP images confirms the above findings.
IMPRESSION: Focal dissection of the infrarenal aorta does not appear flow
limiting and is related to atherosclerotic change.

Significant narrowing at the origin of the celiac axis.  Mild
changes of the SMA without significant focal narrowing.

Significant stenosis at the origin of the right common iliac
artery.  There is also significant narrowing at the origin of the
right superficial femoral artery.  A right BKA is noted.

There are atherosclerotic changes in the left common and external
iliac artery without significant narrowing.  Similarly, there is
disease in the left superficial femoral artery without significant
focal narrowing.  Three-vessel runoff is present to the distal left
leg as described.

Indeterminate 1.6 cm lesion in the left lobe of the liver.  Because
of the patient's age, he is at least at average risk of malignancy.
MRI is recommended further characterize.  There are also a number
of foci of contrast enhancement throughout the liver which are
nonspecific.

Bilateral nephrolithiasis is suspected.  This may simply represent
a test bolus with contrast collected in the collecting system.

Left inguinal hernia containing adipose tissue.

Cholelithiasis.

Bladder distention and trabeculation with a small diverticulum.

Prostate is prominent. A central prostate mass may be present as
described above.  Correlate with PSA and physical exam.

## 2015-05-22 ENCOUNTER — Other Ambulatory Visit: Payer: Self-pay

## 2015-06-12 ENCOUNTER — Encounter: Payer: Self-pay | Admitting: Vascular Surgery

## 2015-06-13 ENCOUNTER — Other Ambulatory Visit: Payer: Self-pay | Admitting: Vascular Surgery

## 2015-06-13 DIAGNOSIS — I70219 Atherosclerosis of native arteries of extremities with intermittent claudication, unspecified extremity: Secondary | ICD-10-CM

## 2015-06-14 ENCOUNTER — Ambulatory Visit (HOSPITAL_COMMUNITY)
Admission: RE | Admit: 2015-06-14 | Discharge: 2015-06-14 | Disposition: A | Discharge status: Home / Self Care | Payer: Medicare Other | Source: Ambulatory Visit | Attending: Vascular Surgery | Admitting: Vascular Surgery

## 2015-06-14 ENCOUNTER — Ambulatory Visit (INDEPENDENT_AMBULATORY_CARE_PROVIDER_SITE_OTHER): Payer: Medicare Other | Admitting: Family

## 2015-06-14 ENCOUNTER — Encounter: Payer: Self-pay | Admitting: Family

## 2015-06-14 VITALS — BP 160/72 | HR 65 | Temp 97.2°F | Resp 16 | Ht 69.0 in | Wt 207.0 lb

## 2015-06-14 DIAGNOSIS — I70219 Atherosclerosis of native arteries of extremities with intermittent claudication, unspecified extremity: Secondary | ICD-10-CM | POA: Insufficient documentation

## 2015-06-14 DIAGNOSIS — Z87891 Personal history of nicotine dependence: Secondary | ICD-10-CM | POA: Diagnosis not present

## 2015-06-14 DIAGNOSIS — I25810 Atherosclerosis of coronary artery bypass graft(s) without angina pectoris: Secondary | ICD-10-CM | POA: Diagnosis not present

## 2015-06-14 DIAGNOSIS — Z89511 Acquired absence of right leg below knee: Secondary | ICD-10-CM | POA: Diagnosis not present

## 2015-06-14 DIAGNOSIS — Z8739 Personal history of other diseases of the musculoskeletal system and connective tissue: Secondary | ICD-10-CM | POA: Diagnosis not present

## 2015-06-14 NOTE — Progress Notes (Signed)
[facility] HISTORY AND PHYSICAL -PAD  History of Present Illness [name] Cook is a 74 y.o. male patient of Dr. 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 He is ambulatory with his prosthesis.   He comes in for a 18 month follow up visit.  Pt had an MI and placement of a stent in August of 2014 and again in November 2015. Pt states Dr. Percival Spanish has been checking his carotid arteries by ultrasound and is aware of the left bruit. Pt states he knows he has a heart murmur.   He denies any significant calf claudication, but does have some hip claudication which has been stable. He denies rest pain or nonhealing ulcers.  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  Pt states his blood pressure is usually about 140's/70's, states it goes up in a Dr. Gabriel Carina. He is shifting most of his weight onto his left leg; states the reason for this is he forms ulcers on his right BKA stump if he bears too much weight. 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  He does not seem to have claudication in left leg with walking, has no non healing wounds.   Pt Diabetic: Yes, wife states his A1C's indicate prediabetes, no A1C on file Pt smoker: former smoker, quit in 1999, started smoking in his early 20's  Pt meds include: Statin :Yes Betablocker: Yes ASA: Yes Other anticoagulants/antiplatelets: Effient, for cardiac stents  Past Medical History  Diagnosis Date  . CAD (coronary artery disease)     2014 75% left main stenosis, LAD 80% followed by a mid subtotal stenosis, circumflex 90% stenosis, right coronary artery occluded, SVG to RCA occluded, SVG ramus occluded, SVG to OM patent without a sequential anastomosis noted. Graft had a focal 99% stenosis which was stented. 3.5 x 16 mm premier. b. cath 10/13/2014 DES to SVG to OM overlap  previous stent  . 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  . Necrotizing fasciitis   . Cogan syndrome   . Hypertension   . Peptic ulcer disease   . Dyslipidemia   . Diabetes mellitus without complication   . Arthritis   . Gallstones   . GERD (gastroesophageal reflux disease)   . Kidney stones   . Colon polyps   . Hepatic hemangioma 2014  . Cholelithiasis 2014  . Peripheral vascular disease   . Myocardial infarction 1999    Social History History  Substance Use Topics  . Smoking status: Former Smoker    Types: Cigarettes    Start date: 12/26/1962    Quit date: 11/25/1997  . Smokeless tobacco: Never Used  . Alcohol Use: 8.4 oz/week    14 Shots of liquor per week     Comment: daily     Family History Family History  Problem Relation Age of Onset  . Colon cancer Neg Hx   . Hyperlipidemia Mother   . Hypertension Mother   . Other Mother     varicose veins  . Heart disease Mother   . Diabetes Sister   . Heart disease Maternal Grandfather   . Rheum arthritis Father   . Heart attack Father     Past Surgical History  Procedure Laterality Date  . 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 Coronary artery bypass graft  1999    LIMA to the LAD, saphenous vein graft to obtuse marginal 1 and obtuse marginal 2, spahenous vein graft to intermediate, spahenous vein graft to the right coronary artery in 1999  . Appendectomy  2007  . Left heart catheterization with coronary/graft angiogram N/A 07/19/2013    Procedure: LEFT HEART CATHETERIZATION WITH Beatrix Fetters;  Surgeon: Peter M Martinique, MD;  Location: [facility] CATH LAB;  Service: Cardiovascular;  Laterality: N/A;  . Percutaneous coronary stent intervention (pci-s)  07/19/2013    Procedure: PERCUTANEOUS CORONARY STENT INTERVENTION (PCI-S);  Surgeon: Peter M Martinique, MD;  Location: [facility] CATH LAB;  Service: Cardiovascular;;  . Left heart catheterization with coronary angiogram N/A 10/13/2014    Procedure: LEFT HEART  CATHETERIZATION WITH CORONARY ANGIOGRAM;  Surgeon: Peter M Martinique, MD;  Location: [facility] CATH LAB;  Service: Cardiovascular;  Laterality: N/A;    Allergies  Allergen Reactions  . Altace [Ramipril] Other (See Comments)    hypotension    Current Outpatient Prescriptions  Medication Sig Dispense Refill  . acetaminophen (TYLENOL) 500 MG tablet Take 500 mg by mouth every 6 (six) hours as needed for pain.    Marland Kitchen amLODipine (NORVASC) 5 MG tablet Take 1 tablet by mouth  daily 90 tablet 0  . aspirin 81 MG tablet Take 81 mg by mouth daily.      Marland Kitchen atorvastatin (LIPITOR) 40 MG tablet Take 1 tablet (40 mg total) by mouth daily. 90 tablet 3  . Calcium Carbonate-Vitamin D (CALCIUM-CARB 600 + D) 600-125 MG-UNIT TABS Take 1 tablet by mouth daily.      . finasteride (PROSCAR) 5 MG tablet Take 5 mg by mouth daily.    . hydrochlorothiazide (HYDRODIURIL) 25 MG tablet Take 1 tablet (25 mg total) by mouth daily. 90 tablet 3  . isosorbide mononitrate (IMDUR) 30 MG 24 hr tablet Take 1 tablet (30 mg total) by mouth daily. 30 tablet 6  . lisinopril (PRINIVIL,ZESTRIL) 10 MG tablet Take 10 mg by mouth every morning.     Marland Kitchen lisinopril (PRINIVIL,ZESTRIL) 5 MG tablet Take 5 mg by mouth at bedtime.    . metFORMIN (GLUCOPHAGE) 500 MG tablet Take 1 tablet by mouth  daily 90 tablet 3  . metoprolol succinate (TOPROL-XL) 50 MG 24 hr tablet Take 1 tablet (50 mg total) by mouth daily. Take with or immediately following a meal. 90 tablet 3  . Multiple Vitamin (MULTIVITAMIN) tablet Take 1 tablet by mouth daily.      . nitroGLYCERIN (NITROSTAT) 0.4 MG SL tablet Place 1 tablet (0.4 mg total) under the tongue every 5 (five) minutes as needed for chest pain. 25 tablet 3  . NON FORMULARY Take 10-15 mLs by mouth as needed (for sleep). z quill at bedtime    . omeprazole (PRILOSEC) 20 MG capsule Take 1 capsule (20 mg total) by mouth daily. 30 capsule 6  . prasugrel (EFFIENT) 10 MG TABS tablet Take 1 tablet (10 mg total) by mouth daily. 30 tablet  11  . Probiotic Product (RESTORA) CAPS Take 1 capsule by mouth daily. 30 capsule 6  . ranitidine (ZANTAC) 75 MG tablet Take 75 mg by mouth at bedtime.     . tamsulosin (FLOMAX) 0.4 MG CAPS Take 0.8 mg by mouth.     . Wheat Dextrin (BENEFIBER) POWD Take by mouth daily.     No current facility-administered medications for this visit.    ROS: See HPI for pertinent positives and negatives.   Physical Examination  Filed Vitals:   06/14/15 1125  BP: 160/72  Pulse: 65  Temp: 97.2 F (36.2 C)  TempSrc: Oral  Resp: 16  Height: 5\' 9"  (1.753 m)  Weight: 207 lb (93.895 kg)  SpO2: 95%   Body mass index is 30.55 kg/(m^2).  General: A&O x 3, WDWN, obese male. Gait: limp Eyes: PERRLA. Pulmonary: CTAB, without wheezes , rales or rhonchi. Cardiac: regular Rhythm, + murmur.         Carotid Bruits Right Left   Negative Positive  Aorta is not palpable. Radial pulses: 2+ palpable and =                           VASCULAR EXAM: Extremities without ischemic changes, without Gangrene; without open wounds. Right BKA, prosthesis in place                                                                                                          LE Pulses Right Left       FEMORAL   palpable  not palpable        POPLITEAL  not palpable   not palpable       POSTERIOR TIBIAL  BKA   2+ palpable        DORSALIS PEDIS      ANTERIOR TIBIAL BKA  2+ palpable    Abdomen: soft, NT, no palpable masses. Skin: no rashes, no ulcers. Musculoskeletal: no muscle wasting or atrophy.  Neurologic: A&O X 3; Appropriate Affect,; MOTOR FUNCTION:  moving all extremities equally, motor strength 5/5 throughout. Speech is fluent/normal. CN 2-12 intact except he is hard of hearing.    Non-Invasive Vascular Imaging: DATE: 06/14/2015 ABI: RIGHT: BKA,   LEFT: 0.98 (12/08/13, 1.03), Waveforms: triphasic, TBI: 0.78 (0.78)   ASSESSMENT: Secundino Ellithorpe is a 74 y.o. 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 He is ambulatory with his prosthesis.   He comes in for a 18 month follow up visit. Today's left ABI and TBI remain normal compared to 18 months ago, with all triphasic waveforms. He limps due to ill fitting right BKA prosthesis that causes pressure areas on his stump. See Plan.    PLAN:  Based on the patient's vascular studies and examination, pt will return to clinic in 18 months with ABI's. He knows to return sooner of needed.  Referral given to [employer] to help him with his right BKA prosthesis that is causing ulcers on his right stump.  I discussed in depth with the patient the nature of atherosclerosis, and emphasized the importance of maximal medical management including strict control of blood pressure, blood glucose, and lipid levels, obtaining regular exercise, and continued cessation of smoking.  The patient is aware that without maximal medical management the underlying atherosclerotic disease process will progress, limiting the benefit of any interventions.  The patient was given information about PAD including signs, symptoms, treatment, what symptoms should prompt the patient to seek immediate medical  care, and risk reduction measures to take.  Clemon Chambers, RN, MSN, FNP-C [facility] Phone: [phone]  Clinic MD: Scot Dock  06/14/2015 11:55 AM

## 2015-08-16 ENCOUNTER — Encounter: Payer: Self-pay | Admitting: Cardiology

## 2015-08-16 ENCOUNTER — Ambulatory Visit (INDEPENDENT_AMBULATORY_CARE_PROVIDER_SITE_OTHER): Payer: Medicare Other | Admitting: Cardiology

## 2015-08-16 VITALS — BP 159/79 | HR 60 | Ht 70.0 in | Wt 208.0 lb

## 2015-08-16 DIAGNOSIS — I2510 Atherosclerotic heart disease of native coronary artery without angina pectoris: Secondary | ICD-10-CM

## 2015-08-16 DIAGNOSIS — I10 Essential (primary) hypertension: Secondary | ICD-10-CM | POA: Diagnosis not present

## 2015-08-16 DIAGNOSIS — I25810 Atherosclerosis of coronary artery bypass graft(s) without angina pectoris: Secondary | ICD-10-CM

## 2015-08-16 MED ORDER — ISOSORBIDE MONONITRATE ER 30 MG PO TB24: 30.0000 mg | ORAL_TABLET | Freq: Every day | ORAL | Status: DC

## 2015-08-16 MED ORDER — CHLORTHALIDONE 25 MG PO TABS: 25.0000 mg | ORAL_TABLET | Freq: Every day | ORAL | Status: DC

## 2015-08-16 MED ORDER — AMLODIPINE BESYLATE 5 MG PO TABS: ORAL_TABLET | ORAL | Status: DC

## 2015-08-16 NOTE — Patient Instructions (Signed)
Medication Instructions:  Please stop HCTZ. Start Chlorthalidone 25 mg a day. Continue all other medications as listed.  Labwork: Please have blood work 1 week after starting Chlorthalidone. (BMP)  Follow-Up: Follow up in 6 months with Dr. Percival Spanish.  You will receive a letter in the mail 2 months before you are due.  Please call us when you receive this letter to schedule your follow up appointment.  Thank you for choosing [facility]!!

## 2015-08-16 NOTE — Progress Notes (Signed)
HPI The patient presents for followup of known coronary disease. The patient presented to [facility] on 10/12/2014 with chest pain and elevated troponin and ruled in for NSTEMI. He underwent cardiac catheterization on 10/13/2014 which showed EF 45-50%, 80-90% long proximal LAD stenosis, LAD occluded midvessel, 30% ost Ramus, LCX subtotal obcclusion in prox vessel and total occlusion midvessel, 50-60% stenosis in mid RCA with subtotal occlusion in mid to distal vessel. Occluded SVG to RCA, occluded SVG to ramus, patent LIMA to LAD, SVG to OM had 90-95% in-stent restenosis treated with DES. Given the fact he has 2 overlapping drug-eluting stent in SVG to OM, patient will need lifelong dual antiplatelet therapy.  He has had some chest discomfort and has taken about 5 nitroglycerin since I last saw him. However, this is sporadic and rare and he doesn't remember the last one. It is not an increasing pattern. It goes away easily after nitroglycerin. He cannot bring this on. It seems to clearly be stable.. There have been no reported palpitations, presyncope or syncope.  He is not exercising as much as I would like.  He does work on his boat.   Allergies  Allergen Reactions  . Altace [Ramipril] Other (See Comments)    hypotension    Current Outpatient Prescriptions  Medication Sig Dispense Refill  . acetaminophen (TYLENOL) 500 MG tablet Take 500 mg by mouth every 6 (six) hours as needed for pain.    Marland Kitchen aspirin 81 MG tablet Take 81 mg by mouth daily.      Marland Kitchen atorvastatin (LIPITOR) 40 MG tablet Take 1 tablet (40 mg total) by mouth daily. 90 tablet 3  . Calcium Carbonate-Vitamin D (CALCIUM-CARB 600 + D) 600-125 MG-UNIT TABS Take 1 tablet by mouth daily.      . finasteride (PROSCAR) 5 MG tablet Take 5 mg by mouth daily.    . hydrochlorothiazide (HYDRODIURIL) 25 MG tablet Take 1 tablet (25 mg total) by mouth daily. 90 tablet 3  . isosorbide mononitrate (IMDUR) 30 MG 24 hr tablet Take 1 tablet (30  mg total) by mouth daily. 30 tablet 6  . lisinopril (PRINIVIL,ZESTRIL) 10 MG tablet Take 10 mg by mouth every morning.     . metFORMIN (GLUCOPHAGE) 500 MG tablet Take 1 tablet by mouth  daily 90 tablet 3  . metoprolol succinate (TOPROL-XL) 50 MG 24 hr tablet Take 1 tablet (50 mg total) by mouth daily. Take with or immediately following a meal. 90 tablet 3  . Multiple Vitamin (MULTIVITAMIN) tablet Take 1 tablet by mouth daily.      . nitroGLYCERIN (NITROSTAT) 0.4 MG SL tablet Place 1 tablet (0.4 mg total) under the tongue every 5 (five) minutes as needed for chest pain. 25 tablet 3  . NON FORMULARY Take 10-15 mLs by mouth as needed (for sleep). z quill at bedtime    . omeprazole (PRILOSEC) 20 MG capsule Take 1 capsule (20 mg total) by mouth daily. 30 capsule 6  . prasugrel (EFFIENT) 10 MG TABS tablet Take 1 tablet (10 mg total) by mouth daily. 30 tablet 11  . Probiotic Product (RESTORA) CAPS Take 1 capsule by mouth daily. 30 capsule 6  . ranitidine (ZANTAC) 75 MG tablet Take 75 mg by mouth at bedtime.     . tamsulosin (FLOMAX) 0.4 MG CAPS Take 0.8 mg by mouth daily.     . Wheat Dextrin (BENEFIBER) POWD Take by mouth daily.    Marland Kitchen amLODipine (NORVASC) 5 MG tablet Take 1 tablet by mouth  daily (Patient not taking: Reported on 08/16/2015) 90 tablet 0   No current facility-administered medications for this visit.    Past Medical History  Diagnosis Date  . CAD (coronary artery disease)     2014 75% left main stenosis, LAD 80% followed by a mid subtotal stenosis, circumflex 90% stenosis, right coronary artery occluded, SVG to RCA occluded, SVG ramus occluded, SVG to OM patent without a sequential anastomosis noted. Graft had a focal 99% stenosis which was stented. 3.5 x 16 mm premier. b. cath 10/13/2014 DES to SVG to OM overlap previous stent  . 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  . Necrotizing fasciitis   . Cogan syndrome   . Hypertension   . Peptic ulcer disease   . Dyslipidemia     . Diabetes mellitus without complication   . Arthritis   . Gallstones   . GERD (gastroesophageal reflux disease)   . Kidney stones   . Colon polyps   . Hepatic hemangioma 2014  . Cholelithiasis 2014  . Peripheral vascular disease   . Myocardial infarction 1999    Past Surgical History  Procedure Laterality Date  . 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 Coronary artery bypass graft  1999    LIMA to the LAD, saphenous vein graft to obtuse marginal 1 and obtuse marginal 2, spahenous vein graft to intermediate, spahenous vein graft to the right coronary artery in 1999  . Appendectomy  2007  . Left heart catheterization with coronary/graft angiogram N/A 07/19/2013    Procedure: LEFT HEART CATHETERIZATION WITH Beatrix Fetters;  Surgeon: Peter M Martinique, MD;  Location[facility] C[facility] LAB;  Service: Cardiovascular;  Laterality: N/A;  . Percutaneous coronary stent intervention (pci-s)  07/19/2013    Procedure: PERCUTANEOUS CORONARY STENT INTERVENTION (PCI-S);  Surgeon: Peter M Martinique, MD;  Location: [facility] CATH LAB;  Service: Cardiovascular;;  . Left heart catheterization with coronary angiogram N/A 10/13/2014    Procedure: LEFT HEART CATHETERIZATION WITH CORONARY ANGIOGRAM;  Surgeon: Peter M Martinique, MD;  Location: [facility] CATH LAB;  Service: Cardiovascular;  Laterality: N/A;  ough with lying down.  Otherwise as  ROS:  Insomnia. Otherwise as stated in the HPI and negative for all other systems.  PHYSICAL EXAM BP 159/79 mmHg  Pulse 60  Ht 5\' 10"  (1.778 m)  Wt 208 lb (94.348 kg)  BMI 29.84 kg/m2 GENERAL:  Well appearing NECK:  No jugular venous distention, waveform within normal limits, carotid upstroke brisk and symmetric, soft bilateral carotid bruits bruits, no thyromegaly LUNGS:  Clear to auscultation bilaterally CHEST:  Well healed sternotomy scar.   HEART:  PMI not displaced or sustained,S1 and S2 within normal limits, no S3, no S4, no clicks, no rubs, soft early peaking apical systolic  murmur heard also at the right upper sternal border murmurs ABD:  Flat, positive bowel sounds normal in frequency in pitch, no bruits, no rebound, no guarding, no midline pulsatile mass, no hepatomegaly, no splenomegaly EXT:  2 plus pulses throughout, mild left leg edema, no cyanosis no clubbing, status post amputation right leg  EKG:  Sinus rhythm, rate 60, interventricular conduction delay, left axis deviation.  08/16/2015   ASSESSMENT AND PLAN  CAD He seems to have a stable pattern of infrequent angina. At this point no change in therapy is indicated.  He will continue meds as listed.  HTN His blood pressure is not as well controlled with light. I'm going to switch him to chlorthalidone  25 mg daily for better blood pressure control.  Carotid stenosis He has left 40-69% bilateral stenosis. We will follow again with Doppler in March of next year  Dyslipidemia He needs to have a fasting lipid profile.    Cardiomyopathy He had a slightly low ejection fraction but it was actually well preserved at the time of his follow-up echo. No change in therapy is indicated.

## 2015-09-13 ENCOUNTER — Telehealth: Payer: Self-pay | Admitting: Family Medicine

## 2015-09-21 ENCOUNTER — Ambulatory Visit (INDEPENDENT_AMBULATORY_CARE_PROVIDER_SITE_OTHER): Payer: Medicare Other | Admitting: Family Medicine

## 2015-09-21 ENCOUNTER — Encounter: Payer: Self-pay | Admitting: Family Medicine

## 2015-09-21 VITALS — BP 137/70 | HR 68 | Temp 97.3°F | Ht 70.0 in | Wt 204.2 lb

## 2015-09-21 DIAGNOSIS — I10 Essential (primary) hypertension: Secondary | ICD-10-CM | POA: Diagnosis not present

## 2015-09-21 DIAGNOSIS — E785 Hyperlipidemia, unspecified: Secondary | ICD-10-CM

## 2015-09-21 DIAGNOSIS — I25810 Atherosclerosis of coronary artery bypass graft(s) without angina pectoris: Secondary | ICD-10-CM | POA: Diagnosis not present

## 2015-09-21 DIAGNOSIS — E1159 Type 2 diabetes mellitus with other circulatory complications: Secondary | ICD-10-CM

## 2015-09-21 LAB — POCT GLYCOSYLATED HEMOGLOBIN (HGB A1C): HEMOGLOBIN A1C: 6.1

## 2015-09-21 LAB — POCT UA - MICROALBUMIN: MICROALBUMIN (UR) POC: 20 mg/L

## 2015-09-21 NOTE — Progress Notes (Signed)
Subjective:    Patient ID: [name] Cook, male    DOB: March 15, 1941, 74 y.o.   MRN: [id number]  HPI 74 year old gentleman who is here to follow-up diabetes and hypertension. He has a cardiologist as well as a vascular surgeon who follow other problems. He has had stents 2 and has a carotid bruit that is followed by the surgeon. He does not monitor his sugars at home because to watch his weight and diet. His weight is down 4 pounds since last visit here. Blood pressure is well controlled on lisinopril and amlodipine.  Patient Active Problem List   Diagnosis Date Noted  . NSTEMI (non-ST elevated myocardial infarction) ([place]) 10/12/2014  . Hx of right BKA ([place]) 10/12/2014  . 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 Atherosclerosis of native arteries of extremity with intermittent claudication ([place]) 12/08/2013  . Diabetes mellitus with circulatory complication ([place])   . Essential hypertension, benign 07/17/2013  . Hypokalemia 07/17/2013  . Non-ST elevation myocardial infarction (NSTEMI), initial episode of care ([place]) 07/16/2013  . GERD (gastroesophageal reflux disease) 03/15/2013  . Peripheral vascular disease ([place]) 03/03/2013  . CAROTID BRUIT 02/06/2011  . Dyslipidemia 10/31/2009  . Essential hypertension 10/31/2009  . Coronary atherosclerosis 10/31/2009   Outpatient Encounter Prescriptions as of 09/21/2015  Medication Sig  . acetaminophen (TYLENOL) 500 MG tablet Take 500 mg by mouth every 6 (six) hours as needed for pain.  Marland Kitchen amLODipine (NORVASC) 5 MG tablet Take 1 tablet by mouth  daily  . aspirin 81 MG tablet Take 81 mg by mouth daily.    Marland Kitchen atorvastatin (LIPITOR) 40 MG tablet Take 1 tablet (40 mg total) by mouth daily.  . Calcium Carbonate-Vitamin D (CALCIUM-CARB 600 + D) 600-125 MG-UNIT TABS Take 1 tablet by mouth daily.    . chlorthalidone (HYGROTON) 25 MG tablet Take 1 tablet (25 mg total) by mouth daily.  . finasteride (PROSCAR) 5 MG  tablet Take 5 mg by mouth daily.  . isosorbide mononitrate (IMDUR) 30 MG 24 hr tablet Take 1 tablet (30 mg total) by mouth daily.  Marland Kitchen lisinopril (PRINIVIL,ZESTRIL) 10 MG tablet Take 10 mg by mouth every morning.   . metFORMIN (GLUCOPHAGE) 500 MG tablet Take 1 tablet by mouth  daily  . metoprolol succinate (TOPROL-XL) 50 MG 24 hr tablet Take 1 tablet (50 mg total) by mouth daily. Take with or immediately following a meal.  . Multiple Vitamin (MULTIVITAMIN) tablet Take 1 tablet by mouth daily.    . nitroGLYCERIN (NITROSTAT) 0.4 MG SL tablet Place 1 tablet (0.4 mg total) under the tongue every 5 (five) minutes as needed for chest pain.  . NON FORMULARY Take 10-15 mLs by mouth as needed (for sleep). z quill at bedtime  . omeprazole (PRILOSEC) 20 MG capsule Take 1 capsule (20 mg total) by mouth daily.  . prasugrel (EFFIENT) 10 MG TABS tablet Take 1 tablet (10 mg total) by mouth daily.  . Probiotic Product (RESTORA) CAPS Take 1 capsule by mouth daily.  . ranitidine (ZANTAC) 75 MG tablet Take 75 mg by mouth at bedtime.   . tamsulosin (FLOMAX) 0.4 MG CAPS Take 0.8 mg by mouth daily.   . Wheat Dextrin (BENEFIBER) POWD Take by mouth daily.   No facility-administered encounter medications on file as of 09/21/2015.      Review of Systems  Constitutional: Negative.   Respiratory: Negative.   Cardiovascular: Negative.   Neurological: Negative.   Psychiatric/Behavioral: Negative.  Objective:   Physical Exam  Constitutional: He is oriented to person, place, and time. He appears well-developed and well-nourished.  HENT:  Head: Normocephalic.  Neck: Normal range of motion.  Bruit heard on left but strength of carotid arteries is equal by palpation  Cardiovascular: Normal rate and regular rhythm.   Pulmonary/Chest: Effort normal and breath sounds normal.  Neurological: He is alert and oriented to person, place, and time.          Assessment & Plan:  1. Essential hypertension Since the  addition of amlodipine and his blood pressure has normalized. Continue same - POCT glycosylated hemoglobin (Hb A1C) - POCT UA - Microalbumin - CMP14+EGFR - Lipid panel  2. Dyslipidemia Lipids were almost at goal when last checked he has lost a few pounds and maybe LDL will be less than 100 - POCT glycosylated hemoglobin (Hb A1C) - POCT UA - Microalbumin - CMP14+EGFR - Lipid panel  3. Type 2 diabetes mellitus with other circulatory complication, without long-term current use of insulin (HCC) A1c today is 6.1 which is unchanged from last check almost 10 months ago. This indicates good control and I would not recommend any change - POCT glycosylated hemoglobin (Hb A1C) - POCT UA - Microalbumin - CMP14+EGFR - Lipid panel  Wardell Honour MD  Notice: Thisdictation was prepared with Dragon dictation along with smaller phrase technology. Any transcriptional errors that result from this process are unintentional and may not be corrected upon review - Microalbumin, urine

## 2015-09-22 LAB — CMP14+EGFR
A/G RATIO: 1.5 (ref 1.1–2.5)
ALK PHOS: 37 IU/L — AB (ref 39–117)
ALT: 35 IU/L (ref 0–44)
AST: 26 IU/L (ref 0–40)
Albumin: 4.3 g/dL (ref 3.5–4.8)
BUN/Creatinine Ratio: 16 (ref 10–22)
BUN: 15 mg/dL (ref 8–27)
Bilirubin Total: 0.3 mg/dL (ref 0.0–1.2)
CALCIUM: 9.5 mg/dL (ref 8.6–10.2)
CO2: 28 mmol/L (ref 18–29)
CREATININE: 0.93 mg/dL (ref 0.76–1.27)
Chloride: 98 mmol/L (ref 97–106)
GFR calc Af Amer: 94 mL/min/{1.73_m2} (ref >59)
GFR, EST NON AFRICAN AMERICAN: 81 mL/min/{1.73_m2} (ref >59)
GLOBULIN, TOTAL: 2.8 g/dL (ref 1.5–4.5)
Glucose: 93 mg/dL (ref 65–99)
Potassium: 4.1 mmol/L (ref 3.5–5.2)
SODIUM: 140 mmol/L (ref 136–144)
Total Protein: 7.1 g/dL (ref 6.0–8.5)

## 2015-09-22 LAB — LIPID PANEL
CHOL/HDL RATIO: 3.1 ratio (ref 0.0–5.0)
Cholesterol, Total: 160 mg/dL (ref 100–199)
HDL: 51 mg/dL (ref >39)
LDL CALC: 77 mg/dL (ref 0–99)
Triglycerides: 160 mg/dL — ABNORMAL HIGH (ref 0–149)
VLDL Cholesterol Cal: 32 mg/dL (ref 5–40)

## 2015-09-22 LAB — MICROALBUMIN, URINE: Microalbumin, Urine: 13.7 ug/mL

## 2015-10-13 ENCOUNTER — Other Ambulatory Visit: Payer: Self-pay | Admitting: Cardiology

## 2015-10-13 ENCOUNTER — Other Ambulatory Visit: Payer: Self-pay | Admitting: Physician Assistant

## 2015-11-28 DIAGNOSIS — N401 Enlarged prostate with lower urinary tract symptoms: Secondary | ICD-10-CM | POA: Diagnosis not present

## 2015-11-28 DIAGNOSIS — N138 Other obstructive and reflux uropathy: Secondary | ICD-10-CM | POA: Diagnosis not present

## 2015-11-28 DIAGNOSIS — Z125 Encounter for screening for malignant neoplasm of prostate: Secondary | ICD-10-CM | POA: Diagnosis not present

## 2016-01-25 ENCOUNTER — Emergency Department (HOSPITAL_COMMUNITY)
Admission: EM | Admit: 2016-01-25 | Discharge: 2016-01-25 | Disposition: A | Discharge status: Home / Self Care | Payer: Medicare Other | Attending: Emergency Medicine | Admitting: Emergency Medicine

## 2016-01-25 ENCOUNTER — Encounter (HOSPITAL_COMMUNITY): Payer: Self-pay | Admitting: *Deleted

## 2016-01-25 ENCOUNTER — Emergency Department (HOSPITAL_COMMUNITY): Payer: Medicare Other

## 2016-01-25 DIAGNOSIS — M1990 Unspecified osteoarthritis, unspecified site: Secondary | ICD-10-CM | POA: Insufficient documentation

## 2016-01-25 DIAGNOSIS — Z86010 Personal history of colonic polyps: Secondary | ICD-10-CM | POA: Insufficient documentation

## 2016-01-25 DIAGNOSIS — E119 Type 2 diabetes mellitus without complications: Secondary | ICD-10-CM | POA: Insufficient documentation

## 2016-01-25 DIAGNOSIS — I252 Old myocardial infarction: Secondary | ICD-10-CM | POA: Insufficient documentation

## 2016-01-25 DIAGNOSIS — Z862 Personal history of diseases of the blood and blood-forming organs and certain disorders involving the immune mechanism: Secondary | ICD-10-CM | POA: Diagnosis not present

## 2016-01-25 DIAGNOSIS — Z79899 Other long term (current) drug therapy: Secondary | ICD-10-CM | POA: Diagnosis not present

## 2016-01-25 DIAGNOSIS — Z7984 Long term (current) use of oral hypoglycemic drugs: Secondary | ICD-10-CM | POA: Diagnosis not present

## 2016-01-25 DIAGNOSIS — R51 Headache: Secondary | ICD-10-CM | POA: Insufficient documentation

## 2016-01-25 DIAGNOSIS — Z951 Presence of aortocoronary bypass graft: Secondary | ICD-10-CM | POA: Insufficient documentation

## 2016-01-25 DIAGNOSIS — K219 Gastro-esophageal reflux disease without esophagitis: Secondary | ICD-10-CM | POA: Diagnosis not present

## 2016-01-25 DIAGNOSIS — I2510 Atherosclerotic heart disease of native coronary artery without angina pectoris: Secondary | ICD-10-CM | POA: Insufficient documentation

## 2016-01-25 DIAGNOSIS — I10 Essential (primary) hypertension: Secondary | ICD-10-CM | POA: Diagnosis present

## 2016-01-25 DIAGNOSIS — Z8669 Personal history of other diseases of the nervous system and sense organs: Secondary | ICD-10-CM | POA: Insufficient documentation

## 2016-01-25 DIAGNOSIS — Z87442 Personal history of urinary calculi: Secondary | ICD-10-CM | POA: Diagnosis not present

## 2016-01-25 DIAGNOSIS — Z8711 Personal history of peptic ulcer disease: Secondary | ICD-10-CM | POA: Insufficient documentation

## 2016-01-25 DIAGNOSIS — E785 Hyperlipidemia, unspecified: Secondary | ICD-10-CM | POA: Diagnosis not present

## 2016-01-25 DIAGNOSIS — Z87891 Personal history of nicotine dependence: Secondary | ICD-10-CM | POA: Diagnosis not present

## 2016-01-25 DIAGNOSIS — Z7982 Long term (current) use of aspirin: Secondary | ICD-10-CM | POA: Insufficient documentation

## 2016-01-25 DIAGNOSIS — Z9889 Other specified postprocedural states: Secondary | ICD-10-CM | POA: Diagnosis not present

## 2016-01-25 DIAGNOSIS — I160 Hypertensive urgency: Secondary | ICD-10-CM | POA: Insufficient documentation

## 2016-01-25 DIAGNOSIS — Z89511 Acquired absence of right leg below knee: Secondary | ICD-10-CM | POA: Diagnosis not present

## 2016-01-25 LAB — BASIC METABOLIC PANEL
ANION GAP: 12 (ref 5–15)
BUN: 10 mg/dL (ref 6–20)
CALCIUM: 8.8 mg/dL — AB (ref 8.9–10.3)
CHLORIDE: 107 mmol/L (ref 101–111)
CO2: 23 mmol/L (ref 22–32)
Creatinine, Ser: 1.03 mg/dL (ref 0.61–1.24)
GFR calc non Af Amer: >60 mL/min (ref >60)
Glucose, Bld: 156 mg/dL — ABNORMAL HIGH (ref 65–99)
POTASSIUM: 3.6 mmol/L (ref 3.5–5.1)
Sodium: 142 mmol/L (ref 135–145)

## 2016-01-25 LAB — CBC
HEMATOCRIT: 41.4 % (ref 39.0–52.0)
Hemoglobin: 14.4 g/dL (ref 13.0–17.0)
MCH: 33 pg (ref 26.0–34.0)
MCHC: 34.8 g/dL (ref 30.0–36.0)
MCV: 95 fL (ref 78.0–100.0)
PLATELETS: 217 10*3/uL (ref 150–400)
RBC: 4.36 MIL/uL (ref 4.22–5.81)
RDW: 13.3 % (ref 11.5–15.5)
WBC: 4.9 10*3/uL (ref 4.0–10.5)

## 2016-01-25 LAB — I-STAT TROPONIN, ED: Troponin i, poc: 0.01 ng/mL (ref 0.00–0.08)

## 2016-01-25 MED ORDER — IBUPROFEN 800 MG PO TABS
800.0000 mg | ORAL_TABLET | Freq: Once | ORAL | Status: AC
  Administered 2016-01-25: 800 mg via ORAL
  Filled 2016-01-25: qty 1

## 2016-01-25 MED ORDER — CLONIDINE HCL 0.2 MG PO TABS
0.2000 mg | ORAL_TABLET | Freq: Once | ORAL | Status: AC
Start: 2016-01-25 — End: 2016-01-25
  Administered 2016-01-25: 0.2 mg via ORAL
  Filled 2016-01-25: qty 1

## 2016-01-25 NOTE — ED Notes (Signed)
Patient presents stating for the past 3 days has been having trouble with his BP being elevated and at night has a headache.  Tonight he presents with c/o left headache.  Denies calling his PMD

## 2016-01-25 NOTE — Discharge Instructions (Signed)
Continue your blood pressure medications as before.  Keep a record of your blood pressures for the next several days, then follow these results up with your primary Dr. to discuss changes in your medications.  Return to the ER if symptoms significantly worsen or change.

## 2016-01-25 NOTE — ED Notes (Signed)
Pt c/o head pressure x 2 days. States that he thinks it is his BP. Sates that at home his BP cuff was not working. Takes BP meds and has taken them as ordered.

## 2016-01-25 NOTE — ED Notes (Signed)
Patient left at this time with all belongings. 

## 2016-01-25 NOTE — ED Provider Notes (Signed)
CSN: [id number]     Arrival date & time 01/25/16  0026 History  By signing my name below, I, Altamease Oiler, attest that this documentation has been prepared under the direction and in the presence of Veryl Speak, MD. Electronically Signed: Altamease Oiler, ED Scribe. 01/25/2016. 2:02 AM   Chief Complaint  Patient presents with  . Hypertension  . Headache   The history is provided by the patient. No language interpreter was used.   [name] Cook is a 75 y.o. male with history of HTN, DM, dyslipidemia, and MI s/p CABG and coronary stenting who presents to the Emergency Department complaining of a new,  intermittent,  6/10 in severity throbbing, left-sided headache with onset 2 days ago. His blood pressure was 195/94 at home. He has been taking his antihypertensive medication and denies any recent changes or missed doses. Pt also complains of intermittent coughing spells that he treated with Mucinex DM. Pt denies chest pain, SOB, and new LE swelling.   Past Medical History  Diagnosis Date  . CAD (coronary artery disease)     2014 75% left main stenosis, LAD 80% followed by a mid subtotal stenosis, circumflex 90% stenosis, right coronary artery occluded, SVG to RCA occluded, SVG ramus occluded, SVG to OM patent without a sequential anastomosis noted. Graft had a focal 99% stenosis which was stented. 3.5 x 16 mm premier. b. cath 10/13/2014 DES to SVG to OM overlap previous stent  . Hx of right BKA ([place])     a. 2/2 necrotizing fasciitis.  . Necrotizing fasciitis ([place])   . Cogan syndrome   . Hypertension   . Peptic ulcer disease   . Dyslipidemia   . Diabetes mellitus without complication ([place])   . Arthritis   . Gallstones   . GERD (gastroesophageal reflux disease)   . Kidney stones   . Colon polyps   . Hepatic hemangioma 2014  . Cholelithiasis 2014  . Peripheral vascular disease ([place])   . Myocardial infarction [facility]) 1999   Past Surgical History  Procedure Laterality Date  . Leg  amputation  1999    Right BKA  . Coronary artery bypass graft  1999    LIMA to the LAD, saphenous vein graft to obtuse marginal 1 and obtuse marginal 2, spahenous vein graft to intermediate, spahenous vein graft to the right coronary artery in 1999  . Appendectomy  2007  . Left heart catheterization with coronary/graft angiogram N/A 07/19/2013    Procedure: LEFT HEART CATHETERIZATION WITH Beatrix Fetters;  Surgeon: Peter M Martinique, MD;  Location[facility] C[facility] LAB;  Service: Cardiovascular;  Laterality: N/A;  . Percutaneous coronary stent intervention (pci-s)  07/19/2013    Procedure: PERCUTANEOUS CORONARY STENT INTERVENTION (PCI-S);  Surgeon: Peter M Martinique, MD;  Location: [facility] CATH LAB;  Service: Cardiovascular;;  . Left heart catheterization with coronary angiogram N/A 10/13/2014    Procedure: LEFT HEART CATHETERIZATION WITH CORONARY ANGIOGRAM;  Surgeon: Peter M Martinique, MD;  Location[facility] LAB;  Service: Cardiovascular;  Laterality: N/A;   Family History  Problem Relation Age of Onset  . Colon cancer Neg Hx   . Hyperlipidemia Mother   . Hypertension Mother   . Other Mother     varicose veins  . Heart disease Mother   . Diabetes Sister   . Heart disease Maternal Grandfather   . Rheum arthritis Father   . Heart attack Father    Social History  Substance Use Topics  . Smoking status: Former Smoker    Types:  Cigarettes    Start date: 12/26/1962    Quit date: 11/25/1997  . Smokeless tobacco: Never Used  . Alcohol Use: 8.4 oz/week    14 Shots of liquor per week     Comment: daily     Review of Systems  10 Systems reviewed and all are negative for acute change except as noted in the HPI.  Allergies  Altace  Home Medications   Prior to Admission medications   Medication Sig Start Date End Date Taking? Authorizing Provider  acetaminophen (TYLENOL) 500 MG tablet Take 500 mg by mouth every 6 (six) hours as needed for pain.    Historical Provider, MD  amLODipine (NORVASC) 5  MG tablet Take 1 tablet by mouth  daily 08/16/15   Minus Breeding, MD  aspirin 81 MG tablet Take 81 mg by mouth daily.      Historical Provider, MD  atorvastatin (LIPITOR) 40 MG tablet Take 1 tablet (40 mg total) by mouth daily. 09/05/14   Minus Breeding, MD  Calcium Carbonate-Vitamin D (CALCIUM-CARB 600 + D) 600-125 MG-UNIT TABS Take 1 tablet by mouth daily.      Historical Provider, MD  chlorthalidone (HYGROTON) 25 MG tablet Take 1 tablet (25 mg total) by mouth daily. 08/16/15   Minus Breeding, MD  EFFIENT 10 MG TABS tablet Take 1 tablet by mouth  daily 10/13/15   Minus Breeding, MD  finasteride (PROSCAR) 5 MG tablet Take 5 mg by mouth daily.    Historical Provider, MD  isosorbide mononitrate (IMDUR) 30 MG 24 hr tablet Take 1 tablet (30 mg total) by mouth daily. 08/16/15   Minus Breeding, MD  lisinopril (PRINIVIL,ZESTRIL) 10 MG tablet Take 10 mg by mouth every morning.  12/18/14   Historical Provider, MD  metFORMIN (GLUCOPHAGE) 500 MG tablet Take 1 tablet by mouth  daily 12/20/14   Wardell Honour, MD  metoprolol succinate (TOPROL-XL) 50 MG 24 hr tablet Take 1 tablet by mouth  daily with or immediately  following a meal 10/13/15   Minus Breeding, MD  Multiple Vitamin (MULTIVITAMIN) tablet Take 1 tablet by mouth daily.      Historical Provider, MD  nitroGLYCERIN (NITROSTAT) 0.4 MG SL tablet Place 1 tablet (0.4 mg total) under the tongue every 5 (five) minutes as needed for chest pain. 02/08/15   Minus Breeding, MD  NON FORMULARY Take 10-15 mLs by mouth as needed (for sleep). z quill at bedtime    Historical Provider, MD  omeprazole (PRILOSEC) 20 MG capsule Take 1 capsule (20 mg total) by mouth daily. 08/17/13   Jerene Bears, MD  Probiotic Product (RESTORA) CAPS Take 1 capsule by mouth daily. 08/17/13   Jerene Bears, MD  ranitidine (ZANTAC) 75 MG tablet Take 75 mg by mouth at bedtime.     Historical Provider, MD  tamsulosin (FLOMAX) 0.4 MG CAPS Take 0.8 mg by mouth daily.     Historical Provider, MD   Wheat Dextrin (BENEFIBER) POWD Take by mouth daily.    Historical Provider, MD   BP 202/92 mmHg  Pulse 82  Temp(Src) 97.4 F (36.3 C) (Oral)  Resp 18  SpO2 96% Physical Exam  Constitutional: He is oriented to person, place, and time. He appears well-developed and well-nourished.  HENT:  Head: Normocephalic and atraumatic.  Eyes: EOM are normal.  Neck: Normal range of motion.  Cardiovascular: Normal rate, regular rhythm, normal heart sounds and intact distal pulses.   Pulmonary/Chest: Effort normal and breath sounds normal. No respiratory distress.  Abdominal: Soft.  He exhibits no distension. There is no tenderness.  Musculoskeletal: Normal range of motion.  Neurological: He is alert and oriented to person, place, and time. No cranial nerve deficit. He exhibits normal muscle tone. Coordination normal.  Skin: Skin is warm and dry.  Psychiatric: He has a normal mood and affect. Judgment normal.  Nursing note and vitals reviewed.   ED Course  Procedures (including critical care time) DIAGNOSTIC STUDIES: Oxygen Saturation is 96% on RA,  normal by my interpretation.    COORDINATION OF CARE: 2:00 AM Discussed treatment plan which includes lab work, EKG, CT head, and clonidine with pt at bedside and pt agreed to plan.  Labs Review Labs Reviewed  BASIC METABOLIC PANEL - Abnormal; Notable for the following:    Glucose, Bld 156 (*)    Calcium 8.8 (*)    All other components within normal limits  CBC  I-STAT TROPOININ, ED    Imaging Review No results found. I have personally reviewed and evaluated these images and lab results as part of my medical decision-making.   EKG Interpretation   Date/Time:  Thursday January 25 2016 00:40:53 EST Ventricular Rate:  80 PR Interval:  180 QRS Duration: 118 QT Interval:  414 QTC Calculation: 477 R Axis:   -64 Text Interpretation:  Normal sinus rhythm Left axis deviation Anteroseptal  infarct , age undetermined Abnormal ECG Confirmed by  Demika Langenderfer  MD, Adib Wahba  [id number]) on 01/25/2016 5:46:02 AM      MDM   Final diagnoses:  None    Patient is a 75 year old male who presents with elevated blood pressure and headache. This started 2 days ago. He denies any chest pain or difficulty breathing. He denies any missed medications or changes in his dosage. His neurologic exam is nonfocal and CT scan of the head is unremarkable. He was given a dose of clonidine with some improvement in his blood pressure. See no indication for admission and believe he is appropriate for discharge. He is to continue his medicines as before keep a record of his blood pressures. He should follow this up with his primary Dr. at his next appointment.  I personally performed the services described in this documentation, which was scribed in my presence. The recorded information has been reviewed and is accurate.       Veryl Speak, MD 01/25/16 [phone]

## 2016-02-02 ENCOUNTER — Encounter: Payer: Self-pay | Admitting: *Deleted

## 2016-02-12 ENCOUNTER — Encounter: Payer: Self-pay | Admitting: Internal Medicine

## 2016-03-21 ENCOUNTER — Ambulatory Visit (INDEPENDENT_AMBULATORY_CARE_PROVIDER_SITE_OTHER): Payer: Medicare Other | Admitting: Family Medicine

## 2016-03-21 ENCOUNTER — Encounter: Payer: Self-pay | Admitting: Family Medicine

## 2016-03-21 VITALS — BP 188/90 | HR 79 | Temp 97.6°F | Ht 70.0 in | Wt 211.4 lb

## 2016-03-21 DIAGNOSIS — E1159 Type 2 diabetes mellitus with other circulatory complications: Secondary | ICD-10-CM | POA: Diagnosis not present

## 2016-03-21 DIAGNOSIS — I10 Essential (primary) hypertension: Secondary | ICD-10-CM | POA: Diagnosis not present

## 2016-03-21 DIAGNOSIS — E785 Hyperlipidemia, unspecified: Secondary | ICD-10-CM

## 2016-03-21 LAB — BAYER DCA HB A1C WAIVED: HB A1C: 5.9 % (ref <7.0)

## 2016-03-21 MED ORDER — LISINOPRIL 20 MG PO TABS: 20.0000 mg | ORAL_TABLET | Freq: Every day | ORAL | Status: DC

## 2016-03-21 MED ORDER — CHLORTHALIDONE 25 MG PO TABS: 25.0000 mg | ORAL_TABLET | Freq: Every day | ORAL | Status: DC

## 2016-03-21 MED ORDER — CLONIDINE HCL 0.1 MG PO TABS: ORAL_TABLET | ORAL | Status: DC

## 2016-03-21 MED ORDER — METOPROLOL SUCCINATE ER 50 MG PO TB24: ORAL_TABLET | ORAL | Status: DC

## 2016-03-21 NOTE — Progress Notes (Signed)
Subjective:    Patient ID: [name] Cook, male    DOB: 04/04/1941, 75 y.o.   MRN: [id number]  HPI 75 year old gentleman here to follow-up blood pressure. He was seen in the emergency room last month with elevated pressure symptomatic with headache. Blood pressure medicines include lisinopril amlodipine and Toprol. He is out of chlorthalidone. Also uses Imdur which should help lower pressure. Today pressure is elevated at 188/90. He denies symptoms. In addition he's had no recent chest pains.  Patient Active Problem List   Diagnosis Date Noted  . NSTEMI (non-ST elevated myocardial infarction) ([place]) 10/12/2014  . Hx of right BKA ([place]) 10/12/2014  . 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 Atherosclerosis of native arteries of extremity with intermittent claudication ([place]) 12/08/2013  . Diabetes mellitus with circulatory complication ([place])   . Essential hypertension, benign 07/17/2013  . Hypokalemia 07/17/2013  . Non-ST elevation myocardial infarction (NSTEMI), initial episode of care ([place]) 07/16/2013  . GERD (gastroesophageal reflux disease) 03/15/2013  . Peripheral vascular disease ([place]) 03/03/2013  . CAROTID BRUIT 02/06/2011  . Dyslipidemia 10/31/2009  . Essential hypertension 10/31/2009  . Coronary atherosclerosis 10/31/2009   Outpatient Encounter Prescriptions as of 03/21/2016  Medication Sig  . acetaminophen (TYLENOL) 500 MG tablet Take 1,000 mg by mouth every 6 (six) hours as needed for mild pain.   Marland Kitchen amLODipine (NORVASC) 5 MG tablet Take 1 tablet by mouth  daily  . aspirin 81 MG tablet Take 81 mg by mouth daily.    Marland Kitchen atorvastatin (LIPITOR) 40 MG tablet Take 1 tablet (40 mg total) by mouth daily.  . Calcium Carbonate-Vitamin D (CALCIUM-CARB 600 + D) 600-125 MG-UNIT TABS Take 1 tablet by mouth daily.    . chlorthalidone (HYGROTON) 25 MG tablet Take 1 tablet (25 mg total) by mouth daily.  Marland Kitchen EFFIENT 10 MG TABS tablet Take 1 tablet by mouth   daily  . finasteride (PROSCAR) 5 MG tablet Take 5 mg by mouth daily.  . isosorbide mononitrate (IMDUR) 30 MG 24 hr tablet Take 1 tablet (30 mg total) by mouth daily.  Marland Kitchen lisinopril (PRINIVIL,ZESTRIL) 10 MG tablet Take 10 mg by mouth every morning.   . metFORMIN (GLUCOPHAGE) 500 MG tablet Take 1 tablet by mouth  daily  . metoprolol succinate (TOPROL-XL) 50 MG 24 hr tablet Take 1 tablet by mouth  daily with or immediately  following a meal  . Multiple Vitamin (MULTIVITAMIN) tablet Take 1 tablet by mouth daily.    . nitroGLYCERIN (NITROSTAT) 0.4 MG SL tablet Place 1 tablet (0.4 mg total) under the tongue every 5 (five) minutes as needed for chest pain.  . NON FORMULARY Take 10-15 mLs by mouth as needed (for sleep). z quill at bedtime  . omeprazole (PRILOSEC) 20 MG capsule Take 1 capsule (20 mg total) by mouth daily.  . Probiotic Product (RESTORA) CAPS Take 1 capsule by mouth daily.  . ranitidine (ZANTAC) 75 MG tablet Take 75 mg by mouth at bedtime.   . tamsulosin (FLOMAX) 0.4 MG CAPS Take 0.8 mg by mouth daily.   . Wheat Dextrin (BENEFIBER) POWD Take 1 Applicatorful by mouth daily.    No facility-administered encounter medications on file as of 03/21/2016.      Review of Systems  Constitutional: Negative.   Respiratory: Negative.   Cardiovascular: Negative.   Neurological: Negative.   Psychiatric/Behavioral: Negative.        Objective:   Physical Exam  Constitutional: He is oriented to person, place, and  time. He appears well-developed and well-nourished.  HENT:  Head: Normocephalic.  Cardiovascular: Normal rate, regular rhythm and normal heart sounds.   Pulmonary/Chest: Effort normal and breath sounds normal.  Neurological: He is alert and oriented to person, place, and time.  Psychiatric: He has a normal mood and affect. His behavior is normal.          Assessment & Plan:  1. Type 2 diabetes mellitus with other circulatory complication, without long-term current use of  insulin (HCC) Last A1c was 6.1 he really is not being treated for diabetes - Bayer DCA Hb A1c Waived  2. Dyslipidemia Last LDL was 77. - Lipid panel  3. Essential hypertension, benign Since blood pressure is still elevated increase lisinopril to 20 mg. Also provided prescription for clonidine 0.1 mg to take for pressure greater than 160/100 or symptomatic hypertension  Wardell Honour MD - [phone]

## 2016-03-22 LAB — LIPID PANEL
CHOLESTEROL TOTAL: 267 mg/dL — AB (ref 100–199)
Chol/HDL Ratio: 6.4 ratio units — ABNORMAL HIGH (ref 0.0–5.0)
HDL: 42 mg/dL (ref >39)
LDL Calculated: 176 mg/dL — ABNORMAL HIGH (ref 0–99)
TRIGLYCERIDES: 247 mg/dL — AB (ref 0–149)
VLDL CHOLESTEROL CAL: 49 mg/dL — AB (ref 5–40)

## 2016-03-22 LAB — CMP14+EGFR
A/G RATIO: 1.1 — AB (ref 1.2–2.2)
ALK PHOS: 31 IU/L — AB (ref 39–117)
ALT: 27 IU/L (ref 0–44)
AST: 22 IU/L (ref 0–40)
Albumin: 3.7 g/dL (ref 3.5–4.8)
BILIRUBIN TOTAL: 0.4 mg/dL (ref 0.0–1.2)
BUN/Creatinine Ratio: 13 (ref 10–24)
BUN: 14 mg/dL (ref 8–27)
CHLORIDE: 101 mmol/L (ref 96–106)
CO2: 22 mmol/L (ref 18–29)
Calcium: 9 mg/dL (ref 8.6–10.2)
Creatinine, Ser: 1.05 mg/dL (ref 0.76–1.27)
GFR calc non Af Amer: 70 mL/min/{1.73_m2} (ref >59)
GFR, EST AFRICAN AMERICAN: 80 mL/min/{1.73_m2} (ref >59)
GLUCOSE: 101 mg/dL — AB (ref 65–99)
Globulin, Total: 3.4 g/dL (ref 1.5–4.5)
POTASSIUM: 4 mmol/L (ref 3.5–5.2)
Sodium: 141 mmol/L (ref 134–144)
TOTAL PROTEIN: 7.1 g/dL (ref 6.0–8.5)

## 2016-04-14 ENCOUNTER — Other Ambulatory Visit: Payer: Self-pay | Admitting: Family Medicine

## 2016-05-22 ENCOUNTER — Other Ambulatory Visit: Payer: Self-pay | Admitting: Family Medicine

## 2016-05-30 ENCOUNTER — Other Ambulatory Visit: Payer: Self-pay | Admitting: Family Medicine

## 2016-07-08 ENCOUNTER — Other Ambulatory Visit: Payer: Self-pay | Admitting: Family Medicine

## 2016-09-09 ENCOUNTER — Other Ambulatory Visit: Payer: Self-pay | Admitting: Family Medicine

## 2016-09-13 ENCOUNTER — Other Ambulatory Visit: Payer: Self-pay | Admitting: *Deleted

## 2016-09-13 MED ORDER — CHLORTHALIDONE 25 MG PO TABS: ORAL_TABLET | ORAL | 0 refills | Status: DC

## 2016-09-20 ENCOUNTER — Encounter: Payer: Self-pay | Admitting: Family Medicine

## 2016-09-20 ENCOUNTER — Ambulatory Visit (INDEPENDENT_AMBULATORY_CARE_PROVIDER_SITE_OTHER): Payer: Medicare Other | Admitting: Family Medicine

## 2016-09-20 VITALS — BP 134/69 | HR 65 | Temp 98.1°F | Ht 70.0 in | Wt 211.2 lb

## 2016-09-20 DIAGNOSIS — I10 Essential (primary) hypertension: Secondary | ICD-10-CM | POA: Diagnosis not present

## 2016-09-20 DIAGNOSIS — L570 Actinic keratosis: Secondary | ICD-10-CM | POA: Diagnosis not present

## 2016-09-20 DIAGNOSIS — E785 Hyperlipidemia, unspecified: Secondary | ICD-10-CM | POA: Diagnosis not present

## 2016-09-20 DIAGNOSIS — E1159 Type 2 diabetes mellitus with other circulatory complications: Secondary | ICD-10-CM

## 2016-09-20 NOTE — Patient Instructions (Signed)
Medicare Annual Wellness Visit  Jay and the medical providers at [facility] strive to bring you the best medical care.  In doing so we not only want to address your current medical conditions and concerns but also to detect new conditions early and prevent illness, disease and health-related problems.    Medicare offers a yearly Wellness Visit which allows our clinical staff to assess your need for preventative services including immunizations, lifestyle education, counseling to decrease risk of preventable diseases and screening for fall risk and other medical concerns.    This visit is provided free of charge (no copay) for all Medicare recipients. The clinical pharmacists at [facility] have begun to conduct these Wellness Visits which will also include a thorough review of all your medications.    As you primary medical provider recommend that you make an appointment for your Annual Wellness Visit if you have not done so already this year.  You may set up this appointment before you leave today or you may call back ([phone]) and schedule an appointment.  Please make sure when you call that you mention that you are scheduling your Annual Wellness Visit with the clinical pharmacist so that the appointment may be made for the proper length of time.     Continue current medications. Continue good therapeutic lifestyle changes which include good diet and exercise. Fall precautions discussed with patient. If an FOBT was given today- please return it to our front desk. If you are over 50 years old - you may need Prevnar 13 or the adult Pneumonia vaccine.  After your visit with us today you will receive a survey in the mail or online from [employer] regarding your care with us. Please take a moment to fill this out. Your feedback is very important to us as you can help us better understand your patient needs as well as improve  your experience and satisfaction. WE CARE ABOUT YOU!!!    

## 2016-09-20 NOTE — Progress Notes (Addendum)
Subjective:    Patient ID: [name] Cook, male    DOB: [birth year], 75 y.o.   MRN: [id number]  HPI   Patient is here today for followup of his chronic medical conditions including hypertension, hyperlipidemia and diabetes.  He also is concerned about a lesion on his face. Also has question about what he perceives as nerve pain in his leg. He has had a BK amputation in the past. Not sure whether this pain represents phantom limb pain. He has been diagnosed with diabetes in the past but he does not have diabetes and at worst may have some mild prediabetes. Blood pressures of been better controlled with when necessary clonidine. Pressure today is 134/69  Patient Active Problem List   Diagnosis Date Noted  . NSTEMI (non-ST elevated myocardial infarction) ([place]) 10/12/2014  . Hx of right BKA ([place]) 10/12/2014  . 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 Atherosclerosis of native arteries of extremity with intermittent claudication ([place]) 12/08/2013  . Diabetes mellitus with circulatory complication ([place])   . Essential hypertension, benign 07/17/2013  . Hypokalemia 07/17/2013  . Non-ST elevation myocardial infarction (NSTEMI), initial episode of care ([place]) 07/16/2013  . GERD (gastroesophageal reflux disease) 03/15/2013  . Peripheral vascular disease ([place]) 03/03/2013  . CAROTID BRUIT 02/06/2011  . Dyslipidemia 10/31/2009  . Essential hypertension 10/31/2009  . Coronary atherosclerosis 10/31/2009   Outpatient Encounter Prescriptions as of 09/20/2016  Medication Sig  . acetaminophen (TYLENOL) 500 MG tablet Take 1,000 mg by mouth every 6 (six) hours as needed for mild pain.   Marland Kitchen amLODipine (NORVASC) 5 MG tablet Take 1 tablet by mouth  daily  . aspirin 81 MG tablet Take 81 mg by mouth daily.    Marland Kitchen atorvastatin (LIPITOR) 40 MG tablet Take 1 tablet (40 mg total) by mouth daily.  . Calcium Carbonate-Vitamin D (CALCIUM-CARB 600 + D) 600-125 MG-UNIT TABS Take 1  tablet by mouth daily.    . chlorthalidone (HYGROTON) 25 MG tablet TAKE 1 TABLET (25 MG TOTAL) BY MOUTH DAILY.  . cloNIDine (CATAPRES) 0.1 MG tablet TAKE 1 TABLET AS NEEDED FOR BLOOD PRESSURES GREATER THAN 160/100  . doxylamine, Sleep, (UNISOM) 25 MG tablet Take 25 mg by mouth at bedtime.  . finasteride (PROSCAR) 5 MG tablet Take 5 mg by mouth daily.  Marland Kitchen lisinopril (PRINIVIL,ZESTRIL) 20 MG tablet TAKE 1 TABLET (20 MG TOTAL) BY MOUTH DAILY.  . metoprolol succinate (TOPROL-XL) 50 MG 24 hr tablet TAKE 1 TABLET BY MOUTH DAILY WITH OR IMMEDIATELY FOLLOWING A MEAL  . Multiple Vitamin (MULTIVITAMIN) tablet Take 1 tablet by mouth daily.    . nitroGLYCERIN (NITROSTAT) 0.4 MG SL tablet Place 1 tablet (0.4 mg total) under the tongue every 5 (five) minutes as needed for chest pain.  . NON FORMULARY Take 10-15 mLs by mouth as needed (for sleep). z quill at bedtime  . Probiotic Product (RESTORA) CAPS Take 1 capsule by mouth daily.  . ranitidine (ZANTAC) 75 MG tablet Take 75 mg by mouth 2 (two) times daily. 75 mg in the am, 150 mg at bedtime  . tamsulosin (FLOMAX) 0.4 MG CAPS Take 0.8 mg by mouth daily.   . Wheat Dextrin (BENEFIBER) POWD Take 1 Applicatorful by mouth daily.   Marland Kitchen EFFIENT 10 MG TABS tablet Take 1 tablet by mouth  daily (Patient not taking: Reported on 09/20/2016)  . metFORMIN (GLUCOPHAGE) 500 MG tablet Take 1 tablet by mouth  daily (Patient not taking: Reported on 09/20/2016)  . [DISCONTINUED] isosorbide  mononitrate (IMDUR) 30 MG 24 hr tablet Take 1 tablet (30 mg total) by mouth daily.  . [DISCONTINUED] omeprazole (PRILOSEC) 20 MG capsule Take 1 capsule (20 mg total) by mouth daily. (Patient not taking: Reported on 09/20/2016)   No facility-administered encounter medications on file as of 09/20/2016.         Review of Systems  Constitutional: Negative.   HENT: Negative.   Eyes: Negative.   Respiratory: Negative.   Cardiovascular: Negative.   Gastrointestinal: Negative.   Endocrine:  Negative.   Genitourinary: Negative.   Musculoskeletal: Negative.   Skin:       Lesion on face   Allergic/Immunologic: Negative.   Neurological: Negative.   Hematological: Negative.   Psychiatric/Behavioral: Negative.           Objective:   Physical Exam  Constitutional: He is oriented to person, place, and time. He appears well-developed and well-nourished.  Cardiovascular: Normal rate, regular rhythm and normal heart sounds.   Pulmonary/Chest: Effort normal and breath sounds normal.  Neurological: He is alert and oriented to person, place, and time.  Skin:  There are several keratosis on his face. These were frozen with liquid nitrogen. I had given him the option of watchful waiting or referral and he chose liquid nitrogen which I think is a reasonable way to deal with these lesions   There was one keratosis on the left side of his face and 3 small ones on the right side of his face that were frozen with liquid nitrogen  BP 134/69   Pulse 65   Temp 98.1 F (36.7 C) (Oral)   Ht '5\' 10"'$  (1.778 m)   Wt 211 lb 4 oz (95.8 kg)   BMI 30.31 kg/m          Assessment & Plan:  1. Type 2 diabetes mellitus with other circulatory complication, without long-term current use of insulin ([place]) Patient does not have.diabetes. He does have prediabetes and watches his carbohydrate intake and weight - CMP14+EGFR - Lipid panel  2. Dyslipidemia Lipids were elevated when last checked 6 months ago. Need to repeat an use the CV risk assessment formula to decide if treatment is appropriate - CMP14+EGFR - Lipid panel  3. Essential hypertension Blood pressures are better control. He was given the option of taking clonidine on a scheduled basis for continued use of when necessary and he chose the latter. - CMP14+EGFR  Wardell Honour MD - Lipid panel

## 2016-09-21 LAB — LIPID PANEL
CHOL/HDL RATIO: 6.1 ratio — AB (ref 0.0–5.0)
Cholesterol, Total: 256 mg/dL — ABNORMAL HIGH (ref 100–199)
HDL: 42 mg/dL (ref >39)
LDL Calculated: 154 mg/dL — ABNORMAL HIGH (ref 0–99)
Triglycerides: 299 mg/dL — ABNORMAL HIGH (ref 0–149)
VLDL Cholesterol Cal: 60 mg/dL — ABNORMAL HIGH (ref 5–40)

## 2016-09-21 LAB — CMP14+EGFR
A/G RATIO: 1.3 (ref 1.2–2.2)
ALT: 14 IU/L (ref 0–44)
AST: 18 IU/L (ref 0–40)
Albumin: 4 g/dL (ref 3.5–4.8)
Alkaline Phosphatase: 36 IU/L — ABNORMAL LOW (ref 39–117)
BILIRUBIN TOTAL: 0.3 mg/dL (ref 0.0–1.2)
BUN / CREAT RATIO: 13 (ref 10–24)
BUN: 14 mg/dL (ref 8–27)
CALCIUM: 9.4 mg/dL (ref 8.6–10.2)
CHLORIDE: 95 mmol/L — AB (ref 96–106)
CO2: 24 mmol/L (ref 18–29)
Creatinine, Ser: 1.09 mg/dL (ref 0.76–1.27)
GFR, EST AFRICAN AMERICAN: 77 mL/min/{1.73_m2} (ref >59)
GFR, EST NON AFRICAN AMERICAN: 67 mL/min/{1.73_m2} (ref >59)
GLOBULIN, TOTAL: 3.2 g/dL (ref 1.5–4.5)
Glucose: 110 mg/dL — ABNORMAL HIGH (ref 65–99)
POTASSIUM: 4.4 mmol/L (ref 3.5–5.2)
SODIUM: 139 mmol/L (ref 134–144)
TOTAL PROTEIN: 7.2 g/dL (ref 6.0–8.5)

## 2016-09-24 MED ORDER — ROSUVASTATIN CALCIUM 10 MG PO TABS: 10.0000 mg | ORAL_TABLET | Freq: Every day | ORAL | 3 refills | Status: DC

## 2016-09-24 NOTE — Addendum Note (Signed)
Addended by: Wardell Heath on: 09/24/2016 10:10 AM   Modules accepted: Orders

## 2016-10-01 ENCOUNTER — Other Ambulatory Visit: Payer: Self-pay | Admitting: *Deleted

## 2016-10-01 MED ORDER — CHLORTHALIDONE 25 MG PO TABS: ORAL_TABLET | ORAL | 1 refills | Status: DC

## 2016-10-01 MED ORDER — CLONIDINE HCL 0.1 MG PO TABS: ORAL_TABLET | ORAL | 1 refills | Status: DC

## 2016-10-07 ENCOUNTER — Other Ambulatory Visit: Payer: Self-pay | Admitting: *Deleted

## 2016-10-27 ENCOUNTER — Encounter (HOSPITAL_COMMUNITY): Payer: Self-pay | Admitting: *Deleted

## 2016-10-27 ENCOUNTER — Emergency Department (HOSPITAL_COMMUNITY): Payer: Medicare Other

## 2016-10-27 ENCOUNTER — Inpatient Hospital Stay (HOSPITAL_COMMUNITY)
Admission: EM | Admit: 2016-10-27 | Discharge: 2016-10-30 | DRG: 065 | Disposition: A | Discharge status: Rehabilitation Facility | Payer: Medicare Other | Attending: Internal Medicine | Admitting: Internal Medicine

## 2016-10-27 DIAGNOSIS — I169 Hypertensive crisis, unspecified: Secondary | ICD-10-CM | POA: Diagnosis not present

## 2016-10-27 DIAGNOSIS — Z89511 Acquired absence of right leg below knee: Secondary | ICD-10-CM

## 2016-10-27 DIAGNOSIS — R531 Weakness: Secondary | ICD-10-CM | POA: Diagnosis not present

## 2016-10-27 DIAGNOSIS — I639 Cerebral infarction, unspecified: Secondary | ICD-10-CM | POA: Diagnosis not present

## 2016-10-27 DIAGNOSIS — R29706 NIHSS score 6: Secondary | ICD-10-CM | POA: Diagnosis present

## 2016-10-27 DIAGNOSIS — R4781 Slurred speech: Secondary | ICD-10-CM | POA: Diagnosis not present

## 2016-10-27 DIAGNOSIS — E1151 Type 2 diabetes mellitus with diabetic peripheral angiopathy without gangrene: Secondary | ICD-10-CM | POA: Diagnosis present

## 2016-10-27 DIAGNOSIS — G8194 Hemiplegia, unspecified affecting left nondominant side: Secondary | ICD-10-CM | POA: Diagnosis not present

## 2016-10-27 DIAGNOSIS — I252 Old myocardial infarction: Secondary | ICD-10-CM

## 2016-10-27 DIAGNOSIS — R7303 Prediabetes: Secondary | ICD-10-CM

## 2016-10-27 DIAGNOSIS — W19XXXA Unspecified fall, initial encounter: Secondary | ICD-10-CM | POA: Diagnosis present

## 2016-10-27 DIAGNOSIS — R066 Hiccough: Secondary | ICD-10-CM

## 2016-10-27 DIAGNOSIS — I6339 Cerebral infarction due to thrombosis of other cerebral artery: Secondary | ICD-10-CM | POA: Diagnosis not present

## 2016-10-27 DIAGNOSIS — Z9049 Acquired absence of other specified parts of digestive tract: Secondary | ICD-10-CM

## 2016-10-27 DIAGNOSIS — Y92009 Unspecified place in unspecified non-institutional (private) residence as the place of occurrence of the external cause: Secondary | ICD-10-CM

## 2016-10-27 DIAGNOSIS — R29810 Facial weakness: Secondary | ICD-10-CM | POA: Diagnosis not present

## 2016-10-27 DIAGNOSIS — Z8249 Family history of ischemic heart disease and other diseases of the circulatory system: Secondary | ICD-10-CM

## 2016-10-27 DIAGNOSIS — E785 Hyperlipidemia, unspecified: Secondary | ICD-10-CM | POA: Diagnosis not present

## 2016-10-27 DIAGNOSIS — I6529 Occlusion and stenosis of unspecified carotid artery: Secondary | ICD-10-CM

## 2016-10-27 DIAGNOSIS — K219 Gastro-esophageal reflux disease without esophagitis: Secondary | ICD-10-CM | POA: Diagnosis present

## 2016-10-27 DIAGNOSIS — I509 Heart failure, unspecified: Secondary | ICD-10-CM | POA: Diagnosis present

## 2016-10-27 DIAGNOSIS — I110 Hypertensive heart disease with heart failure: Secondary | ICD-10-CM | POA: Diagnosis present

## 2016-10-27 DIAGNOSIS — Z955 Presence of coronary angioplasty implant and graft: Secondary | ICD-10-CM

## 2016-10-27 DIAGNOSIS — G8104 Flaccid hemiplegia affecting left nondominant side: Secondary | ICD-10-CM

## 2016-10-27 DIAGNOSIS — N390 Urinary tract infection, site not specified: Secondary | ICD-10-CM | POA: Diagnosis present

## 2016-10-27 DIAGNOSIS — Z79899 Other long term (current) drug therapy: Secondary | ICD-10-CM

## 2016-10-27 DIAGNOSIS — Z951 Presence of aortocoronary bypass graft: Secondary | ICD-10-CM

## 2016-10-27 DIAGNOSIS — I6789 Other cerebrovascular disease: Secondary | ICD-10-CM | POA: Diagnosis not present

## 2016-10-27 DIAGNOSIS — Z86010 Personal history of colonic polyps: Secondary | ICD-10-CM

## 2016-10-27 DIAGNOSIS — I10 Essential (primary) hypertension: Secondary | ICD-10-CM | POA: Diagnosis not present

## 2016-10-27 DIAGNOSIS — Z888 Allergy status to other drugs, medicaments and biological substances status: Secondary | ICD-10-CM

## 2016-10-27 DIAGNOSIS — T17308A Unspecified foreign body in larynx causing other injury, initial encounter: Secondary | ICD-10-CM

## 2016-10-27 DIAGNOSIS — I6523 Occlusion and stenosis of bilateral carotid arteries: Secondary | ICD-10-CM | POA: Diagnosis present

## 2016-10-27 DIAGNOSIS — E876 Hypokalemia: Secondary | ICD-10-CM | POA: Diagnosis present

## 2016-10-27 DIAGNOSIS — E1159 Type 2 diabetes mellitus with other circulatory complications: Secondary | ICD-10-CM | POA: Diagnosis not present

## 2016-10-27 DIAGNOSIS — Z8711 Personal history of peptic ulcer disease: Secondary | ICD-10-CM

## 2016-10-27 DIAGNOSIS — I2510 Atherosclerotic heart disease of native coronary artery without angina pectoris: Secondary | ICD-10-CM | POA: Diagnosis present

## 2016-10-27 DIAGNOSIS — Z7982 Long term (current) use of aspirin: Secondary | ICD-10-CM

## 2016-10-27 DIAGNOSIS — R0989 Other specified symptoms and signs involving the circulatory and respiratory systems: Secondary | ICD-10-CM | POA: Diagnosis not present

## 2016-10-27 DIAGNOSIS — Z87442 Personal history of urinary calculi: Secondary | ICD-10-CM

## 2016-10-27 DIAGNOSIS — Z833 Family history of diabetes mellitus: Secondary | ICD-10-CM

## 2016-10-27 DIAGNOSIS — Z87891 Personal history of nicotine dependence: Secondary | ICD-10-CM

## 2016-10-27 DIAGNOSIS — I25810 Atherosclerosis of coronary artery bypass graft(s) without angina pectoris: Secondary | ICD-10-CM | POA: Diagnosis not present

## 2016-10-27 DIAGNOSIS — I63231 Cerebral infarction due to unspecified occlusion or stenosis of right carotid arteries: Secondary | ICD-10-CM

## 2016-10-27 DIAGNOSIS — Z8261 Family history of arthritis: Secondary | ICD-10-CM

## 2016-10-27 DIAGNOSIS — Z7984 Long term (current) use of oral hypoglycemic drugs: Secondary | ICD-10-CM

## 2016-10-27 LAB — DIFFERENTIAL
BASOS ABS: 0 10*3/uL (ref 0.0–0.1)
Basophils Relative: 0 %
EOS ABS: 0.3 10*3/uL (ref 0.0–0.7)
EOS PCT: 4 %
LYMPHS ABS: 1.3 10*3/uL (ref 0.7–4.0)
LYMPHS PCT: 17 %
Monocytes Absolute: 1.1 10*3/uL — ABNORMAL HIGH (ref 0.1–1.0)
Monocytes Relative: 14 %
NEUTROS PCT: 65 %
Neutro Abs: 5 10*3/uL (ref 1.7–7.7)

## 2016-10-27 LAB — CBC
HCT: 41.1 % (ref 39.0–52.0)
HEMOGLOBIN: 14.6 g/dL (ref 13.0–17.0)
MCH: 33.6 pg (ref 26.0–34.0)
MCHC: 35.5 g/dL (ref 30.0–36.0)
MCV: 94.5 fL (ref 78.0–100.0)
PLATELETS: 238 10*3/uL (ref 150–400)
RBC: 4.35 MIL/uL (ref 4.22–5.81)
RDW: 13.1 % (ref 11.5–15.5)
WBC: 7.7 10*3/uL (ref 4.0–10.5)

## 2016-10-27 LAB — COMPREHENSIVE METABOLIC PANEL
ALBUMIN: 3.6 g/dL (ref 3.5–5.0)
ALT: 32 U/L (ref 17–63)
ANION GAP: 11 (ref 5–15)
AST: 47 U/L — AB (ref 15–41)
Alkaline Phosphatase: 27 U/L — ABNORMAL LOW (ref 38–126)
BILIRUBIN TOTAL: 0.7 mg/dL (ref 0.3–1.2)
BUN: 11 mg/dL (ref 6–20)
CHLORIDE: 101 mmol/L (ref 101–111)
CO2: 25 mmol/L (ref 22–32)
Calcium: 9.4 mg/dL (ref 8.9–10.3)
Creatinine, Ser: 0.94 mg/dL (ref 0.61–1.24)
GFR calc Af Amer: >60 mL/min (ref >60)
GFR calc non Af Amer: >60 mL/min (ref >60)
GLUCOSE: 121 mg/dL — AB (ref 65–99)
POTASSIUM: 3.1 mmol/L — AB (ref 3.5–5.1)
SODIUM: 137 mmol/L (ref 135–145)
Total Protein: 7 g/dL (ref 6.5–8.1)

## 2016-10-27 LAB — ETHANOL: Alcohol, Ethyl (B): <5 mg/dL (ref <5)

## 2016-10-27 LAB — CBG MONITORING, ED: GLUCOSE-CAPILLARY: 117 mg/dL — AB (ref 65–99)

## 2016-10-27 LAB — I-STAT CHEM 8, ED
BUN: 13 mg/dL (ref 6–20)
CREATININE: 1 mg/dL (ref 0.61–1.24)
Calcium, Ion: 1.08 mmol/L — ABNORMAL LOW (ref 1.15–1.40)
Chloride: 100 mmol/L — ABNORMAL LOW (ref 101–111)
Glucose, Bld: 126 mg/dL — ABNORMAL HIGH (ref 65–99)
HEMATOCRIT: 40 % (ref 39.0–52.0)
Hemoglobin: 13.6 g/dL (ref 13.0–17.0)
POTASSIUM: 3 mmol/L — AB (ref 3.5–5.1)
SODIUM: 138 mmol/L (ref 135–145)
TCO2: 26 mmol/L (ref 0–100)

## 2016-10-27 LAB — APTT: APTT: 40 s — AB (ref 24–36)

## 2016-10-27 LAB — I-STAT TROPONIN, ED: Troponin i, poc: 0.02 ng/mL (ref 0.00–0.08)

## 2016-10-27 LAB — PROTIME-INR
INR: 0.99
PROTHROMBIN TIME: 13.1 s (ref 11.4–15.2)

## 2016-10-27 MED ORDER — INSULIN ASPART 100 UNIT/ML ~~LOC~~ SOLN
0.0000 [IU] | SUBCUTANEOUS | Status: DC
  Administered 2016-10-28: 2 [IU] via SUBCUTANEOUS
  Administered 2016-10-28 – 2016-10-29 (×5): 1 [IU] via SUBCUTANEOUS

## 2016-10-27 MED ORDER — PROMETHAZINE HCL 25 MG/ML IJ SOLN: 25.0000 mg | Freq: Four times a day (QID) | INTRAMUSCULAR | Status: DC | PRN

## 2016-10-27 MED ORDER — ONDANSETRON HCL 4 MG/2ML IJ SOLN
4.0000 mg | Freq: Once | INTRAMUSCULAR | Status: AC
  Administered 2016-10-27: 4 mg via INTRAVENOUS

## 2016-10-27 MED ORDER — ATORVASTATIN CALCIUM 40 MG PO TABS: 40.0000 mg | ORAL_TABLET | Freq: Every day | ORAL | Status: DC

## 2016-10-27 MED ORDER — ASPIRIN 325 MG PO TABS
325.0000 mg | ORAL_TABLET | Freq: Every day | ORAL | Status: DC
  Administered 2016-10-29 – 2016-10-30 (×2): 325 mg via ORAL
  Filled 2016-10-27 (×2): qty 1

## 2016-10-27 MED ORDER — STROKE: EARLY STAGES OF RECOVERY BOOK
Freq: Once | Status: DC
  Filled 2016-10-27: qty 1

## 2016-10-27 MED ORDER — SODIUM CHLORIDE 0.9 % IV SOLN
INTRAVENOUS | Status: DC
  Administered 2016-10-28: via INTRAVENOUS

## 2016-10-27 MED ORDER — MORPHINE SULFATE (PF) 4 MG/ML IV SOLN
1.0000 mg | INTRAVENOUS | Status: DC | PRN
  Administered 2016-10-28: 1 mg via INTRAVENOUS
  Filled 2016-10-27: qty 1

## 2016-10-27 MED ORDER — FAMOTIDINE IN NACL 20-0.9 MG/50ML-% IV SOLN
20.0000 mg | Freq: Once | INTRAVENOUS | Status: AC
  Administered 2016-10-27: 20 mg via INTRAVENOUS
  Filled 2016-10-27: qty 50

## 2016-10-27 MED ORDER — ASPIRIN 300 MG RE SUPP
300.0000 mg | Freq: Every day | RECTAL | Status: DC
  Administered 2016-10-28: 300 mg via RECTAL
  Filled 2016-10-27 (×2): qty 1

## 2016-10-27 MED ORDER — ENOXAPARIN SODIUM 40 MG/0.4ML ~~LOC~~ SOLN
40.0000 mg | SUBCUTANEOUS | Status: DC
  Administered 2016-10-28 – 2016-10-30 (×3): 40 mg via SUBCUTANEOUS
  Filled 2016-10-27 (×3): qty 0.4

## 2016-10-27 MED ORDER — ADULT MULTIVITAMIN W/MINERALS CH
1.0000 | ORAL_TABLET | Freq: Every day | ORAL | Status: DC
  Administered 2016-10-29 – 2016-10-30 (×2): 1 via ORAL
  Filled 2016-10-27 (×2): qty 1

## 2016-10-27 MED ORDER — ACETAMINOPHEN 650 MG RE SUPP: 650.0000 mg | RECTAL | Status: DC | PRN

## 2016-10-27 MED ORDER — TAMSULOSIN HCL 0.4 MG PO CAPS
0.8000 mg | ORAL_CAPSULE | Freq: Every day | ORAL | Status: DC
  Administered 2016-10-29 – 2016-10-30 (×2): 0.8 mg via ORAL
  Filled 2016-10-27 (×2): qty 2

## 2016-10-27 MED ORDER — RANITIDINE HCL 150 MG/10ML PO SYRP
150.0000 mg | ORAL_SOLUTION | Freq: Once | ORAL | Status: DC
  Filled 2016-10-27: qty 10

## 2016-10-27 MED ORDER — CALCIUM CARBONATE-VITAMIN D 500-200 MG-UNIT PO TABS
1.0000 | ORAL_TABLET | Freq: Every day | ORAL | Status: DC
  Administered 2016-10-29 – 2016-10-30 (×2): 1 via ORAL
  Filled 2016-10-27 (×2): qty 1

## 2016-10-27 MED ORDER — POTASSIUM CHLORIDE IN NACL 20-0.9 MEQ/L-% IV SOLN
Freq: Once | INTRAVENOUS | Status: DC
  Filled 2016-10-27: qty 1000

## 2016-10-27 MED ORDER — DIPHENHYDRAMINE HCL 25 MG PO CAPS: 25.0000 mg | ORAL_CAPSULE | Freq: Every evening | ORAL | Status: DC | PRN

## 2016-10-27 MED ORDER — PROMETHAZINE HCL 25 MG/ML IJ SOLN
12.5000 mg | Freq: Four times a day (QID) | INTRAMUSCULAR | Status: DC | PRN
  Administered 2016-10-28: 12.5 mg via INTRAVENOUS
  Filled 2016-10-27 (×2): qty 1

## 2016-10-27 MED ORDER — DOXYLAMINE SUCCINATE (SLEEP) 25 MG PO TABS: 25.0000 mg | ORAL_TABLET | Freq: Every day | ORAL | Status: DC

## 2016-10-27 MED ORDER — LABETALOL HCL 5 MG/ML IV SOLN: 5.0000 mg | INTRAVENOUS | Status: AC | PRN

## 2016-10-27 MED ORDER — SENNOSIDES-DOCUSATE SODIUM 8.6-50 MG PO TABS: 1.0000 | ORAL_TABLET | Freq: Every evening | ORAL | Status: DC | PRN

## 2016-10-27 MED ORDER — FAMOTIDINE IN NACL 20-0.9 MG/50ML-% IV SOLN
20.0000 mg | Freq: Two times a day (BID) | INTRAVENOUS | Status: DC
  Administered 2016-10-28 – 2016-10-30 (×5): 20 mg via INTRAVENOUS
  Filled 2016-10-27 (×5): qty 50

## 2016-10-27 MED ORDER — ONDANSETRON HCL 4 MG/2ML IJ SOLN
4.0000 mg | Freq: Four times a day (QID) | INTRAMUSCULAR | Status: DC | PRN
  Administered 2016-10-28: 4 mg via INTRAVENOUS
  Filled 2016-10-27: qty 2

## 2016-10-27 MED ORDER — FINASTERIDE 5 MG PO TABS
5.0000 mg | ORAL_TABLET | Freq: Every day | ORAL | Status: DC
  Administered 2016-10-29 – 2016-10-30 (×2): 5 mg via ORAL
  Filled 2016-10-27 (×2): qty 1

## 2016-10-27 MED ORDER — ASPIRIN 300 MG RE SUPP
300.0000 mg | Freq: Once | RECTAL | Status: AC
  Administered 2016-10-28: 300 mg via RECTAL
  Filled 2016-10-27: qty 1

## 2016-10-27 NOTE — ED Provider Notes (Signed)
Patient has fallen twice over the past 2 days. Code stroke was called in the field immediately canceled by me upon his arrival here, as symptoms were onset 2 days ago. He reports difficulty with walking and left arm weakness for the past 2 days. He presents today as he could not get up off the ground after falling. No injury. On exam patient is alert Glasgow Coma Score 15 HEENT exam there is a left mouth droop neck supple trachea midline heart regular rate and rhythm abdomen obese, nontender neurologic left-sided central cranial nerve VII deficit other cranial nerves II through XII grossly intact. Motor strength left upper extremity 3/5 left lower extremity 45 right upper extremity and right lower extremity 5 over 5.   Orlie Dakin, MD 10/28/16 [phone]

## 2016-10-27 NOTE — H&P (Signed)
History and Physical    [name] Cook [id number] DOB: 1941/07/30 DOA: 10/27/2016  PCP: Wardell Honour, MD   Patient coming from: Home  Chief Complaint: left-sided weakness, fall at home  HPI: [name] Cook is a 75 y.o. male with medical history significant for coronary artery disease status post CABG and subsequent graft stent, type 2 diabetes mellitus, hypertension, hyperlipidemia, and GERD who presents to the ED with weakness involving the left face, left arm, and left leg. Patient reports that he had been in his usual state of health until he fell at home this morning at approximately 9 AM. He had difficulty getting up from the floor, noting that his left arm and leg were weak. He later realized that his left face was also weak and drooping. He denies any associated chest pain or palpitations and denies any recent headaches, change in vision or hearing, loss of coordination, or numbness. He denies striking his head when he fell and denies ever experiencing similar symptoms previously. There has been no recent fevers or chills, no chest pain or palpitations, and no cough or dyspnea.  ED Course: Upon arrival to the ED, patient is found to be saturating well on room air, hypertensive to the 190/80 range, and with vitals otherwise stable. EKG demonstrates a sinus rhythm with chronic left bundle branch block and noncontrast head CT reveals a new focal hypodensity in the right caudate, likely representing a subacute ischemic infarct. Chemistry panel is notable for a potassium of 3.1 and CBC is unremarkable. Ethanol level was undetectable, INR is within the normal limits, and troponin is also normal at 0.02. Neurology was consulted by the ED physician and he advises a medical admission for further evaluation and management of a suspected subacute ischemic CVA.  Review of Systems:  All other systems reviewed and apart from HPI, are negative.  Past Medical History:  Diagnosis Date  .  Arthritis   . CAD (coronary artery disease)    2014 75% left main stenosis, LAD 80% followed by a mid subtotal stenosis, circumflex 90% stenosis, right coronary artery occluded, SVG to RCA occluded, SVG ramus occluded, SVG to OM patent without a sequential anastomosis noted. Graft had a focal 99% stenosis which was stented. 3.5 x 16 mm premier. b. cath 10/13/2014 DES to SVG to OM overlap previous stent  . Cholelithiasis 2014  . Cogan syndrome   . Colon polyps   . Diabetes mellitus without complication ([place])   . Dyslipidemia   . Gallstones   . GERD (gastroesophageal reflux disease)   . Hepatic hemangioma 2014  . Hx of right BKA ([place])    a. 2/2 necrotizing fasciitis.  . Hypertension   . Kidney stones   . Myocardial infarction 1999  . Necrotizing fasciitis ([place])   . Peptic ulcer disease   . Peripheral vascular disease [facility])     Past Surgical History:  Procedure Laterality Date  . APPENDECTOMY  2007  . CORONARY ARTERY BYPASS GRAFT  1999   LIMA to the LAD, saphenous vein graft to obtuse marginal 1 and obtuse marginal 2, spahenous vein graft to intermediate, spahenous vein graft to the right coronary artery in 1999  . LEFT HEART CATHETERIZATION WITH CORONARY ANGIOGRAM N/A 10/13/2014   Procedure: LEFT HEART CATHETERIZATION WITH CORONARY ANGIOGRAM;  Surgeon: Peter M Martinique, MD;  Location: [facility] CATH LAB;  Service: Cardiovascular;  Laterality: N/A;  . LEFT HEART CATHETERIZATION WITH CORONARY/GRAFT ANGIOGRAM N/A 07/19/2013   Procedure: LEFT HEART CATHETERIZATION WITH Beatrix Fetters;  Surgeon: Collier Salina  M Martinique, MD;  Location: [facility] CATH LAB;  Service: Cardiovascular;  Laterality: N/A;  . LEG AMPUTATION  1999   Right BKA  . PERCUTANEOUS CORONARY STENT INTERVENTION (PCI-S)  07/19/2013   Procedure: PERCUTANEOUS CORONARY STENT INTERVENTION (PCI-S);  Surgeon: Peter M Martinique, MD;  Location: [facility] CATH LAB;  Service: Cardiovascular;;     reports that he quit smoking about 18 years ago. His smoking use  included Cigarettes. He started smoking about 53 years ago. He has never used smokeless tobacco. He reports that he drinks about 8.4 oz of alcohol per week . He reports that he does not use drugs.  Allergies  Allergen Reactions  . Altace [Ramipril] Other (See Comments)    hypotension    Family History  Problem Relation Age of Onset  . Hyperlipidemia Mother   . Hypertension Mother   . Other Mother     varicose veins  . Heart disease Mother   . Rheum arthritis Father   . Heart attack Father   . Heart disease Maternal Grandfather   . Diabetes Sister   . Colon cancer Neg Hx      Prior to Admission medications   Medication Sig Start Date End Date Taking? Authorizing Provider  acetaminophen (TYLENOL) 500 MG tablet Take 1,000 mg by mouth every 6 (six) hours as needed for mild pain.    Yes Historical Provider, MD  amLODipine (NORVASC) 5 MG tablet Take 1 tablet by mouth  daily 08/16/15  Yes Minus Breeding, MD  aspirin 81 MG tablet Take 81 mg by mouth daily.     Yes Historical Provider, MD  atorvastatin (LIPITOR) 40 MG tablet Take 1 tablet (40 mg total) by mouth daily. 09/05/14  Yes Minus Breeding, MD  Calcium Carbonate-Vitamin D (CALCIUM-CARB 600 + D) 600-125 MG-UNIT TABS Take 1 tablet by mouth daily.     Yes Historical Provider, MD  chlorthalidone (HYGROTON) 25 MG tablet TAKE 1 TABLET (25 MG TOTAL) BY MOUTH DAILY. 10/01/16  Yes Wardell Honour, MD  cloNIDine (CATAPRES) 0.1 MG tablet TAKE 1 TABLET AS NEEDED FOR BLOOD PRESSURES GREATER THAN 160/100 10/01/16  Yes Wardell Honour, MD  doxylamine, Sleep, (UNISOM) 25 MG tablet Take 25 mg by mouth at bedtime.   Yes Historical Provider, MD  finasteride (PROSCAR) 5 MG tablet Take 5 mg by mouth daily.   Yes Historical Provider, MD  lisinopril (PRINIVIL,ZESTRIL) 20 MG tablet TAKE 1 TABLET (20 MG TOTAL) BY MOUTH DAILY. 09/09/16  Yes Wardell Honour, MD  metoprolol succinate (TOPROL-XL) 50 MG 24 hr tablet TAKE 1 TABLET BY MOUTH DAILY WITH OR  IMMEDIATELY FOLLOWING A MEAL 09/09/16  Yes Wardell Honour, MD  Multiple Vitamin (MULTIVITAMIN) tablet Take 1 tablet by mouth daily.     Yes Historical Provider, MD  nitroGLYCERIN (NITROSTAT) 0.4 MG SL tablet Place 1 tablet (0.4 mg total) under the tongue every 5 (five) minutes as needed for chest pain. 02/08/15  Yes Minus Breeding, MD  Probiotic Product (RESTORA) CAPS Take 1 capsule by mouth daily. 08/17/13  Yes Jerene Bears, MD  ranitidine (ZANTAC) 75 MG tablet Take 75-150 mg by mouth See admin instructions. 75 mg in the am, 150 mg at bedtime   Yes Historical Provider, MD  tamsulosin (FLOMAX) 0.4 MG CAPS Take 0.8 mg by mouth daily.    Yes Historical Provider, MD  EFFIENT 10 MG TABS tablet Take 1 tablet by mouth  daily Patient not taking: Reported on 10/27/2016 10/13/15   Minus Breeding, MD  metFORMIN (  GLUCOPHAGE) 500 MG tablet Take 1 tablet by mouth  daily Patient not taking: Reported on 10/27/2016 12/20/14   Wardell Honour, MD  rosuvastatin (CRESTOR) 10 MG tablet Take 1 tablet (10 mg total) by mouth daily. Patient not taking: Reported on 10/27/2016 09/24/16   Wardell Honour, MD    Physical Exam: Vitals:   10/27/16 2130 10/27/16 2230 10/27/16 2300  BP: 193/76 175/77 (!) 207/64  Pulse: 83 (!) 47 81  Resp: 20 21 20   SpO2: 95% 94% 94%      Constitutional: No respiratory distress, actively vomiting non-bloody non-bilious liquid  Eyes: PERTLA, lids and conjunctivae normal ENMT: Mucous membranes are moist. Posterior pharynx clear of any exudate or lesions.   Neck: normal, supple, no masses, no thyromegaly Respiratory: clear to auscultation bilaterally, no wheezing, no crackles. Normal respiratory effort.   Cardiovascular: S1 & S2 heard, regular rate and rhythm. 1+ pitting edema to LLE. No carotid bruits. No significant JVD. Abdomen: No distension, no tenderness, no masses palpated. Bowel sounds normal.  Musculoskeletal: no clubbing / cyanosis. Status-post right BKA. Normal muscle tone.    Skin: no significant rashes, lesions, ulcers. Warm, dry, well-perfused. Neurologic: CN 2-12 grossly intact. Sensation to light touch intact in face and distal extremities, bicipital DTRs normal. Strength 5/5 throughout right arm and proximal right leg. Strength 3-4/5 in LUE and LLE.  Psychiatric: Normal judgment and insight. Alert and oriented x 3. Normal mood and affect.     Labs on Admission: I have personally reviewed following labs and imaging studies  CBC:  Recent Labs Lab 10/27/16 2110 10/27/16 2118  WBC 7.7  --   NEUTROABS 5.0  --   HGB 14.6 13.6  HCT 41.1 40.0  MCV 94.5  --   PLT 238  --    Basic Metabolic Panel:  Recent Labs Lab 10/27/16 2110 10/27/16 2118  NA 137 138  K 3.1* 3.0*  CL 101 100*  CO2 25  --   GLUCOSE 121* 126*  BUN 11 13  CREATININE 0.94 1.00  CALCIUM 9.4  --    GFR: CrCl cannot be calculated (Unknown ideal weight.). Liver Function Tests:  Recent Labs Lab 10/27/16 2110  AST 47*  ALT 32  ALKPHOS 27*  BILITOT 0.7  PROT 7.0  ALBUMIN 3.6   No results for input(s): LIPASE, AMYLASE in the last 168 hours. No results for input(s): AMMONIA in the last 168 hours. Coagulation Profile:  Recent Labs Lab 10/27/16 2110  INR 0.99   Cardiac Enzymes: No results for input(s): CKTOTAL, CKMB, CKMBINDEX, TROPONINI in the last 168 hours. BNP (last 3 results) No results for input(s): PROBNP in the last 8760 hours. HbA1C: No results for input(s): HGBA1C in the last 72 hours. CBG:  Recent Labs Lab 10/27/16 2113  GLUCAP 117*   Lipid Profile: No results for input(s): CHOL, HDL, LDLCALC, TRIG, CHOLHDL, LDLDIRECT in the last 72 hours. Thyroid Function Tests: No results for input(s): TSH, T4TOTAL, FREET4, T3FREE, THYROIDAB in the last 72 hours. Anemia Panel: No results for input(s): VITAMINB12, FOLATE, FERRITIN, TIBC, IRON, RETICCTPCT in the last 72 hours. Urine analysis:    Component Value Date/Time   COLORURINE YELLOW 06/01/2013 1752    APPEARANCEUR TURBID (A) 06/01/2013 1752   LABSPEC 1.014 06/01/2013 1752   PHURINE 7.0 06/01/2013 1752   GLUCOSEU NEGATIVE 06/01/2013 1752   HGBUR LARGE (A) 06/01/2013 1752   BILIRUBINUR NEGATIVE 06/01/2013 1752   KETONESUR NEGATIVE 06/01/2013 1752   PROTEINUR 100 (A) 06/01/2013 1752   UROBILINOGEN  0.2 06/01/2013 1752   NITRITE POSITIVE (A) 06/01/2013 1752   LEUKOCYTESUR LARGE (A) 06/01/2013 1752   Sepsis Labs: @LABRCNTIP (procalcitonin:4,lacticidven:4) )No results found for this or any previous visit (from the past 240 hour(s)).   Radiological Exams on Admission: Ct Head Wo Contrast  Result Date: 10/27/2016 CLINICAL DATA:  Left-sided weakness.  Multiple recent falls. EXAM: CT HEAD WITHOUT CONTRAST TECHNIQUE: Contiguous axial images were obtained from the base of the skull through the vertex without intravenous contrast. COMPARISON:  01/25/2016 FINDINGS: Brain: Negative for acute intracranial hemorrhage or extra-axial fluid collection. There is focal hypodensity in the right caudate extending into the right lentiform nuclei, likely subacute infarction. No significant mass effect. Unchanged ventriculomegaly. Remote left corona radiata infarction, unchanged. Probable remote cerebellar infarctions, unchanged. Vascular: No hyperdense vessel or unexpected calcification. Skull: Normal. Negative for fracture or focal lesion. Sinuses/Orbits: No acute finding. IMPRESSION: 1. New focal hypodensity measuring approximately 2.5 cm in the right caudate, extending into the right lentiform nuclei, likely subacute infarction. No mass effect or midline shift. Recommend brain MRI to characterize. 2. Unchanged ventriculomegaly and remote infarctions involving the left corona radiata and both cerebellar hemispheres. Electronically Signed   By: Andreas Newport M.D.   On: 10/27/2016 23:17    EKG: Independently reviewed. Sinus rhythm with chronic LBBB, no significant change from prior  Assessment/Plan  1. Ischemic  CVA with left-sided weakness  - Pt presents with weakness involving left face, LUE, and LLE - CT head reveals apparent ischemic infarction in right caudate, extending into lentiform nuclei; no hemorrhage or mass-effect evident  - tPA not considered as pt presented outside the acceptable timeframe  - Risk factors for CVA include DM, HTN, HLD, and ASCVD  - Plan to observe on telemetry, obtain MRI/MRA brain, carotid dopplers, TTE, A1c, fasting lipids  - Frequent neuro checks; PT, OT, and SLP evals requested; pt has failed swallow eval and will be kept NPO for now; he is vomiting and will be placed on aspiration precautions; he was given suction wand and shown how to use  - Maintain euvolemia, euglycemia, normothermia; permit HTN up to [id number] for now  - Neurology is consulting and much appreciated   2. Coronary artery disease  - There are no anginal complaints on admission, EKG not significantly changed from prior, and troponin wnl  - Pt is s/p CABG and underwent subsequent stenting of SVG to OM in November 2015  - He is managed with ASA, Lipitor, Toprol, and lisinopril at home; ASA will be continued PR as stroke ppx, but remaining medications held on admission d/t NPO status and permissive HTN  - Monitoring on telemetry   3. Hypertension  - Pt is markedly hypertensive on admission   - He is managed with chlorthalidone, clonidine, lisinopril, Norvasc, and Toprol at home; these are held on admission  - Treat SBP >220 or DBP >110 with prn labetalol IVPs  - Resume home antihypertensives once outside acute-phase of CVA and appropriate for a diet   4. Hyperlipidemia  - Fasting lipid panel pending  - Lipitor held on admission as pt failed swallow eval; resume when able   5. Type II DM  - A1c was 6.1% in October 2016  - Previously managed with metformin, but on diet-control more recently  - Check CBG q4h while NPO and treat prn with low-intensity SSI  - Update A1c given new CVA; ordered and  pending  6. Hypokalemia  - Serum potassium is 3.1 on admission, possibly secondary to chlorthalidone which is  held - 30 mEq IV potassium given on admission  - Magnesium level added-on to admission labs; replete prn  - Pt is being monitored on telemetry as above   7. GERD - No EGD report on file  - Pt is currently symptomatic and is being treated with IV Pepcid q12h    DVT prophylaxis: sq Lovenox  Code Status: Full  Family Communication: Wife and son updated at bedside at patient's request Disposition Plan: Observe on telemetry Consults called: Neurology Admission status: Observation    Vianne Bulls, MD [facility] Pager [phone]  If 7PM-7AM, please contact night-coverage [email or web address] Password [email or web address]  10/27/2016, 11:57 PM

## 2016-10-27 NOTE — ED Provider Notes (Signed)
[place] DEPT Provider Note   CSN: [id number] Arrival date & time: 10/27/16  2108     History   Chief Complaint Chief Complaint  Patient presents with  . Cerebrovascular Accident    HPI [name] Cook is a 75 y.o. male.   Cerebrovascular Accident  This is a new problem. The current episode started more than 2 days ago. The problem occurs constantly. The problem has not changed since onset.Pertinent negatives include no chest pain, no abdominal pain, no headaches and no shortness of breath. Associated symptoms comments: Left arm weakness, left leg weakness, left facial droop, slurred speech. Nothing aggravates the symptoms. Nothing relieves the symptoms. He has tried nothing for the symptoms. The treatment provided no relief.    Past Medical History:  Diagnosis Date  . Arthritis   . CAD (coronary artery disease)    2014 75% left main stenosis, LAD 80% followed by a mid subtotal stenosis, circumflex 90% stenosis, right coronary artery occluded, SVG to RCA occluded, SVG ramus occluded, SVG to OM patent without a sequential anastomosis noted. Graft had a focal 99% stenosis which was stented. 3.5 x 16 mm premier. b. cath 10/13/2014 DES to SVG to OM overlap previous stent  . Cholelithiasis 2014  . Cogan syndrome   . Colon polyps   . Diabetes mellitus without complication ([place])   . Dyslipidemia   . Gallstones   . GERD (gastroesophageal reflux disease)   . Hepatic hemangioma 2014  . 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  . Hypertension   . Kidney stones   . Myocardial infarction 1999  . Necrotizing fasciitis ([place])   . Peptic ulcer disease   . Peripheral vascular disease [facility])     Patient Active Problem List   Diagnosis Date Noted  . Left-sided weakness 10/27/2016  . Ischemic stroke ([place]) 10/27/2016  . NSTEMI (non-ST elevated myocardial infarction) ([place]) 10/12/2014  . Hx of right BKA ([place]) 10/12/2014  . 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 Atherosclerosis of native arteries of extremity with intermittent claudication ([place]) 12/08/2013  . Diabetes mellitus with circulatory complication ([place])   . Essential hypertension, benign 07/17/2013  . Hypokalemia 07/17/2013  . Non-ST elevation myocardial infarction (NSTEMI), initial episode of care ([place]) 07/16/2013  . GERD (gastroesophageal reflux disease) 03/15/2013  . Peripheral vascular disease ([place]) 03/03/2013  . CAROTID BRUIT 02/06/2011  . Dyslipidemia 10/31/2009  . Essential hypertension 10/31/2009  . Coronary atherosclerosis 10/31/2009    Past Surgical History:  Procedure Laterality Date  . APPENDECTOMY  2007  . CORONARY ARTERY BYPASS GRAFT  1999   LIMA to the LAD, saphenous vein graft to obtuse marginal 1 and obtuse marginal 2, spahenous vein graft to intermediate, spahenous vein graft to the right coronary artery in 1999  . LEFT HEART CATHETERIZATION WITH CORONARY ANGIOGRAM N/A 10/13/2014   Procedure: LEFT HEART CATHETERIZATION WITH CORONARY ANGIOGRAM;  Surgeon: Peter M Martinique, MD;  Location: [facility] CATH LAB;  Service: Cardiovascular;  Laterality: N/A;  . LEFT HEART CATHETERIZATION WITH CORONARY/GRAFT ANGIOGRAM N/A 07/19/2013   Procedure: LEFT HEART CATHETERIZATION WITH Beatrix Fetters;  Surgeon: Peter M Martinique, MD;  Location: [facility] CATH LAB;  Service: Cardiovascular;  Laterality: N/A;  . LEG AMPUTATION  1999   Right BKA  . PERCUTANEOUS CORONARY STENT INTERVENTION (PCI-S)  07/19/2013   Procedure: PERCUTANEOUS CORONARY STENT INTERVENTION (PCI-S);  Surgeon: Peter M Martinique, MD;  Location: [facility] CATH LAB;  Service: Cardiovascular;;  Home Medications    Prior to Admission medications   Medication Sig Start Date End Date Taking? Authorizing Provider  acetaminophen (TYLENOL) 500 MG tablet Take 1,000 mg by mouth every 6 (six) hours as needed for mild pain.    Yes Historical Provider, MD  amLODipine (NORVASC) 5 MG tablet Take 1 tablet by mouth   daily 08/16/15  Yes Minus Breeding, MD  aspirin 81 MG tablet Take 81 mg by mouth daily.     Yes Historical Provider, MD  atorvastatin (LIPITOR) 40 MG tablet Take 1 tablet (40 mg total) by mouth daily. 09/05/14  Yes Minus Breeding, MD  Calcium Carbonate-Vitamin D (CALCIUM-CARB 600 + D) 600-125 MG-UNIT TABS Take 1 tablet by mouth daily.     Yes Historical Provider, MD  chlorthalidone (HYGROTON) 25 MG tablet TAKE 1 TABLET (25 MG TOTAL) BY MOUTH DAILY. 10/01/16  Yes Wardell Honour, MD  cloNIDine (CATAPRES) 0.1 MG tablet TAKE 1 TABLET AS NEEDED FOR BLOOD PRESSURES GREATER THAN 160/100 10/01/16  Yes Wardell Honour, MD  doxylamine, Sleep, (UNISOM) 25 MG tablet Take 25 mg by mouth at bedtime.   Yes Historical Provider, MD  finasteride (PROSCAR) 5 MG tablet Take 5 mg by mouth daily.   Yes Historical Provider, MD  lisinopril (PRINIVIL,ZESTRIL) 20 MG tablet TAKE 1 TABLET (20 MG TOTAL) BY MOUTH DAILY. 09/09/16  Yes Wardell Honour, MD  metoprolol succinate (TOPROL-XL) 50 MG 24 hr tablet TAKE 1 TABLET BY MOUTH DAILY WITH OR IMMEDIATELY FOLLOWING A MEAL 09/09/16  Yes Wardell Honour, MD  Multiple Vitamin (MULTIVITAMIN) tablet Take 1 tablet by mouth daily.     Yes Historical Provider, MD  nitroGLYCERIN (NITROSTAT) 0.4 MG SL tablet Place 1 tablet (0.4 mg total) under the tongue every 5 (five) minutes as needed for chest pain. 02/08/15  Yes Minus Breeding, MD  Probiotic Product (RESTORA) CAPS Take 1 capsule by mouth daily. 08/17/13  Yes Jerene Bears, MD  ranitidine (ZANTAC) 75 MG tablet Take 75-150 mg by mouth See admin instructions. 75 mg in the am, 150 mg at bedtime   Yes Historical Provider, MD  tamsulosin (FLOMAX) 0.4 MG CAPS Take 0.8 mg by mouth daily.    Yes Historical Provider, MD  EFFIENT 10 MG TABS tablet Take 1 tablet by mouth  daily Patient not taking: Reported on 10/27/2016 10/13/15   Minus Breeding, MD  metFORMIN (GLUCOPHAGE) 500 MG tablet Take 1 tablet by mouth  daily Patient not taking: Reported on  10/27/2016 12/20/14   Wardell Honour, MD  rosuvastatin (CRESTOR) 10 MG tablet Take 1 tablet (10 mg total) by mouth daily. Patient not taking: Reported on 10/27/2016 09/24/16   Wardell Honour, MD    Family History Family History  Problem Relation Age of Onset  . Hyperlipidemia Mother   . Hypertension Mother   . Other Mother     varicose veins  . Heart disease Mother   . Rheum arthritis Father   . Heart attack Father   . Heart disease Maternal Grandfather   . Diabetes Sister   . Colon cancer Neg Hx     Social History Social History  Substance Use Topics  . Smoking status: Former Smoker    Types: Cigarettes    Start date: 12/26/1962    Quit date: 11/25/1997  . Smokeless tobacco: Never Used  . Alcohol use 8.4 oz/week    14 Shots of liquor per week     Comment: daily      Allergies  Altace [ramipril]   Review of Systems Review of Systems  Constitutional: Negative for chills and fever.  HENT: Negative for ear pain and sore throat.   Eyes: Negative for pain and visual disturbance.  Respiratory: Negative for cough and shortness of breath.   Cardiovascular: Negative for chest pain and palpitations.  Gastrointestinal: Negative for abdominal pain and vomiting.  Genitourinary: Negative for dysuria and hematuria.  Musculoskeletal: Negative for arthralgias and back pain.  Skin: Negative for color change and rash.  Neurological: Positive for facial asymmetry, speech difficulty and weakness. Negative for seizures, syncope and headaches.  All other systems reviewed and are negative.    Physical Exam Updated Vital Signs BP 193/76   Pulse 83   Resp 20   SpO2 95%   Physical Exam  Constitutional: He is oriented to person, place, and time. He appears well-developed and well-nourished.  HENT:  Head: Normocephalic and atraumatic.  Eyes: Conjunctivae are normal.  Neck: Neck supple.  Cardiovascular: Normal rate and regular rhythm.   No murmur heard. Pulmonary/Chest: Effort  normal and breath sounds normal. No respiratory distress.  Abdominal: Soft. There is no tenderness.  Musculoskeletal: He exhibits no edema.  Neurological: He is alert and oriented to person, place, and time. A cranial nerve deficit is present. No sensory deficit. He exhibits abnormal muscle tone. GCS eye subscore is 4. GCS verbal subscore is 5. GCS motor subscore is 6.  Left-sided lower facial droop. Eye movement intact. 1/5 grip strength 1/5 elbow flexion, 2 out of 5 hip flexion, 4-5 knee flexion, 4/5 dorsiflexion on the left. 5/5 motor strength on the right upper right lower extremity.  Skin: Skin is warm and dry.  Psychiatric: He has a normal mood and affect.  Nursing note and vitals reviewed.    ED Treatments / Results  Labs (all labs ordered are listed, but only abnormal results are displayed) Labs Reviewed  APTT - Abnormal; Notable for the following:       Result Value   aPTT 40 (*)    All other components within normal limits  DIFFERENTIAL - Abnormal; Notable for the following:    Monocytes Absolute 1.1 (*)    All other components within normal limits  COMPREHENSIVE METABOLIC PANEL - Abnormal; Notable for the following:    Potassium 3.1 (*)    Glucose, Bld 121 (*)    AST 47 (*)    Alkaline Phosphatase 27 (*)    All other components within normal limits  I-STAT CHEM 8, ED - Abnormal; Notable for the following:    Potassium 3.0 (*)    Chloride 100 (*)    Glucose, Bld 126 (*)    Calcium, Ion 1.08 (*)    All other components within normal limits  CBG MONITORING, ED - Abnormal; Notable for the following:    Glucose-Capillary 117 (*)    All other components within normal limits  ETHANOL  PROTIME-INR  CBC  RAPID URINE DRUG SCREEN, HOSP PERFORMED  URINALYSIS, ROUTINE W REFLEX MICROSCOPIC (NOT AT [facility])  I-STAT TROPOININ, ED    EKG  EKG Interpretation  Date/Time:  Sunday October 27 2016 21:27:03 EST Ventricular Rate:  85 PR Interval:    QRS Duration: 130 QT  Interval:  392 QTC Calculation: 467 R Axis:   -62 Text Interpretation:  Sinus rhythm Left bundle branch block No significant change since last tracing Confirmed by Winfred Leeds  MD, SAM [phone]) on 10/27/2016 11:10:47 PM       Radiology Ct Head Wo Contrast  Result  Date: 10/27/2016 CLINICAL DATA:  Left-sided weakness.  Multiple recent falls. EXAM: CT HEAD WITHOUT CONTRAST TECHNIQUE: Contiguous axial images were obtained from the base of the skull through the vertex without intravenous contrast. COMPARISON:  01/25/2016 FINDINGS: Brain: Negative for acute intracranial hemorrhage or extra-axial fluid collection. There is focal hypodensity in the right caudate extending into the right lentiform nuclei, likely subacute infarction. No significant mass effect. Unchanged ventriculomegaly. Remote left corona radiata infarction, unchanged. Probable remote cerebellar infarctions, unchanged. Vascular: No hyperdense vessel or unexpected calcification. Skull: Normal. Negative for fracture or focal lesion. Sinuses/Orbits: No acute finding. IMPRESSION: 1. New focal hypodensity measuring approximately 2.5 cm in the right caudate, extending into the right lentiform nuclei, likely subacute infarction. No mass effect or midline shift. Recommend brain MRI to characterize. 2. Unchanged ventriculomegaly and remote infarctions involving the left corona radiata and both cerebellar hemispheres. Electronically Signed   By: Andreas Newport M.D.   On: 10/27/2016 23:17    Procedures Procedures (including critical care time)  Medications Ordered in ED Medications  ranitidine (ZANTAC) 150 MG/10ML syrup 150 mg (not administered)  famotidine (PEPCID) IVPB 20 mg premix (not administered)  aspirin suppository 300 mg (not administered)  0.9 % NaCl with KCl 20 mEq/ L  infusion (not administered)     Initial Impression / Assessment and Plan / ED Course  I have reviewed the triage vital signs and the nursing notes.  Pertinent  labs & imaging results that were available during my care of the patient were reviewed by me and considered in my medical decision making (see chart for details).  Clinical Course     Patient reports with left-sided extremity weakness, says this happened several days ago was then unchanged. Also had some facial droop and slurred speech. Family notes that this slurred speech is getting better. On exam he has the above noted weaknesses in his left upper and lower extremity. No right-sided weakness. No sensation loss. Cranial nerves show a left-sided facial droop with intact sensation otherwise unremarkable. Patient EKG shows normal sinus rhythm without any acute ischemia interval abnormality or arrhythmia. Her remainder of his screening labs are unremarkable for any significant contribution to his presentation. Patient with a CT scan that shows a area of hypodensity concerning for ischemic damage, and the right caudate extending into the lentiform nuclei. This consistent with this presentation. He will be admitted and worked up for stroke.   The inpatient hospitalist team will admit this patient, neurology is consulted, for the remainder this patient's care please see inpatient team notes. Of note he was mildly hypokalemic IV potassium is given as he has failed his swallow study. The patient is also describing some heartburn. EKG is checked again there is no signs of acute ischemia and he is given IV H2 blocker.  Final Clinical Impressions(s) / ED Diagnoses   Final diagnoses:  Cerebral infarction, unspecified mechanism [facility])    New Prescriptions New Prescriptions   No medications on file     Dewaine Conger, MD 10/27/16 [id number]    Orlie Dakin, MD 10/28/16 [id number]

## 2016-10-27 NOTE — ED Notes (Signed)
Unable to do neuro check dioctor admitting at the bedside

## 2016-10-27 NOTE — ED Triage Notes (Signed)
The pt arrived by [facility] from the pts home  The pt fell this am 0900  He did not  Tell his family until just prior to arrival here.  Unable to call code stroke too long.  He was seen br dr Nicole Kindred and examnined  Stroke scale of 6 given by stgroke team.  Rt bk amp  Facial  Droop on the lt .  Lt arm and lt leg weaker  Speech sounds ok but family reports that his speech is not normal  Iv per rnad ems that infiltrated  Another replaced on arrival

## 2016-10-27 NOTE — Consult Note (Signed)
Admission H&P    Chief Complaint: new-onset left-sided weakness.  HPI: [name] Cook is an 75 y.o. male with a history of hypertension, hyperlipidemia, diabetes mellitus, peripheral vascular disease, myocardial infarction and right BK amputation, presenting with new onset left-sided weakness. He was last known well at about  9 AM today. Patient fell at home and was unable to stand and walk. He's been taking aspirin 81 mg per day. CT scan of his head showed no acute intracranial abnormality. NIH stroke score at the time of this evaluation was 6.  LSN: 9:00 AM on 10/27/2016 tPA Given: No: beyond time window for treatment consideration mRankin:  Past Medical History:  Diagnosis Date  . Arthritis   . CAD (coronary artery disease)    2014 75% left main stenosis, LAD 80% followed by a mid subtotal stenosis, circumflex 90% stenosis, right coronary artery occluded, SVG to RCA occluded, SVG ramus occluded, SVG to OM patent without a sequential anastomosis noted. Graft had a focal 99% stenosis which was stented. 3.5 x 16 mm premier. b. cath 10/13/2014 DES to SVG to OM overlap previous stent  . Cholelithiasis 2014  . Cogan syndrome   . Colon polyps   . Diabetes mellitus without complication ([place])   . Dyslipidemia   . Gallstones   . GERD (gastroesophageal reflux disease)   . Hepatic hemangioma 2014  . Hx of right BKA ([place])    a. 2/2 necrotizing fasciitis.  . Hypertension   . Kidney stones   . Myocardial infarction 1999  . Necrotizing fasciitis ([place])   . Peptic ulcer disease   . Peripheral vascular disease [facility])     Past Surgical History:  Procedure Laterality Date  . APPENDECTOMY  2007  . CORONARY ARTERY BYPASS GRAFT  1999   LIMA to the LAD, saphenous vein graft to obtuse marginal 1 and obtuse marginal 2, spahenous vein graft to intermediate, spahenous vein graft to the right coronary artery in 1999  . LEFT HEART CATHETERIZATION WITH CORONARY ANGIOGRAM N/A 10/13/2014   Procedure: LEFT  HEART CATHETERIZATION WITH CORONARY ANGIOGRAM;  Surgeon: Peter M Martinique, MD;  Location: [facility] CATH LAB;  Service: Cardiovascular;  Laterality: N/A;  . LEFT HEART CATHETERIZATION WITH CORONARY/GRAFT ANGIOGRAM N/A 07/19/2013   Procedure: LEFT HEART CATHETERIZATION WITH Beatrix Fetters;  Surgeon: Peter M Martinique, MD;  Location: [facility] CATH LAB;  Service: Cardiovascular;  Laterality: N/A;  . LEG AMPUTATION  1999   Right BKA  . PERCUTANEOUS CORONARY STENT INTERVENTION (PCI-S)  07/19/2013   Procedure: PERCUTANEOUS CORONARY STENT INTERVENTION (PCI-S);  Surgeon: Peter M Martinique, MD;  Location: [facility] CATH LAB;  Service: Cardiovascular;;    Family History  Problem Relation Age of Onset  . Hyperlipidemia Mother   . Hypertension Mother   . Other Mother     varicose veins  . Heart disease Mother   . Rheum arthritis Father   . Heart attack Father   . Heart disease Maternal Grandfather   . Diabetes Sister   . Colon cancer Neg Hx    Social History:  reports that he quit smoking about 18 years ago. His smoking use included Cigarettes. He started smoking about 53 years ago. He has never used smokeless tobacco. He reports that he drinks about 8.4 oz of alcohol per week . He reports that he does not use drugs.  Allergies:  Allergies  Allergen Reactions  . Altace [Ramipril] Other (See Comments)    hypotension    Medications: Preadmission medications were reviewed by me.  ROS: History  obtained from the patient  General ROS: negative for - chills, fatigue, fever, night sweats, weight gain or weight loss Psychological ROS: negative for - behavioral disorder, hallucinations, memory difficulties, mood swings or suicidal ideation Ophthalmic ROS: negative for - blurry vision, double vision, eye pain or loss of vision ENT ROS: negative for - epistaxis, nasal discharge, oral lesions, sore throat, tinnitus or vertigo Allergy and Immunology ROS: negative for - hives or itchy/watery eyes Hematological and  Lymphatic ROS: negative for - bleeding problems, bruising or swollen lymph nodes Endocrine ROS: negative for - galactorrhea, hair pattern changes, polydipsia/polyuria or temperature intolerance Respiratory ROS: negative for - cough, hemoptysis, shortness of breath or wheezing Cardiovascular ROS: negative for - chest pain, dyspnea on exertion, edema or irregular heartbeat Gastrointestinal ROS: negative for - abdominal pain, diarrhea, hematemesis, nausea/vomiting or stool incontinence Genito-Urinary ROS: negative for - dysuria, hematuria, incontinence or urinary frequency/urgency Musculoskeletal ROS: negative for - joint swelling or muscular weakness Neurological ROS: as noted in HPI Dermatological ROS: negative for rash and skin lesion changes  Physical Examination: Blood pressure 193/76, pulse 83, resp. rate 20, SpO2 95 %.  HEENT-  Normocephalic, no lesions, without obvious abnormality.  Normal external eye and conjunctiva.  Normal TM's bilaterally.  Normal auditory canals and external ears. Normal external nose, mucus membranes and septum.  Normal pharynx. Neck supple with no masses, nodes, nodules or enlargement. Cardiovascular - regular rate and rhythm, S1, S2 normal, no murmur, click, rub or gallop Lungs - chest clear, no wheezing, rales, normal symmetric air entry Abdomen - soft, non-tender; bowel sounds normal; no masses,  no organomegaly Extremities - right BK amputation noted  Neurologic Examination: Mental Status: Alert, oriented, no acute distress.  Speech slightly slurred without evidence of aphasia. Able to follow commands without difficulty. Cranial Nerves: II-Visual fields were normal. III/IV/VI-Pupils were equal and reacted normally to light. Extraocular movements were full and conjugate.    V/VII-no facial numbness; moderate left lower facial weakness. VIII-normal. X-normal speech and symmetrical palatal movement. XI: trapezius strength/neck flexion strength normal  bilaterally XII-midline tongue extension with normal strength. Motor: moderate left upper extremity weakness proximally and distally; mild left lower extremity weakness proximally. Sensory: Normal throughout. Deep Tendon Reflexes: 1+ and symmetric. Plantars: mute on the left Cerebellar: Normal finger-to-nose testing with use of right upper extremity; impaired on the left, commensurate with severity of weakness. Carotid auscultation: Normal  Results for orders placed or performed during the hospital encounter of 10/27/16 (from the past 48 hour(s))  Ethanol     Status: None   Collection Time: 10/27/16  9:10 PM  Result Value Ref Range   Alcohol, Ethyl (B) <5 <5 mg/dL    Comment:        LOWEST DETECTABLE LIMIT FOR SERUM ALCOHOL IS 5 mg/dL FOR MEDICAL PURPOSES ONLY   Protime-INR     Status: None   Collection Time: 10/27/16  9:10 PM  Result Value Ref Range   Prothrombin Time 13.1 11.4 - 15.2 seconds   INR 0.99   APTT     Status: Abnormal   Collection Time: 10/27/16  9:10 PM  Result Value Ref Range   aPTT 40 (H) 24 - 36 seconds    Comment:        IF BASELINE aPTT IS ELEVATED, SUGGEST PATIENT RISK ASSESSMENT BE USED TO DETERMINE APPROPRIATE ANTICOAGULANT THERAPY.   CBC     Status: None   Collection Time: 10/27/16  9:10 PM  Result Value Ref Range   WBC 7.7 4.0 -  10.5 K/uL   RBC 4.35 4.22 - 5.81 MIL/uL   Hemoglobin 14.6 13.0 - 17.0 g/dL   HCT 41.1 39.0 - 52.0 %   MCV 94.5 78.0 - 100.0 fL   MCH 33.6 26.0 - 34.0 pg   MCHC 35.5 30.0 - 36.0 g/dL   RDW 13.1 11.5 - 15.5 %   Platelets 238 150 - 400 K/uL  Differential     Status: Abnormal   Collection Time: 10/27/16  9:10 PM  Result Value Ref Range   Neutrophils Relative % 65 %   Neutro Abs 5.0 1.7 - 7.7 K/uL   Lymphocytes Relative 17 %   Lymphs Abs 1.3 0.7 - 4.0 K/uL   Monocytes Relative 14 %   Monocytes Absolute 1.1 (H) 0.1 - 1.0 K/uL   Eosinophils Relative 4 %   Eosinophils Absolute 0.3 0.0 - 0.7 K/uL   Basophils Relative 0  %   Basophils Absolute 0.0 0.0 - 0.1 K/uL  Comprehensive metabolic panel     Status: Abnormal   Collection Time: 10/27/16  9:10 PM  Result Value Ref Range   Sodium 137 135 - 145 mmol/L   Potassium 3.1 (L) 3.5 - 5.1 mmol/L   Chloride 101 101 - 111 mmol/L   CO2 25 22 - 32 mmol/L   Glucose, Bld 121 (H) 65 - 99 mg/dL   BUN 11 6 - 20 mg/dL   Creatinine, Ser 0.94 0.61 - 1.24 mg/dL   Calcium 9.4 8.9 - 10.3 mg/dL   Total Protein 7.0 6.5 - 8.1 g/dL   Albumin 3.6 3.5 - 5.0 g/dL   AST 47 (H) 15 - 41 U/L   ALT 32 17 - 63 U/L   Alkaline Phosphatase 27 (L) 38 - 126 U/L   Total Bilirubin 0.7 0.3 - 1.2 mg/dL   GFR calc non Af Amer >60 >60 mL/min   GFR calc Af Amer >60 >60 mL/min    Comment: (NOTE) The eGFR has been calculated using the CKD EPI equation. This calculation has not been validated in all clinical situations. eGFR's persistently <60 mL/min signify possible Chronic Kidney Disease.    Anion gap 11 5 - 15  CBG monitoring, ED     Status: Abnormal   Collection Time: 10/27/16  9:13 PM  Result Value Ref Range   Glucose-Capillary 117 (H) 65 - 99 mg/dL  I-stat troponin, ED (not at [facility], [facility])     Status: None   Collection Time: 10/27/16  9:16 PM  Result Value Ref Range   Troponin i, poc 0.02 0.00 - 0.08 ng/mL   Comment 3            Comment: Due to the release kinetics of cTnI, a negative result within the first hours of the onset of symptoms does not rule out myocardial infarction with certainty. If myocardial infarction is still suspected, repeat the test at appropriate intervals.   I-Stat Chem 8, ED  (not at [facility], [facility])     Status: Abnormal   Collection Time: 10/27/16  9:18 PM  Result Value Ref Range   Sodium 138 135 - 145 mmol/L   Potassium 3.0 (L) 3.5 - 5.1 mmol/L   Chloride 100 (L) 101 - 111 mmol/L   BUN 13 6 - 20 mg/dL   Creatinine, Ser 1.00 0.61 - 1.24 mg/dL   Glucose, Bld 126 (H) 65 - 99 mg/dL   Calcium, Ion 1.08 (L) 1.15 - 1.40 mmol/L   TCO2 26 0 - 100 mmol/L    Hemoglobin  13.6 13.0 - 17.0 g/dL   HCT 40.0 39.0 - 52.0 %   No results found.  Assessment: 75 y.o. male with multiple risk factors for stroke presenting with acute right cerebral infarction.  Stroke Risk Factors - diabetes mellitus, family history, hyperlipidemia and hypertension  Plan: 1. HgbA1c, fasting lipid panel 2. MRI, MRA  of the brain without contrast 3. PT consult, OT consult, Speech consult 4. Echocardiogram 5. Carotid dopplers 6. Prophylactic therapy-Antiplatelet med: Aspirin  7. Risk factor modification 8. Telemetry monitoring  C.R. Nicole Kindred, MD  [facility] [phone]  10/27/2016, 10:50 PM

## 2016-10-27 NOTE — ED Notes (Signed)
Pt failed  His swallow screen

## 2016-10-28 ENCOUNTER — Observation Stay (HOSPITAL_BASED_OUTPATIENT_CLINIC_OR_DEPARTMENT_OTHER): Payer: Medicare Other

## 2016-10-28 ENCOUNTER — Observation Stay (HOSPITAL_COMMUNITY): Payer: Medicare Other

## 2016-10-28 ENCOUNTER — Encounter (HOSPITAL_COMMUNITY): Payer: Self-pay | Admitting: *Deleted

## 2016-10-28 ENCOUNTER — Encounter (HOSPITAL_COMMUNITY): Payer: Medicare Other

## 2016-10-28 DIAGNOSIS — Z8249 Family history of ischemic heart disease and other diseases of the circulatory system: Secondary | ICD-10-CM | POA: Diagnosis not present

## 2016-10-28 DIAGNOSIS — Z7984 Long term (current) use of oral hypoglycemic drugs: Secondary | ICD-10-CM | POA: Diagnosis not present

## 2016-10-28 DIAGNOSIS — E1151 Type 2 diabetes mellitus with diabetic peripheral angiopathy without gangrene: Secondary | ICD-10-CM | POA: Diagnosis present

## 2016-10-28 DIAGNOSIS — G8102 Flaccid hemiplegia affecting left dominant side: Secondary | ICD-10-CM | POA: Diagnosis not present

## 2016-10-28 DIAGNOSIS — E1159 Type 2 diabetes mellitus with other circulatory complications: Secondary | ICD-10-CM | POA: Diagnosis not present

## 2016-10-28 DIAGNOSIS — Z86010 Personal history of colonic polyps: Secondary | ICD-10-CM | POA: Diagnosis not present

## 2016-10-28 DIAGNOSIS — I25810 Atherosclerosis of coronary artery bypass graft(s) without angina pectoris: Secondary | ICD-10-CM | POA: Diagnosis not present

## 2016-10-28 DIAGNOSIS — Z8261 Family history of arthritis: Secondary | ICD-10-CM | POA: Diagnosis not present

## 2016-10-28 DIAGNOSIS — N179 Acute kidney failure, unspecified: Secondary | ICD-10-CM | POA: Diagnosis present

## 2016-10-28 DIAGNOSIS — I110 Hypertensive heart disease with heart failure: Secondary | ICD-10-CM | POA: Diagnosis present

## 2016-10-28 DIAGNOSIS — D62 Acute posthemorrhagic anemia: Secondary | ICD-10-CM | POA: Diagnosis present

## 2016-10-28 DIAGNOSIS — R1311 Dysphagia, oral phase: Secondary | ICD-10-CM | POA: Diagnosis present

## 2016-10-28 DIAGNOSIS — I252 Old myocardial infarction: Secondary | ICD-10-CM | POA: Diagnosis not present

## 2016-10-28 DIAGNOSIS — N390 Urinary tract infection, site not specified: Secondary | ICD-10-CM | POA: Diagnosis present

## 2016-10-28 DIAGNOSIS — I69322 Dysarthria following cerebral infarction: Secondary | ICD-10-CM | POA: Diagnosis not present

## 2016-10-28 DIAGNOSIS — I10 Essential (primary) hypertension: Secondary | ICD-10-CM | POA: Diagnosis not present

## 2016-10-28 DIAGNOSIS — R066 Hiccough: Secondary | ICD-10-CM | POA: Diagnosis present

## 2016-10-28 DIAGNOSIS — R7303 Prediabetes: Secondary | ICD-10-CM | POA: Diagnosis not present

## 2016-10-28 DIAGNOSIS — I69319 Unspecified symptoms and signs involving cognitive functions following cerebral infarction: Secondary | ICD-10-CM | POA: Diagnosis not present

## 2016-10-28 DIAGNOSIS — I63311 Cerebral infarction due to thrombosis of right middle cerebral artery: Secondary | ICD-10-CM | POA: Diagnosis not present

## 2016-10-28 DIAGNOSIS — Z888 Allergy status to other drugs, medicaments and biological substances status: Secondary | ICD-10-CM | POA: Diagnosis not present

## 2016-10-28 DIAGNOSIS — I63231 Cerebral infarction due to unspecified occlusion or stenosis of right carotid arteries: Secondary | ICD-10-CM | POA: Diagnosis not present

## 2016-10-28 DIAGNOSIS — I6529 Occlusion and stenosis of unspecified carotid artery: Secondary | ICD-10-CM | POA: Diagnosis not present

## 2016-10-28 DIAGNOSIS — I6789 Other cerebrovascular disease: Secondary | ICD-10-CM | POA: Diagnosis not present

## 2016-10-28 DIAGNOSIS — I63212 Cerebral infarction due to unspecified occlusion or stenosis of left vertebral arteries: Secondary | ICD-10-CM | POA: Diagnosis not present

## 2016-10-28 DIAGNOSIS — Z9049 Acquired absence of other specified parts of digestive tract: Secondary | ICD-10-CM | POA: Diagnosis not present

## 2016-10-28 DIAGNOSIS — Z79899 Other long term (current) drug therapy: Secondary | ICD-10-CM | POA: Diagnosis not present

## 2016-10-28 DIAGNOSIS — Z89511 Acquired absence of right leg below knee: Secondary | ICD-10-CM | POA: Diagnosis not present

## 2016-10-28 DIAGNOSIS — I160 Hypertensive urgency: Secondary | ICD-10-CM | POA: Diagnosis not present

## 2016-10-28 DIAGNOSIS — W19XXXA Unspecified fall, initial encounter: Secondary | ICD-10-CM | POA: Diagnosis present

## 2016-10-28 DIAGNOSIS — K219 Gastro-esophageal reflux disease without esophagitis: Secondary | ICD-10-CM | POA: Diagnosis present

## 2016-10-28 DIAGNOSIS — R4189 Other symptoms and signs involving cognitive functions and awareness: Secondary | ICD-10-CM | POA: Diagnosis present

## 2016-10-28 DIAGNOSIS — Z7982 Long term (current) use of aspirin: Secondary | ICD-10-CM | POA: Diagnosis not present

## 2016-10-28 DIAGNOSIS — J9811 Atelectasis: Secondary | ICD-10-CM | POA: Diagnosis not present

## 2016-10-28 DIAGNOSIS — Z951 Presence of aortocoronary bypass graft: Secondary | ICD-10-CM | POA: Diagnosis not present

## 2016-10-28 DIAGNOSIS — I639 Cerebral infarction, unspecified: Secondary | ICD-10-CM | POA: Diagnosis not present

## 2016-10-28 DIAGNOSIS — I6523 Occlusion and stenosis of bilateral carotid arteries: Secondary | ICD-10-CM | POA: Diagnosis not present

## 2016-10-28 DIAGNOSIS — R471 Dysarthria and anarthria: Secondary | ICD-10-CM | POA: Diagnosis present

## 2016-10-28 DIAGNOSIS — R29706 NIHSS score 6: Secondary | ICD-10-CM | POA: Diagnosis present

## 2016-10-28 DIAGNOSIS — R0989 Other specified symptoms and signs involving the circulatory and respiratory systems: Secondary | ICD-10-CM | POA: Diagnosis not present

## 2016-10-28 DIAGNOSIS — Z993 Dependence on wheelchair: Secondary | ICD-10-CM | POA: Diagnosis not present

## 2016-10-28 DIAGNOSIS — E785 Hyperlipidemia, unspecified: Secondary | ICD-10-CM | POA: Diagnosis not present

## 2016-10-28 DIAGNOSIS — Z87442 Personal history of urinary calculi: Secondary | ICD-10-CM | POA: Diagnosis not present

## 2016-10-28 DIAGNOSIS — E876 Hypokalemia: Secondary | ICD-10-CM | POA: Diagnosis not present

## 2016-10-28 DIAGNOSIS — E870 Hyperosmolality and hypernatremia: Secondary | ICD-10-CM | POA: Diagnosis present

## 2016-10-28 DIAGNOSIS — I69352 Hemiplegia and hemiparesis following cerebral infarction affecting left dominant side: Secondary | ICD-10-CM | POA: Diagnosis not present

## 2016-10-28 DIAGNOSIS — I63332 Cerebral infarction due to thrombosis of left posterior cerebral artery: Secondary | ICD-10-CM | POA: Diagnosis not present

## 2016-10-28 DIAGNOSIS — K5901 Slow transit constipation: Secondary | ICD-10-CM | POA: Diagnosis present

## 2016-10-28 DIAGNOSIS — I2510 Atherosclerotic heart disease of native coronary artery without angina pectoris: Secondary | ICD-10-CM | POA: Diagnosis present

## 2016-10-28 DIAGNOSIS — G8194 Hemiplegia, unspecified affecting left nondominant side: Secondary | ICD-10-CM | POA: Diagnosis not present

## 2016-10-28 DIAGNOSIS — Y92009 Unspecified place in unspecified non-institutional (private) residence as the place of occurrence of the external cause: Secondary | ICD-10-CM | POA: Diagnosis not present

## 2016-10-28 DIAGNOSIS — Z955 Presence of coronary angioplasty implant and graft: Secondary | ICD-10-CM | POA: Diagnosis not present

## 2016-10-28 DIAGNOSIS — Z87891 Personal history of nicotine dependence: Secondary | ICD-10-CM | POA: Diagnosis not present

## 2016-10-28 DIAGNOSIS — S0990XA Unspecified injury of head, initial encounter: Secondary | ICD-10-CM | POA: Diagnosis not present

## 2016-10-28 DIAGNOSIS — Z8711 Personal history of peptic ulcer disease: Secondary | ICD-10-CM | POA: Diagnosis not present

## 2016-10-28 DIAGNOSIS — I509 Heart failure, unspecified: Secondary | ICD-10-CM | POA: Diagnosis present

## 2016-10-28 DIAGNOSIS — I69354 Hemiplegia and hemiparesis following cerebral infarction affecting left non-dominant side: Secondary | ICD-10-CM | POA: Diagnosis present

## 2016-10-28 DIAGNOSIS — R414 Neurologic neglect syndrome: Secondary | ICD-10-CM | POA: Diagnosis present

## 2016-10-28 DIAGNOSIS — I169 Hypertensive crisis, unspecified: Secondary | ICD-10-CM | POA: Diagnosis present

## 2016-10-28 DIAGNOSIS — Z833 Family history of diabetes mellitus: Secondary | ICD-10-CM | POA: Diagnosis not present

## 2016-10-28 DIAGNOSIS — R1310 Dysphagia, unspecified: Secondary | ICD-10-CM | POA: Diagnosis not present

## 2016-10-28 LAB — RAPID URINE DRUG SCREEN, HOSP PERFORMED
Amphetamines: NOT DETECTED
Barbiturates: NOT DETECTED
Benzodiazepines: NOT DETECTED
Cocaine: NOT DETECTED
OPIATES: POSITIVE — AB
Tetrahydrocannabinol: NOT DETECTED

## 2016-10-28 LAB — GLUCOSE, CAPILLARY
GLUCOSE-CAPILLARY: 122 mg/dL — AB (ref 65–99)
Glucose-Capillary: 152 mg/dL — ABNORMAL HIGH (ref 65–99)
Glucose-Capillary: 137 mg/dL — ABNORMAL HIGH (ref 65–99)
Glucose-Capillary: 127 mg/dL — ABNORMAL HIGH (ref 65–99)
Glucose-Capillary: 120 mg/dL — ABNORMAL HIGH (ref 65–99)

## 2016-10-28 LAB — URINALYSIS, ROUTINE W REFLEX MICROSCOPIC
Bilirubin Urine: NEGATIVE
Glucose, UA: NEGATIVE mg/dL
Hgb urine dipstick: NEGATIVE
KETONES UR: 15 mg/dL — AB
LEUKOCYTES UA: NEGATIVE
NITRITE: NEGATIVE
PH: 6 (ref 5.0–8.0)
Protein, ur: 30 mg/dL — AB

## 2016-10-28 LAB — BASIC METABOLIC PANEL
ANION GAP: 11 (ref 5–15)
BUN: 11 mg/dL (ref 6–20)
CHLORIDE: 101 mmol/L (ref 101–111)
CO2: 26 mmol/L (ref 22–32)
Calcium: 9.2 mg/dL (ref 8.9–10.3)
Creatinine, Ser: 1.07 mg/dL (ref 0.61–1.24)
GFR calc non Af Amer: >60 mL/min (ref >60)
GLUCOSE: 143 mg/dL — AB (ref 65–99)
POTASSIUM: 3.1 mmol/L — AB (ref 3.5–5.1)
Sodium: 138 mmol/L (ref 135–145)

## 2016-10-28 LAB — MAGNESIUM: MAGNESIUM: 2 mg/dL (ref 1.7–2.4)

## 2016-10-28 LAB — LIPID PANEL
CHOLESTEROL: 251 mg/dL — AB (ref 0–200)
HDL: 43 mg/dL (ref >40)
LDL Cholesterol: 180 mg/dL — ABNORMAL HIGH (ref 0–99)
TRIGLYCERIDES: 139 mg/dL (ref <150)
Total CHOL/HDL Ratio: 5.8 RATIO
VLDL: 28 mg/dL (ref 0–40)

## 2016-10-28 LAB — ECHOCARDIOGRAM COMPLETE
HEIGHTINCHES: 68 in
WEIGHTICAEL: 3244.8 [oz_av]

## 2016-10-28 LAB — URINE MICROSCOPIC-ADD ON

## 2016-10-28 MED ORDER — SODIUM CHLORIDE 0.9 % IV SOLN
30.0000 meq | Freq: Once | INTRAVENOUS | Status: AC
  Administered 2016-10-28: 30 meq via INTRAVENOUS
  Filled 2016-10-28 (×2): qty 15

## 2016-10-28 MED ORDER — CHLORHEXIDINE GLUCONATE 0.12 % MT SOLN
15.0000 mL | Freq: Two times a day (BID) | OROMUCOSAL | Status: DC
  Administered 2016-10-28 – 2016-10-30 (×4): 15 mL via OROMUCOSAL
  Filled 2016-10-28 (×4): qty 15

## 2016-10-28 MED ORDER — ATORVASTATIN CALCIUM 80 MG PO TABS
80.0000 mg | ORAL_TABLET | Freq: Every day | ORAL | Status: DC
  Administered 2016-10-29 – 2016-10-30 (×2): 80 mg via ORAL
  Filled 2016-10-28 (×2): qty 1

## 2016-10-28 MED ORDER — POTASSIUM CHLORIDE IN NACL 40-0.9 MEQ/L-% IV SOLN
INTRAVENOUS | Status: DC
  Administered 2016-10-28: 50 mL/h via INTRAVENOUS
  Filled 2016-10-28 (×2): qty 1000

## 2016-10-28 MED ORDER — IOPAMIDOL (ISOVUE-370) INJECTION 76%
INTRAVENOUS | Status: AC
  Administered 2016-10-28: 50 mL
  Filled 2016-10-28: qty 50

## 2016-10-28 MED ORDER — PROMETHAZINE HCL 25 MG/ML IJ SOLN
12.5000 mg | Freq: Once | INTRAMUSCULAR | Status: AC
  Administered 2016-10-28: 12.5 mg via INTRAVENOUS

## 2016-10-28 MED ORDER — CLOPIDOGREL BISULFATE 75 MG PO TABS
75.0000 mg | ORAL_TABLET | Freq: Every day | ORAL | Status: DC
  Administered 2016-10-29 – 2016-10-30 (×2): 75 mg via ORAL
  Filled 2016-10-28 (×2): qty 1

## 2016-10-28 MED ORDER — ORAL CARE MOUTH RINSE
15.0000 mL | Freq: Two times a day (BID) | OROMUCOSAL | Status: DC
  Administered 2016-10-29 – 2016-10-30 (×4): 15 mL via OROMUCOSAL

## 2016-10-28 NOTE — ED Notes (Signed)
Pt discussed with rn on 5 m

## 2016-10-28 NOTE — Progress Notes (Signed)
PT is recommending IP Rehab.  Patient was screened by Gerlean Ren for appropriateness for an Inpatient Acute Rehab consult.  At this time, we are recommending Inpatient Rehab consult.  Please order consult if you are agreeable.   Admissions Coordinator Cell [phone] Office [phone]

## 2016-10-28 NOTE — Progress Notes (Signed)
PROGRESS NOTE    [name] Cook  [id number] DOB: 16-Dec-1940 DOA: 10/27/2016 PCP: Wardell Honour, MD   Outpatient Specialists:     Brief Narrative:  [name] Cook is a 75 y.o. male with medical history significant for coronary artery disease status post CABG and subsequent graft stent, type 2 diabetes mellitus, hypertension, hyperlipidemia, and GERD who presents to the ED with weakness involving the left face, left arm, and left leg. Patient reports that he had been in his usual state of health until he fell at home this morning at approximately 9 AM. He had difficulty getting up from the floor, noting that his left arm and leg were weak. He later realized that his left face was also weak and drooping. He denies any associated chest pain or palpitations and denies any recent headaches, change in vision or hearing, loss of coordination, or numbness. He denies striking his head when he fell and denies ever experiencing similar symptoms previously. There has been no recent fevers or chills, no chest pain or palpitations, and no cough or dyspnea.   Assessment & Plan:   Principal Problem:   Ischemic stroke [facility]) Active Problems:   Dyslipidemia   Essential hypertension   Coronary atherosclerosis   GERD (gastroesophageal reflux disease)   Hypokalemia   Diabetes mellitus with circulatory complication (HCC)   Left-sided weakness   Acute ischemic stroke (HCC)   CVA (cerebral vascular accident) ([place])   Ischemic CVA with left-sided weakness  - MRI positive -CTA with psuedo anyersym-- vascular consult - Neurology consult -for swallow eval in AM  Coronary artery disease  -- He is managed with ASA, Lipitor, Toprol, and lisinopril at home; ASA will be continued PR as stroke ppx, but remaining medications held on admission d/t NPO status and permissive HTN   Hypertension  - Pt is markedly hypertensive on admission   - He is managed with chlorthalidone, clonidine, lisinopril,  Norvasc, and Toprol at home; these are held on admission  - Treat SBP >220 or DBP >110 with prn labetalol IVPs  - Resume home antihypertensives once outside acute-phase of CVA   Hyperlipidemia  - Fasting lipid panel pending  - Lipitor held on admission as pt failed swallow eval; resume when able    Type II DM  - A1c was 6.1% in October 2016  - Previously managed with metformin, but on diet-control more recently  - Check CBG q4h while NPO and treat prn with low-intensity SSI  - Update A1c  Hypokalemia  - Serum potassium is 3.1 on admission, possibly secondary to chlorthalidone which is held - 30 mEq IV potassium given on admission  - Magnesium ok -change IVF  GERD - No EGD report on file  - Pt is currently symptomatic and is being treated with IV Pepcid q12h     DVT prophylaxis:  Lovenox   Code Status: Full Code   Family Communication:   Disposition Plan:     Consultants:   neuro   Subjective: Slurred speech   Objective: Vitals:   10/28/16 0657 10/28/16 0900 10/28/16 1250 10/28/16 1321  BP: (!) 144/67 (!) 196/78 (!) 164/69 (!) 150/66  Pulse: 98 89 87   Resp: 20 20 16 18   Temp: 98.6 F (37 C) 98.5 F (36.9 C)    TempSrc: Oral Oral Oral   SpO2: 96% 93% 92%   Weight:      Height:        Intake/Output Summary (Last 24 hours) at 10/28/16 1430 Last data filed at  10/28/16 0917  Gross per 24 hour  Intake               50 ml  Output                0 ml  Net               50 ml   Filed Weights   10/28/16 0100  Weight: 92 kg (202 lb 12.8 oz)    Examination:  General exam: leaning to right Respiratory system: Clear to auscultation. Respiratory effort normal. Cardiovascular system: S1 & S2 heard, RRR. No JVD, murmurs, rubs, gallops or clicks. No pedal edema. Gastrointestinal system: Abdomen is nondistended, soft and nontender. No organomegaly or masses felt. Normal bowel sounds heard. Central nervous system: Alert and oriented. No focal  neurological deficits. 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, s/p right amputation   Data Reviewed: I have personally reviewed following labs and imaging studies  CBC:  Recent Labs Lab 10/27/16 2110 10/27/16 2118  WBC 7.7  --   NEUTROABS 5.0  --   HGB 14.6 13.6  HCT 41.1 40.0  MCV 94.5  --   PLT 238  --    Basic Metabolic Panel:  Recent Labs Lab 10/27/16 2110 10/27/16 2118 10/28/16 0146 10/28/16 1147  NA 137 138  --  138  K 3.1* 3.0*  --  3.1*  CL 101 100*  --  101  CO2 25  --   --  26  GLUCOSE 121* 126*  --  143*  BUN 11 13  --  11  CREATININE 0.94 1.00  --  1.07  CALCIUM 9.4  --   --  9.2  MG  --   --  2.0  --    GFR: Estimated Creatinine Clearance: 66.7 mL/min (by C-G formula based on SCr of 1.07 mg/dL). Liver Function Tests:  Recent Labs Lab 10/27/16 2110  AST 47*  ALT 32  ALKPHOS 27*  BILITOT 0.7  PROT 7.0  ALBUMIN 3.6   No results for input(s): LIPASE, AMYLASE in the last 168 hours. No results for input(s): AMMONIA in the last 168 hours. Coagulation Profile:  Recent Labs Lab 10/27/16 2110  INR 0.99   Cardiac Enzymes: No results for input(s): CKTOTAL, CKMB, CKMBINDEX, TROPONINI in the last 168 hours. BNP (last 3 results) No results for input(s): PROBNP in the last 8760 hours. HbA1C: No results for input(s): HGBA1C in the last 72 hours. CBG:  Recent Labs Lab 10/27/16 2113 10/28/16 0204 10/28/16 0416 10/28/16 1319  GLUCAP 117* 152* 137* 127*   Lipid Profile:  Recent Labs  10/28/16 0146  CHOL 251*  HDL 43  LDLCALC 180*  TRIG 139  CHOLHDL 5.8   Thyroid Function Tests: No results for input(s): TSH, T4TOTAL, FREET4, T3FREE, THYROIDAB in the last 72 hours. Anemia Panel: No results for input(s): VITAMINB12, FOLATE, FERRITIN, TIBC, IRON, RETICCTPCT in the last 72 hours. Urine analysis:    Component Value Date/Time   COLORURINE YELLOW 06/01/2013 1752   APPEARANCEUR TURBID (A) 06/01/2013 1752   LABSPEC 1.014 06/01/2013 1752   PHURINE 7.0 06/01/2013  1752   GLUCOSEU NEGATIVE 06/01/2013 1752   HGBUR LARGE (A) 06/01/2013 1752   BILIRUBINUR NEGATIVE 06/01/2013 1752   KETONESUR NEGATIVE 06/01/2013 1752   PROTEINUR 100 (A) 06/01/2013 1752   UROBILINOGEN 0.2 06/01/2013 1752   NITRITE POSITIVE (A) 06/01/2013 1752   LEUKOCYTESUR LARGE (A) 06/01/2013 1752     )No results found for this or any previous visit (from the  past 240 hour(s)).    Anti-infectives    None       Radiology Studies: Ct Angio Head W Or Wo Contrast  Result Date: 10/28/2016 CLINICAL DATA:  75 year old male with right lenticulostriate territory acute infarcts on MRI performed for a left side weakness and fall. Initial encounter. EXAM: CT ANGIOGRAPHY HEAD AND NECK TECHNIQUE: Multidetector CT imaging of the head and neck was performed using the standard protocol during bolus administration of intravenous contrast. Multiplanar CT image reconstructions and MIPs were obtained to evaluate the vascular anatomy. Carotid stenosis measurements (when applicable) are obtained utilizing NASCET criteria, using the distal internal carotid diameter as the denominator. CONTRAST:  50 mL Isovue 370 COMPARISON:  Brain MRI 0956 hours today and head CT 10/27/2016. FINDINGS: CTA NECK Skeleton: Prior median sternotomy. No acute osseous abnormality identified. Visualized paranasal sinuses and mastoids are stable and well pneumatized. Upper chest: Centrilobular emphysema. Mild nonspecific dependent pulmonary opacity. No superior mediastinal lymphadenopathy. Other neck: Negative thyroid, larynx, pharynx, parapharyngeal spaces, retropharyngeal space, sublingual space, submandibular glands and parotid glands. No cervical lymphadenopathy. Negative orbit and scalp soft tissues. Aortic arch: Sequelae of CABG. Extensive arch calcified atherosclerosis. Three vessel arch configuration. Right carotid system: 7 mm diameter pseudoaneurysm along the proximal (right lateral) aspect of the brachia a cephalic artery as  seen on series 8, image 121 and series 5, image 162. No associated stenosis of the proximal brachiocephalic. Soft and calcified plaque of the brachiocephalic artery. No right CCA origin despite soft plaque. Soft and calcified plaque intermittently in the right CCA without stenosis proximal to the right carotid bifurcation. At the bifurcation there is calcified plaque with less than 50 % stenosis with respect to the distal vessel of the proximal right ICA. Calcified plaque in the distal cervical right ICA just below the skullbase also without stenosis. Left carotid system: No left CCA origin stenosis. Intermittent soft and calcified plaque in the left CCA without stenosis proximal to the bifurcation. At the left carotid bifurcation there is bulky in confluent calcified plaque resulting in a Radiographic String Sign Stenosis of the left ICA origin as seen on series 7, image 215 and series 8, image 102. Calcified plaque continues into the left ICA bulb. Negative cervical left ICA otherwise. Vertebral arteries:Soft and calcified plaque in the proximal right subclavian artery without stenosis. Soft plaque at the right vertebral artery origin without hemodynamically significant stenosis (series 7, image 298). Calcified plaque in the right V1 segment, and at the skullbase without associated stenosis. Soft and calcified plaque in the proximal left subclavian artery without hemodynamically significant stenosis. Soft plaque at the left vertebral artery origin without stenosis (series 8, image 146). Intermittent soft and calcified plaque in the left V2 segment with up to mild stenosis. CTA HEAD Posterior circulation: Bilateral distal vertebral artery calcified plaque. No hemodynamically significant stenosis of the distal right vertebral artery, but there is up severe tandem stenosis of the left vertebral artery V4 segment (series 7, image 151) in the left PICA region and at the vertebrobasilar junction. The basilar remains  patent. Both AICA origins appear patent. No basilar stenosis. Mild basilar regularity. Normal SCA and PCA origins. Posterior communicating arteries are diminutive or absent. Mild right PCA irregularity. Severe left PCA P2 segment stenosis best seen on series 10, image 23 with preserved distal enhancement. Anterior circulation: Both ICA siphons are patent, but there is severe calcified siphon atherosclerosis. Still, there is no definite siphon hemodynamically significant stenosis. Normal ophthalmic artery origins. Patent carotid termini. Patent ACA  origins. Mild irregularity and stenosis left ACA A1 segment. Anterior communicating artery and bilateral ACA branches are within normal limits. Left MCA origin and M1 segment are normal. Left MCA bifurcation is patent. Mild irregularity of the left MCA M3 branches. Right MCA origin is patent without stenosis, but there is moderate to severe stenosis in the mid right M1 segment and again at the right MCA bifurcation (series 16, image 26). No right discrete MCA branch occlusion is identified although there are is a paucity of enhancing anterior division right M2 branches (series 12, image 12). Venous sinuses: Patent. Anatomic variants: None. Delayed phase: Mild progression of confluent right basal ganglia hypodensity since the head CT yesterday. No associated hemorrhage or mass effect. Otherwise stable gray-white matter differentiation. No superimposed acute cortically based infarct identified. No abnormal enhancement identified. Review of the MIP images confirms the above findings IMPRESSION: 1. Severe atherosclerosis. Negative for emergent large vessel occlusion. 2. Pseudoaneurysm of the proximal Brachiocephalic Artery (see series 8, image 121). Recommend Vascular Surgery consultation. 3. 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 No discrete right MCA branch occlusion identified although there is a paucity of  enhancing anterior division M2/M3 branches. 4. Extensive Right carotid atherosclerosis without hemodynamically significant stenosis. 5. High-grade RADIOGRAPHIC STRING SIGN STENOSIS of the Left ICA origin due to bulky calcified plaque. 6. 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 7. Severe stenosis Left PCA P2 segment. 8. Expected evolution on CT of the acute right basal ganglia infarcts. No associated hemorrhage or mass effect. 9. Emphysema. Electronically Signed   By: Genevie Ann M.D.   On: 10/28/2016 13:00   Dg Chest 1 View  Result Date: 10/28/2016 CLINICAL DATA:  CVA with left-sided weakness. The patient ports nausea and vomiting. History of diabetes, hypertension, previous CP 80. EXAM: CHEST 1 VIEW COMPARISON:  Chest x-ray of October 12, 2014 FINDINGS: The lungs are less well inflated today. There is patchy density in the right perihilar region. There is no alveolar infiltrate. There is no pleural effusion. The cardiac silhouette is enlarged. There is some crowding of the central pulmonary vascularity. The patient has undergone previous CABG. There is calcification in the wall of the thoracic aorta. The observed bony structures exhibit no acute abnormalities. IMPRESSION: Mild hypo inflation accentuates the lung markings. However, there is likely low-grade CHF with mild interstitial edema. Mildly increased density in the right perihilar region may reflect subsegmental atelectasis. When the patient can tolerate the procedure, a PA and lateral chest x-ray with better inspiratory effort would be useful. Thoracic aortic atherosclerosis. Electronically Signed   By: David  Martinique M.D.   On: 10/28/2016 11:46   Ct Head Wo Contrast  Result Date: 10/27/2016 CLINICAL DATA:  Left-sided weakness.  Multiple recent falls. EXAM: CT HEAD WITHOUT CONTRAST TECHNIQUE: Contiguous axial images were obtained from the base of the skull through the vertex without intravenous contrast. COMPARISON:   01/25/2016 FINDINGS: Brain: Negative for acute intracranial hemorrhage or extra-axial fluid collection. There is focal hypodensity in the right caudate extending into the right lentiform nuclei, likely subacute infarction. No significant mass effect. Unchanged ventriculomegaly. Remote left corona radiata infarction, unchanged. Probable remote cerebellar infarctions, unchanged. Vascular: No hyperdense vessel or unexpected calcification. Skull: Normal. Negative for fracture or focal lesion. Sinuses/Orbits: No acute finding. IMPRESSION: 1. New focal hypodensity measuring approximately 2.5 cm in the right caudate, extending into the right lentiform nuclei, likely subacute infarction. No mass effect  or midline shift. Recommend brain MRI to characterize. 2. Unchanged ventriculomegaly and remote infarctions involving the left corona radiata and both cerebellar hemispheres. Electronically Signed   By: Andreas Newport M.D.   On: 10/27/2016 23:17   Ct Angio Neck W Or Wo Contrast  Result Date: 10/28/2016 CLINICAL DATA:  75 year old male with right lenticulostriate territory acute infarcts on MRI performed for a left side weakness and fall. Initial encounter. EXAM: CT ANGIOGRAPHY HEAD AND NECK TECHNIQUE: Multidetector CT imaging of the head and neck was performed using the standard protocol during bolus administration of intravenous contrast. Multiplanar CT image reconstructions and MIPs were obtained to evaluate the vascular anatomy. Carotid stenosis measurements (when applicable) are obtained utilizing NASCET criteria, using the distal internal carotid diameter as the denominator. CONTRAST:  50 mL Isovue 370 COMPARISON:  Brain MRI 0956 hours today and head CT 10/27/2016. FINDINGS: CTA NECK Skeleton: Prior median sternotomy. No acute osseous abnormality identified. Visualized paranasal sinuses and mastoids are stable and well pneumatized. Upper chest: Centrilobular emphysema. Mild nonspecific dependent pulmonary opacity.  No superior mediastinal lymphadenopathy. Other neck: Negative thyroid, larynx, pharynx, parapharyngeal spaces, retropharyngeal space, sublingual space, submandibular glands and parotid glands. No cervical lymphadenopathy. Negative orbit and scalp soft tissues. Aortic arch: Sequelae of CABG. Extensive arch calcified atherosclerosis. Three vessel arch configuration. Right carotid system: 7 mm diameter pseudoaneurysm along the proximal (right lateral) aspect of the brachia a cephalic artery as seen on series 8, image 121 and series 5, image 162. No associated stenosis of the proximal brachiocephalic. Soft and calcified plaque of the brachiocephalic artery. No right CCA origin despite soft plaque. Soft and calcified plaque intermittently in the right CCA without stenosis proximal to the right carotid bifurcation. At the bifurcation there is calcified plaque with less than 50 % stenosis with respect to the distal vessel of the proximal right ICA. Calcified plaque in the distal cervical right ICA just below the skullbase also without stenosis. Left carotid system: No left CCA origin stenosis. Intermittent soft and calcified plaque in the left CCA without stenosis proximal to the bifurcation. At the left carotid bifurcation there is bulky in confluent calcified plaque resulting in a Radiographic String Sign Stenosis of the left ICA origin as seen on series 7, image 215 and series 8, image 102. Calcified plaque continues into the left ICA bulb. Negative cervical left ICA otherwise. Vertebral arteries:Soft and calcified plaque in the proximal right subclavian artery without stenosis. Soft plaque at the right vertebral artery origin without hemodynamically significant stenosis (series 7, image 298). Calcified plaque in the right V1 segment, and at the skullbase without associated stenosis. Soft and calcified plaque in the proximal left subclavian artery without hemodynamically significant stenosis. Soft plaque at the left  vertebral artery origin without stenosis (series 8, image 146). Intermittent soft and calcified plaque in the left V2 segment with up to mild stenosis. CTA HEAD Posterior circulation: Bilateral distal vertebral artery calcified plaque. No hemodynamically significant stenosis of the distal right vertebral artery, but there is up severe tandem stenosis of the left vertebral artery V4 segment (series 7, image 151) in the left PICA region and at the vertebrobasilar junction. The basilar remains patent. Both AICA origins appear patent. No basilar stenosis. Mild basilar regularity. Normal SCA and PCA origins. Posterior communicating arteries are diminutive or absent. Mild right PCA irregularity. Severe left PCA P2 segment stenosis best seen on series 10, image 23 with preserved distal enhancement. Anterior circulation: Both ICA siphons are patent, but there is severe calcified  siphon atherosclerosis. Still, there is no definite siphon hemodynamically significant stenosis. Normal ophthalmic artery origins. Patent carotid termini. Patent ACA origins. Mild irregularity and stenosis left ACA A1 segment. Anterior communicating artery and bilateral ACA branches are within normal limits. Left MCA origin and M1 segment are normal. Left MCA bifurcation is patent. Mild irregularity of the left MCA M3 branches. Right MCA origin is patent without stenosis, but there is moderate to severe stenosis in the mid right M1 segment and again at the right MCA bifurcation (series 16, image 26). No right discrete MCA branch occlusion is identified although there are is a paucity of enhancing anterior division right M2 branches (series 12, image 12). Venous sinuses: Patent. Anatomic variants: None. Delayed phase: Mild progression of confluent right basal ganglia hypodensity since the head CT yesterday. No associated hemorrhage or mass effect. Otherwise stable gray-white matter differentiation. No superimposed acute cortically based infarct  identified. No abnormal enhancement identified. Review of the MIP images confirms the above findings IMPRESSION: 1. Severe atherosclerosis. Negative for emergent large vessel occlusion. 2. Pseudoaneurysm of the proximal Brachiocephalic Artery (see series 8, image 121). Recommend Vascular Surgery consultation. 3. 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 No discrete right MCA branch occlusion identified although there is a paucity of enhancing anterior division M2/M3 branches. 4. Extensive Right carotid atherosclerosis without hemodynamically significant stenosis. 5. High-grade RADIOGRAPHIC STRING SIGN STENOSIS of the Left ICA origin due to bulky calcified plaque. 6. 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 7. Severe stenosis Left PCA P2 segment. 8. Expected evolution on CT of the acute right basal ganglia infarcts. No associated hemorrhage or mass effect. 9. Emphysema. Electronically Signed   By: Genevie Ann M.D.   On: 10/28/2016 13:00   Mr Brain Wo Contrast  Result Date: 10/28/2016 CLINICAL DATA:  Left-sided weakness.  Fall at home. EXAM: MRI HEAD WITHOUT CONTRAST TECHNIQUE: Multiplanar, multiecho pulse sequences of the brain and surrounding structures were obtained without intravenous contrast. COMPARISON:  Head CT from yesterday FINDINGS: Brain: Restricted diffusion in a wedge-shaped area extending from the right anterior putamen through the anterior limb internal capsule to the caudate head. Distinct second area of acute infarct or posteriorly with the same morphology, extending from putamen to caudate body. No acute hemorrhage. Chronic microvascular disease with moderate ischemic gliosis throughout cerebral white matter. Small remote left frontal infarct at the vertex. There is lateral and third ventriculomegaly that is likely from atrophy. No temporal horn dilatation typical of an obstructive for communicating  hydrocephalus. Vascular: Preserved flow voids.  No focal marrow lesion. Skull and upper cervical spine: Negative Sinuses/Orbits: Negative IMPRESSION: 1. Two acute infarcts in the right lateral lenticulostriate distribution. 2. Chronic microvascular disease and small remote left frontal infarct. 3. Lateral and third ventriculomegaly, favor atrophy over communicating hydrocephalus. Electronically Signed   By: Monte Fantasia M.D.   On: 10/28/2016 11:24        Scheduled Meds: .  stroke: mapping our early stages of recovery book   Does not apply Once  . aspirin  300 mg Rectal Daily   Or  . aspirin  325 mg Oral Daily  . atorvastatin  80 mg Oral q1800  . calcium-vitamin D  1 tablet Oral Daily  . enoxaparin (LOVENOX) injection  40 mg Subcutaneous Q24H  . famotidine (PEPCID) IV  20 mg Intravenous Q12H  . finasteride  5 mg Oral Daily  . insulin aspart  0-9 Units Subcutaneous Q4H  . multivitamin with minerals  1 tablet Oral Daily  . ranitidine (ZANTAC) syrup 15 mg/mL  150 mg Oral Once  . tamsulosin  0.8 mg Oral Daily   Continuous Infusions: . 0.9 % NaCl with KCl 40 mEq / L       LOS: 0 days    Time spent: 35 min    [place], DO [facility] Pager [phone]  If 7PM-7AM, please contact night-coverage [email or web address] Password [email or web address] 10/28/2016, 2:30 PM

## 2016-10-28 NOTE — Progress Notes (Signed)
STROKE TEAM PROGRESS NOTE   HISTORY OF PRESENT ILLNESS (per record) [name] Cook is an 75 y.o. male with a history of hypertension, hyperlipidemia, diabetes mellitus, peripheral vascular disease, myocardial infarction and right BK amputation, presenting with new onset left-sided weakness. He was last known well at about  9 AM today 10/27/2016. Patient fell at home and was unable to stand and walk. He's been taking aspirin 81 mg per day. CT scan of his head showed no acute intracranial abnormality. NIH stroke score at the time of this evaluation was 6. Patient was not administered IV t-PA secondary to beyond time window for treatment consideration. He was admitted for further evaluation and treatment.   SUBJECTIVE (INTERVAL HISTORY) Son at bedside. He still has dense left sided hemiparesis. CTA showed cephalic artery dissection vs. Pseudoaneurysm and left ICA high grade stenosis. VVS consulted.    OBJECTIVE Temp:  [98.5 F (36.9 C)-100 F (37.8 C)] 98.5 F (36.9 C) (12/04 0900) Pulse Rate:  [47-105] 87 (12/04 1250) Cardiac Rhythm: Normal sinus rhythm;Heart block (12/04 1245) Resp:  [16-21] 18 (12/04 1321) BP: (135-207)/(64-78) 150/66 (12/04 1321) SpO2:  [92 %-98 %] 92 % (12/04 1250) FiO2 (%):  [0 %] 0 % (12/03 2351) Weight:  [92 kg (202 lb 12.8 oz)] 92 kg (202 lb 12.8 oz) (12/04 0100)  CBC:   Recent Labs Lab 10/27/16 2110 10/27/16 2118  WBC 7.7  --   NEUTROABS 5.0  --   HGB 14.6 13.6  HCT 41.1 40.0  MCV 94.5  --   PLT 238  --     Basic Metabolic Panel:   Recent Labs Lab 10/27/16 2110 10/27/16 2118 10/28/16 0146 10/28/16 1147  NA 137 138  --  138  K 3.1* 3.0*  --  3.1*  CL 101 100*  --  101  CO2 25  --   --  26  GLUCOSE 121* 126*  --  143*  BUN 11 13  --  11  CREATININE 0.94 1.00  --  1.07  CALCIUM 9.4  --   --  9.2  MG  --   --  2.0  --     Lipid Panel:     Component Value Date/Time   CHOL 251 (H) 10/28/2016 0146   CHOL 256 (H) 09/20/2016 1339   TRIG 139  10/28/2016 0146   HDL 43 10/28/2016 0146   HDL 42 09/20/2016 1339   CHOLHDL 5.8 10/28/2016 0146   VLDL 28 10/28/2016 0146   LDLCALC 180 (H) 10/28/2016 0146   LDLCALC 154 (H) 09/20/2016 1339   HgbA1c:  Lab Results  Component Value Date   HGBA1C 6.1 09/21/2015   Urine Drug Screen: No results found for: LABOPIA, COCAINSCRNUR, LABBENZ, AMPHETMU, THCU, LABBARB    IMAGING I have personally reviewed the radiological images below and agree with the radiology interpretations.  Dg Chest 1 View 10/28/2016 Mild hypo inflation accentuates the lung markings. However, there is likely low-grade CHF with mild interstitial edema. Mildly increased density in the right perihilar region may reflect subsegmental atelectasis. When the patient can tolerate the procedure, a PA and lateral chest x-ray with better inspiratory effort would be useful. Thoracic aortic atherosclerosis.   Ct Head Wo Contrast 10/27/2016 1. New focal hypodensity measuring approximately 2.5 cm in the right caudate, extending into the right lentiform nuclei, likely subacute infarction. No mass effect or midline shift. Recommend brain MRI to characterize. 2. Unchanged ventriculomegaly and remote infarctions involving the left corona radiata and both cerebellar hemispheres.  Ct Angio Head W Or Wo Contrast Ct Angio Neck W Or Wo Contrast 10/28/2016  1. Severe atherosclerosis. Negative for emergent large vessel occlusion. 2. Pseudoaneurysm of the proximal Brachiocephalic Artery (see series 8, image 121). Recommend Vascular Surgery consultation. 3. 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 No discrete right MCA branch occlusion identified although there is a paucity of enhancing anterior division M2/M3 branches. 4. Extensive Right carotid atherosclerosis without hemodynamically significant stenosis. 5. High-grade RADIOGRAPHIC STRING SIGN STENOSIS of the Left ICA origin due to bulky  calcified plaque. 6. 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 7. Severe stenosis Left PCA P2 segment. 8. Expected evolution on CT of the acute right basal ganglia infarcts. No associated hemorrhage or mass effect. 9. Emphysema.   Mr Brain Wo Contrast 10/28/2016 1. Two acute infarcts in the right lateral lenticulostriate distribution. 2. Chronic microvascular disease and small remote left frontal infarct. 3. Lateral and third ventriculomegaly, favor atrophy over communicating hydrocephalus.   2D Echocardiogram  - Left ventricle: The cavity size was normal. Wall thickness was increased in a pattern of moderate LVH. Systolic function was vigorous. The estimated ejection fraction was in the range of 65% to 70%. Wall motion was normal; there were no regional wall motion abnormalities. Features are consistent with a pseudonormal left ventricular filling pattern, with concomitant abnormal relaxation and increased filling pressure (grade 2 diastolic dysfunction). - Aortic valve: Mildly calcified annulus. Mildly thickened, moderately calcified leaflets. There was mild to moderate regurgitation. Valve area (VTI): 1.14 cm^2. Valve area (Vmax): 1.09 cm^2. Valve area (Vmean): 1 cm^2. - Mitral valve: There was mild regurgitation.   PHYSICAL EXAM  Temp:  [98.5 F (36.9 C)-100 F (37.8 C)] 99.1 F (37.3 C) (12/04 1730) Pulse Rate:  [47-105] 86 (12/04 1730) Resp:  [16-21] 18 (12/04 1730) BP: (135-207)/(64-78) 168/75 (12/04 1730) SpO2:  [92 %-99 %] 99 % (12/04 1730) FiO2 (%):  [0 %] 0 % (12/03 2351) Weight:  [202 lb 12.8 oz (92 kg)] 202 lb 12.8 oz (92 kg) (12/04 0100)  General - Well nourished, well developed, in no apparent distress.  Ophthalmologic - Fundi not visualized due to eye movement.  Cardiovascular - Regular rate and rhythm.  Mental Status -  Level of arousal and orientation to time, place, and person were intact. Language including expression, naming,  repetition, comprehension was assessed and found intact. Moderate dysarthria Fund of Knowledge was assessed and was intact.  Cranial Nerves II - XII - II - Visual field intact OU. III, IV, VI - Extraocular movements intact. V - Facial sensation intact bilaterally. VII - left facial droop. VIII - Hearing & vestibular intact bilaterally. X - Palate elevates symmetrically. XI - Chin turning & shoulder shrug intact bilaterally. XII - Tongue protrusion intact.  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  Bulk was normal and fasciculations were absent.   Motor Tone - Muscle tone was assessed at the neck and appendages and was normal.  Reflexes - The patient's reflexes were 1+ in all extremities and he had no pathological reflexes.  Sensory - Light touch, temperature/pinprick were assessed and were symmetrical.    Coordination - The patient had normal movements in the right hand with no ataxia or dysmetria.  Tremor was absent.  Gait and Station - not tested  due to weakness   ASSESSMENT/PLAN Mr. [name] Cook is a 75 y.o. 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 He did not receive IV t-PA due to delay in arrival.   Stroke:  Non-dominant right basal ganglia infarct - etiology uncertain.  1. Most likely due to intracranial / extracranial atherosclerosis - diffuse intracranial stenosis on CTA head, tandem stenosis right M1 and bifurcation, no flowing limiting calcified plaques at ICA origin and ICA siphone 2. 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 cardioembolic - pt denies heart palpitation, will recommend 30 day cardiac event monitoring   Resultant  Left hemiparesis, left facial droop  MRI  R lateral lenticulostriate infarcts. Old small L frontal infarct.   CTA head and neck  - severe atherosclerosis. Pseudoaneurysm of proximal brachiocephalic artery. Mod to severe stenosis R M1 and MCA bifurcations, L V4, L P2. L ICA string sign.   2D Echo  EF 65-70%. No source of embolus   Recommend 30 day cardiac event monitoring as outpt  LDL 180  HgbA1c pending  Lovenox 40 mg sq daily for VTE prophylaxis Diet NPO time specified Except for: Ice Chips  aspirin 81 mg daily prior to admission, now on DAPT. Continue DAPT for 3 months and then plavix alone.    Patient counseled to be compliant with his antithrombotic medications  Ongoing aggressive stroke risk factor management  Therapy recommendations:  pending   Disposition:  pending   Left ICA high grade stenosis, asymptomatic  Pt no hx of stroke in the past  No left stroke this time  MRI did showed remote left MCA small infarct  VVS follow up as outpt to consider elective left CEA if pt recovers well from current stroke.  Hypertension  Elevated, as high as 207-64  From stroke standpoint, permissive hypertension (OK if < 220/120) but gradually normalize in 5-7 days  Long-term BP goal 130-150 due to left ICA stenosis  Hyperlipidemia  Home meds:  lipitor 40, resumed in hospital. Also listed on crestor 10  LDL 180, goal < 70  lipitor increased in hospital to 80 mg daily  Continue statin at discharge  Diabetes type II  HgbA1c pending , goal < 7.0  Other Stroke Risk Factors  Advanced age  Former Cigarette smoker  ETOH use, advised to drink no more than 2 drink(s) a day  Obesity, Body mass index is 30.84 kg/m., recommend weight loss, diet and exercise as appropriate   CAD s/p CABG with subsequent stents 2014  PVD s/p RLE bypass and post op infection causing right LE AKA  Other Active Problems  Hypokalemia  GERD  Hospital day # 0  Rosalin Hawking, MD PhD Stroke Neurology 10/28/2016 8:10 PM   To contact Stroke Continuity provider, please refer to [email or web address] After hours, contact  General Neurology

## 2016-10-28 NOTE — Progress Notes (Signed)
  Echocardiogram 2D Echocardiogram has been performed.  Jennette Dubin 10/28/2016, 11:26 AM

## 2016-10-28 NOTE — Progress Notes (Signed)
Patient had another episode of projectile vomiting  Zofran administered  Hypoactive B.S  Provider made aware.

## 2016-10-28 NOTE — Consult Note (Signed)
[facility] Specialist [facility]  Patient name: [name] Cook MRN: [id number] DOB: 1941/03/06 Sex: male  REASON FOR CONSULT: innominate artery pseudoaneurysm, left ICA stenosis consult is from Dr. Eliseo Squires  HPI: [name] Cook is a 75 y.o. male, who presents with left sided weakness. He reports developing slurred speech afterwards. The patient has no prior history of CVA. He has a prior history of CABG in 1999 and subsequent graft stenting in 2014. 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   Other medical issues include hyperlipidemia, hypertension and diabetes mellitus.   Past Medical History:  Diagnosis Date  . Arthritis   . CAD (coronary artery disease)    2014 75% left main stenosis, LAD 80% followed by a mid subtotal stenosis, circumflex 90% stenosis, right coronary artery occluded, SVG to RCA occluded, SVG ramus occluded, SVG to OM patent without a sequential anastomosis noted. Graft had a focal 99% stenosis which was stented. 3.5 x 16 mm premier. b. cath 10/13/2014 DES to SVG to OM overlap previous stent  . Cholelithiasis 2014  . Cogan syndrome   . Colon polyps   . Diabetes mellitus without complication ([place])   . Dyslipidemia   . Gallstones   . GERD (gastroesophageal reflux disease)   . Hepatic hemangioma 2014  . Hx of right BKA ([place])    a. 2/2 necrotizing fasciitis.  . Hypertension   . Kidney stones   . Myocardial infarction 1999  . Necrotizing fasciitis ([place])   . Peptic ulcer disease   . Peripheral vascular disease ([place])     Family History  Problem Relation Age of Onset  . Hyperlipidemia Mother   . Hypertension Mother   . Other Mother     varicose veins  . Heart disease Mother   . Rheum arthritis Father   . Heart attack Father   . Heart disease Maternal Grandfather   . Diabetes Sister   . Colon cancer Neg Hx     SOCIAL HISTORY: Social History   Social History  . Marital status: Married      Spouse name: N/A  . Number of children: 2  . 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 Smoking status: Former Smoker    Types: Cigarettes    Start date: 12/26/1962    Quit date: 11/25/1997  . Smokeless tobacco: Never Used  . Alcohol use 8.4 oz/week    14 Shots of liquor per week     Comment: daily   . Drug use: No  . Sexual activity: Not on file   Other Topics Concern  . Not on file   Social History Narrative   Lives in [place] with his wife    Allergies  Allergen Reactions  . Altace [Ramipril] Other (See Comments)    hypotension    Current Facility-Administered Medications  Medication Dose Route Frequency Provider Last Rate Last Dose  .  stroke: mapping our early stages of recovery book   Does not apply Once Ilene Qua Opyd, MD      . 0.9 % NaCl with KCl 40 mEq / L  infusion   Intravenous Continuous Geradine Girt, DO 50 mL/hr at 10/28/16 1433 50 mL/hr at 10/28/16 1433  . acetaminophen (TYLENOL) suppository 650 mg  650 mg Rectal Q4H PRN Vianne Bulls, MD      . aspirin suppository 300 mg  300 mg Rectal Daily Vianne Bulls, MD   300 mg at 10/28/16 [id number]   Or  . aspirin tablet 325 mg  325 mg Oral Daily Ilene Qua Opyd, MD      . atorvastatin (LIPITOR) tablet 80 mg  80 mg Oral q1800 Rosalin Hawking, MD      . calcium-vitamin D (OSCAL WITH D) 500-200 MG-UNIT per tablet 1 tablet  1 tablet Oral Daily Ilene Qua Opyd, MD      . diphenhydrAMINE (BENADRYL) capsule 25 mg  25 mg Oral QHS PRN Ilene Qua Opyd, MD      . enoxaparin (LOVENOX) injection 40 mg  40 mg Subcutaneous Q24H Vianne Bulls, MD   40 mg at 10/28/16 1340  . famotidine (PEPCID) IVPB 20 mg premix  20 mg Intravenous Q12H Vianne Bulls, MD   20 mg at 10/28/16 0917  . finasteride (PROSCAR) tablet 5 mg  5 mg Oral Daily Timothy S Opyd, MD      . insulin aspart (novoLOG) injection 0-9 Units  0-9 Units Subcutaneous Q4H Vianne Bulls, MD   1 Units at  10/28/16 1341  . labetalol (NORMODYNE,TRANDATE) injection 5 mg  5 mg Intravenous Q2H PRN Vianne Bulls, MD      . morphine 4 MG/ML injection 1 mg  1 mg Intravenous Q3H PRN Vianne Bulls, MD   1 mg at 10/28/16 0540  . multivitamin with minerals tablet 1 tablet  1 tablet Oral Daily Ilene Qua Opyd, MD      . ondansetron (ZOFRAN) injection 4 mg  4 mg Intravenous Q6H PRN Vianne Bulls, MD   4 mg at 10/28/16 0545  . promethazine (PHENERGAN) injection 12.5 mg  12.5 mg Intravenous Q6H PRN Vianne Bulls, MD   12.5 mg at 10/28/16 0115  . ranitidine (ZANTAC) 150 MG/10ML syrup 150 mg  150 mg Oral Once Dewaine Conger, MD      . senna-docusate (Senokot-S) tablet 1 tablet  1 tablet Oral QHS PRN Vianne Bulls, MD      . tamsulosin (FLOMAX) capsule 0.8 mg  0.8 mg Oral Daily Vianne Bulls, MD        REVIEW OF SYSTEMS:  [X]  denotes positive finding, [ ]  denotes negative finding Cardiac  Comments:  Chest pain or chest pressure:    Shortness of breath upon exertion:    Short of breath when lying flat:    Irregular heart rhythm:        Vascular    Pain in calf, thigh, or hip brought on by ambulation:    Pain in feet at night that wakes you up from your sleep:     Blood clot in your veins:    Leg swelling:         Pulmonary    Oxygen at home:    Productive cough:     Wheezing:         Neurologic    Sudden weakness in arms or legs:  x Left leg and warm  Sudden numbness in arms or legs:     Sudden onset of difficulty speaking or slurred speech: x   Temporary loss of vision in one eye:     Problems with dizziness:         Gastrointestinal    Blood in stool:     Vomited blood:         Genitourinary    Burning when urinating:     Blood in urine:  Psychiatric    Major depression:         Hematologic    Bleeding problems:    Problems with blood clotting too easily:        Skin    Rashes or ulcers:        Constitutional    Fever or chills:      PHYSICAL EXAM: Vitals:   10/28/16  0657 10/28/16 0900 10/28/16 1250 10/28/16 1321  BP: (!) 144/67 (!) 196/78 (!) 164/69 (!) 150/66  Pulse: 98 89 87   Resp: 20 20 16 18   Temp: 98.6 F (37 C) 98.5 F (36.9 C)    TempSrc: Oral Oral Oral   SpO2: 96% 93% 92%   Weight:      Height:        GENERAL: The patient is an ill appearing male, in no acute distress. The vital signs are documented above. VASCULAR: palpable radial pulses bilaterally.  PULMONARY: non labored respiratory effort.  MUSCULOSKELETAL: right BKA well healed NEUROLOGIC: Minimal movement LUE, LLE SKIN: There are no ulcers or rashes noted. PSYCHIATRIC: The patient has a normal affect.  DATA:  CTA Head/Neck 10/28/16 IMPRESSION: 1. Severe atherosclerosis. Negative for emergent large vessel occlusion. 2. Pseudoaneurysm of the proximal Brachiocephalic Artery (see series 8, image 121). Recommend Vascular Surgery consultation. 3. 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 No discrete right MCA branch occlusion identified although there is a paucity of enhancing anterior division M2/M3 branches. 4. Extensive Right carotid atherosclerosis without hemodynamically significant stenosis. 5. High-grade RADIOGRAPHIC STRING SIGN STENOSIS of the Left ICA origin due to bulky calcified plaque. 6. 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 7. Severe stenosis Left PCA P2 segment. 8. Expected evolution on CT of the acute right basal ganglia infarcts. No associated hemorrhage or mass effect. 9. Emphysema.  MR Brain 10/28/16  IMPRESSION: 1. Two acute infarcts in the right lateral lenticulostriate distribution. 2. Chronic microvascular disease and small remote left frontal infarct. 3. Lateral and third ventriculomegaly, favor atrophy over communicating hydrocephalus.   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 This may be secondary to his innominate pseudoaneurysm vs focal dissection, embolic from focal non-flow limiting lesion right carotid artery, intercranial lesion or cardiac in nature. Recommend cerebral angiogram with Dr. Estanislado Pandy for further evaluation.  He will need his high grade left carotid stenosis addressed electively in the future once his current medical issues are stabilized.    Virgina Jock, PA-C [facility] [place]   I have examined the patient, reviewed and agree with above. Extremely complex patient. Presented with dense stroke with the total paralysis of his left arm and leg which has persisted. He reports that he has some slurred speech as well. He is alert and oriented and able to communicate. He denies any prior history of neurologic deficit. Does have a history of coronary bypass grafting nearly 20 years ago and complication of necrotizing fasciitis and below-knee amputation.  I reviewed his studies including a CT angiogram. This does show irregularity and his innominate artery. This looks as though it could be a chronic dissection on his axial planes. On the coronal this potentially could be a false aneurysm. This could date back even till the time of his surgery since her been no  other imaging studies of this area. He also has extensive calcification of his aortic arch and does have extensive calcification and his right carotid bifurcation with no flow-limiting stenosis. He also has extensive calcification of his intracranial carotid arteries. On the left he has a asymptomatic high-grade left internal carotid artery with extreme calcification.  Very difficult management decision. He has had a very dense stroke. Explained that no intervention I would speed his recovery in the main issue now is recovery from his stroke. Also explained that is difficult  to determine what caused this event with multiple possible etiologies. Would recommend consultation with neuro interventional radiology to consider formal cerebral arteriography for further evaluation of his intracranial circulation. This may all well be related to this. Would not recommend left carotid endarterectomy at this time even though he has a high-grade stenosis since this is caused no symptoms. This could be addressed in the future or if he has significant recovery from his current stroke. Will follow with you.  Curt Jews, MD 10/28/2016 5:35 PM

## 2016-10-28 NOTE — ED Notes (Signed)
The pts speech is mostly clear  occassional words the family is saying is different

## 2016-10-28 NOTE — Progress Notes (Signed)
Patient admitted from ED alert  And oriented  Oriented to room and unit Telemetry connected and verified. Patient had a Large projectile vomiting of about 200cc of brown colored emesis. Phenergan administered.

## 2016-10-28 NOTE — Progress Notes (Addendum)
Code Stroke.  LSN per patient was around 9:00 when patient fell at home, patient just told his family prior to coming to the hospital.  Because his LSN time was not definite, CT was not done and patient taken to ED room to be examined.  NIH was 6, patient had LT facial droop, mild dysarthria, LT ARM 2/5, and LT LEG 1/5.    Code stroke cancelled at 2129 by Dr. Nicole Kindred, patient admitted to 78M

## 2016-10-28 NOTE — Evaluation (Signed)
Clinical/Bedside Swallow Evaluation Patient Details  Name: [name] Cook MRN: [id number] Date of Birth: 1941-04-24  Today's Date: 10/28/2016 Time: SLP Start Time (ACUTE ONLY): 1348 SLP Stop Time (ACUTE ONLY): 1407 SLP Time Calculation (min) (ACUTE ONLY): 19 min  Past Medical History:  Past Medical History:  Diagnosis Date  . Arthritis   . CAD (coronary artery disease)    2014 75% left main stenosis, LAD 80% followed by a mid subtotal stenosis, circumflex 90% stenosis, right coronary artery occluded, SVG to RCA occluded, SVG ramus occluded, SVG to OM patent without a sequential anastomosis noted. Graft had a focal 99% stenosis which was stented. 3.5 x 16 mm premier. b. cath 10/13/2014 DES to SVG to OM overlap previous stent  . Cholelithiasis 2014  . Cogan syndrome   . Colon polyps   . Diabetes mellitus without complication ([place])   . Dyslipidemia   . Gallstones   . GERD (gastroesophageal reflux disease)   . Hepatic hemangioma 2014  . Hx of right BKA ([place])    a. 2/2 necrotizing fasciitis.  . Hypertension   . Kidney stones   . Myocardial infarction 1999  . Necrotizing fasciitis ([place])   . Peptic ulcer disease   . Peripheral vascular disease [facility])    Past Surgical History:  Past Surgical History:  Procedure Laterality Date  . APPENDECTOMY  2007  . CORONARY ARTERY BYPASS GRAFT  1999   LIMA to the LAD, saphenous vein graft to obtuse marginal 1 and obtuse marginal 2, spahenous vein graft to intermediate, spahenous vein graft to the right coronary artery in 1999  . LEFT HEART CATHETERIZATION WITH CORONARY ANGIOGRAM N/A 10/13/2014   Procedure: LEFT HEART CATHETERIZATION WITH CORONARY ANGIOGRAM;  Surgeon: Peter M Martinique, MD;  Location: [name] CATH LAB;  Service: Cardiovascular;  Laterality: N/A;  . LEFT HEART CATHETERIZATION WITH CORONARY/GRAFT ANGIOGRAM N/A 07/19/2013   Procedure: LEFT HEART CATHETERIZATION WITH Beatrix Fetters;  Surgeon: Peter M Martinique, MD;  Location: [facility] CATH LAB;   Service: Cardiovascular;  Laterality: N/A;  . LEG AMPUTATION  1999   Right BKA  . PERCUTANEOUS CORONARY STENT INTERVENTION (PCI-S)  07/19/2013   Procedure: PERCUTANEOUS CORONARY STENT INTERVENTION (PCI-S);  Surgeon: Peter M Martinique, MD;  Location: [facility] CATH LAB;  Service: Cardiovascular;;   HPI:  [name] Summerlinis a 75 y.o.malewith medical history significant for coronary artery disease status post CABG, type 2 diabetes mellitus, hypertension, hyperlipidemia, and GERD who presents to the ED with weakness involving the left face, left arm, and left leg resulting in a fall. 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 CXR mild hypo inflation accentuates the lung markings, likely low-grade CHF with mild interstitial edema. Mildly increased density in the right perihilar region may reflect subsegmental atelectasis.   Assessment / Plan / Recommendation Clinical Impression  Pt with significant facial weakness, dysarthria with possible silent penetration or aspiration that can not be assessed at bedside. Pt complained of burning sensation in chest likely due to esophageal irritation from vomitting yesterday (vocal quality hoarse as well). Following oral care pt may have several ice chips PRN only with MBS tomorrow morning.      Aspiration Risk   (mod-severe)    Diet Recommendation Ice chips PRN after oral care        Other  Recommendations Oral Care Recommendations: Oral care QID   Follow up Recommendations Inpatient Rehab  Frequency and Duration            Prognosis        Swallow Study   General HPI: [name] Summerlinis a 75 y.o.malewith medical history significant for coronary artery disease status post CABG, type 2 diabetes mellitus, hypertension, hyperlipidemia, and GERD who presents to the ED with  weakness involving the left face, left arm, and left leg resulting in a fall. 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 CXR mild hypo inflation accentuates the lung markings, likely low-grade CHF with mild interstitial edema. Mildly increased density in the right perihilar region may reflect subsegmental atelectasis. Previous Swallow Assessment:  (none) Respiratory Status: Nasal cannula History of Recent Intubation: No Behavior/Cognition: Alert;Cooperative;Pleasant mood;Requires cueing Oral Care Completed by SLP: No Oral Cavity - Dentition: Adequate natural dentition Vision: Functional for self-feeding Baseline Vocal Quality: Hoarse Volitional Cough: Strong Volitional Swallow: Able to elicit    Oral/Motor/Sensory Function Overall Oral Motor/Sensory Function:  (mod-severe) Facial ROM: Reduced left;Suspected CN VII (facial) dysfunction Facial Symmetry: Suspected CN VII (facial) dysfunction;Abnormal symmetry left Facial Strength: Reduced left;Suspected CN VII (facial) dysfunction Facial Sensation: Reduced left;Suspected CN V (Trigeminal) dysfunction Lingual ROM:  (imprecision) Lingual Symmetry: Within Functional Limits Mandible: Within Functional Limits   Ice Chips Ice chips: Impaired Presentation: Spoon Pharyngeal Phase Impairments: Suspected delayed Swallow   Thin Liquid Thin Liquid: Impaired Presentation: Cup Oral Phase Impairments: Reduced lingual movement/coordination Oral Phase Functional Implications: Left anterior spillage Pharyngeal  Phase Impairments: Suspected delayed Swallow    Nectar Thick Nectar Thick Liquid: Not tested   Honey Thick Honey Thick Liquid: Not tested   Puree Puree: Impaired Presentation: Spoon Oral Phase Functional Implications:  (left labial residue) Pharyngeal Phase  Impairments: Suspected delayed Swallow   Solid   GO   Solid: Not tested        [name] Cook 10/28/2016,4:55 PM   [name] Cook Colvin Caroli.[employer] [phone]

## 2016-10-28 NOTE — Evaluation (Signed)
Physical Therapy Evaluation Patient Details Name: [name] Cook MRN: [id number] DOB: Sep 13, 1941 Today's Date: 10/28/2016   History of Present Illness  75 y.o.malewith medical history significant for coronary artery disease status post CABG and subsequent graft stent, type 2 diabetes mellitus, hypertension, hyperlipidemia, and GERD who presents to the ED with weakness involving the left face, left arm, and left leg. he fell at home. MRI brain +Two acute infarcts in the right lateral lenticulostriate  Clinical Impression  Pt admitted with above diagnosis. Pt currently with functional limitations due to the deficits listed below (see PT Problem List). Patient is highly motivated and familiar with therapy process from Rt BKA. He has decr awareness of his deficits and Lt inattention (vs visual field cut). Pt will benefit from skilled PT to increase their independence and safety with mobility to allow discharge to the venue listed below.       Follow Up Recommendations CIR    Equipment Recommendations  Other (comment) (TBA)    Recommendations for Other Services Rehab consult     Precautions / Restrictions Precautions Precautions: Fall Required Braces or Orthoses: Other Brace/Splint Other Brace/Splint: Rt BK prosthesis is here at hospital      Mobility  Bed Mobility Overal bed mobility: Needs Assistance;+2 for physical assistance Bed Mobility: Rolling;Sidelying to Sit;Sit to Supine Rolling: Total assist;+2 for physical assistance (Rt) Sidelying to sit: Max assist;+2 for physical assistance;HOB elevated;+2 for safety/equipment   Sit to supine: Max assist;+2 for physical assistance;+2 for safety/equipment   General bed mobility comments: rolled to Rt x 2; second attempt pt able to reach for his left elbow and bring LUE partially across body; using RUE on bedrail; pt assisted with directing his torso as returning to supine  Transfers Overall transfer level: Needs  assistance Equipment used: None Transfers: Sit to/from Stand           General transfer comment: attempted from elevated EOB x 2 with 2 person moderate assist and only able to clear his hips ~4" off mattress; retruned to sit EOB  Ambulation/Gait                Stairs            Wheelchair Mobility    Modified Rankin (Stroke Patients Only) Modified Rankin (Stroke Patients Only) Pre-Morbid Rankin Score: Moderately severe disability Modified Rankin: Severe disability     Balance Overall balance assessment: Needs assistance Sitting-balance support: Single extremity supported;Feet supported Sitting balance-Leahy Scale: Zero Sitting balance - Comments: loses balance posterior and to the left, especially with decr attention Postural control: Posterior lean;Left lateral lean Standing balance support: Single extremity supported Standing balance-Leahy Scale: Zero Standing balance comment: unabl eto achieve full standing with RUE on bedrail or on back of chair in front of him                             Pertinent Vitals/Pain Pain Assessment: No/denies pain    Home Living Family/patient expects to be discharged to:: Private residence Living Arrangements: Spouse/significant other             Home Equipment: Wheelchair - manual      Prior Function Level of Independence: Needs assistance   Gait / Transfers Assistance Needed: used w/c for primary locomotion; w/c did not fit into bathroom, he installed grab bars and used prosthesis to walk in/out           Hand Dominance   Dominant Hand: Right  Extremity/Trunk Assessment   Upper Extremity Assessment: Defer to OT evaluation (no active movement noted)           Lower Extremity Assessment: RLE deficits/detail;LLE deficits/detail RLE Deficits / Details: Rt BKA; excellent knee flexion and extension; small abrasions on patella and tibial tuberosity; bruising above patella. Wife reports likely  from fall, although pt has had problems with developing blisters with all his prosthetic legs LLE Deficits / Details: AAROM WFL; hip abdct/adduction 2/5, hip extension 2+, knee extension 2+; ankle DF 0; toe flexion 1+  Cervical / Trunk Assessment: Other exceptions  Communication   Communication: Expressive difficulties (seems slurred)  Cognition Arousal/Alertness: Awake/alert Behavior During Therapy: Impulsive Overall Cognitive Status: Impaired/Different from baseline Area of Impairment: Attention;Following commands;Safety/judgement;Awareness;Problem solving   Current Attention Level: Sustained   Following Commands: Follows one step commands with increased time Safety/Judgement: Decreased awareness of safety;Decreased awareness of deficits Awareness: Intellectual Problem Solving: Requires verbal cues;Requires tactile cues General Comments: Patient with head turned to his right unless cued to look to his left (which he could then do). He neglected LUE and several times required prompt to "find his lt arm."    General Comments General comments (skin integrity, edema, etc.): wife present    Exercises     Assessment/Plan    PT Assessment Patient needs continued PT services  PT Problem List Decreased strength;Decreased activity tolerance;Decreased balance;Decreased mobility;Decreased cognition;Decreased knowledge of use of DME;Decreased safety awareness;Impaired sensation;Impaired tone;Obesity          PT Treatment Interventions DME instruction;Functional mobility training;Therapeutic activities;Balance training;Neuromuscular re-education;Cognitive remediation;Patient/family education;Wheelchair mobility training    PT Goals (Current goals can be found in the Care Plan section)  Acute Rehab PT Goals Patient Stated Goal: wants to walk again PT Goal Formulation: With patient Time For Goal Achievement: 11/04/16 Potential to Achieve Goals: Good    Frequency Min 4X/week   Barriers  to discharge        Co-evaluation               End of Session Equipment Utilized During Treatment: Gait belt;Oxygen Activity Tolerance: Patient limited by fatigue Patient left: in bed;with call bell/phone within reach;with bed alarm set;with family/visitor present           Time: [id number] PT Time Calculation (min) (ACUTE ONLY): 44 min   Charges:   PT Evaluation $PT Eval High Complexity: 1 Procedure PT Treatments $Therapeutic Activity: 23-37 mins   PT G CodesJeanie Cooks Aeron Lheureux 15-Nov-2016, 4:17 PM Pager [phone]

## 2016-10-28 NOTE — Evaluation (Signed)
Speech Language Pathology Evaluation Patient Details Name: [name] Cook MRN: [id number] DOB: [birth year] Today's Date: 10/28/2016 Time: [id number] SLP Time Calculation (min) (ACUTE ONLY): 19 min  Problem List:  Patient Active Problem List   Diagnosis Date Noted  . CVA (cerebral vascular accident) ([place]) 10/28/2016  . Left-sided weakness 10/27/2016  . Ischemic stroke ([place]) 10/27/2016  . Acute ischemic stroke ([place]) 10/27/2016  . NSTEMI (non-ST elevated myocardial infarction) ([place]) 10/12/2014  . Hx of right BKA ([place]) 10/12/2014  . 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 Atherosclerosis of native arteries of extremity with intermittent claudication ([place]) 12/08/2013  . Diabetes mellitus with circulatory complication ([place])   . Essential hypertension, benign 07/17/2013  . Hypokalemia 07/17/2013  . Non-ST elevation myocardial infarction (NSTEMI), initial episode of care ([place]) 07/16/2013  . GERD (gastroesophageal reflux disease) 03/15/2013  . Peripheral vascular disease ([place]) 03/03/2013  . CAROTID BRUIT 02/06/2011  . Dyslipidemia 10/31/2009  . Essential hypertension 10/31/2009  . Coronary atherosclerosis 10/31/2009   Past Medical History:  Past Medical History:  Diagnosis Date  . Arthritis   . CAD (coronary artery disease)    2014 75% left main stenosis, LAD 80% followed by a mid subtotal stenosis, circumflex 90% stenosis, right coronary artery occluded, SVG to RCA occluded, SVG ramus occluded, SVG to OM patent without a sequential anastomosis noted. Graft had a focal 99% stenosis which was stented. 3.5 x 16 mm premier. b. cath 10/13/2014 DES to SVG to OM overlap previous stent  . Cholelithiasis 2014  . Cogan syndrome   . Colon polyps   . Diabetes mellitus without complication ([place])   . Dyslipidemia   . Gallstones   . GERD (gastroesophageal reflux disease)   . Hepatic hemangioma 2014  . Hx of right BKA ([place])    a. 2/2 necrotizing fasciitis.  .  Hypertension   . Kidney stones   . Myocardial infarction 1999  . Necrotizing fasciitis ([place])   . Peptic ulcer disease   . Peripheral vascular disease [facility])    Past Surgical History:  Past Surgical History:  Procedure Laterality Date  . APPENDECTOMY  2007  . CORONARY ARTERY BYPASS GRAFT  1999   LIMA to the LAD, saphenous vein graft to obtuse marginal 1 and obtuse marginal 2, spahenous vein graft to intermediate, spahenous vein graft to the right coronary artery in 1999  . LEFT HEART CATHETERIZATION WITH CORONARY ANGIOGRAM N/A 10/13/2014   Procedure: LEFT HEART CATHETERIZATION WITH CORONARY ANGIOGRAM;  Surgeon: Peter M Martinique, MD;  Location: [facility] CATH LAB;  Service: Cardiovascular;  Laterality: N/A;  . LEFT HEART CATHETERIZATION WITH CORONARY/GRAFT ANGIOGRAM N/A 07/19/2013   Procedure: LEFT HEART CATHETERIZATION WITH Beatrix Fetters;  Surgeon: Peter M Martinique, MD;  Location: [facility] CATH LAB;  Service: Cardiovascular;  Laterality: N/A;  . LEG AMPUTATION  1999   Right BKA  . PERCUTANEOUS CORONARY STENT INTERVENTION (PCI-S)  07/19/2013   Procedure: PERCUTANEOUS CORONARY STENT INTERVENTION (PCI-S);  Surgeon: Peter M Martinique, MD;  Location: [facility] CATH LAB;  Service: Cardiovascular;;   HPI:  [name] Summerlinis a 75 y.o.malewith medical history significant for coronary artery disease status post CABG, type 2 diabetes mellitus, hypertension, hyperlipidemia, and GERD who presents to the ED with weakness involving the left face, left arm, and left leg resulting in a fall. 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 CXR mild hypo inflation accentuates the  lung markings, likely low-grade CHF with mild interstitial edema. Mildly increased density in the right perihilar region may reflect subsegmental atelectasis.   Assessment  / Plan / Recommendation Clinical Impression  Pt demonstrates mild-moderate dysarthria characterized by hoarse vocal quality, decreased vocal intensity and phonemic imprecision. Conversational speech is mostly intelligible in quiet settings. Cognitive impairments include left neglect/inattention, decreased emergent and anticipatory awareness, working memory and problem solving/exeutive functions for complex information. Pt appears to be an excellent candidate for inpatient rehab. Will treat pt while on acute care.      SLP Assessment  Patient needs continued Speech Lanaguage Pathology Services    Follow Up Recommendations  Inpatient Rehab    Frequency and Duration min 2x/week  2 weeks      SLP Evaluation Cognition  Overall Cognitive Status: Impaired/Different from baseline Arousal/Alertness: Awake/alert Orientation Level: Oriented to person;Oriented to place;Oriented to time;Oriented to situation Attention: Sustained Sustained Attention: Impaired Sustained Attention Impairment: Verbal basic Memory: Impaired Memory Impairment: Decreased recall of new information Awareness: Impaired Awareness Impairment: Emergent impairment;Anticipatory impairment Problem Solving: Impaired Problem Solving Impairment: Verbal basic;Functional basic Safety/Judgment: Impaired       Comprehension  Auditory Comprehension Overall Auditory Comprehension: Appears within functional limits for tasks assessed Yes/No Questions: Not tested Commands: Within Functional Limits Conversation: Simple Interfering Components: Attention Visual Recognition/Discrimination Discrimination: Not tested Reading Comprehension Reading Status:  (interrupted- will assess)    Expression Expression Primary Mode of Expression: Verbal Verbal Expression Overall Verbal Expression: Appears within functional limits for tasks assessed Initiation: No impairment Level of Generative/Spontaneous Verbalization: Sentence Repetition:   (NT) Naming: Not tested Pragmatics: Impairment Impairments: Eye contact Interfering Components: Attention Written Expression Dominant Hand: Right Written Expression:  (TBA)   Oral / Motor  Oral Motor/Sensory Function Overall Oral Motor/Sensory Function:  (mod-severe) Facial ROM: Reduced left;Suspected CN VII (facial) dysfunction Facial Symmetry: Suspected CN VII (facial) dysfunction;Abnormal symmetry left Facial Strength: Reduced left;Suspected CN VII (facial) dysfunction Facial Sensation: Reduced left;Suspected CN V (Trigeminal) dysfunction Lingual ROM:  (imprecision) Lingual Symmetry: Within Functional Limits Mandible: Within Functional Limits Motor Speech Overall Motor Speech: Impaired Respiration: Impaired Level of Impairment: Conversation Phonation: Hoarse Resonance: Within functional limits Articulation: Impaired Level of Impairment: Conversation Intelligibility: Intelligible Motor Planning: Witnin functional limits   GO                    Houston Siren 10/28/2016, 5:10 PM   Orbie Pyo [employer] [phone]

## 2016-10-28 NOTE — Care Management Note (Signed)
Case Management Note  Patient Details  Name: [name] Cook MRN: [id number] Date of Birth: [birth year]  Subjective/Objective:   Pt admitted with CVA. He is from home with his spouse.                 Action/Plan: Awaiting PT/OT recommendations. CM following for d/c needs.   Expected Discharge Date:                  Expected Discharge Plan:     In-House Referral:     Discharge planning Services     Post Acute Care Choice:    Choice offered to:     DME Arranged:    DME Agency:     HH Arranged:    HH Agency:     Status of Service:  In process, will continue to follow  If discussed at Long Length of Stay Meetings, dates discussed:    Additional Comments:  Pollie Friar, RN 10/28/2016, 1:16 PM

## 2016-10-29 ENCOUNTER — Inpatient Hospital Stay (HOSPITAL_COMMUNITY): Payer: Medicare Other

## 2016-10-29 DIAGNOSIS — I10 Essential (primary) hypertension: Secondary | ICD-10-CM

## 2016-10-29 DIAGNOSIS — I169 Hypertensive crisis, unspecified: Secondary | ICD-10-CM

## 2016-10-29 DIAGNOSIS — R7303 Prediabetes: Secondary | ICD-10-CM

## 2016-10-29 DIAGNOSIS — I6330 Cerebral infarction due to thrombosis of unspecified cerebral artery: Secondary | ICD-10-CM

## 2016-10-29 DIAGNOSIS — I25810 Atherosclerosis of coronary artery bypass graft(s) without angina pectoris: Secondary | ICD-10-CM

## 2016-10-29 DIAGNOSIS — E785 Hyperlipidemia, unspecified: Secondary | ICD-10-CM

## 2016-10-29 DIAGNOSIS — I6529 Occlusion and stenosis of unspecified carotid artery: Secondary | ICD-10-CM

## 2016-10-29 DIAGNOSIS — E1159 Type 2 diabetes mellitus with other circulatory complications: Secondary | ICD-10-CM

## 2016-10-29 DIAGNOSIS — I6523 Occlusion and stenosis of bilateral carotid arteries: Secondary | ICD-10-CM

## 2016-10-29 DIAGNOSIS — I63311 Cerebral infarction due to thrombosis of right middle cerebral artery: Secondary | ICD-10-CM

## 2016-10-29 DIAGNOSIS — I639 Cerebral infarction, unspecified: Secondary | ICD-10-CM

## 2016-10-29 DIAGNOSIS — E876 Hypokalemia: Secondary | ICD-10-CM

## 2016-10-29 DIAGNOSIS — I6350 Cerebral infarction due to unspecified occlusion or stenosis of unspecified cerebral artery: Secondary | ICD-10-CM

## 2016-10-29 DIAGNOSIS — N390 Urinary tract infection, site not specified: Secondary | ICD-10-CM

## 2016-10-29 DIAGNOSIS — I658 Occlusion and stenosis of other precerebral arteries: Secondary | ICD-10-CM

## 2016-10-29 LAB — GLUCOSE, CAPILLARY
Glucose-Capillary: 109 mg/dL — ABNORMAL HIGH (ref 65–99)
Glucose-Capillary: 113 mg/dL — ABNORMAL HIGH (ref 65–99)
Glucose-Capillary: 120 mg/dL — ABNORMAL HIGH (ref 65–99)
Glucose-Capillary: 122 mg/dL — ABNORMAL HIGH (ref 65–99)
Glucose-Capillary: 141 mg/dL — ABNORMAL HIGH (ref 65–99)
Glucose-Capillary: 99 mg/dL (ref 65–99)

## 2016-10-29 LAB — HEMOGLOBIN A1C
HEMOGLOBIN A1C: 5.9 % — AB (ref 4.8–5.6)
MEAN PLASMA GLUCOSE: 123 mg/dL

## 2016-10-29 MED ORDER — INSULIN ASPART 100 UNIT/ML ~~LOC~~ SOLN
0.0000 [IU] | Freq: Three times a day (TID) | SUBCUTANEOUS | Status: DC
  Administered 2016-10-30 (×3): 1 [IU] via SUBCUTANEOUS

## 2016-10-29 MED ORDER — HALOPERIDOL 1 MG PO TABS
2.0000 mg | ORAL_TABLET | Freq: Once | ORAL | Status: AC
  Administered 2016-10-29: 2 mg via ORAL
  Filled 2016-10-29: qty 2

## 2016-10-29 MED ORDER — POTASSIUM CHLORIDE CRYS ER 20 MEQ PO TBCR
40.0000 meq | EXTENDED_RELEASE_TABLET | Freq: Once | ORAL | Status: AC
  Administered 2016-10-29: 40 meq via ORAL
  Filled 2016-10-29: qty 2

## 2016-10-29 MED ORDER — CHLORPROMAZINE HCL 25 MG/ML IJ SOLN
25.0000 mg | Freq: Once | INTRAMUSCULAR | Status: AC
  Administered 2016-10-30: 25 mg via INTRAMUSCULAR
  Filled 2016-10-29: qty 1

## 2016-10-29 MED ORDER — ORAL CARE MOUTH RINSE
15.0000 mL | Freq: Two times a day (BID) | OROMUCOSAL | Status: DC
  Administered 2016-10-29 – 2016-10-30 (×3): 15 mL via OROMUCOSAL

## 2016-10-29 MED ORDER — STARCH (THICKENING) PO POWD
ORAL | Status: DC | PRN
  Filled 2016-10-29: qty 227

## 2016-10-29 NOTE — Progress Notes (Signed)
Rehab admissions - I met with patient, his wife and friends at the bedside.  Patient and wife live alone.  Sons live 30 or more minutes away.  At this point, wife does not have caregiver support other than herself.  Patient will likely need 2 caregivers even after a potential inpatient rehab stay.  I also spoke with patient's son on speaker phone.  Family to discuss options and speak with Education officer, museum.  I will check back tomorrow.  No rehab beds available today.  Call me for questions.  #[phone]

## 2016-10-29 NOTE — Progress Notes (Signed)
Modified Barium Swallow Progress Note  Patient Details  Name: Severiano Willinger MRN: [id number] Date of Birth: [birth year]  Today's Date: 10/29/2016  Modified Barium Swallow completed.  Full report located under Chart Review in the Imaging Section.  Brief recommendations include the following:  Clinical Impression  Moderate oral phase dysphagia marked by decreased bolus cohesion with left side spill and facial residue and delayed oral transit. Mild pharyngeal phase deficits are sensory based with swallow iniatiating at the level of the valleculae and pyriform sinuses. Pilar Plate and silent laryngeal penetration of nectar before and during swallow not consistently prevented with multiple trials chin tuck posture. Verbal cues to cough/throat clear intermittently cleared penetrates. Mild vallecular residue. Recommend Dys 2 texture, honey thick liquids, swallow twice, pills whole in applesauce, check oral cavity for pocketed food. ST will continue intervention.      Swallow Evaluation Recommendations       SLP Diet Recommendations: Dysphagia 2 (Fine chop) solids;Honey thick liquids   Liquid Administration via: Cup   Medication Administration: Whole meds with puree   Supervision: Patient able to self feed;Full supervision/cueing for compensatory strategies   Compensations: Slow rate;Small sips/bites;Minimize environmental distractions;Lingual sweep for clearance of pocketing;Multiple dry swallows after each bite/sip   Postural Changes: Remain semi-upright after after feeds/meals (Comment);Seated upright at 90 degrees   Oral Care Recommendations: Oral care BID   Other Recommendations: Order thickener from pharmacy    Houston Siren 10/29/2016,10:27 AM   Orbie Pyo [employer] [phone]

## 2016-10-29 NOTE — Consult Note (Signed)
[facility] CM Primary Care Navigator  10/29/2016  [name] Cook [birth year] [id number]  Met with patient at the bedside to identify possible discharge needs. Patient reports having left side weakness to arm, face and leg that led to this admission.  Patient endorses Dr. Alain Honey with [facility] as the primary care provider.    Patient shared using [facility] in [place] to obtain medications without any problem.   Patient re`ports that wife [name]) manages his medications at home using "pill box" system.   His wife provides transportation to doctors' appointments as stated.  Wife is the primary caregiver at home and has a son who live 30 minutes away who can assist if needed per patient. Per MD note, patient has significant functional deficit that wife will not be able to care for him by herself at discharge. If additional caregiver support is unavailable, would consider SNF- (skilled nursing facility) after completion of medical workup.    Patient voiced understanding to call primary care provider's office when he gets home, for a post discharge follow-up appointment within a week or sooner if needs arise. Patient letter provided as a reminder.  Patient deniesanycare needs or concerns at this time. [facility] care management contact information provided if future needs arise.   For additional questions please contact:  Edwena Felty A. Haizel Gatchell, BSN, RN-BC [facility] PRIMARY CARE Navigator Cell: [phone]

## 2016-10-29 NOTE — Evaluation (Signed)
Occupational Therapy Evaluation Patient Details Name: [name] Cook MRN: [id number] DOB: 1941-02-26 Today's Date: 10/29/2016    History of Present Illness 75 y.o.malewith medical history significant for coronary artery disease status post CABG and subsequent graft stent, type 2 diabetes mellitus, hypertension, hyperlipidemia, and GERD who presents to the ED with weakness involving the left face, left arm, and left leg. he fell at home. MRI brain +Two acute infarcts in the right lateral lenticulostriate   Clinical Impression   PT admitted with R CVA infarct. Pt currently with functional limitiations due to the deficits listed below (see OT problem list). PTA was able to complete bathroom transfers supervision and wife (A).  Pt will benefit from skilled OT to increase their independence and safety with adls and balance to allow discharge CIR.     Follow Up Recommendations  CIR    Equipment Recommendations  3 in 1 bedside comode;Wheelchair (measurements OT);Wheelchair cushion (measurements OT);Hospital bed    Recommendations for Other Services Rehab consult     Precautions / Restrictions Precautions Precautions: Fall Required Braces or Orthoses: Other Brace/Splint Other Brace/Splint: Rt BK prosthesis is here at hospital Restrictions Weight Bearing Restrictions: No      Mobility Bed Mobility Overal bed mobility: Needs Assistance;+2 for physical assistance Bed Mobility: Rolling;Sidelying to Sit;Sit to Supine Rolling: Total assist;+2 for physical assistance Sidelying to sit: Max assist;+2 for physical assistance;HOB elevated;+2 for safety/equipment   Sit to supine: Max assist;+2 for physical assistance;+2 for safety/equipment   General bed mobility comments: Pt requires cues for R UE to grap L UE and initiate bed mobility. Pt able to use R UE to push up into sitting  Transfers Overall transfer level: Needs assistance Equipment used: 2 person hand held assist Transfers:  Sit to/from Stand Sit to Stand: +2 physical assistance;Max assist         General transfer comment: Pt with strong L LE and needs facilitation for R side weight beaing x2 . pt with facilitation at shoulders for upright posture and cues for neck extension and to keep head neutral. pt with neck rotation with LOB immediatly    Balance Overall balance assessment: Needs assistance Sitting-balance support: Single extremity supported Sitting balance-Leahy Scale: Zero Sitting balance - Comments: loses balance posterior and to the left, especially with decr attention                                    ADL Overall ADL's : Needs assistance/impaired     Grooming: Wash/dry hands;Wash/dry face;Sitting;Maximal assistance   Upper Body Bathing: Maximal assistance   Lower Body Bathing: Total assistance                         General ADL Comments: total (A) to don R prosthesis supine. pt with strong L lean in sitting and lack of awareness. pt with uncontrolled oral secretions in static sitting with neck flexion. Pt needs manual facilitation for upright posture and central body positioning     Vision     Perception     Praxis      Pertinent Vitals/Pain Pain Assessment: No/denies pain Faces Pain Scale: Hurts little more Pain Intervention(s): Monitored during session;Premedicated before session;Repositioned     Hand Dominance Right   Extremity/Trunk Assessment Upper Extremity Assessment Upper Extremity Assessment: RUE deficits/detail RUE Deficits / Details: in supine demonstrates activation of triceps and scapula depression. pt unabel to demonstrates  shoulder flexion  or elbow flexion at this time. flaccid digits  RUE Sensation: decreased light touch RUE Coordination: decreased fine motor;decreased gross motor   Lower Extremity Assessment Lower Extremity Assessment: Defer to PT evaluation   Cervical / Trunk Assessment Cervical / Trunk Assessment: Other  exceptions Cervical / Trunk Exceptions: obese; left lean in sitting   Communication Communication Communication: Expressive difficulties   Cognition Arousal/Alertness: Awake/alert Behavior During Therapy: Impulsive Overall Cognitive Status: Impaired/Different from baseline Area of Impairment: Attention;Following commands;Safety/judgement;Awareness;Problem solving   Current Attention Level: Sustained   Following Commands: Follows one step commands with increased time Safety/Judgement: Decreased awareness of safety;Decreased awareness of deficits Awareness: Intellectual Problem Solving: Requires verbal cues;Requires tactile cues General Comments: Patient with head turned to his right unless cued to look to his left (which he could then do). He neglected LUE and several times required prompt to "find his lt arm."   General Comments       Exercises       Shoulder Instructions      Home Living Family/patient expects to be discharged to:: Inpatient rehab                                        Prior Functioning/Environment Level of Independence: Needs assistance  Gait / Transfers Assistance Needed: used w/c for primary locomotion; w/c did not fit into bathroom, he installed grab bars and used prosthesis to walk in/out ADL's / Homemaking Assistance Needed: able to don R protestics             OT Problem List: Decreased strength;Decreased range of motion;Decreased activity tolerance;Impaired balance (sitting and/or standing);Decreased safety awareness;Decreased knowledge of use of DME or AE;Decreased knowledge of precautions;Impaired UE functional use;Impaired tone;Impaired sensation;Decreased cognition;Decreased coordination   OT Treatment/Interventions: Self-care/ADL training;Therapeutic exercise;Neuromuscular education;DME and/or AE instruction;Therapeutic activities;Balance training;Patient/family education    OT Goals(Current goals can be found in the care  plan section) Acute Rehab OT Goals Patient Stated Goal: wants to walk again OT Goal Formulation: With patient Time For Goal Achievement: 11/12/16 Potential to Achieve Goals: Good  OT Frequency: Min 3X/week   Barriers to D/C:            Co-evaluation PT/OT/SLP Co-Evaluation/Treatment: Yes Reason for Co-Treatment: Complexity of the patient's impairments (multi-system involvement);For patient/therapist safety   OT goals addressed during session: ADL's and self-care;Strengthening/ROM      End of Session Equipment Utilized During Treatment: Gait belt Nurse Communication: Mobility status;Precautions  Activity Tolerance: Patient tolerated treatment well Patient left: in bed;with call bell/phone within reach;with bed alarm set;with family/visitor present   Time: [id number] OT Time Calculation (min): 34 min Charges:  OT General Charges $OT Visit: 1 Procedure OT Evaluation $OT Eval High Complexity: 1 Procedure G-Codes:    Parke Poisson B 2016/11/22, 3:56 PM   Jeri Modena   OTR/L [phone] Office: [phone] .

## 2016-10-29 NOTE — Progress Notes (Signed)
STROKE TEAM PROGRESS NOTE   SUBJECTIVE (INTERVAL HISTORY) Wife and daughter are at bedside. He still has dense left sided hemiparesis. VVS consulted and did not recommend surgery.  Will maximize medical treatment and recommend 30 day cardiac event monitoring.    OBJECTIVE Temp:  [98.1 F (36.7 C)-98.8 F (37.1 C)] 98.1 F (36.7 C) (12/05 1610) Pulse Rate:  [81-95] 84 (12/05 1610) Cardiac Rhythm: Sinus tachycardia;Bundle branch block (12/05 1945) Resp:  [20] 20 (12/05 1610) BP: (171-203)/(68-80) 190/80 (12/05 1610) SpO2:  [92 %-97 %] 96 % (12/05 1610)  CBC:   Recent Labs Lab 10/27/16 2110 10/27/16 2118  WBC 7.7  --   NEUTROABS 5.0  --   HGB 14.6 13.6  HCT 41.1 40.0  MCV 94.5  --   PLT 238  --     Basic Metabolic Panel:   Recent Labs Lab 10/27/16 2110 10/27/16 2118 10/28/16 0146 10/28/16 1147  NA 137 138  --  138  K 3.1* 3.0*  --  3.1*  CL 101 100*  --  101  CO2 25  --   --  26  GLUCOSE 121* 126*  --  143*  BUN 11 13  --  11  CREATININE 0.94 1.00  --  1.07  CALCIUM 9.4  --   --  9.2  MG  --   --  2.0  --     Lipid Panel:     Component Value Date/Time   CHOL 251 (H) 10/28/2016 0146   CHOL 256 (H) 09/20/2016 1339   TRIG 139 10/28/2016 0146   HDL 43 10/28/2016 0146   HDL 42 09/20/2016 1339   CHOLHDL 5.8 10/28/2016 0146   VLDL 28 10/28/2016 0146   LDLCALC 180 (H) 10/28/2016 0146   LDLCALC 154 (H) 09/20/2016 1339   HgbA1c:  Lab Results  Component Value Date   HGBA1C 5.9 (H) 10/28/2016   Urine Drug Screen:     Component Value Date/Time   LABOPIA POSITIVE (A) 10/28/2016 1744   COCAINSCRNUR NONE DETECTED 10/28/2016 1744   LABBENZ NONE DETECTED 10/28/2016 1744   AMPHETMU NONE DETECTED 10/28/2016 1744   THCU NONE DETECTED 10/28/2016 1744   LABBARB NONE DETECTED 10/28/2016 1744      IMAGING I have personally reviewed the radiological images below and agree with the radiology interpretations.  Dg Chest 1 View 10/28/2016 Mild hypo inflation  accentuates the lung markings. However, there is likely low-grade CHF with mild interstitial edema. Mildly increased density in the right perihilar region may reflect subsegmental atelectasis. When the patient can tolerate the procedure, a PA and lateral chest x-ray with better inspiratory effort would be useful. Thoracic aortic atherosclerosis.   Ct Head Wo Contrast 10/27/2016 1. New focal hypodensity measuring approximately 2.5 cm in the right caudate, extending into the right lentiform nuclei, likely subacute infarction. No mass effect or midline shift. Recommend brain MRI to characterize. 2. Unchanged ventriculomegaly and remote infarctions involving the left corona radiata and both cerebellar hemispheres.   Ct Angio Head W Or Wo Contrast Ct Angio Neck W Or Wo Contrast 10/28/2016  1. Severe atherosclerosis. Negative for emergent large vessel occlusion. 2. Pseudoaneurysm of the proximal Brachiocephalic Artery (see series 8, image 121). Recommend Vascular Surgery consultation. 3. 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 No discrete right MCA branch occlusion identified although there is a paucity of enhancing anterior division M2/M3 branches. 4. Extensive Right carotid atherosclerosis without hemodynamically significant stenosis. 5.  High-grade RADIOGRAPHIC STRING SIGN STENOSIS of the Left ICA origin due to bulky calcified plaque. 6. 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 7. Severe stenosis Left PCA P2 segment. 8. Expected evolution on CT of the acute right basal ganglia infarcts. No associated hemorrhage or mass effect. 9. Emphysema.   Mr Brain Wo Contrast 10/28/2016 1. Two acute infarcts in the right lateral lenticulostriate distribution. 2. Chronic microvascular disease and small remote left frontal infarct. 3. Lateral and third ventriculomegaly, favor atrophy over communicating hydrocephalus.   2D  Echocardiogram  - Left ventricle: The cavity size was normal. Wall thickness was increased in a pattern of moderate LVH. Systolic function was vigorous. The estimated ejection fraction was in the range of 65% to 70%. Wall motion was normal; there were no regional wall motion abnormalities. Features are consistent with a pseudonormal left ventricular filling pattern, with concomitant abnormal relaxation and increased filling pressure (grade 2 diastolic dysfunction). - Aortic valve: Mildly calcified annulus. Mildly thickened, moderately calcified leaflets. There was mild to moderate regurgitation. Valve area (VTI): 1.14 cm^2. Valve area (Vmax): 1.09 cm^2. Valve area (Vmean): 1 cm^2. - Mitral valve: There was mild regurgitation.   PHYSICAL EXAM  Temp:  [98.1 F (36.7 C)-98.8 F (37.1 C)] 98.1 F (36.7 C) (12/05 1610) Pulse Rate:  [81-95] 84 (12/05 1610) Resp:  [20] 20 (12/05 1610) BP: (171-203)/(68-80) 190/80 (12/05 1610) SpO2:  [92 %-97 %] 96 % (12/05 1610)  General - Well nourished, well developed, in no apparent distress.  Ophthalmologic - Fundi not visualized due to eye movement.  Cardiovascular - Regular rate and rhythm.  Mental Status -  Level of arousal and orientation to time, place, and person were intact. Language including expression, naming, repetition, comprehension was assessed and found intact. Moderate dysarthria Fund of Knowledge was assessed and was intact.  Cranial Nerves II - XII - II - Visual field intact OU. III, IV, VI - Extraocular movements intact. V - Facial sensation intact bilaterally. VII - left facial droop. VIII - Hearing & vestibular intact bilaterally. X - Palate elevates symmetrically. XI - Chin turning & shoulder shrug intact bilaterally. XII - Tongue protrusion intact.  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  Bulk was normal and  fasciculations were absent.   Motor Tone - Muscle tone was assessed at the neck and appendages and was normal.  Reflexes - The patient's reflexes were 1+ in all extremities and he had no pathological reflexes.  Sensory - Light touch, temperature/pinprick were assessed and were symmetrical.    Coordination - The patient had normal movements in the right hand with no ataxia or dysmetria.  Tremor was absent.  Gait and Station - not tested due to weakness   ASSESSMENT/PLAN [name] Cook is a 75 y.o. 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 He did not receive IV t-PA due to delay in arrival.   Stroke:  Non-dominant right basal ganglia infarct - etiology uncertain.  1. Most likely due to intracranial / extracranial atherosclerosis - diffuse intracranial stenosis on CTA head, tandem stenosis right M1 and bifurcation, non flowing limiting calcified plaques at ICA origin and ICA siphone 2. 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 cardioembolic - pt denies heart palpitation, will recommend 30 day cardiac  event monitoring   Resultant  Left hemiparesis, left facial droop  MRI  R lateral lenticulostriate infarcts. Old small L frontal infarct.   CTA head and neck - severe atherosclerosis. Pseudoaneurysm of proximal brachiocephalic artery. Mod to severe stenosis R M1 and MCA bifurcations, L V4, L P2. L ICA string sign.   2D Echo  EF 65-70%. No source of embolus   Recommend 30 day cardiac event monitoring as outpt  LDL 180  HgbA1c 5.9  Lovenox 40 mg sq daily for VTE prophylaxis DIET DYS 2 Room service appropriate? Yes; Fluid consistency: Honey Thick  aspirin 81 mg daily prior to admission, now on DAPT. Continue DAPT for 3 months and then plavix alone.    Patient counseled to be compliant with his antithrombotic medications  Ongoing aggressive stroke risk factor  management  Therapy recommendations:  pending   Disposition:  pending   Left ICA high grade stenosis, asymptomatic  Pt no hx of stroke in the past  No left stroke this time  MRI did showed remote left MCA small infarct  VVS follow up as outpt to consider elective left CEA if pt recovers well from current stroke.  Hypertension  Elevated, as high as 207-64 From stroke standpoint, permissive hypertension (OK if < 220/120) but gradually normalize in 5-7 days Long-term BP goal 130-150 due to left ICA stenosis  Hyperlipidemia  Home meds:  lipitor 40, resumed in hospital. Also listed on crestor 10  LDL 180, goal < 70  lipitor increased in hospital to 80 mg daily  Continue statin at discharge  Other Stroke Risk Factors  Advanced age  Former Cigarette smoker  ETOH use, advised to drink no more than 2 drink(s) a day  Obesity, Body mass index is 30.84 kg/m., recommend weight loss, diet and exercise as appropriate   CAD s/p CABG with subsequent stents 2014  PVD s/p RLE bypass and post op infection causing right LE AKA  Other Active Problems  Hypokalemia  GERD  Hospital day # 1   Neurology will sign off. Please call with questions. Pt will follow up with Dr. Erlinda Hong at [facility] in about 6 weeks. Thanks for the consult.   Rosalin Hawking, MD PhD Stroke Neurology 10/29/2016 10:05 PM   To contact Stroke Continuity provider, please refer to [email or web address] After hours, contact General Neurology

## 2016-10-29 NOTE — Consult Note (Signed)
Physical Medicine and Rehabilitation Consult Reason for Consult: Right basal ganglia infarct Referring Physician: [facility]   HPI: [name] Cook is a 75 y.o. 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 Per chart review patient lives with spouse. Use wheelchair for primary locomotion and did have a prosthesis for right BKA. Presented 10/27/2016 with fall and reported left-sided weakness and slurred speech. Denied loss of consciousness. No recent headaches reported. CT/MRI showed 2 acute infarcts in the right lateral lenticulostriate distribution. CT angiogram head and neck negative for emergent large vessel occlusion. Pseudoaneurysm of the proximal brachiocephalic artery. Moderate to severe tandem stenosis of the right MCA mid M1 segment and at the right MCA bifurcation as well as high-grade radiographic string sign stenosis of the left ICA. Patient did not receive TPA. Echocardiogram with ejection fraction of [id number] grade 2 diastolic dysfunction. Vascular surgery consulted as well as plan neuro interventional radiology to consider formal cerebral arteriography for further evaluation of innominate artery pseudoaneurysm. Neurology consulted presently on aspirin for CVA prophylaxis. Subcutaneous Lovenox for DVT prophylaxis. Hospital course hypokalemia with supplement added. Physical therapy evaluation completed with recommendations of physical medicine rehabilitation consult.   Review of Systems  Constitutional: Negative for chills and fever.  HENT: Negative for hearing loss and tinnitus.   Eyes: Negative for blurred vision and double vision.  Respiratory: Negative for cough and shortness of breath.   Cardiovascular: Negative for chest pain.  Gastrointestinal: Positive for constipation. Negative for nausea and vomiting.       GERD Hiccups  Genitourinary: Positive for urgency.  Negative for dysuria and hematuria.  Skin: Negative for rash.  Neurological: Positive for speech change, focal weakness and weakness. Negative for tremors, sensory change, seizures and loss of consciousness.  All other systems reviewed and are negative.  Past Medical History:  Diagnosis Date  . Arthritis   . CAD (coronary artery disease)    2014 75% left main stenosis, LAD 80% followed by a mid subtotal stenosis, circumflex 90% stenosis, right coronary artery occluded, SVG to RCA occluded, SVG ramus occluded, SVG to OM patent without a sequential anastomosis noted. Graft had a focal 99% stenosis which was stented. 3.5 x 16 mm premier. b. cath 10/13/2014 DES to SVG to OM overlap previous stent  . Cholelithiasis 2014  . Cogan syndrome   . Colon polyps   . Diabetes mellitus without complication ([place])   . Dyslipidemia   . Gallstones   . GERD (gastroesophageal reflux disease)   . Hepatic hemangioma 2014  . Hx of right BKA ([place])    a. 2/2 necrotizing fasciitis.  . Hypertension   . Kidney stones   . Myocardial infarction 1999  . Necrotizing fasciitis ([place])   . Peptic ulcer disease   . Peripheral vascular disease [facility])    Past Surgical History:  Procedure Laterality Date  . APPENDECTOMY  2007  . CORONARY ARTERY BYPASS GRAFT  1999   LIMA to the LAD, saphenous vein graft to obtuse marginal 1 and obtuse marginal 2, spahenous vein graft to intermediate, spahenous vein graft to the right coronary artery in 1999  . LEFT HEART CATHETERIZATION WITH CORONARY ANGIOGRAM N/A 10/13/2014   Procedure: LEFT HEART CATHETERIZATION WITH CORONARY ANGIOGRAM;  Surgeon: Peter M Martinique, MD;  Location: [facility] CATH LAB;  Service: Cardiovascular;  Laterality: N/A;  . LEFT HEART CATHETERIZATION WITH CORONARY/GRAFT ANGIOGRAM N/A 07/19/2013   Procedure: LEFT  HEART CATHETERIZATION WITH Beatrix Fetters;  Surgeon: Peter M Martinique, MD;  Location: [facility] CATH LAB;  Service: Cardiovascular;  Laterality: N/A;  . LEG  AMPUTATION  1999   Right BKA  . PERCUTANEOUS CORONARY STENT INTERVENTION (PCI-S)  07/19/2013   Procedure: PERCUTANEOUS CORONARY STENT INTERVENTION (PCI-S);  Surgeon: Peter M Martinique, MD;  Location: [facility] CATH LAB;  Service: Cardiovascular;;   Family History  Problem Relation Age of Onset  . Hyperlipidemia Mother   . Hypertension Mother   . Other Mother     varicose veins  . Heart disease Mother   . Rheum arthritis Father   . Heart attack Father   . Heart disease Maternal Grandfather   . Diabetes Sister   . Colon cancer Neg Hx    Social History:  reports that he quit smoking about 18 years ago. His smoking use included Cigarettes. He started smoking about 53 years ago. He has never used smokeless tobacco. He reports that he drinks about 8.4 oz of alcohol per week . He reports that he does not use drugs. Allergies:  Allergies  Allergen Reactions  . Altace [Ramipril] Other (See Comments)    hypotension   Medications Prior to Admission  Medication Sig Dispense Refill  . acetaminophen (TYLENOL) 500 MG tablet Take 1,000 mg by mouth every 6 (six) hours as needed for mild pain.     Marland Kitchen amLODipine (NORVASC) 5 MG tablet Take 1 tablet by mouth  daily 90 tablet 3  . aspirin 81 MG tablet Take 81 mg by mouth daily.      Marland Kitchen atorvastatin (LIPITOR) 40 MG tablet Take 1 tablet (40 mg total) by mouth daily. 90 tablet 3  . Calcium Carbonate-Vitamin D (CALCIUM-CARB 600 + D) 600-125 MG-UNIT TABS Take 1 tablet by mouth daily.      . chlorthalidone (HYGROTON) 25 MG tablet TAKE 1 TABLET (25 MG TOTAL) BY MOUTH DAILY. 90 tablet 1  . cloNIDine (CATAPRES) 0.1 MG tablet TAKE 1 TABLET AS NEEDED FOR BLOOD PRESSURES GREATER THAN 160/100 90 tablet 1  . doxylamine, Sleep, (UNISOM) 25 MG tablet Take 25 mg by mouth at bedtime.    . finasteride (PROSCAR) 5 MG tablet Take 5 mg by mouth daily.    Marland Kitchen lisinopril (PRINIVIL,ZESTRIL) 20 MG tablet TAKE 1 TABLET (20 MG TOTAL) BY MOUTH DAILY. 90 tablet 1  . metoprolol succinate  (TOPROL-XL) 50 MG 24 hr tablet TAKE 1 TABLET BY MOUTH DAILY WITH OR IMMEDIATELY FOLLOWING A MEAL 90 tablet 1  . Multiple Vitamin (MULTIVITAMIN) tablet Take 1 tablet by mouth daily.      . nitroGLYCERIN (NITROSTAT) 0.4 MG SL tablet Place 1 tablet (0.4 mg total) under the tongue every 5 (five) minutes as needed for chest pain. 25 tablet 3  . Probiotic Product (RESTORA) CAPS Take 1 capsule by mouth daily. 30 capsule 6  . ranitidine (ZANTAC) 75 MG tablet Take 75-150 mg by mouth See admin instructions. 75 mg in the am, 150 mg at bedtime    . tamsulosin (FLOMAX) 0.4 MG CAPS Take 0.8 mg by mouth daily.     Marland Kitchen EFFIENT 10 MG TABS tablet Take 1 tablet by mouth  daily (Patient not taking: Reported on 10/27/2016) 90 tablet 2  . metFORMIN (GLUCOPHAGE) 500 MG tablet Take 1 tablet by mouth  daily (Patient not taking: Reported on 10/27/2016) 90 tablet 3  . rosuvastatin (CRESTOR) 10 MG tablet Take 1 tablet (10 mg total) by mouth daily. (Patient not taking: Reported on 10/27/2016) 90 tablet 3  Home: Home Living Family/patient expects to be discharged to:: Private residence Living Arrangements: Spouse/significant other Type of Home: House Home Equipment: Wheelchair - manual  Lives With: Spouse  Functional History: Prior Function Level of Independence: Needs assistance Gait / Transfers Assistance Needed: used w/c for primary locomotion; w/c did not fit into bathroom, he installed grab bars and used prosthesis to walk in/out Functional Status:  Mobility: Bed Mobility Overal bed mobility: Needs Assistance, +2 for physical assistance Bed Mobility: Rolling, Sidelying to Sit, Sit to Supine Rolling: Total assist, +2 for physical assistance (Rt) Sidelying to sit: Max assist, +2 for physical assistance, HOB elevated, +2 for safety/equipment Sit to supine: Max assist, +2 for physical assistance, +2 for safety/equipment General bed mobility comments: rolled to Rt x 2; second attempt pt able to reach for his left elbow  and bring LUE partially across body; using RUE on bedrail; pt assisted with directing his torso as returning to supine Transfers Overall transfer level: Needs assistance Equipment used: None Transfers: Sit to/from Stand General transfer comment: attempted from elevated EOB x 2 with 2 person moderate assist and only able to clear his hips ~4" off mattress; retruned to sit EOB      ADL:    Cognition: Cognition Overall Cognitive Status: Impaired/Different from baseline Arousal/Alertness: Awake/alert Orientation Level: Oriented X4 Attention: Sustained Sustained Attention: Impaired Sustained Attention Impairment: Verbal basic Memory: Impaired Memory Impairment: Decreased recall of new information Awareness: Impaired Awareness Impairment: Emergent impairment, Anticipatory impairment Problem Solving: Impaired Problem Solving Impairment: Verbal basic, Functional basic Safety/Judgment: Impaired Cognition Arousal/Alertness: Awake/alert Behavior During Therapy: Impulsive Overall Cognitive Status: Impaired/Different from baseline Area of Impairment: Attention, Following commands, Safety/judgement, Awareness, Problem solving Current Attention Level: Sustained Following Commands: Follows one step commands with increased time Safety/Judgement: Decreased awareness of safety, Decreased awareness of deficits Awareness: Intellectual Problem Solving: Requires verbal cues, Requires tactile cues General Comments: Patient with head turned to his right unless cued to look to his left (which he could then do). He neglected LUE and several times required prompt to "find his lt arm."  Blood pressure (!) 171/68, pulse 81, temperature 98.8 F (37.1 C), temperature source Axillary, resp. rate 20, height 5\' 8"  (1.727 m), weight 92 kg (202 lb 12.8 oz), SpO2 95 %. Physical Exam  Vitals reviewed. Constitutional: He appears well-developed and well-nourished.  HENT:  Head: Normocephalic and atraumatic.    Eyes: Conjunctivae and EOM are normal.  Neck: Normal range of motion. Neck supple. No thyromegaly present.  Cardiovascular: Normal rate and regular rhythm.   Respiratory: No respiratory distress.  Upper airway congestion with limited inspiratory effort  GI: Soft. Bowel sounds are normal. He exhibits no distension. There is no tenderness.  Musculoskeletal: He exhibits no edema or tenderness.  Right BKA  Neurological: He is alert.  Patient is able to provide his name and age.  Speech is a bit dysarthric but intelligible.  Follows simple commands Left facial weakness Sensation intact to light touch throughout Motor: LUE/LLE: 0/5 RUE: 5/5 proximal to distal RLE: HF 5/5  Skin: Skin is warm and dry.  Psychiatric: He has a normal mood and affect. His behavior is normal.    Results for orders placed or performed during the hospital encounter of 10/27/16 (from the past 24 hour(s))  Basic metabolic panel     Status: Abnormal   Collection Time: 10/28/16 11:47 AM  Result Value Ref Range   Sodium 138 135 - 145 mmol/L   Potassium 3.1 (L) 3.5 - 5.1 mmol/L  Chloride 101 101 - 111 mmol/L   CO2 26 22 - 32 mmol/L   Glucose, Bld 143 (H) 65 - 99 mg/dL   BUN 11 6 - 20 mg/dL   Creatinine, Ser 1.07 0.61 - 1.24 mg/dL   Calcium 9.2 8.9 - 10.3 mg/dL   GFR calc non Af Amer >60 >60 mL/min   GFR calc Af Amer >60 >60 mL/min   Anion gap 11 5 - 15  Glucose, capillary     Status: Abnormal   Collection Time: 10/28/16  1:19 PM  Result Value Ref Range   Glucose-Capillary 127 (H) 65 - 99 mg/dL  Glucose, capillary     Status: Abnormal   Collection Time: 10/28/16  4:45 PM  Result Value Ref Range   Glucose-Capillary 122 (H) 65 - 99 mg/dL  Urine rapid drug screen (hosp performed)not at [facility]     Status: Abnormal   Collection Time: 10/28/16  5:44 PM  Result Value Ref Range   Opiates POSITIVE (A) NONE DETECTED   Cocaine NONE DETECTED NONE DETECTED   Benzodiazepines NONE DETECTED NONE DETECTED   Amphetamines  NONE DETECTED NONE DETECTED   Tetrahydrocannabinol NONE DETECTED NONE DETECTED   Barbiturates NONE DETECTED NONE DETECTED  Urinalysis, Routine w reflex microscopic (not at [facility])     Status: Abnormal   Collection Time: 10/28/16  5:44 PM  Result Value Ref Range   Color, Urine YELLOW YELLOW   APPearance CLEAR CLEAR   Specific Gravity, Urine >1.046 (H) 1.005 - 1.030   pH 6.0 5.0 - 8.0   Glucose, UA NEGATIVE NEGATIVE mg/dL   Hgb urine dipstick NEGATIVE NEGATIVE   Bilirubin Urine NEGATIVE NEGATIVE   Ketones, ur 15 (A) NEGATIVE mg/dL   Protein, ur 30 (A) NEGATIVE mg/dL   Nitrite NEGATIVE NEGATIVE   Leukocytes, UA NEGATIVE NEGATIVE  Urine microscopic-add on     Status: Abnormal   Collection Time: 10/28/16  5:44 PM  Result Value Ref Range   Squamous Epithelial / LPF 0-5 (A) NONE SEEN   WBC, UA 0-5 0 - 5 WBC/hpf   RBC / HPF 0-5 0 - 5 RBC/hpf   Bacteria, UA RARE (A) NONE SEEN   Casts HYALINE CASTS (A) NEGATIVE   Urine-Other MUCOUS PRESENT   Glucose, capillary     Status: Abnormal   Collection Time: 10/28/16  8:43 PM  Result Value Ref Range   Glucose-Capillary 120 (H) 65 - 99 mg/dL   Comment 1 Notify RN    Comment 2 Document in Chart   Glucose, capillary     Status: None   Collection Time: 10/29/16 12:08 AM  Result Value Ref Range   Glucose-Capillary 99 65 - 99 mg/dL   Comment 1 Notify RN    Comment 2 Document in Chart   Glucose, capillary     Status: Abnormal   Collection Time: 10/29/16  4:26 AM  Result Value Ref Range   Glucose-Capillary 109 (H) 65 - 99 mg/dL   Comment 1 Notify RN    Comment 2 Document in Chart    Ct Angio Head W Or Wo Contrast  Result Date: 10/28/2016 CLINICAL DATA:  75 year old male with right lenticulostriate territory acute infarcts on MRI performed for a left side weakness and fall. Initial encounter. EXAM: CT ANGIOGRAPHY HEAD AND NECK TECHNIQUE: Multidetector CT imaging of the head and neck was performed using the standard protocol during bolus  administration of intravenous contrast. Multiplanar CT image reconstructions and MIPs were obtained to evaluate the vascular anatomy. Carotid stenosis  measurements (when applicable) are obtained utilizing NASCET criteria, using the distal internal carotid diameter as the denominator. CONTRAST:  50 mL Isovue 370 COMPARISON:  Brain MRI 0956 hours today and head CT 10/27/2016. FINDINGS: CTA NECK Skeleton: Prior median sternotomy. No acute osseous abnormality identified. Visualized paranasal sinuses and mastoids are stable and well pneumatized. Upper chest: Centrilobular emphysema. Mild nonspecific dependent pulmonary opacity. No superior mediastinal lymphadenopathy. Other neck: Negative thyroid, larynx, pharynx, parapharyngeal spaces, retropharyngeal space, sublingual space, submandibular glands and parotid glands. No cervical lymphadenopathy. Negative orbit and scalp soft tissues. Aortic arch: Sequelae of CABG. Extensive arch calcified atherosclerosis. Three vessel arch configuration. Right carotid system: 7 mm diameter pseudoaneurysm along the proximal (right lateral) aspect of the brachia a cephalic artery as seen on series 8, image 121 and series 5, image 162. No associated stenosis of the proximal brachiocephalic. Soft and calcified plaque of the brachiocephalic artery. No right CCA origin despite soft plaque. Soft and calcified plaque intermittently in the right CCA without stenosis proximal to the right carotid bifurcation. At the bifurcation there is calcified plaque with less than 50 % stenosis with respect to the distal vessel of the proximal right ICA. Calcified plaque in the distal cervical right ICA just below the skullbase also without stenosis. Left carotid system: No left CCA origin stenosis. Intermittent soft and calcified plaque in the left CCA without stenosis proximal to the bifurcation. At the left carotid bifurcation there is bulky in confluent calcified plaque resulting in a Radiographic String  Sign Stenosis of the left ICA origin as seen on series 7, image 215 and series 8, image 102. Calcified plaque continues into the left ICA bulb. Negative cervical left ICA otherwise. Vertebral arteries:Soft and calcified plaque in the proximal right subclavian artery without stenosis. Soft plaque at the right vertebral artery origin without hemodynamically significant stenosis (series 7, image 298). Calcified plaque in the right V1 segment, and at the skullbase without associated stenosis. Soft and calcified plaque in the proximal left subclavian artery without hemodynamically significant stenosis. Soft plaque at the left vertebral artery origin without stenosis (series 8, image 146). Intermittent soft and calcified plaque in the left V2 segment with up to mild stenosis. CTA HEAD Posterior circulation: Bilateral distal vertebral artery calcified plaque. No hemodynamically significant stenosis of the distal right vertebral artery, but there is up severe tandem stenosis of the left vertebral artery V4 segment (series 7, image 151) in the left PICA region and at the vertebrobasilar junction. The basilar remains patent. Both AICA origins appear patent. No basilar stenosis. Mild basilar regularity. Normal SCA and PCA origins. Posterior communicating arteries are diminutive or absent. Mild right PCA irregularity. Severe left PCA P2 segment stenosis best seen on series 10, image 23 with preserved distal enhancement. Anterior circulation: Both ICA siphons are patent, but there is severe calcified siphon atherosclerosis. Still, there is no definite siphon hemodynamically significant stenosis. Normal ophthalmic artery origins. Patent carotid termini. Patent ACA origins. Mild irregularity and stenosis left ACA A1 segment. Anterior communicating artery and bilateral ACA branches are within normal limits. Left MCA origin and M1 segment are normal. Left MCA bifurcation is patent. Mild irregularity of the left MCA M3 branches.  Right MCA origin is patent without stenosis, but there is moderate to severe stenosis in the mid right M1 segment and again at the right MCA bifurcation (series 16, image 26). No right discrete MCA branch occlusion is identified although there are is a paucity of enhancing anterior division right M2 branches (series 12,  image 12). Venous sinuses: Patent. Anatomic variants: None. Delayed phase: Mild progression of confluent right basal ganglia hypodensity since the head CT yesterday. No associated hemorrhage or mass effect. Otherwise stable gray-white matter differentiation. No superimposed acute cortically based infarct identified. No abnormal enhancement identified. Review of the MIP images confirms the above findings IMPRESSION: 1. Severe atherosclerosis. Negative for emergent large vessel occlusion. 2. Pseudoaneurysm of the proximal Brachiocephalic Artery (see series 8, image 121). Recommend Vascular Surgery consultation. 3. 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 No discrete right MCA branch occlusion identified although there is a paucity of enhancing anterior division M2/M3 branches. 4. Extensive Right carotid atherosclerosis without hemodynamically significant stenosis. 5. High-grade RADIOGRAPHIC STRING SIGN STENOSIS of the Left ICA origin due to bulky calcified plaque. 6. 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 7. Severe stenosis Left PCA P2 segment. 8. Expected evolution on CT of the acute right basal ganglia infarcts. No associated hemorrhage or mass effect. 9. Emphysema. Electronically Signed   By: Genevie Ann M.D.   On: 10/28/2016 13:00   Dg Chest 1 View  Result Date: 10/28/2016 CLINICAL DATA:  CVA with left-sided weakness. The patient ports nausea and vomiting. History of diabetes, hypertension, previous CP 80. EXAM: CHEST 1 VIEW COMPARISON:  Chest x-ray of October 12, 2014 FINDINGS: The lungs are less well  inflated today. There is patchy density in the right perihilar region. There is no alveolar infiltrate. There is no pleural effusion. The cardiac silhouette is enlarged. There is some crowding of the central pulmonary vascularity. The patient has undergone previous CABG. There is calcification in the wall of the thoracic aorta. The observed bony structures exhibit no acute abnormalities. IMPRESSION: Mild hypo inflation accentuates the lung markings. However, there is likely low-grade CHF with mild interstitial edema. Mildly increased density in the right perihilar region may reflect subsegmental atelectasis. When the patient can tolerate the procedure, a PA and lateral chest x-ray with better inspiratory effort would be useful. Thoracic aortic atherosclerosis. Electronically Signed   By: David  Martinique M.D.   On: 10/28/2016 11:46   Ct Head Wo Contrast  Result Date: 10/27/2016 CLINICAL DATA:  Left-sided weakness.  Multiple recent falls. EXAM: CT HEAD WITHOUT CONTRAST TECHNIQUE: Contiguous axial images were obtained from the base of the skull through the vertex without intravenous contrast. COMPARISON:  01/25/2016 FINDINGS: Brain: Negative for acute intracranial hemorrhage or extra-axial fluid collection. There is focal hypodensity in the right caudate extending into the right lentiform nuclei, likely subacute infarction. No significant mass effect. Unchanged ventriculomegaly. Remote left corona radiata infarction, unchanged. Probable remote cerebellar infarctions, unchanged. Vascular: No hyperdense vessel or unexpected calcification. Skull: Normal. Negative for fracture or focal lesion. Sinuses/Orbits: No acute finding. IMPRESSION: 1. New focal hypodensity measuring approximately 2.5 cm in the right caudate, extending into the right lentiform nuclei, likely subacute infarction. No mass effect or midline shift. Recommend brain MRI to characterize. 2. Unchanged ventriculomegaly and remote infarctions involving the  left corona radiata and both cerebellar hemispheres. Electronically Signed   By: Andreas Newport M.D.   On: 10/27/2016 23:17   Ct Angio Neck W Or Wo Contrast  Result Date: 10/28/2016 CLINICAL DATA:  75 year old male with right lenticulostriate territory acute infarcts on MRI performed for a left side weakness and fall. Initial encounter. EXAM: CT ANGIOGRAPHY HEAD AND NECK TECHNIQUE: Multidetector CT imaging of the head and neck was performed using the standard protocol during  bolus administration of intravenous contrast. Multiplanar CT image reconstructions and MIPs were obtained to evaluate the vascular anatomy. Carotid stenosis measurements (when applicable) are obtained utilizing NASCET criteria, using the distal internal carotid diameter as the denominator. CONTRAST:  50 mL Isovue 370 COMPARISON:  Brain MRI 0956 hours today and head CT 10/27/2016. FINDINGS: CTA NECK Skeleton: Prior median sternotomy. No acute osseous abnormality identified. Visualized paranasal sinuses and mastoids are stable and well pneumatized. Upper chest: Centrilobular emphysema. Mild nonspecific dependent pulmonary opacity. No superior mediastinal lymphadenopathy. Other neck: Negative thyroid, larynx, pharynx, parapharyngeal spaces, retropharyngeal space, sublingual space, submandibular glands and parotid glands. No cervical lymphadenopathy. Negative orbit and scalp soft tissues. Aortic arch: Sequelae of CABG. Extensive arch calcified atherosclerosis. Three vessel arch configuration. Right carotid system: 7 mm diameter pseudoaneurysm along the proximal (right lateral) aspect of the brachia a cephalic artery as seen on series 8, image 121 and series 5, image 162. No associated stenosis of the proximal brachiocephalic. Soft and calcified plaque of the brachiocephalic artery. No right CCA origin despite soft plaque. Soft and calcified plaque intermittently in the right CCA without stenosis proximal to the right carotid bifurcation. At  the bifurcation there is calcified plaque with less than 50 % stenosis with respect to the distal vessel of the proximal right ICA. Calcified plaque in the distal cervical right ICA just below the skullbase also without stenosis. Left carotid system: No left CCA origin stenosis. Intermittent soft and calcified plaque in the left CCA without stenosis proximal to the bifurcation. At the left carotid bifurcation there is bulky in confluent calcified plaque resulting in a Radiographic String Sign Stenosis of the left ICA origin as seen on series 7, image 215 and series 8, image 102. Calcified plaque continues into the left ICA bulb. Negative cervical left ICA otherwise. Vertebral arteries:Soft and calcified plaque in the proximal right subclavian artery without stenosis. Soft plaque at the right vertebral artery origin without hemodynamically significant stenosis (series 7, image 298). Calcified plaque in the right V1 segment, and at the skullbase without associated stenosis. Soft and calcified plaque in the proximal left subclavian artery without hemodynamically significant stenosis. Soft plaque at the left vertebral artery origin without stenosis (series 8, image 146). Intermittent soft and calcified plaque in the left V2 segment with up to mild stenosis. CTA HEAD Posterior circulation: Bilateral distal vertebral artery calcified plaque. No hemodynamically significant stenosis of the distal right vertebral artery, but there is up severe tandem stenosis of the left vertebral artery V4 segment (series 7, image 151) in the left PICA region and at the vertebrobasilar junction. The basilar remains patent. Both AICA origins appear patent. No basilar stenosis. Mild basilar regularity. Normal SCA and PCA origins. Posterior communicating arteries are diminutive or absent. Mild right PCA irregularity. Severe left PCA P2 segment stenosis best seen on series 10, image 23 with preserved distal enhancement. Anterior circulation:  Both ICA siphons are patent, but there is severe calcified siphon atherosclerosis. Still, there is no definite siphon hemodynamically significant stenosis. Normal ophthalmic artery origins. Patent carotid termini. Patent ACA origins. Mild irregularity and stenosis left ACA A1 segment. Anterior communicating artery and bilateral ACA branches are within normal limits. Left MCA origin and M1 segment are normal. Left MCA bifurcation is patent. Mild irregularity of the left MCA M3 branches. Right MCA origin is patent without stenosis, but there is moderate to severe stenosis in the mid right M1 segment and again at the right MCA bifurcation (series 16, image 26). No right discrete  MCA branch occlusion is identified although there are is a paucity of enhancing anterior division right M2 branches (series 12, image 12). Venous sinuses: Patent. Anatomic variants: None. Delayed phase: Mild progression of confluent right basal ganglia hypodensity since the head CT yesterday. No associated hemorrhage or mass effect. Otherwise stable gray-white matter differentiation. No superimposed acute cortically based infarct identified. No abnormal enhancement identified. Review of the MIP images confirms the above findings IMPRESSION: 1. Severe atherosclerosis. Negative for emergent large vessel occlusion. 2. Pseudoaneurysm of the proximal Brachiocephalic Artery (see series 8, image 121). Recommend Vascular Surgery consultation. 3. 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 No discrete right MCA branch occlusion identified although there is a paucity of enhancing anterior division M2/M3 branches. 4. Extensive Right carotid atherosclerosis without hemodynamically significant stenosis. 5. High-grade RADIOGRAPHIC STRING SIGN STENOSIS of the Left ICA origin due to bulky calcified plaque. 6. 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 7. Severe stenosis Left  PCA P2 segment. 8. Expected evolution on CT of the acute right basal ganglia infarcts. No associated hemorrhage or mass effect. 9. Emphysema. Electronically Signed   By: Genevie Ann M.D.   On: 10/28/2016 13:00   Mr Brain Wo Contrast  Result Date: 10/28/2016 CLINICAL DATA:  Left-sided weakness.  Fall at home. EXAM: MRI HEAD WITHOUT CONTRAST TECHNIQUE: Multiplanar, multiecho pulse sequences of the brain and surrounding structures were obtained without intravenous contrast. COMPARISON:  Head CT from yesterday FINDINGS: Brain: Restricted diffusion in a wedge-shaped area extending from the right anterior putamen through the anterior limb internal capsule to the caudate head. Distinct second area of acute infarct or posteriorly with the same morphology, extending from putamen to caudate body. No acute hemorrhage. Chronic microvascular disease with moderate ischemic gliosis throughout cerebral white matter. Small remote left frontal infarct at the vertex. There is lateral and third ventriculomegaly that is likely from atrophy. No temporal horn dilatation typical of an obstructive for communicating hydrocephalus. Vascular: Preserved flow voids.  No focal marrow lesion. Skull and upper cervical spine: Negative Sinuses/Orbits: Negative IMPRESSION: 1. Two acute infarcts in the right lateral lenticulostriate distribution. 2. Chronic microvascular disease and small remote left frontal infarct. 3. Lateral and third ventriculomegaly, favor atrophy over communicating hydrocephalus. Electronically Signed   By: Monte Fantasia M.D.   On: 10/28/2016 11:24    Assessment/Plan: Diagnosis: Right basal ganglia infarct Labs and images independently reviewed.  Records reviewed and summated above. Stroke: Continue secondary stroke prophylaxis and Risk Factor Modification listed below:   Antiplatelet therapy:   Blood Pressure Management:  Continue current medication with prn's with permisive HTN per primary team Statin Agent:     Prediabetes management:   Left sided hemiparesis: fit for orthosis to prevent contractures (resting hand splint for day, wrist cock up splint at night, PRAFO, etc) Motor recovery: Fluoxetine  1. Does the need for close, 24 hr/day medical supervision in concert with the patient's rehab needs make it unreasonable for this patient to be served in a less intensive setting? Yes  2. Co-Morbidities requiring supervision/potential complications: CAD s/p CABG (cont meds), HTN (monitor and provide prns in accordance with increased physical exertion and pain), right BKA (cont prosthesis), hypokalemia (continue to monitor and replete as necessary), ?UTI (treat if necessary), Carotid artery stenosis (plans per vascular/neurointerventional radiology due to complexity) 3. Due to bladder management, safety, skin/wound care, disease management and patient education, does the patient require 24 hr/day rehab  nursing? Yes 4. Does the patient require coordinated care of a physician, rehab nurse, PT (1-2 hrs/day, 5 days/week), OT (1-2 hrs/day, 5 days/week) and SLP (1-2 hrs/day, 5 days/week) to address physical and functional deficits in the context of the above medical diagnosis(es)? Yes Addressing deficits in the following areas: balance, endurance, locomotion, strength, transferring, bowel/bladder control, bathing, dressing, toileting, speech, swallowing and psychosocial support 5. Can the patient actively participate in an intensive therapy program of at least 3 hrs of therapy per day at least 5 days per week? Yes 6. The potential for patient to make measurable gains while on inpatient rehab is fair 7. Anticipated functional outcomes upon discharge from inpatient rehab are max assist  with PT, max assist with OT, supervision and min assist with SLP. 8. Estimated rehab length of stay to reach the above functional goals is: 16-19 days. 9. Does the patient have adequate social supports and living environment to accommodate  these discharge functional goals? Potentially 10. Anticipated D/C setting: Home 11. Anticipated post D/C treatments: HH therapy and Home excercise program 12. 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  Wife will be unable to care for him by herself at discharge.  If additional caregiver support unavailable, would consider SNF after completion of medical workup.   Patient has agreed to participate in recommended program. Potentially Note that insurance prior authorization may be required for reimbursement for recommended care.  Comment: Rehab Admissions Coordinator to follow up.  Delice Lesch, MD, [facility] 10/29/2016

## 2016-10-29 NOTE — Progress Notes (Signed)
PROGRESS NOTE    [name] Cook  [id number] DOB: May 11, 1941 DOA: 10/27/2016 PCP: Wardell Honour, MD   Outpatient Specialists:     Brief Narrative:  [name] Cook is a 75 y.o. male with medical history significant for coronary artery disease status post CABG and subsequent graft stent, type 2 diabetes mellitus, hypertension, hyperlipidemia, and GERD who presents to the ED with weakness involving the left face, left arm, and left leg. Patient reports that he had been in his usual state of health until he fell at home this morning at approximately 9 AM. He had difficulty getting up from the floor, noting that his left arm and leg were weak. He later realized that his left face was also weak and drooping. He denies any associated chest pain or palpitations and denies any recent headaches, change in vision or hearing, loss of coordination, or numbness. He denies striking his head when he fell and denies ever experiencing similar symptoms previously. There has been no recent fevers or chills, no chest pain or palpitations, and no cough or dyspnea.   Assessment & Plan:   Principal Problem:   Ischemic stroke [facility]) Active Problems:   Dyslipidemia   Essential hypertension   Coronary atherosclerosis   GERD (gastroesophageal reflux disease)   Hypokalemia   Diabetes mellitus with circulatory complication (HCC)   Left-sided weakness   Acute ischemic stroke (HCC)   CVA (cerebral vascular accident) ([place])   Bilateral carotid artery stenosis   Cerebral infarction ([place])   Hypertensive crisis   Acute lower UTI   Stenosis of carotid artery   Prediabetes   Ischemic CVA with left-sided weakness  - MRI positive -CTA with psuedo anyersym-- vascular consulted- ? IR consult- will defer to neurology -outpatient follow up for left carotid artery  - Neurology consult -for swallow eval in AM  Coronary artery disease  -- He is managed with ASA, Lipitor, Toprol, and lisinopril at home; ASA  will be continued PR as stroke ppx, but remaining medications held on admission d/t NPO status and permissive HTN   Hypertension  - Pt is markedly hypertensive on admission   - He is managed with chlorthalidone, clonidine, lisinopril, Norvasc, and Toprol at home; these are held on admission  - Treat SBP >220 or DBP >110 with prn labetalol IVPs  - Resume home antihypertensives once outside acute-phase of CVA   Hyperlipidemia  - Fasting lipid panel pending  - Lipitor held on admission as pt failed swallow eval; resume when able    Type II DM  - A1c was 6.1% in October 2016  - Previously managed with metformin, but on diet-control more recently  - Update A1c: 5.9  Hypokalemia  - Serum potassium is 3.1 on admission, possibly secondary to chlorthalidone which is held - 30 mEq IV potassium given on admission  - Magnesium ok -replete PO  GERD - No EGD report on file  - Pt is currently symptomatic and is being treated with IV Pepcid q12h     DVT prophylaxis:  Lovenox   Code Status: Full Code   Family Communication:   Disposition Plan:     Consultants:   neuro   Subjective: Passed swallow eval-- DYS diet ordered-- hiccups resolved after swallow eval   Objective: Vitals:   10/28/16 2030 10/29/16 0217 10/29/16 0421 10/29/16 0833  BP: (!) 198/88 (!) 203/80 (!) 171/68 (!) 198/78  Pulse: 94 95 81 94  Resp: 20 20 20 20   Temp: 98.7 F (37.1 C) 98.3 F (36.8 C)  98.8 F (37.1 C) 98.5 F (36.9 C)  TempSrc: Oral Oral Axillary Oral  SpO2: 95% 92% 95% 93%  Weight:      Height:        Intake/Output Summary (Last 24 hours) at 10/29/16 1113 Last data filed at 10/29/16 0458  Gross per 24 hour  Intake           209.17 ml  Output              610 ml  Net          -400.83 ml   Filed Weights   10/28/16 0100  Weight: 92 kg (202 lb 12.8 oz)    Examination:  General exam: leaning to right- hiccups Respiratory system: Clear to auscultation. Respiratory effort  normal. Cardiovascular system: S1 & S2 heard, RRR. No JVD, murmurs, rubs, gallops or clicks. No pedal edema. Gastrointestinal system: Abdomen is nondistended, soft and nontender. No organomegaly or masses felt. Normal bowel sounds heard. Central nervous system: Alert and oriented. No focal neurological deficits. 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, s/p right amputation   Data Reviewed: I have personally reviewed following labs and imaging studies  CBC:  Recent Labs Lab 10/27/16 2110 10/27/16 2118  WBC 7.7  --   NEUTROABS 5.0  --   HGB 14.6 13.6  HCT 41.1 40.0  MCV 94.5  --   PLT 238  --    Basic Metabolic Panel:  Recent Labs Lab 10/27/16 2110 10/27/16 2118 10/28/16 0146 10/28/16 1147  NA 137 138  --  138  K 3.1* 3.0*  --  3.1*  CL 101 100*  --  101  CO2 25  --   --  26  GLUCOSE 121* 126*  --  143*  BUN 11 13  --  11  CREATININE 0.94 1.00  --  1.07  CALCIUM 9.4  --   --  9.2  MG  --   --  2.0  --    GFR: Estimated Creatinine Clearance: 66.7 mL/min (by C-G formula based on SCr of 1.07 mg/dL). Liver Function Tests:  Recent Labs Lab 10/27/16 2110  AST 47*  ALT 32  ALKPHOS 27*  BILITOT 0.7  PROT 7.0  ALBUMIN 3.6   No results for input(s): LIPASE, AMYLASE in the last 168 hours. No results for input(s): AMMONIA in the last 168 hours. Coagulation Profile:  Recent Labs Lab 10/27/16 2110  INR 0.99   Cardiac Enzymes: No results for input(s): CKTOTAL, CKMB, CKMBINDEX, TROPONINI in the last 168 hours. BNP (last 3 results) No results for input(s): PROBNP in the last 8760 hours. HbA1C:  Recent Labs  10/28/16 0146  HGBA1C 5.9*   CBG:  Recent Labs Lab 10/28/16 1645 10/28/16 2043 10/29/16 0008 10/29/16 0426 10/29/16 0758  GLUCAP 122* 120* 99 109* 120*   Lipid Profile:  Recent Labs  10/28/16 0146  CHOL 251*  HDL 43  LDLCALC 180*  TRIG 139  CHOLHDL 5.8   Thyroid Function Tests: No results for input(s): TSH, T4TOTAL, FREET4, T3FREE, THYROIDAB in the last 72  hours. Anemia Panel: No results for input(s): VITAMINB12, FOLATE, FERRITIN, TIBC, IRON, RETICCTPCT in the last 72 hours. Urine analysis:    Component Value Date/Time   COLORURINE YELLOW 10/28/2016 [place] 10/28/2016 1744   LABSPEC >1.046 (H) 10/28/2016 1744   PHURINE 6.0 10/28/2016 1744   GLUCOSEU NEGATIVE 10/28/2016 1744   HGBUR NEGATIVE 10/28/2016 1744   BILIRUBINUR NEGATIVE 10/28/2016 1744   KETONESUR 15 (A) 10/28/2016 1744  PROTEINUR 30 (A) 10/28/2016 1744   UROBILINOGEN 0.2 06/01/2013 1752   NITRITE NEGATIVE 10/28/2016 1744   LEUKOCYTESUR NEGATIVE 10/28/2016 1744     )No results found for this or any previous visit (from the past 240 hour(s)).    Anti-infectives    None       Radiology Studies: Ct Angio Head W Or Cook Contrast  Result Date: 10/28/2016 CLINICAL DATA:  75 year old male with right lenticulostriate territory acute infarcts on MRI performed for a left side weakness and fall. Initial encounter. EXAM: CT ANGIOGRAPHY HEAD AND NECK TECHNIQUE: Multidetector CT imaging of the head and neck was performed using the standard protocol during bolus administration of intravenous contrast. Multiplanar CT image reconstructions and MIPs were obtained to evaluate the vascular anatomy. Carotid stenosis measurements (when applicable) are obtained utilizing NASCET criteria, using the distal internal carotid diameter as the denominator. CONTRAST:  50 mL Isovue 370 COMPARISON:  Brain MRI 0956 hours today and head CT 10/27/2016. FINDINGS: CTA NECK Skeleton: Prior median sternotomy. No acute osseous abnormality identified. Visualized paranasal sinuses and mastoids are stable and well pneumatized. Upper chest: Centrilobular emphysema. Mild nonspecific dependent pulmonary opacity. No superior mediastinal lymphadenopathy. Other neck: Negative thyroid, larynx, pharynx, parapharyngeal spaces, retropharyngeal space, sublingual space, submandibular glands and parotid glands. No  cervical lymphadenopathy. Negative orbit and scalp soft tissues. Aortic arch: Sequelae of CABG. Extensive arch calcified atherosclerosis. Three vessel arch configuration. Right carotid system: 7 mm diameter pseudoaneurysm along the proximal (right lateral) aspect of the brachia a cephalic artery as seen on series 8, image 121 and series 5, image 162. No associated stenosis of the proximal brachiocephalic. Soft and calcified plaque of the brachiocephalic artery. No right CCA origin despite soft plaque. Soft and calcified plaque intermittently in the right CCA without stenosis proximal to the right carotid bifurcation. At the bifurcation there is calcified plaque with less than 50 % stenosis with respect to the distal vessel of the proximal right ICA. Calcified plaque in the distal cervical right ICA just below the skullbase also without stenosis. Left carotid system: No left CCA origin stenosis. Intermittent soft and calcified plaque in the left CCA without stenosis proximal to the bifurcation. At the left carotid bifurcation there is bulky in confluent calcified plaque resulting in a Radiographic String Sign Stenosis of the left ICA origin as seen on series 7, image 215 and series 8, image 102. Calcified plaque continues into the left ICA bulb. Negative cervical left ICA otherwise. Vertebral arteries:Soft and calcified plaque in the proximal right subclavian artery without stenosis. Soft plaque at the right vertebral artery origin without hemodynamically significant stenosis (series 7, image 298). Calcified plaque in the right V1 segment, and at the skullbase without associated stenosis. Soft and calcified plaque in the proximal left subclavian artery without hemodynamically significant stenosis. Soft plaque at the left vertebral artery origin without stenosis (series 8, image 146). Intermittent soft and calcified plaque in the left V2 segment with up to mild stenosis. CTA HEAD Posterior circulation: Bilateral  distal vertebral artery calcified plaque. No hemodynamically significant stenosis of the distal right vertebral artery, but there is up severe tandem stenosis of the left vertebral artery V4 segment (series 7, image 151) in the left PICA region and at the vertebrobasilar junction. The basilar remains patent. Both AICA origins appear patent. No basilar stenosis. Mild basilar regularity. Normal SCA and PCA origins. Posterior communicating arteries are diminutive or absent. Mild right PCA irregularity. Severe left PCA P2 segment stenosis best seen on series 10,  image 23 with preserved distal enhancement. Anterior circulation: Both ICA siphons are patent, but there is severe calcified siphon atherosclerosis. Still, there is no definite siphon hemodynamically significant stenosis. Normal ophthalmic artery origins. Patent carotid termini. Patent ACA origins. Mild irregularity and stenosis left ACA A1 segment. Anterior communicating artery and bilateral ACA branches are within normal limits. Left MCA origin and M1 segment are normal. Left MCA bifurcation is patent. Mild irregularity of the left MCA M3 branches. Right MCA origin is patent without stenosis, but there is moderate to severe stenosis in the mid right M1 segment and again at the right MCA bifurcation (series 16, image 26). No right discrete MCA branch occlusion is identified although there are is a paucity of enhancing anterior division right M2 branches (series 12, image 12). Venous sinuses: Patent. Anatomic variants: None. Delayed phase: Mild progression of confluent right basal ganglia hypodensity since the head CT yesterday. No associated hemorrhage or mass effect. Otherwise stable gray-white matter differentiation. No superimposed acute cortically based infarct identified. No abnormal enhancement identified. Review of the MIP images confirms the above findings IMPRESSION: 1. Severe atherosclerosis. Negative for emergent large vessel occlusion. 2.  Pseudoaneurysm of the proximal Brachiocephalic Artery (see series 8, image 121). Recommend Vascular Surgery consultation. 3. 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 No discrete right MCA branch occlusion identified although there is a paucity of enhancing anterior division M2/M3 branches. 4. Extensive Right carotid atherosclerosis without hemodynamically significant stenosis. 5. High-grade RADIOGRAPHIC STRING SIGN STENOSIS of the Left ICA origin due to bulky calcified plaque. 6. 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 7. Severe stenosis Left PCA P2 segment. 8. Expected evolution on CT of the acute right basal ganglia infarcts. No associated hemorrhage or mass effect. 9. Emphysema. Electronically Signed   By: Genevie Ann M.D.   On: 10/28/2016 13:00   Dg Chest 1 View  Result Date: 10/28/2016 CLINICAL DATA:  CVA with left-sided weakness. The patient ports nausea and vomiting. History of diabetes, hypertension, previous CP 80. EXAM: CHEST 1 VIEW COMPARISON:  Chest x-ray of October 12, 2014 FINDINGS: The lungs are less well inflated today. There is patchy density in the right perihilar region. There is no alveolar infiltrate. There is no pleural effusion. The cardiac silhouette is enlarged. There is some crowding of the central pulmonary vascularity. The patient has undergone previous CABG. There is calcification in the wall of the thoracic aorta. The observed bony structures exhibit no acute abnormalities. IMPRESSION: Mild hypo inflation accentuates the lung markings. However, there is likely low-grade CHF with mild interstitial edema. Mildly increased density in the right perihilar region may reflect subsegmental atelectasis. When the patient can tolerate the procedure, a PA and lateral chest x-ray with better inspiratory effort would be useful. Thoracic aortic atherosclerosis. Electronically Signed   By: David  Martinique M.D.    On: 10/28/2016 11:46   Ct Head Cook Contrast  Result Date: 10/27/2016 CLINICAL DATA:  Left-sided weakness.  Multiple recent falls. EXAM: CT HEAD WITHOUT CONTRAST TECHNIQUE: Contiguous axial images were obtained from the base of the skull through the vertex without intravenous contrast. COMPARISON:  01/25/2016 FINDINGS: Brain: Negative for acute intracranial hemorrhage or extra-axial fluid collection. There is focal hypodensity in the right caudate extending into the right lentiform nuclei, likely subacute infarction. No significant mass effect. Unchanged ventriculomegaly. Remote left corona radiata infarction, unchanged. Probable remote cerebellar infarctions, unchanged. Vascular: No hyperdense vessel or unexpected  calcification. Skull: Normal. Negative for fracture or focal lesion. Sinuses/Orbits: No acute finding. IMPRESSION: 1. New focal hypodensity measuring approximately 2.5 cm in the right caudate, extending into the right lentiform nuclei, likely subacute infarction. No mass effect or midline shift. Recommend brain MRI to characterize. 2. Unchanged ventriculomegaly and remote infarctions involving the left corona radiata and both cerebellar hemispheres. Electronically Signed   By: Andreas Newport M.D.   On: 10/27/2016 23:17   Ct Angio Neck W Or Cook Contrast  Result Date: 10/28/2016 CLINICAL DATA:  75 year old male with right lenticulostriate territory acute infarcts on MRI performed for a left side weakness and fall. Initial encounter. EXAM: CT ANGIOGRAPHY HEAD AND NECK TECHNIQUE: Multidetector CT imaging of the head and neck was performed using the standard protocol during bolus administration of intravenous contrast. Multiplanar CT image reconstructions and MIPs were obtained to evaluate the vascular anatomy. Carotid stenosis measurements (when applicable) are obtained utilizing NASCET criteria, using the distal internal carotid diameter as the denominator. CONTRAST:  50 mL Isovue 370 COMPARISON:   Brain MRI 0956 hours today and head CT 10/27/2016. FINDINGS: CTA NECK Skeleton: Prior median sternotomy. No acute osseous abnormality identified. Visualized paranasal sinuses and mastoids are stable and well pneumatized. Upper chest: Centrilobular emphysema. Mild nonspecific dependent pulmonary opacity. No superior mediastinal lymphadenopathy. Other neck: Negative thyroid, larynx, pharynx, parapharyngeal spaces, retropharyngeal space, sublingual space, submandibular glands and parotid glands. No cervical lymphadenopathy. Negative orbit and scalp soft tissues. Aortic arch: Sequelae of CABG. Extensive arch calcified atherosclerosis. Three vessel arch configuration. Right carotid system: 7 mm diameter pseudoaneurysm along the proximal (right lateral) aspect of the brachia a cephalic artery as seen on series 8, image 121 and series 5, image 162. No associated stenosis of the proximal brachiocephalic. Soft and calcified plaque of the brachiocephalic artery. No right CCA origin despite soft plaque. Soft and calcified plaque intermittently in the right CCA without stenosis proximal to the right carotid bifurcation. At the bifurcation there is calcified plaque with less than 50 % stenosis with respect to the distal vessel of the proximal right ICA. Calcified plaque in the distal cervical right ICA just below the skullbase also without stenosis. Left carotid system: No left CCA origin stenosis. Intermittent soft and calcified plaque in the left CCA without stenosis proximal to the bifurcation. At the left carotid bifurcation there is bulky in confluent calcified plaque resulting in a Radiographic String Sign Stenosis of the left ICA origin as seen on series 7, image 215 and series 8, image 102. Calcified plaque continues into the left ICA bulb. Negative cervical left ICA otherwise. Vertebral arteries:Soft and calcified plaque in the proximal right subclavian artery without stenosis. Soft plaque at the right vertebral artery  origin without hemodynamically significant stenosis (series 7, image 298). Calcified plaque in the right V1 segment, and at the skullbase without associated stenosis. Soft and calcified plaque in the proximal left subclavian artery without hemodynamically significant stenosis. Soft plaque at the left vertebral artery origin without stenosis (series 8, image 146). Intermittent soft and calcified plaque in the left V2 segment with up to mild stenosis. CTA HEAD Posterior circulation: Bilateral distal vertebral artery calcified plaque. No hemodynamically significant stenosis of the distal right vertebral artery, but there is up severe tandem stenosis of the left vertebral artery V4 segment (series 7, image 151) in the left PICA region and at the vertebrobasilar junction. The basilar remains patent. Both AICA origins appear patent. No basilar stenosis. Mild basilar regularity. Normal SCA and PCA origins. Posterior communicating  arteries are diminutive or absent. Mild right PCA irregularity. Severe left PCA P2 segment stenosis best seen on series 10, image 23 with preserved distal enhancement. Anterior circulation: Both ICA siphons are patent, but there is severe calcified siphon atherosclerosis. Still, there is no definite siphon hemodynamically significant stenosis. Normal ophthalmic artery origins. Patent carotid termini. Patent ACA origins. Mild irregularity and stenosis left ACA A1 segment. Anterior communicating artery and bilateral ACA branches are within normal limits. Left MCA origin and M1 segment are normal. Left MCA bifurcation is patent. Mild irregularity of the left MCA M3 branches. Right MCA origin is patent without stenosis, but there is moderate to severe stenosis in the mid right M1 segment and again at the right MCA bifurcation (series 16, image 26). No right discrete MCA branch occlusion is identified although there are is a paucity of enhancing anterior division right M2 branches (series 12, image  12). Venous sinuses: Patent. Anatomic variants: None. Delayed phase: Mild progression of confluent right basal ganglia hypodensity since the head CT yesterday. No associated hemorrhage or mass effect. Otherwise stable gray-white matter differentiation. No superimposed acute cortically based infarct identified. No abnormal enhancement identified. Review of the MIP images confirms the above findings IMPRESSION: 1. Severe atherosclerosis. Negative for emergent large vessel occlusion. 2. Pseudoaneurysm of the proximal Brachiocephalic Artery (see series 8, image 121). Recommend Vascular Surgery consultation. 3. 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 No discrete right MCA branch occlusion identified although there is a paucity of enhancing anterior division M2/M3 branches. 4. Extensive Right carotid atherosclerosis without hemodynamically significant stenosis. 5. High-grade RADIOGRAPHIC STRING SIGN STENOSIS of the Left ICA origin due to bulky calcified plaque. 6. 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 7. Severe stenosis Left PCA P2 segment. 8. Expected evolution on CT of the acute right basal ganglia infarcts. No associated hemorrhage or mass effect. 9. Emphysema. Electronically Signed   By: Genevie Ann M.D.   On: 10/28/2016 13:00   [name] Cook Contrast  Result Date: 10/28/2016 CLINICAL DATA:  Left-sided weakness.  Fall at home. EXAM: MRI HEAD WITHOUT CONTRAST TECHNIQUE: Multiplanar, multiecho pulse sequences of the brain and surrounding structures were obtained without intravenous contrast. COMPARISON:  Head CT from yesterday FINDINGS: Brain: Restricted diffusion in a wedge-shaped area extending from the right anterior putamen through the anterior limb internal capsule to the caudate head. Distinct second area of acute infarct or posteriorly with the same morphology, extending from putamen to caudate body. No acute hemorrhage.  Chronic microvascular disease with moderate ischemic gliosis throughout cerebral white matter. Small remote left frontal infarct at the vertex. There is lateral and third ventriculomegaly that is likely from atrophy. No temporal horn dilatation typical of an obstructive for communicating hydrocephalus. Vascular: Preserved flow voids.  No focal marrow lesion. Skull and upper cervical spine: Negative Sinuses/Orbits: Negative IMPRESSION: 1. Two acute infarcts in the right lateral lenticulostriate distribution. 2. Chronic microvascular disease and small remote left frontal infarct. 3. Lateral and third ventriculomegaly, favor atrophy over communicating hydrocephalus. Electronically Signed   By: Monte Fantasia M.D.   On: 10/28/2016 11:24   Dg Swallowing Func-speech Pathology  Result Date: 10/29/2016 Objective Swallowing Evaluation: Type of Study: MBS-Modified Barium Swallow Study Patient Details Name: Marshun Fulmore MRN: [id number] Date of Birth: [birth year] Today's Date: 10/29/2016 Time: SLP Start Time (ACUTE ONLY): 0911-SLP Stop Time (ACUTE ONLY): 0931 SLP Time Calculation (min) (ACUTE ONLY): 20 min Past Medical  History: Past Medical History: Diagnosis Date . Arthritis  . CAD (coronary artery disease)   2014 75% left main stenosis, LAD 80% followed by a mid subtotal stenosis, circumflex 90% stenosis, right coronary artery occluded, SVG to RCA occluded, SVG ramus occluded, SVG to OM patent without a sequential anastomosis noted. Graft had a focal 99% stenosis which was stented. 3.5 x 16 mm premier. b. cath 10/13/2014 DES to SVG to OM overlap previous stent . Cholelithiasis 2014 . Cogan syndrome  . Colon polyps  . Diabetes mellitus without complication ([place])  . Dyslipidemia  . Gallstones  . GERD (gastroesophageal reflux disease)  . Hepatic hemangioma 2014 . 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 . Hypertension  . Kidney stones  . Myocardial infarction 1999 . Necrotizing fasciitis ([place])  . Peptic ulcer  disease  . Peripheral vascular disease [facility])  Past Surgical History: Past Surgical History: Procedure Laterality Date . APPENDECTOMY  2007 . CORONARY ARTERY BYPASS GRAFT  1999  LIMA to the LAD, saphenous vein graft to obtuse marginal 1 and obtuse marginal 2, spahenous vein graft to intermediate, spahenous vein graft to the right coronary artery in 1999 . LEFT HEART CATHETERIZATION WITH CORONARY ANGIOGRAM N/A 10/13/2014  Procedure: LEFT HEART CATHETERIZATION WITH CORONARY ANGIOGRAM;  Surgeon: Peter M Martinique, MD;  Location: [facility] CATH LAB;  Service: Cardiovascular;  Laterality: N/A; . LEFT HEART CATHETERIZATION WITH CORONARY/GRAFT ANGIOGRAM N/A 07/19/2013  Procedure: LEFT HEART CATHETERIZATION WITH Beatrix Fetters;  Surgeon: Peter M Martinique, MD;  Location: [facility] CATH LAB;  Service: Cardiovascular;  Laterality: N/A; . LEG AMPUTATION  1999  Right BKA . PERCUTANEOUS CORONARY STENT INTERVENTION (PCI-S)  07/19/2013  Procedure: PERCUTANEOUS CORONARY STENT INTERVENTION (PCI-S);  Surgeon: Peter M Martinique, MD;  Location: [facility] CATH LAB;  Service: Cardiovascular;; HPI: [name] Summerlinis a 75 y.o.malewith medical history significant for coronary artery disease status post CABG, type 2 diabetes mellitus, hypertension, hyperlipidemia, and GERD who presents to the ED with weakness involving the left face, left arm, and left leg resulting in a fall. 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 CXR mild hypo inflation accentuates the lung markings, likely low-grade CHF with mild interstitial edema. Mildly increased density in the right perihilar region may reflect subsegmental atelectasis. No Data Recorded Assessment / Plan / Recommendation CHL IP CLINICAL IMPRESSIONS 10/29/2016 Therapy Diagnosis -- Clinical Impression Moderate oral phase dysphagia marked  by decreased bolus cohesion with left side spill and facial residue and delayed oral transit. Mild pharyngeal phase deficits are sensory based with swallow iniatiating at the level of the valleculae and pyriform sinuses. Pilar Plate and silent laryngeal penetration of nectar before and during swallow not consistently prevented with multiple trials chin tuck posture. Verbal cues to cough/throat clear intermittently cleared penetrates. Mild vallecular residue. Recommend Dys 2 texture, honey thick liquids, swallow twice, pills whole in applesauce, check oral cavity for pocketed food. ST will continue intervention.    Impact on safety and function Moderate aspiration risk   CHL IP TREATMENT RECOMMENDATION 10/29/2016 Treatment Recommendations Therapy as outlined in treatment plan below   Prognosis 10/29/2016 Prognosis for Safe Diet Advancement Good Barriers to Reach Goals -- Barriers/Prognosis Comment -- CHL IP DIET RECOMMENDATION 10/29/2016 SLP Diet Recommendations Dysphagia 2 (Fine chop) solids;Honey thick liquids Liquid Administration via Cup Medication Administration Whole meds with puree Compensations Slow rate;Small sips/bites;Minimize environmental distractions;Lingual sweep for clearance of pocketing;Multiple dry  swallows after each bite/sip Postural Changes Remain semi-upright after after feeds/meals (Comment);Seated upright at 90 degrees   CHL IP OTHER RECOMMENDATIONS 10/29/2016 Recommended Consults -- Oral Care Recommendations Oral care BID Other Recommendations Order thickener from pharmacy   CHL IP FOLLOW UP RECOMMENDATIONS 10/29/2016 Follow up Recommendations Inpatient Rehab   CHL IP FREQUENCY AND DURATION 10/29/2016 Speech Therapy Frequency (ACUTE ONLY) min 2x/week Treatment Duration 2 weeks      CHL IP ORAL PHASE 10/29/2016 Oral Phase Impaired Oral - Pudding Teaspoon -- Oral - Pudding Cup -- Oral - Honey Teaspoon -- Oral - Honey Cup Left anterior bolus loss Oral - Nectar Teaspoon -- Oral - Nectar Cup Left anterior  bolus loss Oral - Nectar Straw -- Oral - Thin Teaspoon -- Oral - Thin Cup -- Oral - Thin Straw -- Oral - Puree -- Oral - Mech Soft -- Oral - Regular Delayed oral transit Oral - Multi-Consistency -- Oral - Pill -- Oral Phase - Comment --  CHL IP PHARYNGEAL PHASE 10/29/2016 Pharyngeal Phase Impaired Pharyngeal- Pudding Teaspoon -- Pharyngeal -- Pharyngeal- Pudding Cup -- Pharyngeal -- Pharyngeal- Honey Teaspoon -- Pharyngeal -- Pharyngeal- Honey Cup Delayed swallow initiation-vallecula;Pharyngeal residue - valleculae;Reduced tongue base retraction;Delayed swallow initiation-pyriform sinuses Pharyngeal -- Pharyngeal- Nectar Teaspoon -- Pharyngeal -- Pharyngeal- Nectar Cup Delayed swallow initiation-vallecula;Delayed swallow initiation-pyriform sinuses;Penetration/Aspiration during swallow;Pharyngeal residue - valleculae;Reduced tongue base retraction;Compensatory strategies attempted (with notebox) Pharyngeal Material enters airway, remains ABOVE vocal cords and not ejected out Pharyngeal- Nectar Straw -- Pharyngeal -- Pharyngeal- Thin Teaspoon -- Pharyngeal -- Pharyngeal- Thin Cup -- Pharyngeal -- Pharyngeal- Thin Straw -- Pharyngeal -- Pharyngeal- Puree NT Pharyngeal -- Pharyngeal- Mechanical Soft NT Pharyngeal -- Pharyngeal- Regular Pharyngeal residue - valleculae;Reduced tongue base retraction Pharyngeal Material does not enter airway Pharyngeal- Multi-consistency NT Pharyngeal -- Pharyngeal- Pill NT Pharyngeal -- Pharyngeal Comment --  CHL IP CERVICAL ESOPHAGEAL PHASE 10/29/2016 Cervical Esophageal Phase (No Data) Pudding Teaspoon -- Pudding Cup -- Honey Teaspoon -- Honey Cup -- Nectar Teaspoon -- Nectar Cup -- Nectar Straw -- Thin Teaspoon -- Thin Cup -- Thin Straw -- Puree -- Mechanical Soft -- Regular -- Multi-consistency -- Pill -- Cervical Esophageal Comment -- No flowsheet data found. [place] Siren 10/29/2016, 10:26 AM [name] Cook.Ed CCC-SLP Pager [phone]                   Scheduled  Meds: .  stroke: mapping our early stages of recovery book   Does not apply Once  . aspirin  300 mg Rectal Daily   Or  . aspirin  325 mg Oral Daily  . atorvastatin  80 mg Oral q1800  . calcium-vitamin D  1 tablet Oral Daily  . chlorhexidine  15 mL Mouth Rinse BID  . clopidogrel  75 mg Oral Daily  . enoxaparin (LOVENOX) injection  40 mg Subcutaneous Q24H  . famotidine (PEPCID) IV  20 mg Intravenous Q12H  . finasteride  5 mg Oral Daily  . insulin aspart  0-9 Units Subcutaneous Q4H  . mouth rinse  15 mL Mouth Rinse q12n4p  . mouth rinse  15 mL Mouth Rinse BID  . multivitamin with minerals  1 tablet Oral Daily  . potassium chloride  40 mEq Oral Once  . ranitidine (ZANTAC) syrup 15 mg/mL  150 mg Oral Once  . tamsulosin  0.8 mg Oral Daily   Continuous Infusions:    LOS: 1 day    Time spent: 35 min    [place], DO [facility]  Pager [phone]  If 7PM-7AM, please contact night-coverage [email or web address] Password [email or web address] 10/29/2016, 11:13 AM

## 2016-10-30 ENCOUNTER — Inpatient Hospital Stay (HOSPITAL_COMMUNITY)
Admission: RE | Admit: 2016-10-30 | Discharge: 2016-11-21 | DRG: 057 | Disposition: A | Discharge status: Skilled Nursing Facility | Payer: Medicare Other | Source: Intra-hospital | Attending: Physical Medicine & Rehabilitation | Admitting: Physical Medicine & Rehabilitation

## 2016-10-30 DIAGNOSIS — I69322 Dysarthria following cerebral infarction: Secondary | ICD-10-CM

## 2016-10-30 DIAGNOSIS — R279 Unspecified lack of coordination: Secondary | ICD-10-CM | POA: Diagnosis not present

## 2016-10-30 DIAGNOSIS — Z79899 Other long term (current) drug therapy: Secondary | ICD-10-CM | POA: Diagnosis not present

## 2016-10-30 DIAGNOSIS — E1169 Type 2 diabetes mellitus with other specified complication: Secondary | ICD-10-CM | POA: Diagnosis not present

## 2016-10-30 DIAGNOSIS — I10 Essential (primary) hypertension: Secondary | ICD-10-CM | POA: Diagnosis present

## 2016-10-30 DIAGNOSIS — Z89511 Acquired absence of right leg below knee: Secondary | ICD-10-CM | POA: Diagnosis not present

## 2016-10-30 DIAGNOSIS — R1311 Dysphagia, oral phase: Secondary | ICD-10-CM | POA: Diagnosis present

## 2016-10-30 DIAGNOSIS — E876 Hypokalemia: Secondary | ICD-10-CM | POA: Diagnosis present

## 2016-10-30 DIAGNOSIS — Z87891 Personal history of nicotine dependence: Secondary | ICD-10-CM

## 2016-10-30 DIAGNOSIS — E1151 Type 2 diabetes mellitus with diabetic peripheral angiopathy without gangrene: Secondary | ICD-10-CM | POA: Diagnosis present

## 2016-10-30 DIAGNOSIS — R066 Hiccough: Secondary | ICD-10-CM | POA: Diagnosis present

## 2016-10-30 DIAGNOSIS — Z8711 Personal history of peptic ulcer disease: Secondary | ICD-10-CM

## 2016-10-30 DIAGNOSIS — I160 Hypertensive urgency: Secondary | ICD-10-CM | POA: Diagnosis not present

## 2016-10-30 DIAGNOSIS — N179 Acute kidney failure, unspecified: Secondary | ICD-10-CM | POA: Diagnosis present

## 2016-10-30 DIAGNOSIS — D62 Acute posthemorrhagic anemia: Secondary | ICD-10-CM | POA: Diagnosis present

## 2016-10-30 DIAGNOSIS — R7303 Prediabetes: Secondary | ICD-10-CM | POA: Diagnosis not present

## 2016-10-30 DIAGNOSIS — R1310 Dysphagia, unspecified: Secondary | ICD-10-CM

## 2016-10-30 DIAGNOSIS — G8104 Flaccid hemiplegia affecting left nondominant side: Secondary | ICD-10-CM

## 2016-10-30 DIAGNOSIS — I639 Cerebral infarction, unspecified: Secondary | ICD-10-CM

## 2016-10-30 DIAGNOSIS — R471 Dysarthria and anarthria: Secondary | ICD-10-CM | POA: Diagnosis present

## 2016-10-30 DIAGNOSIS — I69328 Other speech and language deficits following cerebral infarction: Secondary | ICD-10-CM | POA: Diagnosis not present

## 2016-10-30 DIAGNOSIS — I6529 Occlusion and stenosis of unspecified carotid artery: Secondary | ICD-10-CM | POA: Diagnosis not present

## 2016-10-30 DIAGNOSIS — G8102 Flaccid hemiplegia affecting left dominant side: Secondary | ICD-10-CM | POA: Diagnosis not present

## 2016-10-30 DIAGNOSIS — I252 Old myocardial infarction: Secondary | ICD-10-CM

## 2016-10-30 DIAGNOSIS — Z951 Presence of aortocoronary bypass graft: Secondary | ICD-10-CM

## 2016-10-30 DIAGNOSIS — K5901 Slow transit constipation: Secondary | ICD-10-CM | POA: Diagnosis present

## 2016-10-30 DIAGNOSIS — I69352 Hemiplegia and hemiparesis following cerebral infarction affecting left dominant side: Principal | ICD-10-CM

## 2016-10-30 DIAGNOSIS — R414 Neurologic neglect syndrome: Secondary | ICD-10-CM | POA: Diagnosis present

## 2016-10-30 DIAGNOSIS — I63231 Cerebral infarction due to unspecified occlusion or stenosis of right carotid arteries: Secondary | ICD-10-CM

## 2016-10-30 DIAGNOSIS — I2510 Atherosclerotic heart disease of native coronary artery without angina pectoris: Secondary | ICD-10-CM | POA: Diagnosis present

## 2016-10-30 DIAGNOSIS — R531 Weakness: Secondary | ICD-10-CM | POA: Diagnosis not present

## 2016-10-30 DIAGNOSIS — Z5189 Encounter for other specified aftercare: Secondary | ICD-10-CM | POA: Diagnosis not present

## 2016-10-30 DIAGNOSIS — K219 Gastro-esophageal reflux disease without esophagitis: Secondary | ICD-10-CM | POA: Diagnosis present

## 2016-10-30 DIAGNOSIS — I69252 Hemiplegia and hemiparesis following other nontraumatic intracranial hemorrhage affecting left dominant side: Secondary | ICD-10-CM | POA: Diagnosis not present

## 2016-10-30 DIAGNOSIS — I69354 Hemiplegia and hemiparesis following cerebral infarction affecting left non-dominant side: Secondary | ICD-10-CM | POA: Diagnosis not present

## 2016-10-30 DIAGNOSIS — Z993 Dependence on wheelchair: Secondary | ICD-10-CM

## 2016-10-30 DIAGNOSIS — I69291 Dysphagia following other nontraumatic intracranial hemorrhage: Secondary | ICD-10-CM | POA: Diagnosis not present

## 2016-10-30 DIAGNOSIS — Z7982 Long term (current) use of aspirin: Secondary | ICD-10-CM | POA: Diagnosis not present

## 2016-10-30 DIAGNOSIS — D649 Anemia, unspecified: Secondary | ICD-10-CM | POA: Diagnosis not present

## 2016-10-30 DIAGNOSIS — E870 Hyperosmolality and hypernatremia: Secondary | ICD-10-CM | POA: Diagnosis present

## 2016-10-30 DIAGNOSIS — R29706 NIHSS score 6: Secondary | ICD-10-CM | POA: Diagnosis present

## 2016-10-30 DIAGNOSIS — M6281 Muscle weakness (generalized): Secondary | ICD-10-CM | POA: Diagnosis not present

## 2016-10-30 DIAGNOSIS — I69319 Unspecified symptoms and signs involving cognitive functions following cerebral infarction: Secondary | ICD-10-CM

## 2016-10-30 DIAGNOSIS — E785 Hyperlipidemia, unspecified: Secondary | ICD-10-CM | POA: Diagnosis present

## 2016-10-30 DIAGNOSIS — IMO0002 Reserved for concepts with insufficient information to code with codable children: Secondary | ICD-10-CM

## 2016-10-30 DIAGNOSIS — G464 Cerebellar stroke syndrome: Secondary | ICD-10-CM | POA: Diagnosis not present

## 2016-10-30 DIAGNOSIS — R4189 Other symptoms and signs involving cognitive functions and awareness: Secondary | ICD-10-CM | POA: Diagnosis present

## 2016-10-30 DIAGNOSIS — G8194 Hemiplegia, unspecified affecting left nondominant side: Secondary | ICD-10-CM | POA: Diagnosis not present

## 2016-10-30 DIAGNOSIS — R0989 Other specified symptoms and signs involving the circulatory and respiratory systems: Secondary | ICD-10-CM | POA: Diagnosis not present

## 2016-10-30 LAB — BASIC METABOLIC PANEL
ANION GAP: 8 (ref 5–15)
BUN: 12 mg/dL (ref 6–20)
CALCIUM: 8.8 mg/dL — AB (ref 8.9–10.3)
CO2: 29 mmol/L (ref 22–32)
Chloride: 104 mmol/L (ref 101–111)
Creatinine, Ser: 0.96 mg/dL (ref 0.61–1.24)
GLUCOSE: 127 mg/dL — AB (ref 65–99)
Potassium: 3.5 mmol/L (ref 3.5–5.1)
SODIUM: 141 mmol/L (ref 135–145)

## 2016-10-30 LAB — GLUCOSE, CAPILLARY
GLUCOSE-CAPILLARY: 132 mg/dL — AB (ref 65–99)
Glucose-Capillary: 137 mg/dL — ABNORMAL HIGH (ref 65–99)
Glucose-Capillary: 125 mg/dL — ABNORMAL HIGH (ref 65–99)

## 2016-10-30 MED ORDER — CLOPIDOGREL BISULFATE 75 MG PO TABS
75.0000 mg | ORAL_TABLET | Freq: Every day | ORAL | Status: DC
  Administered 2016-10-31 – 2016-11-21 (×22): 75 mg via ORAL
  Filled 2016-10-30 (×22): qty 1

## 2016-10-30 MED ORDER — ASPIRIN 325 MG PO TABS
325.0000 mg | ORAL_TABLET | Freq: Every day | ORAL | Status: DC
  Administered 2016-10-31 – 2016-11-21 (×22): 325 mg via ORAL
  Filled 2016-10-30 (×24): qty 1

## 2016-10-30 MED ORDER — ACETAMINOPHEN 325 MG PO TABS
325.0000 mg | ORAL_TABLET | ORAL | Status: DC | PRN
  Administered 2016-11-10 – 2016-11-19 (×3): 650 mg via ORAL
  Filled 2016-10-30 (×3): qty 2

## 2016-10-30 MED ORDER — ADULT MULTIVITAMIN W/MINERALS CH
1.0000 | ORAL_TABLET | Freq: Every day | ORAL | Status: DC
  Administered 2016-10-31 – 2016-11-21 (×22): 1 via ORAL
  Filled 2016-10-30 (×22): qty 1

## 2016-10-30 MED ORDER — FINASTERIDE 5 MG PO TABS
5.0000 mg | ORAL_TABLET | Freq: Every day | ORAL | Status: DC
  Administered 2016-10-31 – 2016-11-21 (×21): 5 mg via ORAL
  Filled 2016-10-30 (×22): qty 1

## 2016-10-30 MED ORDER — PROCHLORPERAZINE MALEATE 5 MG PO TABS: 5.0000 mg | ORAL_TABLET | Freq: Four times a day (QID) | ORAL | Status: DC | PRN

## 2016-10-30 MED ORDER — HALOPERIDOL 1 MG PO TABS: 1.0000 mg | ORAL_TABLET | Freq: Four times a day (QID) | ORAL | Status: DC | PRN

## 2016-10-30 MED ORDER — CALCIUM CARBONATE-VITAMIN D 500-200 MG-UNIT PO TABS
1.0000 | ORAL_TABLET | Freq: Every day | ORAL | Status: DC
  Administered 2016-10-31 – 2016-11-21 (×22): 1 via ORAL
  Filled 2016-10-30 (×22): qty 1

## 2016-10-30 MED ORDER — FAMOTIDINE 20 MG PO TABS: 20.0000 mg | ORAL_TABLET | Freq: Two times a day (BID) | ORAL | Status: DC

## 2016-10-30 MED ORDER — DIPHENHYDRAMINE HCL 25 MG PO CAPS: 25.0000 mg | ORAL_CAPSULE | Freq: Every evening | ORAL | Status: DC | PRN

## 2016-10-30 MED ORDER — FLEET ENEMA 7-19 GM/118ML RE ENEM: 1.0000 | ENEMA | Freq: Once | RECTAL | Status: DC | PRN

## 2016-10-30 MED ORDER — BISACODYL 10 MG RE SUPP
10.0000 mg | Freq: Every day | RECTAL | Status: DC | PRN
  Administered 2016-11-03: 10 mg via RECTAL
  Filled 2016-10-30 (×2): qty 1

## 2016-10-30 MED ORDER — CLOPIDOGREL BISULFATE 75 MG PO TABS: 75.0000 mg | ORAL_TABLET | Freq: Every day | ORAL | Status: AC

## 2016-10-30 MED ORDER — DIPHENHYDRAMINE HCL 12.5 MG/5ML PO ELIX: 12.5000 mg | ORAL_SOLUTION | Freq: Four times a day (QID) | ORAL | Status: DC | PRN

## 2016-10-30 MED ORDER — ATORVASTATIN CALCIUM 80 MG PO TABS: 80.0000 mg | ORAL_TABLET | Freq: Every day | ORAL | Status: AC

## 2016-10-30 MED ORDER — FAMOTIDINE 20 MG PO TABS
20.0000 mg | ORAL_TABLET | Freq: Two times a day (BID) | ORAL | Status: DC
  Administered 2016-10-30 – 2016-11-21 (×44): 20 mg via ORAL
  Filled 2016-10-30 (×44): qty 1

## 2016-10-30 MED ORDER — ASPIRIN 325 MG PO TABS: 325.0000 mg | ORAL_TABLET | Freq: Every day | ORAL | Status: DC

## 2016-10-30 MED ORDER — TRAZODONE HCL 50 MG PO TABS
25.0000 mg | ORAL_TABLET | Freq: Every evening | ORAL | Status: DC | PRN
  Administered 2016-10-31: 50 mg via ORAL
  Administered 2016-11-13: 25 mg via ORAL
  Filled 2016-10-30: qty 1

## 2016-10-30 MED ORDER — HALOPERIDOL 1 MG PO TABS
1.0000 mg | ORAL_TABLET | Freq: Four times a day (QID) | ORAL | Status: DC | PRN
  Administered 2016-10-30 (×2): 1 mg via ORAL
  Filled 2016-10-30 (×2): qty 1

## 2016-10-30 MED ORDER — ASPIRIN 300 MG RE SUPP
300.0000 mg | Freq: Every day | RECTAL | Status: DC
  Filled 2016-10-30 (×4): qty 1

## 2016-10-30 MED ORDER — GUAIFENESIN-DM 100-10 MG/5ML PO SYRP: 5.0000 mL | ORAL_SOLUTION | Freq: Four times a day (QID) | ORAL | Status: DC | PRN

## 2016-10-30 MED ORDER — PROCHLORPERAZINE 25 MG RE SUPP: 12.5000 mg | Freq: Four times a day (QID) | RECTAL | Status: DC | PRN

## 2016-10-30 MED ORDER — SENNOSIDES-DOCUSATE SODIUM 8.6-50 MG PO TABS
1.0000 | ORAL_TABLET | Freq: Every evening | ORAL | Status: DC | PRN
  Filled 2016-10-30: qty 1

## 2016-10-30 MED ORDER — BACLOFEN 5 MG HALF TABLET
5.0000 mg | ORAL_TABLET | Freq: Three times a day (TID) | ORAL | Status: DC
  Administered 2016-10-30 – 2016-10-31 (×2): 5 mg via ORAL
  Filled 2016-10-30 (×2): qty 1

## 2016-10-30 MED ORDER — CHLORHEXIDINE GLUCONATE 0.12 % MT SOLN
15.0000 mL | Freq: Two times a day (BID) | OROMUCOSAL | Status: DC
  Administered 2016-10-30 – 2016-11-21 (×43): 15 mL via OROMUCOSAL
  Filled 2016-10-30 (×43): qty 15

## 2016-10-30 MED ORDER — INSULIN ASPART 100 UNIT/ML ~~LOC~~ SOLN: 0.0000 [IU] | Freq: Three times a day (TID) | SUBCUTANEOUS | 11 refills | Status: DC

## 2016-10-30 MED ORDER — PROCHLORPERAZINE EDISYLATE 5 MG/ML IJ SOLN: 5.0000 mg | Freq: Four times a day (QID) | INTRAMUSCULAR | Status: DC | PRN

## 2016-10-30 MED ORDER — ENOXAPARIN SODIUM 40 MG/0.4ML ~~LOC~~ SOLN
40.0000 mg | SUBCUTANEOUS | Status: DC
  Administered 2016-10-31 – 2016-11-20 (×21): 40 mg via SUBCUTANEOUS
  Filled 2016-10-30 (×21): qty 0.4

## 2016-10-30 MED ORDER — ATORVASTATIN CALCIUM 80 MG PO TABS
80.0000 mg | ORAL_TABLET | Freq: Every day | ORAL | Status: DC
  Administered 2016-10-31 – 2016-11-20 (×21): 80 mg via ORAL
  Filled 2016-10-30 (×20): qty 1

## 2016-10-30 MED ORDER — ENOXAPARIN SODIUM 40 MG/0.4ML ~~LOC~~ SOLN: 40.0000 mg | SUBCUTANEOUS | Status: DC

## 2016-10-30 MED ORDER — STARCH (THICKENING) PO POWD: ORAL | 0 refills | Status: AC

## 2016-10-30 MED ORDER — ALUM & MAG HYDROXIDE-SIMETH 200-200-20 MG/5ML PO SUSP: 30.0000 mL | ORAL | Status: DC | PRN

## 2016-10-30 MED ORDER — STARCH (THICKENING) PO POWD
ORAL | Status: DC | PRN
Start: 2016-10-30 — End: 2016-11-21
  Filled 2016-10-30: qty 227

## 2016-10-30 MED ORDER — INSULIN ASPART 100 UNIT/ML ~~LOC~~ SOLN
0.0000 [IU] | Freq: Three times a day (TID) | SUBCUTANEOUS | Status: DC
  Administered 2016-10-31: 2 [IU] via SUBCUTANEOUS
  Administered 2016-10-31 (×2): 1 [IU] via SUBCUTANEOUS
  Administered 2016-11-01: 2 [IU] via SUBCUTANEOUS
  Administered 2016-11-01 – 2016-11-06 (×12): 1 [IU] via SUBCUTANEOUS
  Administered 2016-11-07: 2 [IU] via SUBCUTANEOUS
  Administered 2016-11-07 (×2): 1 [IU] via SUBCUTANEOUS
  Administered 2016-11-08: 2 [IU] via SUBCUTANEOUS
  Administered 2016-11-08 – 2016-11-14 (×16): 1 [IU] via SUBCUTANEOUS

## 2016-10-30 MED ORDER — TAMSULOSIN HCL 0.4 MG PO CAPS
0.8000 mg | ORAL_CAPSULE | Freq: Every day | ORAL | Status: DC
  Administered 2016-10-31 – 2016-11-21 (×22): 0.8 mg via ORAL
  Filled 2016-10-30 (×22): qty 2

## 2016-10-30 NOTE — Progress Notes (Signed)
Physical Therapy Treatment Patient Details Name: [name] Cook MRN: [id number] DOB: 1941-08-20 Today's Date: 10/30/2016    History of Present Illness 75 y.o.malewith medical history significant for coronary artery disease status post CABG and subsequent graft stent, type 2 diabetes mellitus, hypertension, hyperlipidemia, and GERD who presents to the ED with weakness involving the left face, left arm, and left leg. he fell at home. MRI brain +Two acute infarcts in the right lateral lenticulostriate    PT Comments    Patient motivated and demonstrated improved ability to bring his torso to midline in sitting (could adjust with minguard if only slightly off center; if distracted could lose balance posteriorly with no awareness until questioned re: balance and then required up to max assist to regain). Use of mirror for pt to align in midline in sitting was helpful cue for this pt.   Follow Up Recommendations  CIR     Equipment Recommendations  Other (comment) (TBD)    Recommendations for Other Services       Precautions / Restrictions Precautions Precautions: Fall Required Braces or Orthoses: Other Brace/Splint Other Brace/Splint: Rt BK prosthesis is here at hospital    Mobility  Bed Mobility Overal bed mobility: Needs Assistance;+2 for physical assistance Bed Mobility: Rolling;Supine to Sit;Sit to Sidelying Rolling: +2 for physical assistance;Max assist   Supine to sit: HOB elevated;+2 for physical assistance;Max assist (HOB elevated due to finishing b'fast on arrival)   Sit to sidelying: Max assist;+2 for physical assistance;+2 for safety/equipment General bed mobility comments: continues to need max cues for sequencing; assist to find lt bedrail with RUE for rolling left  Transfers Overall transfer level: Needs assistance Equipment used:  (bedpad; gait belt) Transfers: Sit to/from Stand Sit to Stand: +2 physical assistance;Max assist;From elevated surface          General transfer comment: Attempted to stand x 3 with stedy (tall mirror in front of pt assisting him to maintain head up and midline--until fatigued); better clearance of hips yet unable to achieve height to close stedy seat; attempted to use maximove for OOB to chair however it was not working (took to Forensic scientist and taken out of service)  Ambulation/Gait                 Financial trader Rankin (Stroke Patients Only) Modified Rankin (Stroke Patients Only) Pre-Morbid Rankin Score: Moderately severe disability Modified Rankin: Severe disability     Balance Overall balance assessment: Needs assistance Sitting-balance support: Single extremity supported Sitting balance-Leahy Scale: Zero Sitting balance - Comments: loses balance posterior and to the left, especially with decr attention;                            Cognition Arousal/Alertness: Awake/alert Behavior During Therapy: Impulsive Overall Cognitive Status: Impaired/Different from baseline Area of Impairment: Attention;Following commands;Safety/judgement;Awareness;Problem solving   Current Attention Level: Sustained   Following Commands: Follows one step commands with increased time Safety/Judgement: Decreased awareness of safety;Decreased awareness of deficits Awareness: Intellectual Problem Solving: Requires verbal cues;Requires tactile cues;Difficulty sequencing General Comments: Continues with Lt inattention and requires max cues for turning head from rt side to middle or left; poor awareness of deficits stating he could pivot to recliner after unable to stand x 3 with +2 max assist (and stedy)    Exercises      General Comments  Pertinent Vitals/Pain Pain Assessment: Faces Faces Pain Scale: Hurts little more Pain Location: Lt shoulder Pain Descriptors / Indicators: Sore Pain Intervention(s): Monitored during session;Repositioned (created LUE  "sling" with gait belt to support weight of UE)    Home Living                      Prior Function            PT Goals (current goals can now be found in the care plan section) Acute Rehab PT Goals Patient Stated Goal: wants to walk again Time For Goal Achievement: 11/04/16 Progress towards PT goals: Progressing toward goals    Frequency    Min 4X/week      PT Plan Current plan remains appropriate    Co-evaluation PT/OT/SLP Co-Evaluation/Treatment: Yes Reason for Co-Treatment: Complexity of the patient's impairments (multi-system involvement);For patient/therapist safety PT goals addressed during session: Mobility/safety with mobility;Balance;Strengthening/ROM       End of Session Equipment Utilized During Treatment: Gait belt;Other (comment) (Rt BKA prosthesis) Activity Tolerance: Patient limited by fatigue Patient left: in bed;with call bell/phone within reach;with bed alarm set;with family/visitor present (chair position in bed)     Time: [id number] PT Time Calculation (min) (ACUTE ONLY): 43 min  Charges:  $Therapeutic Activity: 23-37 mins                    G CodesJeanie Cooks Adlene Adduci 11-22-16, 10:22 AM Pager [phone]

## 2016-10-30 NOTE — Progress Notes (Signed)
Orthopedic Tech Progress Note Patient Details:  [name] Cook [birth year] [id number] Patient already has arm sling. Patient ID: [name] Cook, male   DOB: 09-17-1941, 75 y.o.   MRN: [id number]   [name] Cook 10/30/2016, 9:49 PM

## 2016-10-30 NOTE — Progress Notes (Signed)
Occupational Therapy Treatment Patient Details Name: [name] Cook MRN: [id number] DOB: [birth year] Today's Date: 10/30/2016    History of present illness 75 y.o.malewith medical history significant for coronary artery disease status post CABG and subsequent graft stent, type 2 diabetes mellitus, hypertension, hyperlipidemia, and GERD who presents to the ED with weakness involving the left face, left arm, and left leg. he fell at home. MRI brain +Two acute infarcts in the right lateral lenticulostriate   OT comments  Pt benefits from use of rolling mirror for visual input for upright posture. Pt able to sustain static sitting eob longer this session than previous session with neck extension. Pt continues to drift L and posteriorly at EOB without visual input. Pt with decr attention and needs cues to keep visual attention to mirror.    Follow Up Recommendations  CIR    Equipment Recommendations  3 in 1 bedside comode;Wheelchair (measurements OT);Wheelchair cushion (measurements OT);Hospital bed    Recommendations for Other Services Rehab consult    Precautions / Restrictions Precautions Precautions: Fall Required Braces or Orthoses: Other Brace/Splint Other Brace/Splint: Rt BK prosthesis is here at hospital       Mobility Bed Mobility Overal bed mobility: Needs Assistance;+2 for physical assistance Bed Mobility: Rolling;Supine to Sit;Sit to Sidelying Rolling: +2 for physical assistance;Max assist Sidelying to sit: Max assist;+2 for physical assistance;HOB elevated;+2 for safety/equipment Supine to sit: HOB elevated;+2 for physical assistance;Max assist (HOB elevated due to finishing b'fast on arrival) Sit to supine: Max assist;+2 for physical assistance;+2 for safety/equipment Sit to sidelying: Max assist;+2 for physical assistance;+2 for safety/equipment General bed mobility comments: continues to need max cues for sequencing; assist to find lt bedrail with RUE for rolling  left  Transfers Overall transfer level: Needs assistance Equipment used:  (bedpad; gait belt) Transfers: Sit to/from Stand Sit to Stand: +2 physical assistance;Max assist;From elevated surface         General transfer comment: Attempted to stand x 3 with stedy (tall mirror in front of pt assisting him to maintain head up and midline--until fatigued); better clearance of hips yet unable to achieve height to close stedy seat; attempted to use maximove for OOB to chair however it was not working (took to Forensic scientist and taken out of service)    Balance Overall balance assessment: Needs assistance Sitting-balance support: Single extremity supported Sitting balance-Leahy Scale: Zero Sitting balance - Comments: loses balance posterior and to the left, especially with decr attention; Postural control: Posterior lean;Left lateral lean Standing balance support: Single extremity supported Standing balance-Leahy Scale: Zero                     ADL Overall ADL's : Needs assistance/impaired Eating/Feeding: Minimal assistance;Bed level Eating/Feeding Details (indicate cue type and reason): needed cues for upright posture pt with secretions from L side of mouth Grooming: Min guard Grooming Details (indicate cue type and reason): provided cloth and mod cues to wipe face. Pt with decr awareness to secretions             Lower Body Dressing: Total assistance Lower Body Dressing Details (indicate cue type and reason): don R LE prosthetic supine               General ADL Comments: pt with x3 attempts to transfer into steady. Pt with hoyer lift pad placed and hoyer lift unable to sustain battery to tranfer to chair at this time.       Vision  Perception     Praxis      Cognition   Behavior During Therapy: Impulsive Overall Cognitive Status: Impaired/Different from baseline Area of Impairment: Attention;Following  commands;Safety/judgement;Awareness;Problem solving   Current Attention Level: Sustained    Following Commands: Follows one step commands with increased time Safety/Judgement: Decreased awareness of safety;Decreased awareness of deficits Awareness: Intellectual Problem Solving: Requires verbal cues;Requires tactile cues;Difficulty sequencing General Comments: L inattention and requires max cues to scan L. Pt unable to sustain L visual attention. R eye gaze, poor awareness to deficits after x3 attempts to transfer with therapy pt reports "oh you want me to get into the chair I can do that"     Extremity/Trunk Assessment               Exercises     Shoulder Instructions       General Comments      Pertinent Vitals/ Pain       Pain Assessment: Faces Faces Pain Scale: Hurts little more Pain Location: L shoulder Pain Descriptors / Indicators: Sore Pain Intervention(s): Monitored during session;Premedicated before session;Repositioned  Home Living                                          Prior Functioning/Environment              Frequency  Min 3X/week        Progress Toward Goals  OT Goals(current goals can now be found in the care plan section)  Progress towards OT goals: Progressing toward goals  Acute Rehab OT Goals Patient Stated Goal: wants to walk again OT Goal Formulation: With patient Time For Goal Achievement: 11/12/16 Potential to Achieve Goals: Good ADL Goals Pt Will Perform Grooming: with min assist;sitting Pt Will Transfer to Toilet: with +2 assist;with mod assist;bedside commode;stand pivot transfer Pt/caregiver will Perform Home Exercise Program: Left upper extremity;Increased ROM;Increased strength;With minimal assist;With written HEP provided  Plan Discharge plan remains appropriate    Co-evaluation    PT/OT/SLP Co-Evaluation/Treatment: Yes Reason for Co-Treatment: Complexity of the patient's impairments (multi-system  involvement);For patient/therapist safety PT goals addressed during session: Mobility/safety with mobility;Balance;Strengthening/ROM OT goals addressed during session: ADL's and self-care;Strengthening/ROM      End of Session Equipment Utilized During Treatment: Gait belt   Activity Tolerance Patient tolerated treatment well   Patient Left in bed;with call bell/phone within reach;with bed alarm set;with family/visitor present   Nurse Communication Mobility status;Precautions        Time: 1100-1138 OT Time Calculation (min): 38 min  Charges: OT General Charges $OT Visit: 1 Procedure OT Treatments $Therapeutic Activity: 8-22 mins  Parke Poisson B 10/30/2016, 11:43 AM  [name] Cook   OTR/L Pager: [phone] Office: [phone] .

## 2016-10-30 NOTE — Progress Notes (Signed)
Ankit Lorie Phenix, MD Physician Signed Physical Medicine and Rehabilitation  Consult Note Date of Service: 10/29/2016 5:43 AM  Related encounter: ED to Hosp-Admission (Current) from 10/27/2016 in [place] All Collapse All   [] Hide copied text [] Hover for attribution information      Physical Medicine and Rehabilitation Consult Reason for Consult: Right basal ganglia infarct Referring Physician: [facility]   HPI: [name] Cook is a 75 y.o. 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 Per chart review patient lives with spouse. Use wheelchair for primary locomotion and did have a prosthesis for right BKA. Presented 10/27/2016 with fall and reported left-sided weakness and slurred speech. Denied loss of consciousness. No recent headaches reported. CT/MRI showed 2 acute infarcts in the right lateral lenticulostriate distribution. CT angiogram head and neck negative for emergent large vessel occlusion. Pseudoaneurysm of the proximal brachiocephalic artery. Moderate to severe tandem stenosis of the right MCA mid M1 segment and at the right MCA bifurcation as well as high-grade radiographic string sign stenosis of the left ICA. Patient did not receive TPA. Echocardiogram with ejection fraction of [id number] grade 2 diastolic dysfunction. Vascular surgery consulted as well as plan neuro interventional radiology to consider formal cerebral arteriography for further evaluation of innominate artery pseudoaneurysm. Neurology consulted presently on aspirin for CVA prophylaxis. Subcutaneous Lovenox for DVT prophylaxis. Hospital course hypokalemia with supplement added. Physical therapy evaluation completed with recommendations of physical medicine rehabilitation consult.   Review of Systems  Constitutional: Negative for chills and fever.  HENT:  Negative for hearing loss and tinnitus.   Eyes: Negative for blurred vision and double vision.  Respiratory: Negative for cough and shortness of breath.   Cardiovascular: Negative for chest pain.  Gastrointestinal: Positive for constipation. Negative for nausea and vomiting.       GERD Hiccups  Genitourinary: Positive for urgency. Negative for dysuria and hematuria.  Skin: Negative for rash.  Neurological: Positive for speech change, focal weakness and weakness. Negative for tremors, sensory change, seizures and loss of consciousness.  All other systems reviewed and are negative.      Past Medical History:  Diagnosis Date  . Arthritis   . CAD (coronary artery disease)    2014 75% left main stenosis, LAD 80% followed by a mid subtotal stenosis, circumflex 90% stenosis, right coronary artery occluded, SVG to RCA occluded, SVG ramus occluded, SVG to OM patent without a sequential anastomosis noted. Graft had a focal 99% stenosis which was stented. 3.5 x 16 mm premier. b. cath 10/13/2014 DES to SVG to OM overlap previous stent  . Cholelithiasis 2014  . Cogan syndrome   . Colon polyps   . Diabetes mellitus without complication ([place])   . Dyslipidemia   . Gallstones   . GERD (gastroesophageal reflux disease)   . Hepatic hemangioma 2014  . Hx of right BKA ([place])    a. 2/2 necrotizing fasciitis.  . Hypertension   . Kidney stones   . Myocardial infarction 1999  . Necrotizing fasciitis ([place])   . Peptic ulcer disease   . Peripheral vascular disease [facility])         Past Surgical History:  Procedure Laterality Date  . APPENDECTOMY  2007  . CORONARY ARTERY BYPASS GRAFT  1999   LIMA to the LAD, saphenous vein graft to obtuse marginal 1 and obtuse marginal 2, spahenous vein  graft to intermediate, spahenous vein graft to the right coronary artery in 1999  . LEFT HEART CATHETERIZATION WITH CORONARY ANGIOGRAM N/A 10/13/2014   Procedure: LEFT HEART CATHETERIZATION WITH  CORONARY ANGIOGRAM;  Surgeon: Peter M Martinique, MD;  Location: [facility] CATH LAB;  Service: Cardiovascular;  Laterality: N/A;  . LEFT HEART CATHETERIZATION WITH CORONARY/GRAFT ANGIOGRAM N/A 07/19/2013   Procedure: LEFT HEART CATHETERIZATION WITH Beatrix Fetters;  Surgeon: Peter M Martinique, MD;  Location: [facility] CATH LAB;  Service: Cardiovascular;  Laterality: N/A;  . LEG AMPUTATION  1999   Right BKA  . PERCUTANEOUS CORONARY STENT INTERVENTION (PCI-S)  07/19/2013   Procedure: PERCUTANEOUS CORONARY STENT INTERVENTION (PCI-S);  Surgeon: Peter M Martinique, MD;  Location: [facility] CATH LAB;  Service: Cardiovascular;;         Family History  Problem Relation Age of Onset  . Hyperlipidemia Mother   . Hypertension Mother   . Other Mother     varicose veins  . Heart disease Mother   . Rheum arthritis Father   . Heart attack Father   . Heart disease Maternal Grandfather   . Diabetes Sister   . Colon cancer Neg Hx    Social History:  reports that he quit smoking about 18 years ago. His smoking use included Cigarettes. He started smoking about 53 years ago. He has never used smokeless tobacco. He reports that he drinks about 8.4 oz of alcohol per week . He reports that he does not use drugs. Allergies:       Allergies  Allergen Reactions  . Altace [Ramipril] Other (See Comments)    hypotension         Medications Prior to Admission  Medication Sig Dispense Refill  . acetaminophen (TYLENOL) 500 MG tablet Take 1,000 mg by mouth every 6 (six) hours as needed for mild pain.     Marland Kitchen amLODipine (NORVASC) 5 MG tablet Take 1 tablet by mouth  daily 90 tablet 3  . aspirin 81 MG tablet Take 81 mg by mouth daily.      Marland Kitchen atorvastatin (LIPITOR) 40 MG tablet Take 1 tablet (40 mg total) by mouth daily. 90 tablet 3  . Calcium Carbonate-Vitamin D (CALCIUM-CARB 600 + D) 600-125 MG-UNIT TABS Take 1 tablet by mouth daily.      . chlorthalidone (HYGROTON) 25 MG tablet TAKE 1 TABLET (25 MG TOTAL) BY  MOUTH DAILY. 90 tablet 1  . cloNIDine (CATAPRES) 0.1 MG tablet TAKE 1 TABLET AS NEEDED FOR BLOOD PRESSURES GREATER THAN 160/100 90 tablet 1  . doxylamine, Sleep, (UNISOM) 25 MG tablet Take 25 mg by mouth at bedtime.    . finasteride (PROSCAR) 5 MG tablet Take 5 mg by mouth daily.    Marland Kitchen lisinopril (PRINIVIL,ZESTRIL) 20 MG tablet TAKE 1 TABLET (20 MG TOTAL) BY MOUTH DAILY. 90 tablet 1  . metoprolol succinate (TOPROL-XL) 50 MG 24 hr tablet TAKE 1 TABLET BY MOUTH DAILY WITH OR IMMEDIATELY FOLLOWING A MEAL 90 tablet 1  . Multiple Vitamin (MULTIVITAMIN) tablet Take 1 tablet by mouth daily.      . nitroGLYCERIN (NITROSTAT) 0.4 MG SL tablet Place 1 tablet (0.4 mg total) under the tongue every 5 (five) minutes as needed for chest pain. 25 tablet 3  . Probiotic Product (RESTORA) CAPS Take 1 capsule by mouth daily. 30 capsule 6  . ranitidine (ZANTAC) 75 MG tablet Take 75-150 mg by mouth See admin instructions. 75 mg in the am, 150 mg at bedtime    . tamsulosin (FLOMAX) 0.4 MG CAPS  Take 0.8 mg by mouth daily.     Marland Kitchen EFFIENT 10 MG TABS tablet Take 1 tablet by mouth  daily (Patient not taking: Reported on 10/27/2016) 90 tablet 2  . metFORMIN (GLUCOPHAGE) 500 MG tablet Take 1 tablet by mouth  daily (Patient not taking: Reported on 10/27/2016) 90 tablet 3  . rosuvastatin (CRESTOR) 10 MG tablet Take 1 tablet (10 mg total) by mouth daily. (Patient not taking: Reported on 10/27/2016) 90 tablet 3    Home: [place] expects to be discharged to:: Private residence Living Arrangements: Spouse/significant other Type of Home: House Home Equipment: Wheelchair - manual  Lives With: Spouse  Functional History: Prior Function Level of Independence: Needs assistance Gait / Transfers Assistance Needed: used w/c for primary locomotion; w/c did not fit into bathroom, he installed grab bars and used prosthesis to walk in/out Functional Status:  Mobility: Bed Mobility Overal bed mobility: Needs  Assistance, +2 for physical assistance Bed Mobility: Rolling, Sidelying to Sit, Sit to Supine Rolling: Total assist, +2 for physical assistance (Rt) Sidelying to sit: Max assist, +2 for physical assistance, HOB elevated, +2 for safety/equipment Sit to supine: Max assist, +2 for physical assistance, +2 for safety/equipment General bed mobility comments: rolled to Rt x 2; second attempt pt able to reach for his left elbow and bring LUE partially across body; using RUE on bedrail; pt assisted with directing his torso as returning to supine Transfers Overall transfer level: Needs assistance Equipment used: None Transfers: Sit to/from Stand General transfer comment: attempted from elevated EOB x 2 with 2 person moderate assist and only able to clear his hips ~4" off mattress; retruned to sit EOB      ADL:    Cognition: Cognition Overall Cognitive Status: Impaired/Different from baseline Arousal/Alertness: Awake/alert Orientation Level: Oriented X4 Attention: Sustained Sustained Attention: Impaired Sustained Attention Impairment: Verbal basic Memory: Impaired Memory Impairment: Decreased recall of new information Awareness: Impaired Awareness Impairment: Emergent impairment, Anticipatory impairment Problem Solving: Impaired Problem Solving Impairment: Verbal basic, Functional basic Safety/Judgment: Impaired Cognition Arousal/Alertness: Awake/alert Behavior During Therapy: Impulsive Overall Cognitive Status: Impaired/Different from baseline Area of Impairment: Attention, Following commands, Safety/judgement, Awareness, Problem solving Current Attention Level: Sustained Following Commands: Follows one step commands with increased time Safety/Judgement: Decreased awareness of safety, Decreased awareness of deficits Awareness: Intellectual Problem Solving: Requires verbal cues, Requires tactile cues General Comments: Patient with head turned to his right unless cued to look to his  left (which he could then do). He neglected LUE and several times required prompt to "find his lt arm."  Blood pressure (!) 171/68, pulse 81, temperature 98.8 F (37.1 C), temperature source Axillary, resp. rate 20, height 5\' 8"  (1.727 m), weight 92 kg (202 lb 12.8 oz), SpO2 95 %. Physical Exam  Vitals reviewed. Constitutional: He appears well-developed and well-nourished.  HENT:  Head: Normocephalic and atraumatic.  Eyes: Conjunctivae and EOM are normal.  Neck: Normal range of motion. Neck supple. No thyromegaly present.  Cardiovascular: Normal rate and regular rhythm.   Respiratory: No respiratory distress.  Upper airway congestion with limited inspiratory effort  GI: Soft. Bowel sounds are normal. He exhibits no distension. There is no tenderness.  Musculoskeletal: He exhibits no edema or tenderness.  Right BKA  Neurological: He is alert.  Patient is able to provide his name and age.  Speech is a bit dysarthric but intelligible.  Follows simple commands Left facial weakness Sensation intact to light touch throughout Motor: LUE/LLE: 0/5 RUE: 5/5 proximal to distal RLE: HF  5/5  Skin: Skin is warm and dry.  Psychiatric: He has a normal mood and affect. His behavior is normal.    Lab Results Last 24 Hours       Results for orders placed or performed during the hospital encounter of 10/27/16 (from the past 24 hour(s))  Basic metabolic panel     Status: Abnormal   Collection Time: 10/28/16 11:47 AM  Result Value Ref Range   Sodium 138 135 - 145 mmol/L   Potassium 3.1 (L) 3.5 - 5.1 mmol/L   Chloride 101 101 - 111 mmol/L   CO2 26 22 - 32 mmol/L   Glucose, Bld 143 (H) 65 - 99 mg/dL   BUN 11 6 - 20 mg/dL   Creatinine, Ser 1.07 0.61 - 1.24 mg/dL   Calcium 9.2 8.9 - 10.3 mg/dL   GFR calc non Af Amer >60 >60 mL/min   GFR calc Af Amer >60 >60 mL/min   Anion gap 11 5 - 15  Glucose, capillary     Status: Abnormal   Collection Time: 10/28/16  1:19 PM  Result Value  Ref Range   Glucose-Capillary 127 (H) 65 - 99 mg/dL  Glucose, capillary     Status: Abnormal   Collection Time: 10/28/16  4:45 PM  Result Value Ref Range   Glucose-Capillary 122 (H) 65 - 99 mg/dL  Urine rapid drug screen (hosp performed)not at [facility]     Status: Abnormal   Collection Time: 10/28/16  5:44 PM  Result Value Ref Range   Opiates POSITIVE (A) NONE DETECTED   Cocaine NONE DETECTED NONE DETECTED   Benzodiazepines NONE DETECTED NONE DETECTED   Amphetamines NONE DETECTED NONE DETECTED   Tetrahydrocannabinol NONE DETECTED NONE DETECTED   Barbiturates NONE DETECTED NONE DETECTED  Urinalysis, Routine w reflex microscopic (not at [facility])     Status: Abnormal   Collection Time: 10/28/16  5:44 PM  Result Value Ref Range   Color, Urine YELLOW YELLOW   APPearance CLEAR CLEAR   Specific Gravity, Urine >1.046 (H) 1.005 - 1.030   pH 6.0 5.0 - 8.0   Glucose, UA NEGATIVE NEGATIVE mg/dL   Hgb urine dipstick NEGATIVE NEGATIVE   Bilirubin Urine NEGATIVE NEGATIVE   Ketones, ur 15 (A) NEGATIVE mg/dL   Protein, ur 30 (A) NEGATIVE mg/dL   Nitrite NEGATIVE NEGATIVE   Leukocytes, UA NEGATIVE NEGATIVE  Urine microscopic-add on     Status: Abnormal   Collection Time: 10/28/16  5:44 PM  Result Value Ref Range   Squamous Epithelial / LPF 0-5 (A) NONE SEEN   WBC, UA 0-5 0 - 5 WBC/hpf   RBC / HPF 0-5 0 - 5 RBC/hpf   Bacteria, UA RARE (A) NONE SEEN   Casts HYALINE CASTS (A) NEGATIVE   Urine-Other MUCOUS PRESENT   Glucose, capillary     Status: Abnormal   Collection Time: 10/28/16  8:43 PM  Result Value Ref Range   Glucose-Capillary 120 (H) 65 - 99 mg/dL   Comment 1 Notify RN    Comment 2 Document in Chart   Glucose, capillary     Status: None   Collection Time: 10/29/16 12:08 AM  Result Value Ref Range   Glucose-Capillary 99 65 - 99 mg/dL   Comment 1 Notify RN    Comment 2 Document in Chart   Glucose, capillary     Status: Abnormal   Collection  Time: 10/29/16  4:26 AM  Result Value Ref Range   Glucose-Capillary 109 (H) 65 - 99 mg/dL  Comment 1 Notify RN    Comment 2 Document in Chart       Imaging Results (Last 48 hours)  Ct Angio Head W Or Wo Contrast  Result Date: 10/28/2016 CLINICAL DATA:  75 year old male with right lenticulostriate territory acute infarcts on MRI performed for a left side weakness and fall. Initial encounter. EXAM: CT ANGIOGRAPHY HEAD AND NECK TECHNIQUE: Multidetector CT imaging of the head and neck was performed using the standard protocol during bolus administration of intravenous contrast. Multiplanar CT image reconstructions and MIPs were obtained to evaluate the vascular anatomy. Carotid stenosis measurements (when applicable) are obtained utilizing NASCET criteria, using the distal internal carotid diameter as the denominator. CONTRAST:  50 mL Isovue 370 COMPARISON:  Brain MRI 0956 hours today and head CT 10/27/2016. FINDINGS: CTA NECK Skeleton: Prior median sternotomy. No acute osseous abnormality identified. Visualized paranasal sinuses and mastoids are stable and well pneumatized. Upper chest: Centrilobular emphysema. Mild nonspecific dependent pulmonary opacity. No superior mediastinal lymphadenopathy. Other neck: Negative thyroid, larynx, pharynx, parapharyngeal spaces, retropharyngeal space, sublingual space, submandibular glands and parotid glands. No cervical lymphadenopathy. Negative orbit and scalp soft tissues. Aortic arch: Sequelae of CABG. Extensive arch calcified atherosclerosis. Three vessel arch configuration. Right carotid system: 7 mm diameter pseudoaneurysm along the proximal (right lateral) aspect of the brachia a cephalic artery as seen on series 8, image 121 and series 5, image 162. No associated stenosis of the proximal brachiocephalic. Soft and calcified plaque of the brachiocephalic artery. No right CCA origin despite soft plaque. Soft and calcified plaque intermittently in the right  CCA without stenosis proximal to the right carotid bifurcation. At the bifurcation there is calcified plaque with less than 50 % stenosis with respect to the distal vessel of the proximal right ICA. Calcified plaque in the distal cervical right ICA just below the skullbase also without stenosis. Left carotid system: No left CCA origin stenosis. Intermittent soft and calcified plaque in the left CCA without stenosis proximal to the bifurcation. At the left carotid bifurcation there is bulky in confluent calcified plaque resulting in a Radiographic String Sign Stenosis of the left ICA origin as seen on series 7, image 215 and series 8, image 102. Calcified plaque continues into the left ICA bulb. Negative cervical left ICA otherwise. Vertebral arteries:Soft and calcified plaque in the proximal right subclavian artery without stenosis. Soft plaque at the right vertebral artery origin without hemodynamically significant stenosis (series 7, image 298). Calcified plaque in the right V1 segment, and at the skullbase without associated stenosis. Soft and calcified plaque in the proximal left subclavian artery without hemodynamically significant stenosis. Soft plaque at the left vertebral artery origin without stenosis (series 8, image 146). Intermittent soft and calcified plaque in the left V2 segment with up to mild stenosis. CTA HEAD Posterior circulation: Bilateral distal vertebral artery calcified plaque. No hemodynamically significant stenosis of the distal right vertebral artery, but there is up severe tandem stenosis of the left vertebral artery V4 segment (series 7, image 151) in the left PICA region and at the vertebrobasilar junction. The basilar remains patent. Both AICA origins appear patent. No basilar stenosis. Mild basilar regularity. Normal SCA and PCA origins. Posterior communicating arteries are diminutive or absent. Mild right PCA irregularity. Severe left PCA P2 segment stenosis best seen on series 10,  image 23 with preserved distal enhancement. Anterior circulation: Both ICA siphons are patent, but there is severe calcified siphon atherosclerosis. Still, there is no definite siphon hemodynamically significant stenosis. Normal ophthalmic artery origins.  Patent carotid termini. Patent ACA origins. Mild irregularity and stenosis left ACA A1 segment. Anterior communicating artery and bilateral ACA branches are within normal limits. Left MCA origin and M1 segment are normal. Left MCA bifurcation is patent. Mild irregularity of the left MCA M3 branches. Right MCA origin is patent without stenosis, but there is moderate to severe stenosis in the mid right M1 segment and again at the right MCA bifurcation (series 16, image 26). No right discrete MCA branch occlusion is identified although there are is a paucity of enhancing anterior division right M2 branches (series 12, image 12). Venous sinuses: Patent. Anatomic variants: None. Delayed phase: Mild progression of confluent right basal ganglia hypodensity since the head CT yesterday. No associated hemorrhage or mass effect. Otherwise stable gray-white matter differentiation. No superimposed acute cortically based infarct identified. No abnormal enhancement identified. Review of the MIP images confirms the above findings IMPRESSION: 1. Severe atherosclerosis. Negative for emergent large vessel occlusion. 2. Pseudoaneurysm of the proximal Brachiocephalic Artery (see series 8, image 121). Recommend Vascular Surgery consultation. 3. 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 No discrete right MCA branch occlusion identified although there is a paucity of enhancing anterior division M2/M3 branches. 4. Extensive Right carotid atherosclerosis without hemodynamically significant stenosis. 5. High-grade RADIOGRAPHIC STRING SIGN STENOSIS of the Left ICA origin due to bulky calcified plaque. 6. Severe tandem stenoses of the  distal Left Vertebral Artery V4 segment. 7. Severe stenosis Left PCA P2 segment. 8. Expected evolution on CT of the acute right basal ganglia infarcts. No associated hemorrhage or mass effect. 9. Emphysema. Electronically Signed   By: Genevie Ann M.D.   On: 10/28/2016 13:00   Dg Chest 1 View  Result Date: 10/28/2016 CLINICAL DATA:  CVA with left-sided weakness. The patient ports nausea and vomiting. History of diabetes, hypertension, previous CP 80. EXAM: CHEST 1 VIEW COMPARISON:  Chest x-ray of October 12, 2014 FINDINGS: The lungs are less well inflated today. There is patchy density in the right perihilar region. There is no alveolar infiltrate. There is no pleural effusion. The cardiac silhouette is enlarged. There is some crowding of the central pulmonary vascularity. The patient has undergone previous CABG. There is calcification in the wall of the thoracic aorta. The observed bony structures exhibit no acute abnormalities. IMPRESSION: Mild hypo inflation accentuates the lung markings. However, there is likely low-grade CHF with mild interstitial edema. Mildly increased density in the right perihilar region may reflect subsegmental atelectasis. When the patient can tolerate the procedure, a PA and lateral chest x-ray with better inspiratory effort would be useful. Thoracic aortic atherosclerosis. Electronically Signed   By: David  Martinique M.D.   On: 10/28/2016 11:46   Ct Head Wo Contrast  Result Date: 10/27/2016 CLINICAL DATA:  Left-sided weakness.  Multiple recent falls. EXAM: CT HEAD WITHOUT CONTRAST TECHNIQUE: Contiguous axial images were obtained from the base of the skull through the vertex without intravenous contrast. COMPARISON:  01/25/2016 FINDINGS: Brain: Negative for acute intracranial hemorrhage or extra-axial fluid collection. There is focal hypodensity in the right caudate extending into the right lentiform nuclei, likely subacute infarction. No significant mass effect. Unchanged  ventriculomegaly. Remote left corona radiata infarction, unchanged. Probable remote cerebellar infarctions, unchanged. Vascular: No hyperdense vessel or unexpected calcification. Skull: Normal. Negative for fracture or focal lesion. Sinuses/Orbits: No acute finding. IMPRESSION: 1. New focal hypodensity measuring approximately 2.5 cm in the right caudate, extending into the right lentiform nuclei, likely  subacute infarction. No mass effect or midline shift. Recommend brain MRI to characterize. 2. Unchanged ventriculomegaly and remote infarctions involving the left corona radiata and both cerebellar hemispheres. Electronically Signed   By: Andreas Newport M.D.   On: 10/27/2016 23:17   Ct Angio Neck W Or Wo Contrast  Result Date: 10/28/2016 CLINICAL DATA:  75 year old male with right lenticulostriate territory acute infarcts on MRI performed for a left side weakness and fall. Initial encounter. EXAM: CT ANGIOGRAPHY HEAD AND NECK TECHNIQUE: Multidetector CT imaging of the head and neck was performed using the standard protocol during bolus administration of intravenous contrast. Multiplanar CT image reconstructions and MIPs were obtained to evaluate the vascular anatomy. Carotid stenosis measurements (when applicable) are obtained utilizing NASCET criteria, using the distal internal carotid diameter as the denominator. CONTRAST:  50 mL Isovue 370 COMPARISON:  Brain MRI 0956 hours today and head CT 10/27/2016. FINDINGS: CTA NECK Skeleton: Prior median sternotomy. No acute osseous abnormality identified. Visualized paranasal sinuses and mastoids are stable and well pneumatized. Upper chest: Centrilobular emphysema. Mild nonspecific dependent pulmonary opacity. No superior mediastinal lymphadenopathy. Other neck: Negative thyroid, larynx, pharynx, parapharyngeal spaces, retropharyngeal space, sublingual space, submandibular glands and parotid glands. No cervical lymphadenopathy. Negative orbit and scalp soft  tissues. Aortic arch: Sequelae of CABG. Extensive arch calcified atherosclerosis. Three vessel arch configuration. Right carotid system: 7 mm diameter pseudoaneurysm along the proximal (right lateral) aspect of the brachia a cephalic artery as seen on series 8, image 121 and series 5, image 162. No associated stenosis of the proximal brachiocephalic. Soft and calcified plaque of the brachiocephalic artery. No right CCA origin despite soft plaque. Soft and calcified plaque intermittently in the right CCA without stenosis proximal to the right carotid bifurcation. At the bifurcation there is calcified plaque with less than 50 % stenosis with respect to the distal vessel of the proximal right ICA. Calcified plaque in the distal cervical right ICA just below the skullbase also without stenosis. Left carotid system: No left CCA origin stenosis. Intermittent soft and calcified plaque in the left CCA without stenosis proximal to the bifurcation. At the left carotid bifurcation there is bulky in confluent calcified plaque resulting in a Radiographic String Sign Stenosis of the left ICA origin as seen on series 7, image 215 and series 8, image 102. Calcified plaque continues into the left ICA bulb. Negative cervical left ICA otherwise. Vertebral arteries:Soft and calcified plaque in the proximal right subclavian artery without stenosis. Soft plaque at the right vertebral artery origin without hemodynamically significant stenosis (series 7, image 298). Calcified plaque in the right V1 segment, and at the skullbase without associated stenosis. Soft and calcified plaque in the proximal left subclavian artery without hemodynamically significant stenosis. Soft plaque at the left vertebral artery origin without stenosis (series 8, image 146). Intermittent soft and calcified plaque in the left V2 segment with up to mild stenosis. CTA HEAD Posterior circulation: Bilateral distal vertebral artery calcified plaque. No hemodynamically  significant stenosis of the distal right vertebral artery, but there is up severe tandem stenosis of the left vertebral artery V4 segment (series 7, image 151) in the left PICA region and at the vertebrobasilar junction. The basilar remains patent. Both AICA origins appear patent. No basilar stenosis. Mild basilar regularity. Normal SCA and PCA origins. Posterior communicating arteries are diminutive or absent. Mild right PCA irregularity. Severe left PCA P2 segment stenosis best seen on series 10, image 23 with preserved distal enhancement. Anterior circulation: Both ICA siphons are patent,  but there is severe calcified siphon atherosclerosis. Still, there is no definite siphon hemodynamically significant stenosis. Normal ophthalmic artery origins. Patent carotid termini. Patent ACA origins. Mild irregularity and stenosis left ACA A1 segment. Anterior communicating artery and bilateral ACA branches are within normal limits. Left MCA origin and M1 segment are normal. Left MCA bifurcation is patent. Mild irregularity of the left MCA M3 branches. Right MCA origin is patent without stenosis, but there is moderate to severe stenosis in the mid right M1 segment and again at the right MCA bifurcation (series 16, image 26). No right discrete MCA branch occlusion is identified although there are is a paucity of enhancing anterior division right M2 branches (series 12, image 12). Venous sinuses: Patent. Anatomic variants: None. Delayed phase: Mild progression of confluent right basal ganglia hypodensity since the head CT yesterday. No associated hemorrhage or mass effect. Otherwise stable gray-white matter differentiation. No superimposed acute cortically based infarct identified. No abnormal enhancement identified. Review of the MIP images confirms the above findings IMPRESSION: 1. Severe atherosclerosis. Negative for emergent large vessel occlusion. 2. Pseudoaneurysm of the proximal Brachiocephalic Artery (see series 8,  image 121). Recommend Vascular Surgery consultation. 3. 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 No discrete right MCA branch occlusion identified although there is a paucity of enhancing anterior division M2/M3 branches. 4. Extensive Right carotid atherosclerosis without hemodynamically significant stenosis. 5. High-grade RADIOGRAPHIC STRING SIGN STENOSIS of the Left ICA origin due to bulky calcified plaque. 6. 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 7. Severe stenosis Left PCA P2 segment. 8. Expected evolution on CT of the acute right basal ganglia infarcts. No associated hemorrhage or mass effect. 9. Emphysema. Electronically Signed   By: Genevie Ann M.D.   On: 10/28/2016 13:00   Mr Brain Wo Contrast  Result Date: 10/28/2016 CLINICAL DATA:  Left-sided weakness.  Fall at home. EXAM: MRI HEAD WITHOUT CONTRAST TECHNIQUE: Multiplanar, multiecho pulse sequences of the brain and surrounding structures were obtained without intravenous contrast. COMPARISON:  Head CT from yesterday FINDINGS: Brain: Restricted diffusion in a wedge-shaped area extending from the right anterior putamen through the anterior limb internal capsule to the caudate head. Distinct second area of acute infarct or posteriorly with the same morphology, extending from putamen to caudate body. No acute hemorrhage. Chronic microvascular disease with moderate ischemic gliosis throughout cerebral white matter. Small remote left frontal infarct at the vertex. There is lateral and third ventriculomegaly that is likely from atrophy. No temporal horn dilatation typical of an obstructive for communicating hydrocephalus. Vascular: Preserved flow voids.  No focal marrow lesion. Skull and upper cervical spine: Negative Sinuses/Orbits: Negative IMPRESSION: 1. Two acute infarcts in the right lateral lenticulostriate distribution. 2. Chronic microvascular disease and small  remote left frontal infarct. 3. Lateral and third ventriculomegaly, favor atrophy over communicating hydrocephalus. Electronically Signed   By: Monte Fantasia M.D.   On: 10/28/2016 11:24     Assessment/Plan: Diagnosis: Right basal ganglia infarct Labs and images independently reviewed.  Records reviewed and summated above. Stroke: Continue secondary stroke prophylaxis and Risk Factor Modification listed below:   Antiplatelet therapy:   Blood Pressure Management:  Continue current medication with prn's with permisive HTN per primary team Statin Agent:   Prediabetes management:   Left sided hemiparesis: fit for orthosis to prevent contractures (resting hand splint for day, wrist cock up splint at night, PRAFO, etc) Motor recovery: Fluoxetine  1. Does  the need for close, 24 hr/day medical supervision in concert with the patient's rehab needs make it unreasonable for this patient to be served in a less intensive setting? Yes  2. Co-Morbidities requiring supervision/potential complications: CAD s/p CABG (cont meds), HTN (monitor and provide prns in accordance with increased physical exertion and pain), right BKA (cont prosthesis), hypokalemia (continue to monitor and replete as necessary), ?UTI (treat if necessary), Carotid artery stenosis (plans per vascular/neurointerventional radiology due to complexity) 3. Due to bladder management, safety, skin/wound care, disease management and patient education, does the patient require 24 hr/day rehab nursing? Yes 4. Does the patient require coordinated care of a physician, rehab nurse, PT (1-2 hrs/day, 5 days/week), OT (1-2 hrs/day, 5 days/week) and SLP (1-2 hrs/day, 5 days/week) to address physical and functional deficits in the context of the above medical diagnosis(es)? Yes Addressing deficits in the following areas: balance, endurance, locomotion, strength, transferring, bowel/bladder control, bathing, dressing, toileting, speech, swallowing and  psychosocial support 5. Can the patient actively participate in an intensive therapy program of at least 3 hrs of therapy per day at least 5 days per week? Yes 6. The potential for patient to make measurable gains while on inpatient rehab is fair 7. Anticipated functional outcomes upon discharge from inpatient rehab are max assist  with PT, max assist with OT, supervision and min assist with SLP. 8. Estimated rehab length of stay to reach the above functional goals is: 16-19 days. 9. Does the patient have adequate social supports and living environment to accommodate these discharge functional goals? Potentially 10. Anticipated D/C setting: Home 11. Anticipated post D/C treatments: HH therapy and Home excercise program 12. 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  Wife will be unable to care for him by herself at discharge.  If additional caregiver support unavailable, would consider SNF after completion of medical workup.   Patient has agreed to participate in recommended program. Potentially Note that insurance prior authorization may be required for reimbursement for recommended care.  Comment: Rehab Admissions Coordinator to follow up.  Delice Lesch, MD, [facility] 10/29/2016

## 2016-10-30 NOTE — Discharge Summary (Signed)
Physician Discharge Summary  Drayden Cleckler [id number] DOB: 1940/12/25 DOA: 10/27/2016  PCP: Wardell Honour, MD  Admit date: 10/27/2016 Discharge date: 10/30/2016   Recommendations for Outpatient Follow-Up:   To CIR 30 day event monitor Outpatient vascular follow up Continue DAPT for 3 months and then plavix alone. Long-term BP goal 130-150 due to left ICA stenosis  Discharge Diagnosis:   Principal Problem:   Ischemic stroke [facility]) Active Problems:   Dyslipidemia   Essential hypertension   Coronary atherosclerosis   GERD (gastroesophageal reflux disease)   Hypokalemia   Diabetes mellitus with circulatory complication (HCC)   Left-sided weakness   Acute ischemic stroke [facility])   CVA (cerebral vascular accident) ([place])   Bilateral carotid artery stenosis   Cerebral infarction ([place])   Hypertensive crisis   Acute lower UTI   Stenosis of carotid artery   Prediabetes   Discharge disposition:  CIR  Discharge Condition: Improved.  Diet recommendation: DYS 2 honey thick  Wound care: None.   History of Present Illness:   [name] Cook is a 75 y.o. male with medical history significant for coronary artery disease status post CABG and subsequent graft stent, type 2 diabetes mellitus, hypertension, hyperlipidemia, and GERD who presents to the ED with weakness involving the left face, left arm, and left leg. Patient reports that he had been in his usual state of health until he fell at home this morning at approximately 9 AM. He had difficulty getting up from the floor, noting that his left arm and leg were weak. He later realized that his left face was also weak and drooping. He denies any associated chest pain or palpitations and denies any recent headaches, change in vision or hearing, loss of coordination, or numbness. He denies striking his head when he fell and denies ever experiencing similar symptoms previously. There has been no recent fevers or chills, no chest  pain or palpitations, and no cough or dyspnea.    Hospital Course by Problem:   Stroke:  Non-dominant right basal ganglia infarct - etiology uncertain.  1. Most likely due to intracranial / extracranial atherosclerosis - diffuse intracranial stenosis on CTA head, tandem stenosis right M1 and bifurcation, non flowing limiting calcified plaques at ICA origin and ICA siphone 2. 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 cardioembolic - pt denies heart palpitation, will recommend 30 day cardiac event monitoring   Resultant  Left hemiparesis, left facial droop  MRI  R lateral lenticulostriate infarcts. Old small L frontal infarct.   CTA head and neck - severe atherosclerosis. Pseudoaneurysm of proximal brachiocephalic artery. Mod to severe stenosis R M1 and MCA bifurcations, L V4, L P2. L ICA string sign.   2D Echo  EF 65-70%. No source of embolus   Recommend 30 day cardiac event monitoring as outpt  LDL 180  HgbA1c 5.9  DIET DYS 2 Room service appropriate? Yes; Fluid consistency: Honey Thick-- high aspiration risk  aspirin 81 mg daily prior to admission, now on DAPT. Continue DAPT for 3 months and then plavix alone.    CIR   Left ICA high grade stenosis, asymptomatic  Pt no hx of stroke in the past  No left stroke this time  MRI did showed remote left MCA small infarct  VVS follow up as outpt to consider elective left CEA if pt recovers well from current stroke.  Hypertension  Elevated, as high as 207-64  From stroke standpoint, permissive hypertension (OK if < 220/120) but gradually  normalize in 5-7 days  Long-term BP goal 130-150 due to left ICA stenosis  Hyperlipidemia  Home meds:  lipitor 40, resumed in hospital. Also listed on crestor 10  LDL 180, goal < 70  lipitor increased in hospital to 80 mg daily  Continue statin at discharge       Medical Consultants:    Neuro  vascular   Discharge  Exam:   Vitals:   10/30/16 0606 10/30/16 0805  BP: (!) 185/66 (!) 170/70  Pulse: 98 90  Resp: 18 20  Temp: 99.4 F (37.4 C) 97.7 F (36.5 C)   Vitals:   10/29/16 2205 10/30/16 0126 10/30/16 0606 10/30/16 0805  BP: (!) 183/70 (!) 169/70 (!) 185/66 (!) 170/70  Pulse: 100 95 98 90  Resp: 18 18 18 20   Temp: 99.4 F (37.4 C) 99.8 F (37.7 C) 99.4 F (37.4 C) 97.7 F (36.5 C)  TempSrc: Oral Oral Axillary Axillary  SpO2: 96% 96% 96% 96%  Weight:      Height:          The results of significant diagnostics from this hospitalization (including imaging, microbiology, ancillary and laboratory) are listed below for reference.     Procedures and Diagnostic Studies:   Ct Angio Head W Or Wo Contrast  Result Date: 10/28/2016 CLINICAL DATA:  75 year old male with right lenticulostriate territory acute infarcts on MRI performed for a left side weakness and fall. Initial encounter. EXAM: CT ANGIOGRAPHY HEAD AND NECK TECHNIQUE: Multidetector CT imaging of the head and neck was performed using the standard protocol during bolus administration of intravenous contrast. Multiplanar CT image reconstructions and MIPs were obtained to evaluate the vascular anatomy. Carotid stenosis measurements (when applicable) are obtained utilizing NASCET criteria, using the distal internal carotid diameter as the denominator. CONTRAST:  50 mL Isovue 370 COMPARISON:  Brain MRI 0956 hours today and head CT 10/27/2016. FINDINGS: CTA NECK Skeleton: Prior median sternotomy. No acute osseous abnormality identified. Visualized paranasal sinuses and mastoids are stable and well pneumatized. Upper chest: Centrilobular emphysema. Mild nonspecific dependent pulmonary opacity. No superior mediastinal lymphadenopathy. Other neck: Negative thyroid, larynx, pharynx, parapharyngeal spaces, retropharyngeal space, sublingual space, submandibular glands and parotid glands. No cervical lymphadenopathy. Negative orbit and scalp soft  tissues. Aortic arch: Sequelae of CABG. Extensive arch calcified atherosclerosis. Three vessel arch configuration. Right carotid system: 7 mm diameter pseudoaneurysm along the proximal (right lateral) aspect of the brachia a cephalic artery as seen on series 8, image 121 and series 5, image 162. No associated stenosis of the proximal brachiocephalic. Soft and calcified plaque of the brachiocephalic artery. No right CCA origin despite soft plaque. Soft and calcified plaque intermittently in the right CCA without stenosis proximal to the right carotid bifurcation. At the bifurcation there is calcified plaque with less than 50 % stenosis with respect to the distal vessel of the proximal right ICA. Calcified plaque in the distal cervical right ICA just below the skullbase also without stenosis. Left carotid system: No left CCA origin stenosis. Intermittent soft and calcified plaque in the left CCA without stenosis proximal to the bifurcation. At the left carotid bifurcation there is bulky in confluent calcified plaque resulting in a Radiographic String Sign Stenosis of the left ICA origin as seen on series 7, image 215 and series 8, image 102. Calcified plaque continues into the left ICA bulb. Negative cervical left ICA otherwise. Vertebral arteries:Soft and calcified plaque in the proximal right subclavian artery without stenosis. Soft plaque at the right vertebral artery origin  without hemodynamically significant stenosis (series 7, image 298). Calcified plaque in the right V1 segment, and at the skullbase without associated stenosis. Soft and calcified plaque in the proximal left subclavian artery without hemodynamically significant stenosis. Soft plaque at the left vertebral artery origin without stenosis (series 8, image 146). Intermittent soft and calcified plaque in the left V2 segment with up to mild stenosis. CTA HEAD Posterior circulation: Bilateral distal vertebral artery calcified plaque. No hemodynamically  significant stenosis of the distal right vertebral artery, but there is up severe tandem stenosis of the left vertebral artery V4 segment (series 7, image 151) in the left PICA region and at the vertebrobasilar junction. The basilar remains patent. Both AICA origins appear patent. No basilar stenosis. Mild basilar regularity. Normal SCA and PCA origins. Posterior communicating arteries are diminutive or absent. Mild right PCA irregularity. Severe left PCA P2 segment stenosis best seen on series 10, image 23 with preserved distal enhancement. Anterior circulation: Both ICA siphons are patent, but there is severe calcified siphon atherosclerosis. Still, there is no definite siphon hemodynamically significant stenosis. Normal ophthalmic artery origins. Patent carotid termini. Patent ACA origins. Mild irregularity and stenosis left ACA A1 segment. Anterior communicating artery and bilateral ACA branches are within normal limits. Left MCA origin and M1 segment are normal. Left MCA bifurcation is patent. Mild irregularity of the left MCA M3 branches. Right MCA origin is patent without stenosis, but there is moderate to severe stenosis in the mid right M1 segment and again at the right MCA bifurcation (series 16, image 26). No right discrete MCA branch occlusion is identified although there are is a paucity of enhancing anterior division right M2 branches (series 12, image 12). Venous sinuses: Patent. Anatomic variants: None. Delayed phase: Mild progression of confluent right basal ganglia hypodensity since the head CT yesterday. No associated hemorrhage or mass effect. Otherwise stable gray-white matter differentiation. No superimposed acute cortically based infarct identified. No abnormal enhancement identified. Review of the MIP images confirms the above findings IMPRESSION: 1. Severe atherosclerosis. Negative for emergent large vessel occlusion. 2. Pseudoaneurysm of the proximal Brachiocephalic Artery (see series 8,  image 121). Recommend Vascular Surgery consultation. 3. 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 No discrete right MCA branch occlusion identified although there is a paucity of enhancing anterior division M2/M3 branches. 4. Extensive Right carotid atherosclerosis without hemodynamically significant stenosis. 5. High-grade RADIOGRAPHIC STRING SIGN STENOSIS of the Left ICA origin due to bulky calcified plaque. 6. 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 7. Severe stenosis Left PCA P2 segment. 8. Expected evolution on CT of the acute right basal ganglia infarcts. No associated hemorrhage or mass effect. 9. Emphysema. Electronically Signed   By: Genevie Ann M.D.   On: 10/28/2016 13:00   Dg Chest 1 View  Result Date: 10/28/2016 CLINICAL DATA:  CVA with left-sided weakness. The patient ports nausea and vomiting. History of diabetes, hypertension, previous CP 80. EXAM: CHEST 1 VIEW COMPARISON:  Chest x-ray of October 12, 2014 FINDINGS: The lungs are less well inflated today. There is patchy density in the right perihilar region. There is no alveolar infiltrate. There is no pleural effusion. The cardiac silhouette is enlarged. There is some crowding of the central pulmonary vascularity. The patient has undergone previous CABG. There is calcification in the wall of the thoracic aorta. The observed bony structures exhibit no acute abnormalities. IMPRESSION: Mild hypo inflation accentuates the lung markings.  However, there is likely low-grade CHF with mild interstitial edema. Mildly increased density in the right perihilar region may reflect subsegmental atelectasis. When the patient can tolerate the procedure, a PA and lateral chest x-ray with better inspiratory effort would be useful. Thoracic aortic atherosclerosis. Electronically Signed   By: David  Martinique M.D.   On: 10/28/2016 11:46   Ct Head Wo Contrast  Result Date:  10/27/2016 CLINICAL DATA:  Left-sided weakness.  Multiple recent falls. EXAM: CT HEAD WITHOUT CONTRAST TECHNIQUE: Contiguous axial images were obtained from the base of the skull through the vertex without intravenous contrast. COMPARISON:  01/25/2016 FINDINGS: Brain: Negative for acute intracranial hemorrhage or extra-axial fluid collection. There is focal hypodensity in the right caudate extending into the right lentiform nuclei, likely subacute infarction. No significant mass effect. Unchanged ventriculomegaly. Remote left corona radiata infarction, unchanged. Probable remote cerebellar infarctions, unchanged. Vascular: No hyperdense vessel or unexpected calcification. Skull: Normal. Negative for fracture or focal lesion. Sinuses/Orbits: No acute finding. IMPRESSION: 1. New focal hypodensity measuring approximately 2.5 cm in the right caudate, extending into the right lentiform nuclei, likely subacute infarction. No mass effect or midline shift. Recommend brain MRI to characterize. 2. Unchanged ventriculomegaly and remote infarctions involving the left corona radiata and both cerebellar hemispheres. Electronically Signed   By: Andreas Newport M.D.   On: 10/27/2016 23:17   Ct Angio Neck W Or Wo Contrast  Result Date: 10/28/2016 CLINICAL DATA:  75 year old male with right lenticulostriate territory acute infarcts on MRI performed for a left side weakness and fall. Initial encounter. EXAM: CT ANGIOGRAPHY HEAD AND NECK TECHNIQUE: Multidetector CT imaging of the head and neck was performed using the standard protocol during bolus administration of intravenous contrast. Multiplanar CT image reconstructions and MIPs were obtained to evaluate the vascular anatomy. Carotid stenosis measurements (when applicable) are obtained utilizing NASCET criteria, using the distal internal carotid diameter as the denominator. CONTRAST:  50 mL Isovue 370 COMPARISON:  Brain MRI 0956 hours today and head CT 10/27/2016. FINDINGS:  CTA NECK Skeleton: Prior median sternotomy. No acute osseous abnormality identified. Visualized paranasal sinuses and mastoids are stable and well pneumatized. Upper chest: Centrilobular emphysema. Mild nonspecific dependent pulmonary opacity. No superior mediastinal lymphadenopathy. Other neck: Negative thyroid, larynx, pharynx, parapharyngeal spaces, retropharyngeal space, sublingual space, submandibular glands and parotid glands. No cervical lymphadenopathy. Negative orbit and scalp soft tissues. Aortic arch: Sequelae of CABG. Extensive arch calcified atherosclerosis. Three vessel arch configuration. Right carotid system: 7 mm diameter pseudoaneurysm along the proximal (right lateral) aspect of the brachia a cephalic artery as seen on series 8, image 121 and series 5, image 162. No associated stenosis of the proximal brachiocephalic. Soft and calcified plaque of the brachiocephalic artery. No right CCA origin despite soft plaque. Soft and calcified plaque intermittently in the right CCA without stenosis proximal to the right carotid bifurcation. At the bifurcation there is calcified plaque with less than 50 % stenosis with respect to the distal vessel of the proximal right ICA. Calcified plaque in the distal cervical right ICA just below the skullbase also without stenosis. Left carotid system: No left CCA origin stenosis. Intermittent soft and calcified plaque in the left CCA without stenosis proximal to the bifurcation. At the left carotid bifurcation there is bulky in confluent calcified plaque resulting in a Radiographic String Sign Stenosis of the left ICA origin as seen on series 7, image 215 and series 8, image 102. Calcified plaque continues into the left ICA bulb. Negative cervical left ICA otherwise. Vertebral  arteries:Soft and calcified plaque in the proximal right subclavian artery without stenosis. Soft plaque at the right vertebral artery origin without hemodynamically significant stenosis (series  7, image 298). Calcified plaque in the right V1 segment, and at the skullbase without associated stenosis. Soft and calcified plaque in the proximal left subclavian artery without hemodynamically significant stenosis. Soft plaque at the left vertebral artery origin without stenosis (series 8, image 146). Intermittent soft and calcified plaque in the left V2 segment with up to mild stenosis. CTA HEAD Posterior circulation: Bilateral distal vertebral artery calcified plaque. No hemodynamically significant stenosis of the distal right vertebral artery, but there is up severe tandem stenosis of the left vertebral artery V4 segment (series 7, image 151) in the left PICA region and at the vertebrobasilar junction. The basilar remains patent. Both AICA origins appear patent. No basilar stenosis. Mild basilar regularity. Normal SCA and PCA origins. Posterior communicating arteries are diminutive or absent. Mild right PCA irregularity. Severe left PCA P2 segment stenosis best seen on series 10, image 23 with preserved distal enhancement. Anterior circulation: Both ICA siphons are patent, but there is severe calcified siphon atherosclerosis. Still, there is no definite siphon hemodynamically significant stenosis. Normal ophthalmic artery origins. Patent carotid termini. Patent ACA origins. Mild irregularity and stenosis left ACA A1 segment. Anterior communicating artery and bilateral ACA branches are within normal limits. Left MCA origin and M1 segment are normal. Left MCA bifurcation is patent. Mild irregularity of the left MCA M3 branches. Right MCA origin is patent without stenosis, but there is moderate to severe stenosis in the mid right M1 segment and again at the right MCA bifurcation (series 16, image 26). No right discrete MCA branch occlusion is identified although there are is a paucity of enhancing anterior division right M2 branches (series 12, image 12). Venous sinuses: Patent. Anatomic variants: None. Delayed  phase: Mild progression of confluent right basal ganglia hypodensity since the head CT yesterday. No associated hemorrhage or mass effect. Otherwise stable gray-white matter differentiation. No superimposed acute cortically based infarct identified. No abnormal enhancement identified. Review of the MIP images confirms the above findings IMPRESSION: 1. Severe atherosclerosis. Negative for emergent large vessel occlusion. 2. Pseudoaneurysm of the proximal Brachiocephalic Artery (see series 8, image 121). Recommend Vascular Surgery consultation. 3. 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 No discrete right MCA branch occlusion identified although there is a paucity of enhancing anterior division M2/M3 branches. 4. Extensive Right carotid atherosclerosis without hemodynamically significant stenosis. 5. High-grade RADIOGRAPHIC STRING SIGN STENOSIS of the Left ICA origin due to bulky calcified plaque. 6. 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 7. Severe stenosis Left PCA P2 segment. 8. Expected evolution on CT of the acute right basal ganglia infarcts. No associated hemorrhage or mass effect. 9. Emphysema. Electronically Signed   By: Genevie Ann M.D.   On: 10/28/2016 13:00   Mr Brain Wo Contrast  Result Date: 10/28/2016 CLINICAL DATA:  Left-sided weakness.  Fall at home. EXAM: MRI HEAD WITHOUT CONTRAST TECHNIQUE: Multiplanar, multiecho pulse sequences of the brain and surrounding structures were obtained without intravenous contrast. COMPARISON:  Head CT from yesterday FINDINGS: Brain: Restricted diffusion in a wedge-shaped area extending from the right anterior putamen through the anterior limb internal capsule to the caudate head. Distinct second area of acute infarct or posteriorly with the same morphology, extending from putamen to caudate body. No acute hemorrhage. Chronic microvascular disease with  moderate ischemic gliosis  throughout cerebral white matter. Small remote left frontal infarct at the vertex. There is lateral and third ventriculomegaly that is likely from atrophy. No temporal horn dilatation typical of an obstructive for communicating hydrocephalus. Vascular: Preserved flow voids.  No focal marrow lesion. Skull and upper cervical spine: Negative Sinuses/Orbits: Negative IMPRESSION: 1. Two acute infarcts in the right lateral lenticulostriate distribution. 2. Chronic microvascular disease and small remote left frontal infarct. 3. Lateral and third ventriculomegaly, favor atrophy over communicating hydrocephalus. Electronically Signed   By: Monte Fantasia M.D.   On: 10/28/2016 11:24     Labs:   Basic Metabolic Panel:  Recent Labs Lab 10/27/16 2110 10/27/16 2118 10/28/16 0146 10/28/16 1147 10/30/16 0815  NA 137 138  --  138 141  K 3.1* 3.0*  --  3.1* 3.5  CL 101 100*  --  101 104  CO2 25  --   --  26 29  GLUCOSE 121* 126*  --  143* 127*  BUN 11 13  --  11 12  CREATININE 0.94 1.00  --  1.07 0.96  CALCIUM 9.4  --   --  9.2 8.8*  MG  --   --  2.0  --   --    GFR Estimated Creatinine Clearance: 74.3 mL/min (by C-G formula based on SCr of 0.96 mg/dL). Liver Function Tests:  Recent Labs Lab 10/27/16 2110  AST 47*  ALT 32  ALKPHOS 27*  BILITOT 0.7  PROT 7.0  ALBUMIN 3.6   No results for input(s): LIPASE, AMYLASE in the last 168 hours. No results for input(s): AMMONIA in the last 168 hours. Coagulation profile  Recent Labs Lab 10/27/16 2110  INR 0.99    CBC:  Recent Labs Lab 10/27/16 2110 10/27/16 2118  WBC 7.7  --   NEUTROABS 5.0  --   HGB 14.6 13.6  HCT 41.1 40.0  MCV 94.5  --   PLT 238  --    Cardiac Enzymes: No results for input(s): CKTOTAL, CKMB, CKMBINDEX, TROPONINI in the last 168 hours. BNP: Invalid input(s): POCBNP CBG:  Recent Labs Lab 10/29/16 0758 10/29/16 1122 10/29/16 1609 10/29/16 2203 10/30/16 0819  GLUCAP 120* 141* 122* 113* 132*    D-Dimer No results for input(s): DDIMER in the last 72 hours. Hgb A1c  Recent Labs  10/28/16 0146  HGBA1C 5.9*   Lipid Profile  Recent Labs  10/28/16 0146  CHOL 251*  HDL 43  LDLCALC 180*  TRIG 139  CHOLHDL 5.8   Thyroid function studies No results for input(s): TSH, T4TOTAL, T3FREE, THYROIDAB in the last 72 hours.  Invalid input(s): FREET3 Anemia work up No results for input(s): VITAMINB12, FOLATE, FERRITIN, TIBC, IRON, RETICCTPCT in the last 72 hours. Microbiology No results found for this or any previous visit (from the past 240 hour(s)).   Discharge Instructions:   Discharge Instructions    Ambulatory referral to Neurology    Complete by:  As directed    Pt will follow up with Dr. Erlinda Hong at [facility] in about 2 months. Thanks.   Discharge instructions    Complete by:  As directed    DYS 2 honey thick diet 30 day event monitor for a fib monitoring Aspiration precuations   Increase activity slowly    Complete by:  As directed        Medication List    STOP taking these medications   chlorthalidone 25 MG tablet Commonly known as:  HYGROTON   doxylamine (Sleep) 25  MG tablet Commonly known as:  UNISOM   EFFIENT 10 MG Tabs tablet Generic drug:  prasugrel   metFORMIN 500 MG tablet Commonly known as:  GLUCOPHAGE   nitroGLYCERIN 0.4 MG SL tablet Commonly known as:  NITROSTAT   rosuvastatin 10 MG tablet Commonly known as:  CRESTOR     TAKE these medications   acetaminophen 500 MG tablet Commonly known as:  TYLENOL Take 1,000 mg by mouth every 6 (six) hours as needed for mild pain.   amLODipine 5 MG tablet Commonly known as:  NORVASC Take 1 tablet by mouth  daily   aspirin 325 MG tablet Take 1 tablet (325 mg total) by mouth daily. Start taking on:  10/31/2016 What changed:  medication strength  how much to take   atorvastatin 80 MG tablet Commonly known as:  LIPITOR Take 1 tablet (80 mg total) by mouth daily at 6 PM. What  changed:  medication strength  how much to take  when to take this   CALCIUM-CARB 600 + D 600-125 MG-UNIT Tabs Generic drug:  Calcium Carbonate-Vitamin D Take 1 tablet by mouth daily.   cloNIDine 0.1 MG tablet Commonly known as:  CATAPRES TAKE 1 TABLET AS NEEDED FOR BLOOD PRESSURES GREATER THAN 160/100   clopidogrel 75 MG tablet Commonly known as:  PLAVIX Take 1 tablet (75 mg total) by mouth daily. Start taking on:  10/31/2016   finasteride 5 MG tablet Commonly known as:  PROSCAR Take 5 mg by mouth daily.   food thickener Powd Commonly known as:  THICK IT prn   haloperidol 1 MG tablet Commonly known as:  HALDOL Take 1 tablet (1 mg total) by mouth every 6 (six) hours as needed (hiccups).   insulin aspart 100 UNIT/ML injection Commonly known as:  novoLOG Inject 0-9 Units into the skin 3 (three) times daily with meals.   lisinopril 20 MG tablet Commonly known as:  PRINIVIL,ZESTRIL TAKE 1 TABLET (20 MG TOTAL) BY MOUTH DAILY.   metoprolol succinate 50 MG 24 hr tablet Commonly known as:  TOPROL-XL TAKE 1 TABLET BY MOUTH DAILY WITH OR IMMEDIATELY FOLLOWING A MEAL   multivitamin tablet Take 1 tablet by mouth daily.   ranitidine 75 MG tablet Commonly known as:  ZANTAC Take 75-150 mg by mouth See admin instructions. 75 mg in the am, 150 mg at bedtime   RESTORA Caps Take 1 capsule by mouth daily.   tamsulosin 0.4 MG Caps capsule Commonly known as:  FLOMAX Take 0.8 mg by mouth daily.      Follow-up Information    Xu,Jindong, MD. Schedule an appointment as soon as possible for a visit in 6 week(s).   Specialty:  Neurology Contact information: [address] [place] [address] [phone]        Wardell Honour, MD Follow up in 1 week(s).   Specialty:  Family Medicine Contact information: [place] [id number] [phone]            Time coordinating discharge: 35 min  Signed:  JESSICA Alison Stalling   [facility] 10/30/2016, 10:41 AM

## 2016-10-30 NOTE — Progress Notes (Signed)
Patient ID: [name] Cook, male   DOB: 08-Oct-1941, 75 y.o.   MRN: [id number] Rehabilitation evaluation noted. 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 Will not follow actively. Does have severe asymptomatic left internal carotid artery stenosis. We'll see him again in the office in approximately 2 months. Questionable benefit of left endarterectomy since asymptomatic

## 2016-10-30 NOTE — Progress Notes (Signed)
Patient admitted to [facility] room [place]. Patient's wife, and 2 sons at bedside. Given information packet, and oriented to CIR schedule. All questions answered. Noted patient with continued hiccups, previous nurse administered Haldol at 1648 pm.  Notified Pam Love,PA given order for scheduled Baclofen 5 mg TID. Will continue to monitor patient. [place]

## 2016-10-30 NOTE — Care Management Note (Signed)
Case Management Note  Patient Details  Name: [name] Cook MRN: [id number] Date of Birth: 07-Mar-1941  Subjective/Objective:                    Action/Plan: Pt discharging to CIR today. No further needs per CM.  Expected Discharge Date:                  Expected Discharge Plan:  [place]  In-House Referral:     Discharge planning Services     Post Acute Care Choice:    Choice offered to:     DME Arranged:    DME Agency:     HH Arranged:    [place] Agency:     Status of Service:  Completed, signed off  If discussed at [employer] of Stay Meetings, dates discussed:    Additional Comments:  Pollie Friar, RN 10/30/2016, 2:06 PM

## 2016-10-30 NOTE — Clinical Social Work Note (Signed)
CSW consulted for New SNF. CSW was notified by [facility] that pt will discharge today to CIR. CSW is signing off as no further needs identified.   Darden Dates, MSW, LCSW  Clinical Social Worker  [phone]

## 2016-10-30 NOTE — Progress Notes (Signed)
Rehab admissions - I met with patient, wife and son from [facility] at the bedside.  Family aware and understand that patient will need additional caregiver support at home after rehab.  Wife has been talking to a friend, Iona Beard, who lives 5 mins away who can assist.  Also has a son 20 mins away, a son 4 hrs away who can help weekends, a dtr-in-law locally who is a CNA, and some friends of the son who are local.  I have encouraged wife/family to develop a caregiver schedule/plan so that she will have adequate support after rehab.  Wife and family strongly in favor of inpatient rehab.  Patient medically cleared by attending MD today.  Bed available and will admit to acute inpatient rehab today.  Call me for questions.  #[phone]

## 2016-10-30 NOTE — Progress Notes (Signed)
Ankit Lorie Phenix, MD Physician Signed Physical Medicine and Rehabilitation  Consult Note Date of Service: 10/29/2016 5:43 AM  Related encounter: ED to Hosp-Admission (Current) from 10/27/2016 in [place] All Collapse All   [] Hide copied text [] Hover for attribution information      Physical Medicine and Rehabilitation Consult Reason for Consult: Right basal ganglia infarct Referring Physician: [facility]   HPI: [name] Cook is a 75 y.o. 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 Per chart review patient lives with spouse. Use wheelchair for primary locomotion and did have a prosthesis for right BKA. Presented 10/27/2016 with fall and reported left-sided weakness and slurred speech. Denied loss of consciousness. No recent headaches reported. CT/MRI showed 2 acute infarcts in the right lateral lenticulostriate distribution. CT angiogram head and neck negative for emergent large vessel occlusion. Pseudoaneurysm of the proximal brachiocephalic artery. Moderate to severe tandem stenosis of the right MCA mid M1 segment and at the right MCA bifurcation as well as high-grade radiographic string sign stenosis of the left ICA. Patient did not receive TPA. Echocardiogram with ejection fraction of [id number] grade 2 diastolic dysfunction. Vascular surgery consulted as well as plan neuro interventional radiology to consider formal cerebral arteriography for further evaluation of innominate artery pseudoaneurysm. Neurology consulted presently on aspirin for CVA prophylaxis. Subcutaneous Lovenox for DVT prophylaxis. Hospital course hypokalemia with supplement added. Physical therapy evaluation completed with recommendations of physical medicine rehabilitation consult.   Review of Systems  Constitutional: Negative for chills and fever.  HENT:  Negative for hearing loss and tinnitus.   Eyes: Negative for blurred vision and double vision.  Respiratory: Negative for cough and shortness of breath.   Cardiovascular: Negative for chest pain.  Gastrointestinal: Positive for constipation. Negative for nausea and vomiting.       GERD Hiccups  Genitourinary: Positive for urgency. Negative for dysuria and hematuria.  Skin: Negative for rash.  Neurological: Positive for speech change, focal weakness and weakness. Negative for tremors, sensory change, seizures and loss of consciousness.  All other systems reviewed and are negative.      Past Medical History:  Diagnosis Date  . Arthritis   . CAD (coronary artery disease)    2014 75% left main stenosis, LAD 80% followed by a mid subtotal stenosis, circumflex 90% stenosis, right coronary artery occluded, SVG to RCA occluded, SVG ramus occluded, SVG to OM patent without a sequential anastomosis noted. Graft had a focal 99% stenosis which was stented. 3.5 x 16 mm premier. b. cath 10/13/2014 DES to SVG to OM overlap previous stent  . Cholelithiasis 2014  . Cogan syndrome   . Colon polyps   . Diabetes mellitus without complication ([place])   . Dyslipidemia   . Gallstones   . GERD (gastroesophageal reflux disease)   . Hepatic hemangioma 2014  . Hx of right BKA ([place])    a. 2/2 necrotizing fasciitis.  . Hypertension   . Kidney stones   . Myocardial infarction 1999  . Necrotizing fasciitis ([place])   . Peptic ulcer disease   . Peripheral vascular disease [facility])         Past Surgical History:  Procedure Laterality Date  . APPENDECTOMY  2007  . CORONARY ARTERY BYPASS GRAFT  1999   LIMA to the LAD, saphenous vein graft to obtuse marginal 1 and obtuse marginal 2, spahenous vein  graft to intermediate, spahenous vein graft to the right coronary artery in 1999  . LEFT HEART CATHETERIZATION WITH CORONARY ANGIOGRAM N/A 10/13/2014   Procedure: LEFT HEART CATHETERIZATION WITH  CORONARY ANGIOGRAM;  Surgeon: Peter M Martinique, MD;  Location: [facility] CATH LAB;  Service: Cardiovascular;  Laterality: N/A;  . LEFT HEART CATHETERIZATION WITH CORONARY/GRAFT ANGIOGRAM N/A 07/19/2013   Procedure: LEFT HEART CATHETERIZATION WITH Beatrix Fetters;  Surgeon: Peter M Martinique, MD;  Location: [facility] CATH LAB;  Service: Cardiovascular;  Laterality: N/A;  . LEG AMPUTATION  1999   Right BKA  . PERCUTANEOUS CORONARY STENT INTERVENTION (PCI-S)  07/19/2013   Procedure: PERCUTANEOUS CORONARY STENT INTERVENTION (PCI-S);  Surgeon: Peter M Martinique, MD;  Location: [facility] CATH LAB;  Service: Cardiovascular;;         Family History  Problem Relation Age of Onset  . Hyperlipidemia Mother   . Hypertension Mother   . Other Mother     varicose veins  . Heart disease Mother   . Rheum arthritis Father   . Heart attack Father   . Heart disease Maternal Grandfather   . Diabetes Sister   . Colon cancer Neg Hx    Social History:  reports that he quit smoking about 18 years ago. His smoking use included Cigarettes. He started smoking about 53 years ago. He has never used smokeless tobacco. He reports that he drinks about 8.4 oz of alcohol per week . He reports that he does not use drugs. Allergies:       Allergies  Allergen Reactions  . Altace [Ramipril] Other (See Comments)    hypotension         Medications Prior to Admission  Medication Sig Dispense Refill  . acetaminophen (TYLENOL) 500 MG tablet Take 1,000 mg by mouth every 6 (six) hours as needed for mild pain.     Marland Kitchen amLODipine (NORVASC) 5 MG tablet Take 1 tablet by mouth  daily 90 tablet 3  . aspirin 81 MG tablet Take 81 mg by mouth daily.      Marland Kitchen atorvastatin (LIPITOR) 40 MG tablet Take 1 tablet (40 mg total) by mouth daily. 90 tablet 3  . Calcium Carbonate-Vitamin D (CALCIUM-CARB 600 + D) 600-125 MG-UNIT TABS Take 1 tablet by mouth daily.      . chlorthalidone (HYGROTON) 25 MG tablet TAKE 1 TABLET (25 MG TOTAL) BY  MOUTH DAILY. 90 tablet 1  . cloNIDine (CATAPRES) 0.1 MG tablet TAKE 1 TABLET AS NEEDED FOR BLOOD PRESSURES GREATER THAN 160/100 90 tablet 1  . doxylamine, Sleep, (UNISOM) 25 MG tablet Take 25 mg by mouth at bedtime.    . finasteride (PROSCAR) 5 MG tablet Take 5 mg by mouth daily.    Marland Kitchen lisinopril (PRINIVIL,ZESTRIL) 20 MG tablet TAKE 1 TABLET (20 MG TOTAL) BY MOUTH DAILY. 90 tablet 1  . metoprolol succinate (TOPROL-XL) 50 MG 24 hr tablet TAKE 1 TABLET BY MOUTH DAILY WITH OR IMMEDIATELY FOLLOWING A MEAL 90 tablet 1  . Multiple Vitamin (MULTIVITAMIN) tablet Take 1 tablet by mouth daily.      . nitroGLYCERIN (NITROSTAT) 0.4 MG SL tablet Place 1 tablet (0.4 mg total) under the tongue every 5 (five) minutes as needed for chest pain. 25 tablet 3  . Probiotic Product (RESTORA) CAPS Take 1 capsule by mouth daily. 30 capsule 6  . ranitidine (ZANTAC) 75 MG tablet Take 75-150 mg by mouth See admin instructions. 75 mg in the am, 150 mg at bedtime    . tamsulosin (FLOMAX) 0.4 MG CAPS  Take 0.8 mg by mouth daily.     Marland Kitchen EFFIENT 10 MG TABS tablet Take 1 tablet by mouth  daily (Patient not taking: Reported on 10/27/2016) 90 tablet 2  . metFORMIN (GLUCOPHAGE) 500 MG tablet Take 1 tablet by mouth  daily (Patient not taking: Reported on 10/27/2016) 90 tablet 3  . rosuvastatin (CRESTOR) 10 MG tablet Take 1 tablet (10 mg total) by mouth daily. (Patient not taking: Reported on 10/27/2016) 90 tablet 3    Home: [place] expects to be discharged to:: Private residence Living Arrangements: Spouse/significant other Type of Home: House Home Equipment: Wheelchair - manual  Lives With: Spouse  Functional History: Prior Function Level of Independence: Needs assistance Gait / Transfers Assistance Needed: used w/c for primary locomotion; w/c did not fit into bathroom, he installed grab bars and used prosthesis to walk in/out Functional Status:  Mobility: Bed Mobility Overal bed mobility: Needs  Assistance, +2 for physical assistance Bed Mobility: Rolling, Sidelying to Sit, Sit to Supine Rolling: Total assist, +2 for physical assistance (Rt) Sidelying to sit: Max assist, +2 for physical assistance, HOB elevated, +2 for safety/equipment Sit to supine: Max assist, +2 for physical assistance, +2 for safety/equipment General bed mobility comments: rolled to Rt x 2; second attempt pt able to reach for his left elbow and bring LUE partially across body; using RUE on bedrail; pt assisted with directing his torso as returning to supine Transfers Overall transfer level: Needs assistance Equipment used: None Transfers: Sit to/from Stand General transfer comment: attempted from elevated EOB x 2 with 2 person moderate assist and only able to clear his hips ~4" off mattress; retruned to sit EOB      ADL:    Cognition: Cognition Overall Cognitive Status: Impaired/Different from baseline Arousal/Alertness: Awake/alert Orientation Level: Oriented X4 Attention: Sustained Sustained Attention: Impaired Sustained Attention Impairment: Verbal basic Memory: Impaired Memory Impairment: Decreased recall of new information Awareness: Impaired Awareness Impairment: Emergent impairment, Anticipatory impairment Problem Solving: Impaired Problem Solving Impairment: Verbal basic, Functional basic Safety/Judgment: Impaired Cognition Arousal/Alertness: Awake/alert Behavior During Therapy: Impulsive Overall Cognitive Status: Impaired/Different from baseline Area of Impairment: Attention, Following commands, Safety/judgement, Awareness, Problem solving Current Attention Level: Sustained Following Commands: Follows one step commands with increased time Safety/Judgement: Decreased awareness of safety, Decreased awareness of deficits Awareness: Intellectual Problem Solving: Requires verbal cues, Requires tactile cues General Comments: Patient with head turned to his right unless cued to look to his  left (which he could then do). He neglected LUE and several times required prompt to "find his lt arm."  Blood pressure (!) 171/68, pulse 81, temperature 98.8 F (37.1 C), temperature source Axillary, resp. rate 20, height 5\' 8"  (1.727 m), weight 92 kg (202 lb 12.8 oz), SpO2 95 %. Physical Exam  Vitals reviewed. Constitutional: He appears well-developed and well-nourished.  HENT:  Head: Normocephalic and atraumatic.  Eyes: Conjunctivae and EOM are normal.  Neck: Normal range of motion. Neck supple. No thyromegaly present.  Cardiovascular: Normal rate and regular rhythm.   Respiratory: No respiratory distress.  Upper airway congestion with limited inspiratory effort  GI: Soft. Bowel sounds are normal. He exhibits no distension. There is no tenderness.  Musculoskeletal: He exhibits no edema or tenderness.  Right BKA  Neurological: He is alert.  Patient is able to provide his name and age.  Speech is a bit dysarthric but intelligible.  Follows simple commands Left facial weakness Sensation intact to light touch throughout Motor: LUE/LLE: 0/5 RUE: 5/5 proximal to distal RLE: HF  5/5  Skin: Skin is warm and dry.  Psychiatric: He has a normal mood and affect. His behavior is normal.    Lab Results Last 24 Hours       Results for orders placed or performed during the hospital encounter of 10/27/16 (from the past 24 hour(s))  Basic metabolic panel     Status: Abnormal   Collection Time: 10/28/16 11:47 AM  Result Value Ref Range   Sodium 138 135 - 145 mmol/L   Potassium 3.1 (L) 3.5 - 5.1 mmol/L   Chloride 101 101 - 111 mmol/L   CO2 26 22 - 32 mmol/L   Glucose, Bld 143 (H) 65 - 99 mg/dL   BUN 11 6 - 20 mg/dL   Creatinine, Ser 1.07 0.61 - 1.24 mg/dL   Calcium 9.2 8.9 - 10.3 mg/dL   GFR calc non Af Amer >60 >60 mL/min   GFR calc Af Amer >60 >60 mL/min   Anion gap 11 5 - 15  Glucose, capillary     Status: Abnormal   Collection Time: 10/28/16  1:19 PM  Result Value  Ref Range   Glucose-Capillary 127 (H) 65 - 99 mg/dL  Glucose, capillary     Status: Abnormal   Collection Time: 10/28/16  4:45 PM  Result Value Ref Range   Glucose-Capillary 122 (H) 65 - 99 mg/dL  Urine rapid drug screen (hosp performed)not at [facility]     Status: Abnormal   Collection Time: 10/28/16  5:44 PM  Result Value Ref Range   Opiates POSITIVE (A) NONE DETECTED   Cocaine NONE DETECTED NONE DETECTED   Benzodiazepines NONE DETECTED NONE DETECTED   Amphetamines NONE DETECTED NONE DETECTED   Tetrahydrocannabinol NONE DETECTED NONE DETECTED   Barbiturates NONE DETECTED NONE DETECTED  Urinalysis, Routine w reflex microscopic (not at [facility])     Status: Abnormal   Collection Time: 10/28/16  5:44 PM  Result Value Ref Range   Color, Urine YELLOW YELLOW   APPearance CLEAR CLEAR   Specific Gravity, Urine >1.046 (H) 1.005 - 1.030   pH 6.0 5.0 - 8.0   Glucose, UA NEGATIVE NEGATIVE mg/dL   Hgb urine dipstick NEGATIVE NEGATIVE   Bilirubin Urine NEGATIVE NEGATIVE   Ketones, ur 15 (A) NEGATIVE mg/dL   Protein, ur 30 (A) NEGATIVE mg/dL   Nitrite NEGATIVE NEGATIVE   Leukocytes, UA NEGATIVE NEGATIVE  Urine microscopic-add on     Status: Abnormal   Collection Time: 10/28/16  5:44 PM  Result Value Ref Range   Squamous Epithelial / LPF 0-5 (A) NONE SEEN   WBC, UA 0-5 0 - 5 WBC/hpf   RBC / HPF 0-5 0 - 5 RBC/hpf   Bacteria, UA RARE (A) NONE SEEN   Casts HYALINE CASTS (A) NEGATIVE   Urine-Other MUCOUS PRESENT   Glucose, capillary     Status: Abnormal   Collection Time: 10/28/16  8:43 PM  Result Value Ref Range   Glucose-Capillary 120 (H) 65 - 99 mg/dL   Comment 1 Notify RN    Comment 2 Document in Chart   Glucose, capillary     Status: None   Collection Time: 10/29/16 12:08 AM  Result Value Ref Range   Glucose-Capillary 99 65 - 99 mg/dL   Comment 1 Notify RN    Comment 2 Document in Chart   Glucose, capillary     Status: Abnormal   Collection  Time: 10/29/16  4:26 AM  Result Value Ref Range   Glucose-Capillary 109 (H) 65 - 99 mg/dL  Comment 1 Notify RN    Comment 2 Document in Chart       Imaging Results (Last 48 hours)  Ct Angio Head W Or Wo Contrast  Result Date: 10/28/2016 CLINICAL DATA:  75 year old male with right lenticulostriate territory acute infarcts on MRI performed for a left side weakness and fall. Initial encounter. EXAM: CT ANGIOGRAPHY HEAD AND NECK TECHNIQUE: Multidetector CT imaging of the head and neck was performed using the standard protocol during bolus administration of intravenous contrast. Multiplanar CT image reconstructions and MIPs were obtained to evaluate the vascular anatomy. Carotid stenosis measurements (when applicable) are obtained utilizing NASCET criteria, using the distal internal carotid diameter as the denominator. CONTRAST:  50 mL Isovue 370 COMPARISON:  Brain MRI 0956 hours today and head CT 10/27/2016. FINDINGS: CTA NECK Skeleton: Prior median sternotomy. No acute osseous abnormality identified. Visualized paranasal sinuses and mastoids are stable and well pneumatized. Upper chest: Centrilobular emphysema. Mild nonspecific dependent pulmonary opacity. No superior mediastinal lymphadenopathy. Other neck: Negative thyroid, larynx, pharynx, parapharyngeal spaces, retropharyngeal space, sublingual space, submandibular glands and parotid glands. No cervical lymphadenopathy. Negative orbit and scalp soft tissues. Aortic arch: Sequelae of CABG. Extensive arch calcified atherosclerosis. Three vessel arch configuration. Right carotid system: 7 mm diameter pseudoaneurysm along the proximal (right lateral) aspect of the brachia a cephalic artery as seen on series 8, image 121 and series 5, image 162. No associated stenosis of the proximal brachiocephalic. Soft and calcified plaque of the brachiocephalic artery. No right CCA origin despite soft plaque. Soft and calcified plaque intermittently in the right  CCA without stenosis proximal to the right carotid bifurcation. At the bifurcation there is calcified plaque with less than 50 % stenosis with respect to the distal vessel of the proximal right ICA. Calcified plaque in the distal cervical right ICA just below the skullbase also without stenosis. Left carotid system: No left CCA origin stenosis. Intermittent soft and calcified plaque in the left CCA without stenosis proximal to the bifurcation. At the left carotid bifurcation there is bulky in confluent calcified plaque resulting in a Radiographic String Sign Stenosis of the left ICA origin as seen on series 7, image 215 and series 8, image 102. Calcified plaque continues into the left ICA bulb. Negative cervical left ICA otherwise. Vertebral arteries:Soft and calcified plaque in the proximal right subclavian artery without stenosis. Soft plaque at the right vertebral artery origin without hemodynamically significant stenosis (series 7, image 298). Calcified plaque in the right V1 segment, and at the skullbase without associated stenosis. Soft and calcified plaque in the proximal left subclavian artery without hemodynamically significant stenosis. Soft plaque at the left vertebral artery origin without stenosis (series 8, image 146). Intermittent soft and calcified plaque in the left V2 segment with up to mild stenosis. CTA HEAD Posterior circulation: Bilateral distal vertebral artery calcified plaque. No hemodynamically significant stenosis of the distal right vertebral artery, but there is up severe tandem stenosis of the left vertebral artery V4 segment (series 7, image 151) in the left PICA region and at the vertebrobasilar junction. The basilar remains patent. Both AICA origins appear patent. No basilar stenosis. Mild basilar regularity. Normal SCA and PCA origins. Posterior communicating arteries are diminutive or absent. Mild right PCA irregularity. Severe left PCA P2 segment stenosis best seen on series 10,  image 23 with preserved distal enhancement. Anterior circulation: Both ICA siphons are patent, but there is severe calcified siphon atherosclerosis. Still, there is no definite siphon hemodynamically significant stenosis. Normal ophthalmic artery origins.  Patent carotid termini. Patent ACA origins. Mild irregularity and stenosis left ACA A1 segment. Anterior communicating artery and bilateral ACA branches are within normal limits. Left MCA origin and M1 segment are normal. Left MCA bifurcation is patent. Mild irregularity of the left MCA M3 branches. Right MCA origin is patent without stenosis, but there is moderate to severe stenosis in the mid right M1 segment and again at the right MCA bifurcation (series 16, image 26). No right discrete MCA branch occlusion is identified although there are is a paucity of enhancing anterior division right M2 branches (series 12, image 12). Venous sinuses: Patent. Anatomic variants: None. Delayed phase: Mild progression of confluent right basal ganglia hypodensity since the head CT yesterday. No associated hemorrhage or mass effect. Otherwise stable gray-white matter differentiation. No superimposed acute cortically based infarct identified. No abnormal enhancement identified. Review of the MIP images confirms the above findings IMPRESSION: 1. Severe atherosclerosis. Negative for emergent large vessel occlusion. 2. Pseudoaneurysm of the proximal Brachiocephalic Artery (see series 8, image 121). Recommend Vascular Surgery consultation. 3. 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 No discrete right MCA branch occlusion identified although there is a paucity of enhancing anterior division M2/M3 branches. 4. Extensive Right carotid atherosclerosis without hemodynamically significant stenosis. 5. High-grade RADIOGRAPHIC STRING SIGN STENOSIS of the Left ICA origin due to bulky calcified plaque. 6. Severe tandem stenoses of the  distal Left Vertebral Artery V4 segment. 7. Severe stenosis Left PCA P2 segment. 8. Expected evolution on CT of the acute right basal ganglia infarcts. No associated hemorrhage or mass effect. 9. Emphysema. Electronically Signed   By: Genevie Ann M.D.   On: 10/28/2016 13:00   Dg Chest 1 View  Result Date: 10/28/2016 CLINICAL DATA:  CVA with left-sided weakness. The patient ports nausea and vomiting. History of diabetes, hypertension, previous CP 80. EXAM: CHEST 1 VIEW COMPARISON:  Chest x-ray of October 12, 2014 FINDINGS: The lungs are less well inflated today. There is patchy density in the right perihilar region. There is no alveolar infiltrate. There is no pleural effusion. The cardiac silhouette is enlarged. There is some crowding of the central pulmonary vascularity. The patient has undergone previous CABG. There is calcification in the wall of the thoracic aorta. The observed bony structures exhibit no acute abnormalities. IMPRESSION: Mild hypo inflation accentuates the lung markings. However, there is likely low-grade CHF with mild interstitial edema. Mildly increased density in the right perihilar region may reflect subsegmental atelectasis. When the patient can tolerate the procedure, a PA and lateral chest x-ray with better inspiratory effort would be useful. Thoracic aortic atherosclerosis. Electronically Signed   By: David  Martinique M.D.   On: 10/28/2016 11:46   Ct Head Wo Contrast  Result Date: 10/27/2016 CLINICAL DATA:  Left-sided weakness.  Multiple recent falls. EXAM: CT HEAD WITHOUT CONTRAST TECHNIQUE: Contiguous axial images were obtained from the base of the skull through the vertex without intravenous contrast. COMPARISON:  01/25/2016 FINDINGS: Brain: Negative for acute intracranial hemorrhage or extra-axial fluid collection. There is focal hypodensity in the right caudate extending into the right lentiform nuclei, likely subacute infarction. No significant mass effect. Unchanged  ventriculomegaly. Remote left corona radiata infarction, unchanged. Probable remote cerebellar infarctions, unchanged. Vascular: No hyperdense vessel or unexpected calcification. Skull: Normal. Negative for fracture or focal lesion. Sinuses/Orbits: No acute finding. IMPRESSION: 1. New focal hypodensity measuring approximately 2.5 cm in the right caudate, extending into the right lentiform nuclei, likely  subacute infarction. No mass effect or midline shift. Recommend brain MRI to characterize. 2. Unchanged ventriculomegaly and remote infarctions involving the left corona radiata and both cerebellar hemispheres. Electronically Signed   By: Andreas Newport M.D.   On: 10/27/2016 23:17   Ct Angio Neck W Or Wo Contrast  Result Date: 10/28/2016 CLINICAL DATA:  75 year old male with right lenticulostriate territory acute infarcts on MRI performed for a left side weakness and fall. Initial encounter. EXAM: CT ANGIOGRAPHY HEAD AND NECK TECHNIQUE: Multidetector CT imaging of the head and neck was performed using the standard protocol during bolus administration of intravenous contrast. Multiplanar CT image reconstructions and MIPs were obtained to evaluate the vascular anatomy. Carotid stenosis measurements (when applicable) are obtained utilizing NASCET criteria, using the distal internal carotid diameter as the denominator. CONTRAST:  50 mL Isovue 370 COMPARISON:  Brain MRI 0956 hours today and head CT 10/27/2016. FINDINGS: CTA NECK Skeleton: Prior median sternotomy. No acute osseous abnormality identified. Visualized paranasal sinuses and mastoids are stable and well pneumatized. Upper chest: Centrilobular emphysema. Mild nonspecific dependent pulmonary opacity. No superior mediastinal lymphadenopathy. Other neck: Negative thyroid, larynx, pharynx, parapharyngeal spaces, retropharyngeal space, sublingual space, submandibular glands and parotid glands. No cervical lymphadenopathy. Negative orbit and scalp soft  tissues. Aortic arch: Sequelae of CABG. Extensive arch calcified atherosclerosis. Three vessel arch configuration. Right carotid system: 7 mm diameter pseudoaneurysm along the proximal (right lateral) aspect of the brachia a cephalic artery as seen on series 8, image 121 and series 5, image 162. No associated stenosis of the proximal brachiocephalic. Soft and calcified plaque of the brachiocephalic artery. No right CCA origin despite soft plaque. Soft and calcified plaque intermittently in the right CCA without stenosis proximal to the right carotid bifurcation. At the bifurcation there is calcified plaque with less than 50 % stenosis with respect to the distal vessel of the proximal right ICA. Calcified plaque in the distal cervical right ICA just below the skullbase also without stenosis. Left carotid system: No left CCA origin stenosis. Intermittent soft and calcified plaque in the left CCA without stenosis proximal to the bifurcation. At the left carotid bifurcation there is bulky in confluent calcified plaque resulting in a Radiographic String Sign Stenosis of the left ICA origin as seen on series 7, image 215 and series 8, image 102. Calcified plaque continues into the left ICA bulb. Negative cervical left ICA otherwise. Vertebral arteries:Soft and calcified plaque in the proximal right subclavian artery without stenosis. Soft plaque at the right vertebral artery origin without hemodynamically significant stenosis (series 7, image 298). Calcified plaque in the right V1 segment, and at the skullbase without associated stenosis. Soft and calcified plaque in the proximal left subclavian artery without hemodynamically significant stenosis. Soft plaque at the left vertebral artery origin without stenosis (series 8, image 146). Intermittent soft and calcified plaque in the left V2 segment with up to mild stenosis. CTA HEAD Posterior circulation: Bilateral distal vertebral artery calcified plaque. No hemodynamically  significant stenosis of the distal right vertebral artery, but there is up severe tandem stenosis of the left vertebral artery V4 segment (series 7, image 151) in the left PICA region and at the vertebrobasilar junction. The basilar remains patent. Both AICA origins appear patent. No basilar stenosis. Mild basilar regularity. Normal SCA and PCA origins. Posterior communicating arteries are diminutive or absent. Mild right PCA irregularity. Severe left PCA P2 segment stenosis best seen on series 10, image 23 with preserved distal enhancement. Anterior circulation: Both ICA siphons are patent,  but there is severe calcified siphon atherosclerosis. Still, there is no definite siphon hemodynamically significant stenosis. Normal ophthalmic artery origins. Patent carotid termini. Patent ACA origins. Mild irregularity and stenosis left ACA A1 segment. Anterior communicating artery and bilateral ACA branches are within normal limits. Left MCA origin and M1 segment are normal. Left MCA bifurcation is patent. Mild irregularity of the left MCA M3 branches. Right MCA origin is patent without stenosis, but there is moderate to severe stenosis in the mid right M1 segment and again at the right MCA bifurcation (series 16, image 26). No right discrete MCA branch occlusion is identified although there are is a paucity of enhancing anterior division right M2 branches (series 12, image 12). Venous sinuses: Patent. Anatomic variants: None. Delayed phase: Mild progression of confluent right basal ganglia hypodensity since the head CT yesterday. No associated hemorrhage or mass effect. Otherwise stable gray-white matter differentiation. No superimposed acute cortically based infarct identified. No abnormal enhancement identified. Review of the MIP images confirms the above findings IMPRESSION: 1. Severe atherosclerosis. Negative for emergent large vessel occlusion. 2. Pseudoaneurysm of the proximal Brachiocephalic Artery (see series 8,  image 121). Recommend Vascular Surgery consultation. 3. 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 No discrete right MCA branch occlusion identified although there is a paucity of enhancing anterior division M2/M3 branches. 4. Extensive Right carotid atherosclerosis without hemodynamically significant stenosis. 5. High-grade RADIOGRAPHIC STRING SIGN STENOSIS of the Left ICA origin due to bulky calcified plaque. 6. 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 7. Severe stenosis Left PCA P2 segment. 8. Expected evolution on CT of the acute right basal ganglia infarcts. No associated hemorrhage or mass effect. 9. Emphysema. Electronically Signed   By: Genevie Ann M.D.   On: 10/28/2016 13:00   Mr Brain Wo Contrast  Result Date: 10/28/2016 CLINICAL DATA:  Left-sided weakness.  Fall at home. EXAM: MRI HEAD WITHOUT CONTRAST TECHNIQUE: Multiplanar, multiecho pulse sequences of the brain and surrounding structures were obtained without intravenous contrast. COMPARISON:  Head CT from yesterday FINDINGS: Brain: Restricted diffusion in a wedge-shaped area extending from the right anterior putamen through the anterior limb internal capsule to the caudate head. Distinct second area of acute infarct or posteriorly with the same morphology, extending from putamen to caudate body. No acute hemorrhage. Chronic microvascular disease with moderate ischemic gliosis throughout cerebral white matter. Small remote left frontal infarct at the vertex. There is lateral and third ventriculomegaly that is likely from atrophy. No temporal horn dilatation typical of an obstructive for communicating hydrocephalus. Vascular: Preserved flow voids.  No focal marrow lesion. Skull and upper cervical spine: Negative Sinuses/Orbits: Negative IMPRESSION: 1. Two acute infarcts in the right lateral lenticulostriate distribution. 2. Chronic microvascular disease and small  remote left frontal infarct. 3. Lateral and third ventriculomegaly, favor atrophy over communicating hydrocephalus. Electronically Signed   By: Monte Fantasia M.D.   On: 10/28/2016 11:24     Assessment/Plan: Diagnosis: Right basal ganglia infarct Labs and images independently reviewed.  Records reviewed and summated above. Stroke: Continue secondary stroke prophylaxis and Risk Factor Modification listed below:   Antiplatelet therapy:   Blood Pressure Management:  Continue current medication with prn's with permisive HTN per primary team Statin Agent:   Prediabetes management:   Left sided hemiparesis: fit for orthosis to prevent contractures (resting hand splint for day, wrist cock up splint at night, PRAFO, etc) Motor recovery: Fluoxetine  1. Does  the need for close, 24 hr/day medical supervision in concert with the patient's rehab needs make it unreasonable for this patient to be served in a less intensive setting? Yes  2. Co-Morbidities requiring supervision/potential complications: CAD s/p CABG (cont meds), HTN (monitor and provide prns in accordance with increased physical exertion and pain), right BKA (cont prosthesis), hypokalemia (continue to monitor and replete as necessary), ?UTI (treat if necessary), Carotid artery stenosis (plans per vascular/neurointerventional radiology due to complexity) 3. Due to bladder management, safety, skin/wound care, disease management and patient education, does the patient require 24 hr/day rehab nursing? Yes 4. Does the patient require coordinated care of a physician, rehab nurse, PT (1-2 hrs/day, 5 days/week), OT (1-2 hrs/day, 5 days/week) and SLP (1-2 hrs/day, 5 days/week) to address physical and functional deficits in the context of the above medical diagnosis(es)? Yes Addressing deficits in the following areas: balance, endurance, locomotion, strength, transferring, bowel/bladder control, bathing, dressing, toileting, speech, swallowing and  psychosocial support 5. Can the patient actively participate in an intensive therapy program of at least 3 hrs of therapy per day at least 5 days per week? Yes 6. The potential for patient to make measurable gains while on inpatient rehab is fair 7. Anticipated functional outcomes upon discharge from inpatient rehab are max assist  with PT, max assist with OT, supervision and min assist with SLP. 8. Estimated rehab length of stay to reach the above functional goals is: 16-19 days. 9. Does the patient have adequate social supports and living environment to accommodate these discharge functional goals? Potentially 10. Anticipated D/C setting: Home 11. Anticipated post D/C treatments: HH therapy and Home excercise program 12. 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  Wife will be unable to care for him by herself at discharge.  If additional caregiver support unavailable, would consider SNF after completion of medical workup.   Patient has agreed to participate in recommended program. Potentially Note that insurance prior authorization may be required for reimbursement for recommended care.  Comment: Rehab Admissions Coordinator to follow up.  Delice Lesch, MD, [facility] 10/29/2016

## 2016-10-30 NOTE — PMR Pre-admission (Signed)
PMR Admission Coordinator Pre-Admission Assessment  Patient: [name] Cook is an 75 y.o., male MRN: [id number] DOB: [birth year] Height: 5\' 8"  (172.7 cm) Weight: 92 kg (202 lb 12.8 oz)              Insurance Information HMO: No   PPO:       PCP:       IPA:       80/20:       OTHER:   PRIMARY:  Medicare A/B      Policy#: [id number]      Subscriber: Lamont Dowdy CM Name:        Phone#:       Fax#:   Pre-Cert#:        Employer:  Retired Benefits:  Phone #:       Name: Checked in [place]. Date: 11/25/06     Deduct:  $1316      Out of Pocket Max: None      Life Max: unlimited CIR: 100%      SNF: 100 days Outpatient: 80%     Co-Pay: 20% Home Health: 100%      Co-Pay: none DME: 80%     Co-Pay: 20% Providers: patient's choice  SECONDARY:  [employer]      Policy#: [id number]      Subscriber: Lamont Dowdy CM Name:        Phone#:       Fax#:   Pre-Cert#:        Employer:  Retired Benefits:  Phone #: [phone]     Name:   Eff. 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 Per chart review patient lives with spouse. Use wheelchair for primary locomotion and did have a prosthesis for right BKA. Presented 10/27/2016 with fall and reported left-sided weakness and slurred speech. Denied loss of consciousness. No recent headaches  reported. CT/MRI showed 2 acute infarcts in the right lateral lenticulostriate distribution. CT angiogram head and neck negative for emergent large vessel occlusion. Pseudoaneurysm of the proximal brachiocephalic artery. Moderate to severe tandem stenosis of the right MCA mid M1 segment and at the right MCA bifurcation as well as high-grade radiographic string sign stenosis of the left ICA. Patient did not receive TPA. Echocardiogram with ejection fraction of [id number] grade 2 diastolic dysfunction. Vascular surgery consulted as well as plan neuro interventional radiology to consider formal cerebral arteriography for further evaluation of innominate artery pseudoaneurysm. Neurology consulted presently on aspirin for CVA prophylaxis. Subcutaneous Lovenox for DVT prophylaxis. Hospital course hypokalemia with supplement added. Physical therapy evaluation completed with recommendations of  physical medicine rehabilitation consult.   Total: 11=NIH  Past Medical History  Past Medical History:  Diagnosis Date  . Arthritis   . CAD (coronary artery disease)    2014 75% left main stenosis, LAD 80% followed by a mid subtotal stenosis, circumflex 90% stenosis, right coronary artery occluded, SVG to RCA occluded, SVG ramus occluded, SVG to OM patent without a sequential anastomosis noted. Graft had a focal 99% stenosis which was stented. 3.5 x 16 mm premier. b. cath 10/13/2014 DES to SVG to OM overlap previous stent  . Cholelithiasis 2014  . Cogan syndrome   . Colon polyps   . Diabetes mellitus without complication ([place])   . Dyslipidemia   . Gallstones   . GERD (gastroesophageal reflux disease)   . Hepatic hemangioma 2014  . Hx of right BKA ([place])    a. 2/2 necrotizing fasciitis.  . Hypertension   . Kidney stones   . Myocardial infarction 1999  . Necrotizing fasciitis ([place])   . Peptic ulcer disease   . Peripheral vascular disease ([place])     Family History  family history includes Diabetes in his sister;  Heart attack in his father; Heart disease in his maternal grandfather and mother; Hyperlipidemia in his mother; Hypertension in his mother; Other in his mother; Rheum arthritis in his father.  Prior Rehab/Hospitalizations: No rehab admissions  Has the patient had major surgery during 100 days prior to admission? No  Current Medications   Current Facility-Administered Medications:  .   stroke: mapping our early stages of recovery book, , Does not apply, Once, Vianne Bulls, MD .  acetaminophen (TYLENOL) suppository 650 mg, 650 mg, Rectal, Q4H PRN, Ilene Qua Opyd, MD .  aspirin suppository 300 mg, 300 mg, Rectal, Daily, 300 mg at 10/28/16 0918 **OR** aspirin tablet 325 mg, 325 mg, Oral, Daily, Ilene Qua Opyd, MD, 325 mg at 10/30/16 1011 .  atorvastatin (LIPITOR) tablet 80 mg, 80 mg, Oral, q1800, Rosalin Hawking, MD, 80 mg at 10/29/16 1811 .  calcium-vitamin D (OSCAL WITH D) 500-200 MG-UNIT per tablet 1 tablet, 1 tablet, Oral, Daily, Vianne Bulls, MD, 1 tablet at 10/30/16 1011 .  chlorhexidine (PERIDEX) 0.12 % solution 15 mL, 15 mL, Mouth Rinse, BID, Jessica U Vann, DO, 15 mL at 10/30/16 1006 .  clopidogrel (PLAVIX) tablet 75 mg, 75 mg, Oral, Daily, Rosalin Hawking, MD, 75 mg at 10/30/16 1011 .  diphenhydrAMINE (BENADRYL) capsule 25 mg, 25 mg, Oral, QHS PRN, Ilene Qua Opyd, MD .  enoxaparin (LOVENOX) injection 40 mg, 40 mg, Subcutaneous, Q24H, Ilene Qua Opyd, MD, 40 mg at 10/29/16 1355 .  famotidine (PEPCID) tablet 20 mg, 20 mg, Oral, BID, Jessica U Vann, DO .  finasteride (PROSCAR) tablet 5 mg, 5 mg, Oral, Daily, Ilene Qua Opyd, MD, 5 mg at 10/30/16 1011 .  food thickener (THICK IT) powder, , Oral, PRN, Geradine Girt, DO .  haloperidol (HALDOL) tablet 1 mg, 1 mg, Oral, Q6H PRN, Tomi Bamberger Vann, DO, 1 mg at 10/30/16 1018 .  insulin aspart (novoLOG) injection 0-9 Units, 0-9 Units, Subcutaneous, TID WC, Gardiner Barefoot, NP, 1 Units at 10/30/16 0831 .  MEDLINE mouth rinse, 15 mL, Mouth Rinse, q12n4p,  Jessica U Vann, DO, 15 mL at 10/29/16 1600 .  MEDLINE mouth rinse, 15 mL, Mouth Rinse, BID, Jessica U Vann, DO, 15 mL at 10/30/16 1000 .  morphine 4 MG/ML injection 1 mg, 1 mg, Intravenous, Q3H PRN, Vianne Bulls, MD, 1 mg at 10/28/16 0540 .  multivitamin with minerals tablet 1 tablet, 1 tablet, Oral, Daily, Vianne Bulls, MD, 1 tablet at 10/30/16 1011 .  ondansetron (ZOFRAN) injection 4 mg, 4 mg, Intravenous, Q6H PRN, Vianne Bulls, MD, 4 mg at 10/28/16 0545 .  promethazine (PHENERGAN) injection 12.5 mg, 12.5 mg, Intravenous, Q6H PRN, Ilene Qua Opyd, MD, 12.5 mg at 10/28/16 0115 .  ranitidine (ZANTAC) 150 MG/10ML syrup 150 mg, 150 mg, Oral, Once, Dewaine Conger, MD .  senna-docusate (Senokot-S) tablet 1 tablet, 1 tablet, Oral, QHS PRN, Ilene Qua Opyd, MD .  tamsulosin (FLOMAX) capsule 0.8 mg, 0.8 mg, Oral, Daily, Ilene Qua Opyd, MD, 0.8 mg at 10/30/16 1011  Patients Current Diet: DIET DYS 2 Room service appropriate? Yes; Fluid consistency: Honey Thick  Precautions / Restrictions Precautions Precautions: Fall Other Brace/Splint: Rt BK prosthesis is here at hospital Restrictions Weight Bearing Restrictions: No   Has the patient had 2 or more falls or a fall with injury in the past year?Yes.  Wife reports 3-4 falls with transfers and left shoulder injury.   Prior Activity Level Limited Community (1-2x/wk): Went out only about 1 X a week per wife.  Home Assistive Devices / Equipment Home Assistive Devices/Equipment: Prosthesis (Rt prosthesis) Home Equipment: Wheelchair - manual  Prior Device Use: Indicate devices/aids used by the patient prior to current illness, exacerbation or injury? Manual wheelchair, Archivist.  Wife reports patient uses wheelchair most of the time.  Prior Functional Level Prior Function Level of Independence: Needs assistance Gait / Transfers Assistance Needed: used w/c for primary locomotion; w/c did not fit into bathroom, he installed grab bars and used  prosthesis to walk in/out ADL's / Homemaking Assistance Needed: able to don R protestics   Self Care: Did the patient need help bathing, dressing, using the toilet or eating?  Independent  Indoor Mobility: Did the patient need assistance with walking from room to room (with or without device)? Needed some help  Stairs: Did the patient need assistance with internal or external stairs (with or without device)? Needed some help  Functional Cognition: Did the patient need help planning regular tasks such as shopping or remembering to take medications? Independent  Current Functional Level Cognition  Arousal/Alertness: Awake/alert Overall Cognitive Status: Impaired/Different from baseline Current Attention Level: Sustained Orientation Level: Oriented X4 Following Commands: Follows one step commands with increased time Safety/Judgement: Decreased awareness of safety, Decreased awareness of deficits General Comments: L inattention and requires max cues to scan L. Pt unable to sustain L visual attention. R eye gaze, poor awareness to deficits after x3 attempts to transfer with therapy pt reports "oh you want me to get into the chair I can do that"  Attention: Sustained Sustained Attention: Impaired Sustained Attention Impairment: Verbal basic Memory: Impaired Memory Impairment: Decreased recall of new information Awareness: Impaired Awareness Impairment: Emergent impairment, Anticipatory impairment Problem Solving: Impaired Problem Solving Impairment: Verbal basic, Functional basic Safety/Judgment: Impaired    Extremity Assessment (includes Sensation/Coordination)  Upper Extremity Assessment: RUE deficits/detail RUE Deficits / Details: in supine demonstrates activation of triceps and scapula depression. pt unabel to demonstrates shoulder flexion  or elbow flexion at this time. flaccid digits  RUE Sensation: decreased light touch RUE Coordination: decreased fine motor, decreased gross  motor  Lower Extremity Assessment: Defer to PT evaluation RLE Deficits / Details: Rt BKA; excellent knee flexion and extension; small abrasions on patella and tibial tuberosity; bruising above patella. Wife reports likely from fall, although pt has had problems with developing blisters with all his  prosthetic legs LLE Deficits / Details: AAROM WFL; hip abdct/adduction 2/5, hip extension 2+, knee extension 2+; ankle DF 0; toe flexion 1+ LLE Sensation: decreased light touch (was correct in location of touch)    ADLs  Overall ADL's : Needs assistance/impaired Eating/Feeding: Minimal assistance, Bed level Eating/Feeding Details (indicate cue type and reason): needed cues for upright posture pt with secretions from L side of mouth Grooming: Min guard Grooming Details (indicate cue type and reason): provided cloth and mod cues to wipe face. Pt with decr awareness to secretions Upper Body Bathing: Maximal assistance Lower Body Bathing: Total assistance Lower Body Dressing: Total assistance Lower Body Dressing Details (indicate cue type and reason): don R LE prosthetic supine General ADL Comments: pt with x3 attempts to transfer into steady. Pt with hoyer lift pad placed and hoyer lift unable to sustain battery to tranfer to chair at this time.     Mobility  Overal bed mobility: Needs Assistance, +2 for physical assistance Bed Mobility: Rolling, Supine to Sit, Sit to Sidelying Rolling: +2 for physical assistance, Max assist Sidelying to sit: Max assist, +2 for physical assistance, HOB elevated, +2 for safety/equipment Supine to sit: HOB elevated, +2 for physical assistance, Max assist (HOB elevated due to finishing b'fast on arrival) Sit to supine: Max assist, +2 for physical assistance, +2 for safety/equipment Sit to sidelying: Max assist, +2 for physical assistance, +2 for safety/equipment General bed mobility comments: continues to need max cues for sequencing; assist to find lt bedrail with RUE  for rolling left    Transfers  Overall transfer level: Needs assistance Equipment used:  (bedpad; gait belt) Transfer via Lift Equipment: Letta Median Transfers: Sit to/from Stand Sit to Stand: +2 physical assistance, Max assist, From elevated surface General transfer comment: Attempted to stand x 3 with stedy (tall mirror in front of pt assisting him to maintain head up and midline--until fatigued); better clearance of hips yet unable to achieve height to close stedy seat; attempted to use maximove for OOB to chair however it was not working (took to Forensic scientist and taken out of service)    Ambulation / Gait / Stairs / Office manager / Balance Dynamic Sitting Balance Sitting balance - Comments: loses balance posterior and to the left, especially with decr attention; Balance Overall balance assessment: Needs assistance Sitting-balance support: Single extremity supported Sitting balance-Leahy Scale: Zero Sitting balance - Comments: loses balance posterior and to the left, especially with decr attention; Postural control: Posterior lean, Left lateral lean Standing balance support: Single extremity supported Standing balance-Leahy Scale: Zero Standing balance comment: unabl eto achieve full standing with RUE on bedrail or on back of chair in front of him    Special needs/care consideration BiPAP/CPAP No CPM No Continuous Drip IV No Dialysis No        Life Vest No Oxygen No Special Bed No Trach Size No Wound Vac (area) No      Skin Has old healed right BKA incision with calluses on stump                             Bowel mgmt: Last BM 10/27/16 Bladder mgmt: Condom catheter in place, incontinence Diabetic mgmt No    Previous Home Environment Living Arrangements: Spouse/significant other  Lives With: Spouse Type of Home: [place]: No  Discharge Living Setting Plans for Discharge Living Setting: Patient's home, House, Lives with  (  comment) (Lives with wife.) Type of Home at Discharge: House Discharge Home Layout: One level Discharge Home Access: Stairs to enter Entrance Stairs-Number of Steps: 2 small steps (Discussed need for ramp for patient.) Does the patient have any problems obtaining your medications?: No  Social/Family/Support Systems Patient Roles: Spouse, Parent (Has a wife and 2 sons.) Contact Information: Earley Pock - wife Anticipated Caregiver: wife and family and friends Anticipated Caregiver's Contact Information: [name] - wife - (h) [phone] (c) [phone] Ability/Limitations of Caregiver: Wife assisted as caregiver prior to CVA.  Has a son 20 mins away, son 4 hrs away,  friends of son locally, and others such as dtr-in-law.  Also has a good friend who is 5 minutes away who can assist. Caregiver Availability: Other (Comment) (Wife is aware that she will need increased caregivers at home after rehab discharge.) Discharge Plan Discussed with Primary Caregiver: Yes Is Caregiver In Agreement with Plan?: Yes Does Caregiver/Family have Issues with Lodging/Transportation while Pt is in Rehab?: No  Goals/Additional Needs Patient/Family Goal for Rehab: PT/OT min/mod assist, SLP supervision to min assist goals Expected length of stay: 16-19 days Cultural Considerations: None Dietary Needs: Dys 2, honey thick liquids Equipment Needs: TBD Pt/Family Agrees to Admission and willing to participate: Yes Program Orientation Provided & Reviewed with Pt/Caregiver Including Roles  & Responsibilities: Yes  Decrease burden of Care through IP rehab admission: N/A  Possible need for SNF placement upon discharge: Yes, if patient cannot manage functional level with caregiver support at home.  Patient Condition: This patient's medical and functional status has changed since the consult dated 10/29/16 in which the Rehabilitation Physician determined and documented that the patient was potentially appropriate for  intensive rehabilitative care in an inpatient rehabilitation facility. Issues have been addressed and update has been discussed with Dr. Posey Pronto and patient now appropriate for inpatient rehabilitation. Will admit to inpatient rehab today.   Preadmission Screen Completed By:  Retta Diones, 10/30/2016 12:22 PM ______________________________________________________________________   Discussed status with Dr. Posey Pronto on 10/30/16 at 1221 and received telephone approval for admission today.  Admission Coordinator:  Retta Diones, time1222/Date12/06/17

## 2016-10-30 NOTE — Progress Notes (Signed)
Speech Language Pathology Treatment: Dysphagia  Patient Details Name: [name] Cook MRN: [id number] DOB: 11-Apr-1941 Today's Date: 10/30/2016 Time: [id number] SLP Time Calculation (min) (ACUTE ONLY): 16 min  Assessment / Plan / Recommendation Clinical Impression  Treatment session concentrated of dysphagia follow up after MBS yesterday with wife and son at bedside. Verbal and visual education provided for thickening juice to honey consistency- wife verbalized comprehension. Pt required min-mod verbal reminders and rationale to perform 2 swallows. No indications of airway compromise. Wife reported mild pocketing during breakfast. Discharge to inpatient rehab today to continue executive function, speech and dysphagia intervention.    HPI HPI: Sencere Summerlinis a 75 y.o.malewith medical history significant for coronary artery disease status post CABG, type 2 diabetes mellitus, hypertension, hyperlipidemia, and GERD who presents to the ED with weakness involving the left face, left arm, and left leg resulting in a fall. 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 CXR mild hypo inflation accentuates the lung markings, likely low-grade CHF with mild interstitial edema. Mildly increased density in the right perihilar region may reflect subsegmental atelectasis.      SLP Plan  Continue with current plan of care     Recommendations  Diet recommendations: Dysphagia 2 (fine chop);Honey-thick liquid Liquids provided via: Cup Medication Administration: Whole meds with puree Supervision: Patient able to self feed;Full supervision/cueing for compensatory strategies Compensations: Slow rate;Small sips/bites;Minimize environmental distractions;Lingual sweep for clearance of pocketing;Multiple dry swallows after each  bite/sip Postural Changes and/or Swallow Maneuvers: Seated upright 90 degrees;Upright 30-60 min after meal                General recommendations: Rehab consult Oral Care Recommendations: Oral care BID Follow up Recommendations: Inpatient Rehab Plan: Continue with current plan of care       GO                Houston Siren 10/30/2016, 11:28 AM  Orbie Pyo Colvin Caroli.Ed [employer] [phone]

## 2016-10-30 NOTE — Progress Notes (Signed)
Pt transferred to 4W22 with no noted distress. Report called in to nurse Nastasia. Family at bedside.

## 2016-10-30 NOTE — H&P (Signed)
Physical Medicine and Rehabilitation Admission H&P    Chief Complaint  Patient presents with  . Cerebrovascular Accident    HPI: [name] Cook is a 75 y.o. 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 History taken from patient and family.  Friends at bedside as well. Patient is wheelchair bound--requires supervision for sit to stand transfers and able to ambulate occasionally. He was admitted on 10/27/16 with new onset right sided weakness, fall and inability to stand. NIHSS score 6 and CT head without acute abnormality. MRI brain done revealing 2 acute infarcts in the right lateral lenticulostriate distribution. CT angiogram head and neck negative for emergent large vessel occlusion with pseudoaneurysm of the proximal brachiocephalic artery, moderate to severe tandem stenosis of the right MCA mid M1 segment and at the right MCA bifurcation as well as high-grade radiographic string sign stenosis of the left ICA.Dr. Donnetta Hutching consulted for input and did not recommend L-CEA at this time. 2D echo with EF 65-70%, moderate LVH, grade 2 diastolic dysfunction with mild to moderate AVR and mild MVR. Dr. Erlinda Hong recommends 30 day event monitor to work up cardioembolic stroke--to continue DAPT for 3 months followed by plavix alone and Long term BP goal 130-150 due to L-ICA stenosis (string sign) .   Patient with marked oral phase dysphagia with residue and silent penetration of nectars--MBS done 12/5 and patient on dysphagia 2, honey liquids. Patient with resultant dense left hemiparesis, dysphagia with difficulty handling oral secretions, cognitive deficits requiring verbal and tactile cues to follow one step commands and left neglect. Therapy ongoing and CIR recommended for follow up therapy.    Review of Systems  HENT: Positive for hearing loss (lack of hearing in rigth and decreased  hearing in left). Negative for tinnitus.   Eyes: Positive for blurred vision (due to lack of glasses). Negative for double vision.  Respiratory: Positive for cough. Negative for shortness of breath.   Cardiovascular: Negative for chest pain and palpitations.  Gastrointestinal: Negative for abdominal pain and heartburn.       Hiccups since admission.  Hs used haldol for hiccups in the past  Genitourinary: Negative for dysuria.       Incontinent of bladder PTA  Musculoskeletal: Positive for falls (occasionally). Negative for myalgias.       Left shoulder and neck pain.   Neurological: Positive for speech change, focal weakness and seizures. Negative for dizziness, sensory change and headaches.  Psychiatric/Behavioral: The patient does not have insomnia.   All other systems reviewed and are negative.     Past Medical History:  Diagnosis Date  . Arthritis   . CAD (coronary artery disease)    2014 75% left main stenosis, LAD 80% followed by a mid subtotal stenosis, circumflex 90% stenosis, right coronary artery occluded, SVG to RCA occluded, SVG ramus occluded, SVG to OM patent without a sequential anastomosis noted. Graft had a focal 99% stenosis which was stented. 3.5 x 16 mm premier. b. cath 10/13/2014 DES to SVG to OM overlap previous stent  . Cholelithiasis 2014  . Cogan syndrome   . Colon polyps   . Diabetes mellitus without complication ([place])   . Dyslipidemia   . Gallstones   . GERD (gastroesophageal reflux disease)   . Hepatic hemangioma 2014  . Hx of right BKA ([place])    a. 2/2 necrotizing fasciitis.  . Hypertension   . Kidney stones   .  Myocardial infarction 1999  . Necrotizing fasciitis ([place])   . Peptic ulcer disease   . Peripheral vascular disease [facility])     Past Surgical History:  Procedure Laterality Date  . APPENDECTOMY  2007  . CORONARY ARTERY BYPASS GRAFT  1999   LIMA to the LAD, saphenous vein graft to obtuse marginal 1 and obtuse marginal 2, spahenous vein  graft to intermediate, spahenous vein graft to the right coronary artery in 1999  . LEFT HEART CATHETERIZATION WITH CORONARY ANGIOGRAM N/A 10/13/2014   Procedure: LEFT HEART CATHETERIZATION WITH CORONARY ANGIOGRAM;  Surgeon: Peter M Martinique, MD;  Location: [facility] CATH LAB;  Service: Cardiovascular;  Laterality: N/A;  . LEFT HEART CATHETERIZATION WITH CORONARY/GRAFT ANGIOGRAM N/A 07/19/2013   Procedure: LEFT HEART CATHETERIZATION WITH Beatrix Fetters;  Surgeon: Peter M Martinique, MD;  Location: [facility] CATH LAB;  Service: Cardiovascular;  Laterality: N/A;  . LEG AMPUTATION  1999   Right BKA  . PERCUTANEOUS CORONARY STENT INTERVENTION (PCI-S)  07/19/2013   Procedure: PERCUTANEOUS CORONARY STENT INTERVENTION (PCI-S);  Surgeon: Peter M Martinique, MD;  Location: [facility] CATH LAB;  Service: Cardiovascular;;    Family History  Problem Relation Age of Onset  . Hyperlipidemia Mother   . Hypertension Mother   . Other Mother     varicose veins  . Heart disease Mother   . Rheum arthritis Father   . Heart attack Father   . Heart disease Maternal Grandfather   . Diabetes Sister   . Colon cancer Neg Hx     Social History:  Married. 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 he reports that he quit smoking about 18 years ago. His smoking use included Cigarettes. He started smoking about 53 years ago. He has never used smokeless tobacco. He reports that he drinks about 2 shots of gin at nights. He reports that he does not use drugs.    Allergies  Allergen Reactions  . Altace [Ramipril] Other (See Comments)    hypotension    Medications Prior to Admission  Medication Sig Dispense Refill  . acetaminophen (TYLENOL) 500 MG tablet Take 1,000 mg by mouth every 6 (six) hours as needed for mild pain.     Marland Kitchen amLODipine (NORVASC) 5 MG tablet Take 1 tablet by mouth  daily 90 tablet 3  . aspirin 81 MG tablet Take 81 mg by mouth daily.      Marland Kitchen atorvastatin  (LIPITOR) 40 MG tablet Take 1 tablet (40 mg total) by mouth daily. 90 tablet 3  . Calcium Carbonate-Vitamin D (CALCIUM-CARB 600 + D) 600-125 MG-UNIT TABS Take 1 tablet by mouth daily.      . chlorthalidone (HYGROTON) 25 MG tablet TAKE 1 TABLET (25 MG TOTAL) BY MOUTH DAILY. 90 tablet 1  . cloNIDine (CATAPRES) 0.1 MG tablet TAKE 1 TABLET AS NEEDED FOR BLOOD PRESSURES GREATER THAN 160/100 90 tablet 1  . doxylamine, Sleep, (UNISOM) 25 MG tablet Take 25 mg by mouth at bedtime.    . finasteride (PROSCAR) 5 MG tablet Take 5 mg by mouth daily.    Marland Kitchen lisinopril (PRINIVIL,ZESTRIL) 20 MG tablet TAKE 1 TABLET (20 MG TOTAL) BY MOUTH DAILY. 90 tablet 1  . metoprolol succinate (TOPROL-XL) 50 MG 24 hr tablet TAKE 1 TABLET BY MOUTH DAILY WITH OR IMMEDIATELY FOLLOWING A MEAL 90 tablet 1  . Multiple Vitamin (MULTIVITAMIN) tablet Take 1 tablet by mouth daily.      . nitroGLYCERIN (NITROSTAT) 0.4 MG SL tablet Place 1  tablet (0.4 mg total) under the tongue every 5 (five) minutes as needed for chest pain. 25 tablet 3  . Probiotic Product (RESTORA) CAPS Take 1 capsule by mouth daily. 30 capsule 6  . ranitidine (ZANTAC) 75 MG tablet Take 75-150 mg by mouth See admin instructions. 75 mg in the am, 150 mg at bedtime    . tamsulosin (FLOMAX) 0.4 MG CAPS Take 0.8 mg by mouth daily.     Marland Kitchen EFFIENT 10 MG TABS tablet Take 1 tablet by mouth  daily (Patient not taking: Reported on 10/27/2016) 90 tablet 2  . metFORMIN (GLUCOPHAGE) 500 MG tablet Take 1 tablet by mouth  daily (Patient not taking: Reported on 10/27/2016) 90 tablet 3  . rosuvastatin (CRESTOR) 10 MG tablet Take 1 tablet (10 mg total) by mouth daily. (Patient not taking: Reported on 10/27/2016) 90 tablet 3    Home: Man expects to be discharged to:: Inpatient rehab Living Arrangements: Spouse/significant other Type of Home: House Home Equipment: Wheelchair - manual  Lives With: Spouse   Functional History: Prior Function Level of Independence:  Needs assistance Gait / Transfers Assistance Needed: used w/c for primary locomotion; w/c did not fit into bathroom, he installed grab bars and used prosthesis to walk in/out ADL's / [place] Needed: able to don R protestics   Functional Status:  Mobility: Bed Mobility Overal bed mobility: Needs Assistance, +2 for physical assistance Bed Mobility: Rolling, Supine to Sit, Sit to Sidelying Rolling: +2 for physical assistance, Max assist Sidelying to sit: Max assist, +2 for physical assistance, HOB elevated, +2 for safety/equipment Supine to sit: HOB elevated, +2 for physical assistance, Max assist (HOB elevated due to finishing b'fast on arrival) Sit to supine: Max assist, +2 for physical assistance, +2 for safety/equipment Sit to sidelying: Max assist, +2 for physical assistance, +2 for safety/equipment General bed mobility comments: continues to need max cues for sequencing; assist to find lt bedrail with RUE for rolling left Transfers Overall transfer level: Needs assistance Equipment used:  (bedpad; gait belt) Transfer via Lift Equipment: Letta Median Transfers: Sit to/from Stand Sit to Stand: +2 physical assistance, Max assist, From elevated surface General transfer comment: Attempted to stand x 3 with stedy (tall mirror in front of pt assisting him to maintain head up and midline--until fatigued); better clearance of hips yet unable to achieve height to close stedy seat; attempted to use maximove for OOB to chair however it was not working (took to Forensic scientist and taken out of service)      ADL: ADL Overall ADL's : Needs assistance/impaired Eating/Feeding: Minimal assistance, Bed level Eating/Feeding Details (indicate cue type and reason): needed cues for upright posture pt with secretions from L side of mouth Grooming: Min guard Grooming Details (indicate cue type and reason): provided cloth and mod cues to wipe face. Pt with decr awareness to secretions Upper  Body Bathing: Maximal assistance Lower Body Bathing: Total assistance Lower Body Dressing: Total assistance Lower Body Dressing Details (indicate cue type and reason): don R LE prosthetic supine General ADL Comments: pt with x3 attempts to transfer into steady. Pt with hoyer lift pad placed and hoyer lift unable to sustain battery to tranfer to chair at this time.   Cognition: Cognition Overall Cognitive Status: Impaired/Different from baseline Arousal/Alertness: Awake/alert Orientation Level: Oriented X4 Attention: Sustained Sustained Attention: Impaired Sustained Attention Impairment: Verbal basic Memory: Impaired Memory Impairment: Decreased recall of new information Awareness: Impaired Awareness Impairment: Emergent impairment, Anticipatory impairment Problem Solving: Impaired Problem Solving  Impairment: Verbal basic, Functional basic Safety/Judgment: Impaired Cognition Arousal/Alertness: Awake/alert Behavior During Therapy: Impulsive Overall Cognitive Status: Impaired/Different from baseline Area of Impairment: Attention, Following commands, Safety/judgement, Awareness, Problem solving Current Attention Level: Sustained Following Commands: Follows one step commands with increased time Safety/Judgement: Decreased awareness of safety, Decreased awareness of deficits Awareness: Intellectual Problem Solving: Requires verbal cues, Requires tactile cues, Difficulty sequencing General Comments: L inattention and requires max cues to scan L. Pt unable to sustain L visual attention. R eye gaze, poor awareness to deficits after x3 attempts to transfer with therapy pt reports "oh you want me to get into the chair I can do that"   Physical Exam: Blood pressure (!) 170/70, pulse 90, temperature 97.7 F (36.5 C), temperature source Axillary, resp. rate 20, height _0  (1.727 m), weight 92 kg (202 lb 12.8 oz), SpO2 96 %. Physical Exam  Nursing note and vitals reviewed. Constitutional: He  appears well-developed and well-nourished. No distress.  Up in bed with constant hiccups as well as frequent cough during exam.   HENT:  Head: Normocephalic and atraumatic.  Mouth/Throat: Oropharynx is clear and moist.  Eyes: Conjunctivae and EOM are normal.  Neck: Normal range of motion. Neck supple.  Cardiovascular: Normal rate and regular rhythm.   Murmur heard. Respiratory: Effort normal and breath sounds normal. No respiratory distress. He has no wheezes.  GI: Soft. Bowel sounds are normal. There is no tenderness.  Musculoskeletal: He exhibits edema (min edema left hand. ). He exhibits no tenderness.  RLE BKA  Neurological: He is alert.  A&Ox3 with cues for date Right gaze preference with left neglect.  Mild dysarthria.  Able to follow simple one and two step commands without difficulty.  Left facial weakness Sensation intact to light touch throughout Motor: LUE/LLE: 0/5, with emerging tone LLE. RUE: 5/5 proximal to distal RLE: HF 5/5   Skin: Skin is warm and dry. He is not diaphoretic.  Psychiatric: He has a normal mood and affect. His behavior is normal.    Results for orders placed or performed during the hospital encounter of 10/27/16 (from the past 48 hour(s))  Glucose, capillary     Status: Abnormal   Collection Time: 10/28/16  4:45 PM  Result Value Ref Range   Glucose-Capillary 122 (H) 65 - 99 mg/dL  Urine rapid drug screen (hosp performed)not at [facility]     Status: Abnormal   Collection Time: 10/28/16  5:44 PM  Result Value Ref Range   Opiates POSITIVE (A) NONE DETECTED   Cocaine NONE DETECTED NONE DETECTED   Benzodiazepines NONE DETECTED NONE DETECTED   Amphetamines NONE DETECTED NONE DETECTED   Tetrahydrocannabinol NONE DETECTED NONE DETECTED   Barbiturates NONE DETECTED NONE DETECTED    Comment:        DRUG SCREEN FOR MEDICAL PURPOSES ONLY.  IF CONFIRMATION IS NEEDED FOR ANY PURPOSE, NOTIFY LAB WITHIN 5 DAYS.        LOWEST DETECTABLE LIMITS FOR URINE DRUG  SCREEN Drug Class       Cutoff (ng/mL) Amphetamine      1000 Barbiturate      200 Benzodiazepine   338 Tricyclics       329 Opiates          300 Cocaine          300 THC              50   Urinalysis, Routine w reflex microscopic (not at [facility]     Status: Abnormal  Collection Time: 10/28/16  5:44 PM  Result Value Ref Range   Color, Urine YELLOW YELLOW   APPearance CLEAR CLEAR   Specific Gravity, Urine >1.046 (H) 1.005 - 1.030   pH 6.0 5.0 - 8.0   Glucose, UA NEGATIVE NEGATIVE mg/dL   Hgb urine dipstick NEGATIVE NEGATIVE   Bilirubin Urine NEGATIVE NEGATIVE   Ketones, ur 15 (A) NEGATIVE mg/dL   Protein, ur 30 (A) NEGATIVE mg/dL   Nitrite NEGATIVE NEGATIVE   Leukocytes, UA NEGATIVE NEGATIVE  Urine microscopic-add on     Status: Abnormal   Collection Time: 10/28/16  5:44 PM  Result Value Ref Range   Squamous Epithelial / LPF 0-5 (A) NONE SEEN   WBC, UA 0-5 0 - 5 WBC/hpf   RBC / HPF 0-5 0 - 5 RBC/hpf   Bacteria, UA RARE (A) NONE SEEN   Casts HYALINE CASTS (A) NEGATIVE   Urine-Other MUCOUS PRESENT   Glucose, capillary     Status: Abnormal   Collection Time: 10/28/16  8:43 PM  Result Value Ref Range   Glucose-Capillary 120 (H) 65 - 99 mg/dL   Comment 1 Notify RN    Comment 2 Document in Chart   Glucose, capillary     Status: None   Collection Time: 10/29/16 12:08 AM  Result Value Ref Range   Glucose-Capillary 99 65 - 99 mg/dL   Comment 1 Notify RN    Comment 2 Document in Chart   Glucose, capillary     Status: Abnormal   Collection Time: 10/29/16  4:26 AM  Result Value Ref Range   Glucose-Capillary 109 (H) 65 - 99 mg/dL   Comment 1 Notify RN    Comment 2 Document in Chart   Glucose, capillary     Status: Abnormal   Collection Time: 10/29/16  7:58 AM  Result Value Ref Range   Glucose-Capillary 120 (H) 65 - 99 mg/dL  Glucose, capillary     Status: Abnormal   Collection Time: 10/29/16 11:22 AM  Result Value Ref Range   Glucose-Capillary 141 (H) 65 - 99 mg/dL  Glucose,  capillary     Status: Abnormal   Collection Time: 10/29/16  4:09 PM  Result Value Ref Range   Glucose-Capillary 122 (H) 65 - 99 mg/dL  Glucose, capillary     Status: Abnormal   Collection Time: 10/29/16 10:03 PM  Result Value Ref Range   Glucose-Capillary 113 (H) 65 - 99 mg/dL  Basic metabolic panel     Status: Abnormal   Collection Time: 10/30/16  8:15 AM  Result Value Ref Range   Sodium 141 135 - 145 mmol/L   Potassium 3.5 3.5 - 5.1 mmol/L   Chloride 104 101 - 111 mmol/L   CO2 29 22 - 32 mmol/L   Glucose, Bld 127 (H) 65 - 99 mg/dL   BUN 12 6 - 20 mg/dL   Creatinine, Ser 0.96 0.61 - 1.24 mg/dL   Calcium 8.8 (L) 8.9 - 10.3 mg/dL   GFR calc non Af Amer >60 >60 mL/min   GFR calc Af Amer >60 >60 mL/min    Comment: (NOTE) The eGFR has been calculated using the CKD EPI equation. This calculation has not been validated in all clinical situations. eGFR's persistently <60 mL/min signify possible Chronic Kidney Disease.    Anion gap 8 5 - 15  Glucose, capillary     Status: Abnormal   Collection Time: 10/30/16  8:19 AM  Result Value Ref Range   Glucose-Capillary 132 (H) 65 -  99 mg/dL   Comment 1 Notify RN    Comment 2 Document in Chart   Glucose, capillary     Status: Abnormal   Collection Time: 10/30/16 12:02 PM  Result Value Ref Range   Glucose-Capillary 137 (H) 65 - 99 mg/dL   Comment 1 Notify RN    Comment 2 Document in Chart     Medical Problem List and Plan: 1.  Left hemiplegia, dysarthria, dysphagia secondary to right basal ganglia infarct with history of right BKA. 2.  DVT Prophylaxis/Anticoagulation: Pharmaceutical: Lovenox 3. Pain Management: tylenol prn.continue to educate patient family on LUE positioning.  4. Mood: LCSW to follow for evaluation and support.  5. Neuropsych: This patient is not fully capable of making decisions on his own behalf. 6. Skin/Wound Care: routine pressure relief measures 7. Fluids/Electrolytes/Nutrition: Monitor I/O. Check lytes in am.  Monitor hydration on honey luqids- concerns of adequate intake with ongoing hiccups and coughing due to difficulty with secretions 8. HTN: Monitor BP tid-- Goal of BP 130-150--avoid hypotension 9. 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  11. Hiccups: Baclofen prn for now. Haldol not fully effective--was used in the past and took a few days for resolution 12. GERD: On Zantac bid. 13. Pre-diabtes: Hgb A1c- 5.9.   Post Admission Physician Evaluation: 1. Preadmission assessment reviewed and changes made below. 2. Functional deficits secondary to right basal ganglia infarct with history of right BKA. 3. Patient is admitted to receive collaborative, interdisciplinary care between the physiatrist, rehab nursing staff, and therapy team. 4. Patient's level of medical complexity and substantial therapy needs in context of that medical necessity cannot be provided at a lesser intensity of care such as a SNF. 5. Patient has experienced substantial functional loss from his/her baseline which was documented above under the "Functional History" and "Functional Status" headings.  Judging by the patient's diagnosis, physical exam, and functional history, the patient has potential for functional progress which will result in measurable gains while on inpatient rehab.  These gains will be of substantial and practical use upon discharge  in facilitating mobility and self-care at the household level. 6. Physiatrist will provide 24 hour management of medical needs as well as oversight of the therapy plan/treatment and provide guidance as appropriate regarding the interaction of the two. 7. The Preadmission Screening has been reviewed and patient status is unchanged unless otherwise stated above. 8. 24 hour rehab nursing will assist with bladder management, safety, disease management, medication administration and patient education  and help integrate therapy concepts,  techniques,education, etc. 9. PT will assess and treat for/with: Lower extremity strength, range of motion, stamina, balance, functional mobility, safety, adaptive techniques and equipment, coping skills, pain control, stroke education.   Goals are: Mod A. 10. OT will assess and treat for/with: ADL's, functional mobility, safety, upper extremity strength, adaptive techniques and equipment, ego support, and community reintegration.   Goals are: Mod A. Therapy may proceed with showering this patient. 11. SLP will assess and treat for/with: speech, cognition.  Goals are: Min A/supervision. 12. Case Management and Social Worker will assess and treat for psychological issues and discharge planning. 48. Team conference will be held weekly to assess progress toward goals and to determine barriers to discharge. 14. Patient will receive at least 3 hours of therapy per day at least 5 days per week. 15. ELOS: 14-18 days.       16. Prognosis:  fair  Delice Lesch, MD, Mellody Drown 10/30/2016

## 2016-10-30 NOTE — Progress Notes (Signed)
Physical Therapy Treatment (late entry)  Impression: Patient continues to be limited by Lt inattention. Strength in LLE improved, yet does not translate fully to functional use (i.e. Standing, transfers) due to inattention.     10/29/16 1515  PT Visit Information  Last PT Received On 10/29/16  Assistance Needed +2  PT/OT/SLP Co-Evaluation/Treatment Yes  Reason for Co-Treatment Complexity of the patient's impairments (multi-system involvement);For patient/therapist safety  PT goals addressed during session Mobility/safety with mobility;Balance  History of Present Illness 75 y.o.malewith medical history significant for coronary artery disease status post CABG and subsequent graft stent, type 2 diabetes mellitus, hypertension, hyperlipidemia, and GERD who presents to the ED with weakness involving the left face, left arm, and left leg. he fell at home. MRI brain +Two acute infarcts in the right lateral lenticulostriate  Subjective Data  Patient Stated Goal wants to walk again  Precautions  Precautions Fall  Required Braces or Orthoses Other Brace/Splint  Other Brace/Splint Rt BK prosthesis is here at hospital  Pain Assessment  Pain Assessment No/denies pain  Cognition  Arousal/Alertness Awake/alert  Behavior During Therapy Impulsive  Overall Cognitive Status Impaired/Different from baseline  Area of Impairment Attention;Following commands;Safety/judgement;Awareness;Problem solving  Current Attention Level Sustained  Following Commands Follows one step commands with increased time  Safety/Judgement Decreased awareness of safety;Decreased awareness of deficits  Awareness Intellectual  Problem Solving Requires verbal cues;Requires tactile cues  General Comments Patient with head turned to his right unless cued to look to his left (which he could then do). He neglected LUE and several times required prompt to "find his lt arm."  Bed Mobility  Overal bed mobility Needs Assistance;+2 for  physical assistance  Bed Mobility Rolling;Sidelying to Sit;Sit to Supine  Rolling Total assist;+2 for physical assistance  Sidelying to sit Max assist;+2 for physical assistance;HOB elevated;+2 for safety/equipment  Sit to supine Max assist;+2 for physical assistance;+2 for safety/equipment  General bed mobility comments Pt requires cues for R UE to grap L UE and initiate bed mobility. Pt able to use R UE to push up into sitting  Transfers  Overall transfer level Needs assistance  Equipment used 2 person hand held assist  Transfers Sit to/from Stand  Sit to Stand +2 physical assistance;Max assist  General transfer comment Attempted to stand x 2; better clearance of hips yet unable to achieve full standing; Pt with strong L LE and needs facilitation for R side weight beaing x2 . pt with facilitation at shoulders for upright posture and cues for neck extension and to keep head neutral. pt with neck rotation with LOB immediatly  Modified Rankin (Stroke Patients Only)  Pre-Morbid Rankin Score 4  Modified Rankin 5  Balance  Overall balance assessment Needs assistance  Sitting-balance support Single extremity supported  Sitting balance-Leahy Scale Zero  Sitting balance - Comments loses balance posterior and to the left, especially with decr attention; if grasping rail with RUE, better balance; if Rt hand placed on mattress, begins to push to his left  General Comments  General comments (skin integrity, edema, etc.) wife present  PT - End of Session  Equipment Utilized During Treatment Gait belt  Activity Tolerance Patient limited by fatigue  Patient left in bed;with call bell/phone within reach;with bed alarm set;with family/visitor present (chair position in bed)  PT - Assessment/Plan  PT Plan Current plan remains appropriate  PT Frequency (ACUTE ONLY) Min 4X/week  Follow Up Recommendations CIR  PT equipment Other (comment) (TBD)  PT Goal Progression  Progress towards PT goals  Progressing toward goals  Acute Rehab PT Goals  Time For Goal Achievement 11/04/16  PT Time Calculation  PT Start Time (ACUTE ONLY) 1413  PT Stop Time (ACUTE ONLY) 1447  PT Time Calculation (min) (ACUTE ONLY) 34 min  PT General Charges  $$ ACUTE PT VISIT 1 Procedure  PT Treatments  $Neuromuscular Re-education 8-22 mins    10/30/2016 Barry Brunner, PT Pager: [phone]

## 2016-10-31 ENCOUNTER — Inpatient Hospital Stay (HOSPITAL_COMMUNITY): Payer: Medicare Other | Admitting: Physical Therapy

## 2016-10-31 ENCOUNTER — Inpatient Hospital Stay (HOSPITAL_COMMUNITY): Payer: Medicare Other | Admitting: Speech Pathology

## 2016-10-31 ENCOUNTER — Inpatient Hospital Stay (HOSPITAL_COMMUNITY): Payer: Medicare Other | Admitting: Occupational Therapy

## 2016-10-31 DIAGNOSIS — I639 Cerebral infarction, unspecified: Secondary | ICD-10-CM

## 2016-10-31 LAB — COMPREHENSIVE METABOLIC PANEL
ALK PHOS: 27 U/L — AB (ref 38–126)
ALT: 22 U/L (ref 17–63)
AST: 23 U/L (ref 15–41)
Albumin: 3.4 g/dL — ABNORMAL LOW (ref 3.5–5.0)
Anion gap: 13 (ref 5–15)
BUN: 16 mg/dL (ref 6–20)
CALCIUM: 9 mg/dL (ref 8.9–10.3)
CHLORIDE: 105 mmol/L (ref 101–111)
CO2: 23 mmol/L (ref 22–32)
CREATININE: 0.99 mg/dL (ref 0.61–1.24)
GFR calc non Af Amer: >60 mL/min (ref >60)
GLUCOSE: 152 mg/dL — AB (ref 65–99)
Potassium: 3.5 mmol/L (ref 3.5–5.1)
SODIUM: 141 mmol/L (ref 135–145)
Total Bilirubin: 1.2 mg/dL (ref 0.3–1.2)
Total Protein: 7.7 g/dL (ref 6.5–8.1)

## 2016-10-31 LAB — CBC WITH DIFFERENTIAL/PLATELET
BASOS ABS: 0 10*3/uL (ref 0.0–0.1)
Basophils Relative: 0 %
EOS ABS: 0.2 10*3/uL (ref 0.0–0.7)
Eosinophils Relative: 2 %
HCT: 40.1 % (ref 39.0–52.0)
HEMOGLOBIN: 13.6 g/dL (ref 13.0–17.0)
LYMPHS ABS: 1 10*3/uL (ref 0.7–4.0)
LYMPHS PCT: 10 %
MCH: 33 pg (ref 26.0–34.0)
MCHC: 33.9 g/dL (ref 30.0–36.0)
MCV: 97.3 fL (ref 78.0–100.0)
Monocytes Absolute: 0.9 10*3/uL (ref 0.1–1.0)
Monocytes Relative: 9 %
NEUTROS PCT: 79 %
Neutro Abs: 8.1 10*3/uL — ABNORMAL HIGH (ref 1.7–7.7)
Platelets: 230 10*3/uL (ref 150–400)
RBC: 4.12 MIL/uL — AB (ref 4.22–5.81)
RDW: 13.2 % (ref 11.5–15.5)
WBC: 10.3 10*3/uL (ref 4.0–10.5)

## 2016-10-31 LAB — GLUCOSE, CAPILLARY
GLUCOSE-CAPILLARY: 146 mg/dL — AB (ref 65–99)
GLUCOSE-CAPILLARY: 151 mg/dL — AB (ref 65–99)
GLUCOSE-CAPILLARY: 145 mg/dL — AB (ref 65–99)
Glucose-Capillary: 138 mg/dL — ABNORMAL HIGH (ref 65–99)

## 2016-10-31 MED ORDER — SENNOSIDES-DOCUSATE SODIUM 8.6-50 MG PO TABS
2.0000 | ORAL_TABLET | Freq: Every evening | ORAL | Status: DC | PRN
  Administered 2016-10-31: 2 via ORAL

## 2016-10-31 MED ORDER — PRO-STAT SUGAR FREE PO LIQD
30.0000 mL | Freq: Two times a day (BID) | ORAL | Status: DC
  Administered 2016-10-31 – 2016-11-21 (×35): 30 mL via ORAL
  Filled 2016-10-31 (×40): qty 30

## 2016-10-31 MED ORDER — BACLOFEN 5 MG HALF TABLET
5.0000 mg | ORAL_TABLET | Freq: Three times a day (TID) | ORAL | Status: DC
  Administered 2016-10-31: 5 mg via ORAL
  Filled 2016-10-31: qty 1

## 2016-10-31 MED ORDER — BACLOFEN 5 MG HALF TABLET
5.0000 mg | ORAL_TABLET | Freq: Four times a day (QID) | ORAL | Status: DC
  Administered 2016-10-31 – 2016-11-02 (×6): 5 mg via ORAL
  Filled 2016-10-31 (×5): qty 1

## 2016-10-31 NOTE — Progress Notes (Signed)
Noted patient last bowel movement 12/3. Notified Pam Love, PA given order to increase Senna-S to 2 tablets at bedtime PRN. Also given order to increase scheduled Baclofen to QID. Will continue to monitor patient. [place]

## 2016-10-31 NOTE — Discharge Instructions (Signed)
Inpatient Rehab Discharge Instructions  [name] Cook Discharge date and time:    Activities/Precautions/ Functional Status: Activity: no lifting, driving, or strenuous exercise for till cleared by MD Diet:  Wound Care:  Functional status:  ___ No restrictions     ___ Walk up steps independently ___ 24/7 supervision/assistance   ___ Walk up steps with assistance ___ Intermittent supervision/assistance  ___ Bathe/dress independently ___ Walk with walker     ___ Bathe/dress with assistance ___ Walk Independently    ___ Shower independently ___ Walk with assistance    ___ Shower with assistance ___ No alcohol     ___ Return to work/school ________  Special Instructions:  STROKE/TIA DISCHARGE INSTRUCTIONS SMOKING Cigarette smoking nearly doubles your risk of having a stroke & is the single most alterable risk factor  If you smoke or have smoked in the last 12 months, you are advised to quit smoking for your health.  Most of the excess cardiovascular risk related to smoking disappears within a year of stopping.  Ask you doctor about anti-smoking medications  [place] Quit Line: 1-800-QUIT NOW  Free Smoking Cessation Classes [phone]  CHOLESTEROL Know your levels; limit fat & cholesterol in your diet  Lipid Panel     Component Value Date/Time   CHOL 251 (H) 10/28/2016 0146   CHOL 256 (H) 09/20/2016 1339   TRIG 139 10/28/2016 0146   HDL 43 10/28/2016 0146   HDL 42 09/20/2016 1339   CHOLHDL 5.8 10/28/2016 0146   VLDL 28 10/28/2016 0146   LDLCALC 180 (H) 10/28/2016 0146   LDLCALC 154 (H) 09/20/2016 1339      Many patients benefit from treatment even if their cholesterol is at goal.  Goal: Total Cholesterol (CHOL) less than 160  Goal:  Triglycerides (TRIG) less than 150  Goal:  HDL greater than 40  Goal:  LDL (LDLCALC) less than 100   BLOOD PRESSURE American Stroke Association blood pressure target is less that 120/80 mm/Hg  Your discharge blood pressure is:  BP: (!)  176/71  Monitor your blood pressure  Limit your salt and alcohol intake  Many individuals will require more than one medication for high blood pressure  DIABETES (A1c is a blood sugar average for last 3 months) Goal HGBA1c is under 7% (HBGA1c is blood sugar average for last 3 months)  Diabetes:     Lab Results  Component Value Date   HGBA1C 5.9 (H) 10/28/2016     Your HGBA1c can be lowered with medications, healthy diet, and exercise.  Check your blood sugar as directed by your physician  Call your physician if you experience unexplained or low blood sugars.  PHYSICAL ACTIVITY/REHABILITATION Goal is 30 minutes at least 4 days per week  Activity: No driving, Therapies: See above Return to work: N/A  Activity decreases your risk of heart attack and stroke and makes your heart stronger.  It helps control your weight and blood pressure; helps you relax and can improve your mood.  Participate in a regular exercise program.  Talk with your doctor about the best form of exercise for you (dancing, walking, swimming, cycling).  DIET/WEIGHT Goal is to maintain a healthy weight  Your discharge diet is: DIET DYS 2 Room service appropriate? Yes; Fluid consistency: Honey Thick* liquids Your height is:  Height: 5\' 8"  (172.7 cm) Your current weight is: Weight: 90.4 kg (199 lb 6.4 oz) Your Body Mass Index (BMI) is:  BMI (Calculated): 30.4  Following the type of diet specifically designed for you  will help prevent another stroke.  Your goal weight is:   Your goal Body Mass Index (BMI) is 19-24.  Healthy food habits can help reduce 3 risk factors for stroke:  High cholesterol, hypertension, and excess weight.  RESOURCES Stroke/Support Group:  Call [phone]   STROKE EDUCATION PROVIDED/REVIEWED AND GIVEN TO PATIENT Stroke warning signs and symptoms How to activate emergency medical system (call 911). Medications prescribed at discharge. Need for follow-up after discharge. Personal risk  factors for stroke. Pneumonia vaccine given:  Flu vaccine given:  My questions have been answered, the writing is legible, and I understand these instructions.  I will adhere to these goals & educational materials that have been provided to me after my discharge from the hospital.     My questions have been answered and I understand these instructions. I will adhere to these goals and the provided educational materials after my discharge from the hospital.  Patient/Caregiver Signature _______________________________ Date __________  Clinician Signature _______________________________________ Date __________  Please bring this form and your medication list with you to all your follow-up doctor's appointments.

## 2016-10-31 NOTE — Evaluation (Signed)
Physical Therapy Assessment and Plan  Patient Details  Name: [name] Cook MRN: [id number] Date of Birth: [birth year]  PT Diagnosis: Abnormal posture, Abnormality of gait, Cognitive deficits, Difficulty walking, Edema, Hemiplegia non-dominant and Muscle weakness Rehab Potential: Fair ELOS: 3-4 weeks   Today's Date: 10/31/2016 PT Individual Time: 1305-1430 PT Individual Time Calculation (min): 85 min     Problem List: Patient Active Problem List   Diagnosis Date Noted  . Right basal ganglia embolic stroke ([place]) 66/59/9357  . 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 Gastroesophageal reflux disease without esophagitis   . S/P BKA (below knee amputation) unilateral, right ([place])   . Benign essential HTN   . Hiccups   . 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 Cerebral infarction ([place])   . Hypertensive crisis   . Acute lower UTI   . Stenosis of carotid artery   . Prediabetes   . CVA (cerebral vascular accident) ([place]) 10/28/2016  . Bilateral carotid artery stenosis   . Left-sided weakness 10/27/2016  . Ischemic stroke ([place]) 10/27/2016  . Acute ischemic stroke ([place]) 10/27/2016  . NSTEMI (non-ST elevated myocardial infarction) ([place]) 10/12/2014  . Hx of right BKA ([place]) 10/12/2014  . 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 Atherosclerosis of native arteries of extremity with intermittent claudication ([place]) 12/08/2013  . Diabetes mellitus with circulatory complication ([place])   . Essential hypertension, benign 07/17/2013  . Hypokalemia 07/17/2013  . Non-ST elevation myocardial infarction (NSTEMI), initial episode of care ([place]) 07/16/2013  . GERD (gastroesophageal reflux disease) 03/15/2013  . Peripheral vascular disease ([place]) 03/03/2013  . CAROTID BRUIT 02/06/2011  . Dyslipidemia 10/31/2009  . Essential hypertension 10/31/2009  . Coronary atherosclerosis 10/31/2009    Past  Medical History:  Past Medical History:  Diagnosis Date  . Arthritis   . CAD (coronary artery disease)    2014 75% left main stenosis, LAD 80% followed by a mid subtotal stenosis, circumflex 90% stenosis, right coronary artery occluded, SVG to RCA occluded, SVG ramus occluded, SVG to OM patent without a sequential anastomosis noted. Graft had a focal 99% stenosis which was stented. 3.5 x 16 mm premier. b. cath 10/13/2014 DES to SVG to OM overlap previous stent  . Cholelithiasis 2014  . Cogan syndrome   . Colon polyps   . Diabetes mellitus without complication ([place])   . Dyslipidemia   . Gallstones   . GERD (gastroesophageal reflux disease)   . Hepatic hemangioma 2014  . Hx of right BKA ([place])    a. 2/2 necrotizing fasciitis.  . Hypertension   . Kidney stones   . Myocardial infarction 1999  . Necrotizing fasciitis ([place])   . Peptic ulcer disease   . Peripheral vascular disease [facility])    Past Surgical History:  Past Surgical History:  Procedure Laterality Date  . APPENDECTOMY  2007  . CORONARY ARTERY BYPASS GRAFT  1999   LIMA to the LAD, saphenous vein graft to obtuse marginal 1 and obtuse marginal 2, spahenous vein graft to intermediate, spahenous vein graft to the right coronary artery in 1999  . LEFT HEART CATHETERIZATION WITH CORONARY ANGIOGRAM N/A 10/13/2014   Procedure: LEFT HEART CATHETERIZATION WITH CORONARY ANGIOGRAM;  Surgeon: Peter M Martinique, MD;  Location: [facility] CATH LAB;  Service: Cardiovascular;  Laterality: N/A;  . LEFT HEART CATHETERIZATION WITH CORONARY/GRAFT ANGIOGRAM N/A 07/19/2013   Procedure: LEFT HEART CATHETERIZATION WITH Beatrix Fetters;  Surgeon: Collier Salina  M Martinique, MD;  Location: [facility] CATH LAB;  Service: Cardiovascular;  Laterality: N/A;  . LEG AMPUTATION  1999   Right BKA  . PERCUTANEOUS CORONARY STENT INTERVENTION (PCI-S)  07/19/2013   Procedure: PERCUTANEOUS CORONARY STENT INTERVENTION (PCI-S);  Surgeon: Peter M Martinique, MD;  Location: 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 History taken from patient and family.  Friends at bedside as well. Patient is wheelchair bound--requires supervision for sit to stand transfers and able to ambulate occasionally. He was admitted on 10/27/16 with new onset right sided weakness, fall and inability to stand. NIHSS score 6 and CT head without acute abnormality. MRI brain done revealing 2 acute infarcts in the right lateral lenticulostriate distribution. CT angiogram head and neck negative for emergent large vessel occlusion with pseudoaneurysm of the proximal brachiocephalic artery, moderate to severe tandem stenosis of the right MCA mid M1 segment and at the right MCA bifurcation as well as high-grade radiographic string sign stenosis of the left ICA.Dr. Donnetta Hutching consulted for input and did not recommend L-CEA at this time. 2D echo with EF 65-70%, moderate LVH, grade 2 diastolic dysfunction with mild to moderate AVR and mild MVR. Dr. Erlinda Hong recommends 30 day event monitor to work up cardioembolic stroke--to continue DAPT for 3 months followed by plavix alone and Long term BP goal 130-150 due to L-ICA stenosis (string sign) .   Patient with marked oral phase dysphagia with residue and silent penetration of nectars--MBS done 12/5 and patient on dysphagia 2, honey liquids. Patient with resultant dense left hemiparesis, dysphagia with difficulty handling oral secretions, cognitive deficits requiring verbal and tactile cues to follow one step commands and left neglect Patient transferred to CIR on 10/30/2016.   Patient currently requires total with mobility secondary to muscle weakness, muscle joint tightness and muscle paralysis, decreased cardiorespiratoy endurance, impaired timing and sequencing,  abnormal tone, unbalanced muscle activation and decreased coordination, decreased midline orientation and left side neglect, decreased initiation, decreased attention, decreased awareness, decreased problem solving, decreased safety awareness, decreased memory and delayed processing and decreased sitting balance, decreased standing balance, decreased postural control, hemiplegia and decreased balance strategies.  Prior to hospitalization, patient was modified independent  with mobility and lived with Spouse in a House home.  Home access is 2Stairs to enter.  Patient will benefit from skilled PT intervention to maximize safe functional mobility, minimize fall risk and decrease caregiver burden for planned discharge home with 24 hour assist.  Anticipate patient will benefit from follow up [facility] at discharge.  PT - End of Session Activity Tolerance: Tolerates 10 - 20 min activity with multiple rests Endurance Deficit: Yes PT Assessment Rehab Potential (ACUTE/IP ONLY): Fair Barriers to Discharge: Inaccessible home environment;Decreased caregiver support PT Patient demonstrates impairments in the following area(s): Balance;Behavior;Endurance;Motor;Nutrition;Edema;Pain;Perception;Safety;Sensory PT Transfers Functional Problem(s): Bed Mobility;Bed to Chair;Car;Furniture PT Locomotion Functional Problem(s): Wheelchair Mobility;Ambulation;Stairs PT Plan PT Intensity: Minimum of 1-2 x/day ,45 to 90 minutes PT Frequency: 5 out of 7 days PT Duration Estimated Length of Stay: 3-4 weeks PT Treatment/Interventions: Ambulation/gait training;Balance/vestibular training;Cognitive remediation/compensation;Community reintegration;Discharge planning;Disease management/prevention;DME/adaptive equipment instruction;Functional electrical stimulation;Functional mobility training;Neuromuscular re-education;Pain management;Patient/family education;Psychosocial support;Splinting/orthotics;Stair training;Therapeutic  Activities;Therapeutic Exercise;UE/LE Strength taining/ROM;UE/LE Coordination activities;Visual/perceptual remediation/compensation;Wheelchair propulsion/positioning PT Transfers Anticipated Outcome(s): mod A PT Locomotion Anticipated Outcome(s): mod A wheelchair level PT Recommendation Follow Up Recommendations: Home  health PT;24 hour supervision/assistance Patient destination: Home Equipment Recommended: To be determined  Skilled Therapeutic Intervention Skilled therapeutic intervention initiated after completion of evaluation. Discussed with patient and wife goals and purpose of PT. Patient in [place] wheelchair with prosthesis donned upon arrival with wife and son present. Patient required max verbal cues to manage oral secretions and wipe mouth and max-total multimodal cues for L attention to environment and body throughout session. Patient requires total A for intellectual awareness of deficits, stating, "Oh do you want me to stand up? I can do that" multiple times although patient unable to come to complete stand using [employer] with max-total A and multiple attempts. In upright TIS wheelchair, patient with LOB forward and to L and minimally able to correct. Patient transferred back to bed with Maximove and total A, +2 for rolling and repositioning in bed. Patient left semi reclined in bed with all needs in reach and wife in room.   PT Evaluation Precautions/Restrictions Precautions Precautions: Fall Precaution Comments: dense L hemi, old R BKA with prosthesis Required Braces or Orthoses: Sling Other Brace/Splint: for LUE after fall Restrictions Weight Bearing Restrictions: No General Chart Reviewed: Yes Family/Caregiver Present: Yes  Pain Pain Assessment Pain Assessment: No/denies pain Home Living/Prior Functioning Home Living Available Help at Discharge: Family;Available PRN/intermittently Type of Home: House Home Access: Stairs to enter [employer] of Steps: 2 Entrance  Stairs-Rails: None (can get rail put in) Home Layout: One level Bathroom Shower/Tub: Multimedia programmer: Standard Bathroom Accessibility: No  Lives With: Spouse Prior Function Level of Independence: Independent with gait;Independent with basic ADLs;Independent with transfers;Requires assistive device for independence (prosthesis)  Able to Take Stairs?: Yes Driving: Yes 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, Public affairs consultant Leisure: Hobbies-yes (Comment) Comments: enjoys golfing, likes wood working and working on Corporate investment banker   Defer to OT evaluation  Cognition Overall Cognitive Status: Impaired/Different from baseline Arousal/Alertness: Awake/alert Orientation Level: Oriented to person;Oriented to place;Oriented to situation Attention: Sustained Sustained Attention: Impaired Sustained Attention Impairment: Verbal basic;Functional basic Memory: Impaired Memory Impairment: Decreased recall of new information Awareness: Impaired Awareness Impairment: Emergent impairment;Anticipatory impairment Problem Solving: Impaired Problem Solving Impairment: Verbal basic;Functional basic Executive Function: Initiating;Decision Making;Self Monitoring;Self Correcting;Reasoning Reasoning: Impaired Reasoning Impairment: Verbal basic;Functional basic Decision Making: Impaired Decision Making Impairment: Verbal basic;Functional basic Initiating: Impaired Initiating Impairment: Verbal basic;Functional basic Self Monitoring: Impaired Self Monitoring Impairment: Verbal basic;Functional basic Self Correcting: Impaired Self Correcting Impairment: Verbal basic;Functional basic Safety/Judgment: Impaired Sensation Sensation Light Touch: Appears Intact Hot/Cold: Appears Intact Proprioception: Impaired Detail Proprioception Impaired Details: Absent LUE;Absent LLE Coordination Gross Motor Movements are Fluid and Coordinated: No Fine Motor Movements  are Fluid and Coordinated: No Coordination and Movement Description: dense L hemi  Motor  Motor Motor: Hemiplegia;Abnormal tone;Abnormal postural alignment and control  Mobility Bed Mobility Bed Mobility: Sit to Supine Sit to Supine: 1: +2 Total assist Sit to Supine - Details (indicate cue type and reason): Maximove Transfers Transfers: Yes Transfer via Lift Equipment: Maximove Locomotion  Ambulation Ambulation: No Gait Gait: No Stairs / Additional Locomotion Stairs: No Wheelchair Mobility Wheelchair Mobility: No (TIS wheelchair)  Trunk/Postural Assessment  Cervical Assessment Cervical Assessment: Exceptions to [facility] (rotated R with L inattention) Thoracic Assessment Thoracic Assessment: Exceptions to [facility] (rounded shoulders) Lumbar Assessment Lumbar Assessment: Exceptions to [facility] (posterior pelvic tilt) Postural Control Postural Control: Deficits on evaluation Righting Reactions: absent  Extremity Assessment  RUE Assessment RUE Assessment: Within Functional Limits LUE Assessment LUE Assessment: Exceptions to [facility] LUE Strength LUE Overall Strength: Deficits LUE  Overall Strength Comments: grossly 0/5, flaccid RLE Assessment RLE Assessment: Within Functional Limits (hip and knee) LLE Assessment LLE Assessment: Exceptions to [facility] LLE Strength LLE Overall Strength: Deficits LLE Overall Strength Comments: 0/5, emerging tone   See Function Navigator for Current Functional Status.   Refer to Care Plan for Long Term Goals  Recommendations for other services: None   Discharge Criteria: Patient will be discharged from PT if patient refuses treatment 3 consecutive times without medical reason, if treatment goals not met, if there is a change in medical status, if patient makes no progress towards goals or if patient is discharged from hospital.  The above assessment, treatment plan, treatment alternatives and goals were discussed and mutually agreed upon: by patient and by  family  Laretta Alstrom 10/31/2016, 5:32 PM

## 2016-10-31 NOTE — Progress Notes (Signed)
Social Work Assessment and Plan Social Work Assessment and Plan  Patient Details  Name: [name] Cook MRN: [id number] Date of Birth: 02-Apr-1941  Today's Date: 10/31/2016  Problem List:  Patient Active Problem List   Diagnosis Date Noted  . Right basal ganglia embolic stroke ([place]) [id number]  . Cerebral infarction due to stenosis of right carotid artery ([place])   . Gastroesophageal reflux disease without esophagitis   . S/P BKA (below knee amputation) unilateral, right ([place])   . Benign essential HTN   . Hiccups   . 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 Cerebral infarction ([place])   . Hypertensive crisis   . Acute lower UTI   . Stenosis of carotid artery   . Prediabetes   . CVA (cerebral vascular accident) ([place]) 10/28/2016  . Bilateral carotid artery stenosis   . Left-sided weakness 10/27/2016  . Ischemic stroke ([place]) 10/27/2016  . Acute ischemic stroke ([place]) 10/27/2016  . NSTEMI (non-ST elevated myocardial infarction) ([place]) 10/12/2014  . Hx of right BKA ([place]) 10/12/2014  . 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 Atherosclerosis of native arteries of extremity with intermittent claudication ([place]) 12/08/2013  . Diabetes mellitus with circulatory complication ([place])   . Essential hypertension, benign 07/17/2013  . Hypokalemia 07/17/2013  . Non-ST elevation myocardial infarction (NSTEMI), initial episode of care ([place]) 07/16/2013  . GERD (gastroesophageal reflux disease) 03/15/2013  . Peripheral vascular disease ([place]) 03/03/2013  . CAROTID BRUIT 02/06/2011  . Dyslipidemia 10/31/2009  . Essential hypertension 10/31/2009  . Coronary atherosclerosis 10/31/2009   Past Medical History:  Past Medical History:  Diagnosis Date  . Arthritis   . CAD (coronary artery disease)    2014 75% left main stenosis, LAD 80% followed by a mid subtotal stenosis, circumflex 90% stenosis, right coronary artery occluded, SVG  to RCA occluded, SVG ramus occluded, SVG to OM patent without a sequential anastomosis noted. Graft had a focal 99% stenosis which was stented. 3.5 x 16 mm premier. b. cath 10/13/2014 DES to SVG to OM overlap previous stent  . Cholelithiasis 2014  . Cogan syndrome   . Colon polyps   . Diabetes mellitus without complication ([place])   . Dyslipidemia   . Gallstones   . GERD (gastroesophageal reflux disease)   . Hepatic hemangioma 2014  . Hx of right BKA ([place])    a. 2/2 necrotizing fasciitis.  . Hypertension   . Kidney stones   . Myocardial infarction 1999  . Necrotizing fasciitis ([place])   . Peptic ulcer disease   . Peripheral vascular disease [facility])    Past Surgical History:  Past Surgical History:  Procedure Laterality Date  . APPENDECTOMY  2007  . CORONARY ARTERY BYPASS GRAFT  1999   LIMA to the LAD, saphenous vein graft to obtuse marginal 1 and obtuse marginal 2, spahenous vein graft to intermediate, spahenous vein graft to the right coronary artery in 1999  . LEFT HEART CATHETERIZATION WITH CORONARY ANGIOGRAM N/A 10/13/2014   Procedure: LEFT HEART CATHETERIZATION WITH CORONARY ANGIOGRAM;  Surgeon: Peter M Martinique, MD;  Location: [facility] CATH LAB;  Service: Cardiovascular;  Laterality: N/A;  . LEFT HEART CATHETERIZATION WITH CORONARY/GRAFT ANGIOGRAM N/A 07/19/2013   Procedure: LEFT HEART CATHETERIZATION WITH Beatrix Fetters;  Surgeon: Peter M Martinique, MD;  Location: [facility] CATH LAB;  Service: Cardiovascular;  Laterality: N/A;  . LEG AMPUTATION  1999   Right BKA  . PERCUTANEOUS CORONARY STENT INTERVENTION (PCI-S)  07/19/2013  Procedure: PERCUTANEOUS CORONARY STENT INTERVENTION (PCI-S);  Surgeon: Peter M Martinique, MD;  Location: [facility] CATH LAB;  Service: Cardiovascular;;   Social History:  reports that he quit smoking about 18 years ago. His smoking use included Cigarettes. He started smoking about 53 years ago. He has never used smokeless tobacco. He reports that he drinks about 8.4 oz of alcohol  per week . He reports that he does not use drugs.  Family / Support Systems Marital Status: Married Patient Roles: Spouse, Parent Spouse/Significant Other: [name] [id number] Children: Arafat Winkle [id number] Other Supports: Daughter in-law and friends Anticipated Caregiver: Wife and family and friends Ability/Limitations of Caregiver: Wife has assisted pt prior to admission, but can only do so much for him. They are trying to come up with a 24 hr care plan Caregiver Availability: Other (Comment) (Working on 24 hr care plan with many helpers for his wife) Family Dynamics: Close knit family both son's are involved and supportive. Both plan to assist when they can with Dad's care. Wife has freinds and church members who are willing to assist them.  Social History Preferred language: English Religion: Non-Denominational Cultural Background: No issues Education: Secretary/administrator educated Read: Yes Write: Yes Employment Status: Retired Date Retired/Disabled/Unemployed: Film/video editor Issues: No issues Guardian/Conservator: None-according to MD pt is not capable of making his own decisions while here. Will look toward his wife and son's if any decision needs to be made while here.   Abuse/Neglect Physical Abuse: Denies Verbal Abuse: Denies Sexual Abuse: Denies Exploitation of patient/patient's resources: Denies Self-Neglect: Denies  Emotional Status Pt's affect, behavior adn adjustment status: Pt is motivated and will work hard in therapies. He has been through his BKA and learned to walk again so he feels this will be tougher but he can do it. Son and wife are here and report he is stubborn enough to do it. Recent Psychosocial Issues: other health issues Pyschiatric History: No issues deferred depression screen due to pt is still adjusting to rehab. He would benefit from seeing neuro-psych sometime throughout his  stay. He is dealing with multiple health issues.  Substance Abuse History: No issues  Patient / Family Perceptions, Expectations & Goals Pt/Family understanding of illness & functional limitations: Pt, wife and son can explain his stroke and deficits. He seems to be taking it in stride. He is optimistic and will push to make progress here. His wife and son have spoken with the MD and feel they have a good understanding of his treatment plan. Premorbid pt/family roles/activities: Husband, father, grandfather, retiree, amputee, church member, etc Anticipated changes in roles/activities/participation: resume Pt/family expectations/goals: Pt states: " I want to get back to walking and taking care of myself."  Wife states: " I helped him some but he did it all."  Son states: " I will do waht I can but live four hours away."  [employer]: Other (Comment) (had in past) Premorbid Home Care/DME Agencies: Other (Comment) (had in past) Transportation available at discharge: Wife and family Resource referrals recommended: Neuropsychology, Support group (specify)  Discharge Planning Living Arrangements: Spouse/significant other Support Systems: Children, Spouse/significant other, Other relatives, Friends/neighbors, Social worker community Type of Residence: Private residence Insurance Resources: [employer], Multimedia programmer (specify) ([employer]) Museum/gallery curator Resources: [place] Referred: No Living Expenses: Own Money Management: Spouse, Patient Does the patient have any problems obtaining your medications?: No Home Management: Wife would do the home management Patient/Family Preliminary Plans: Return home wiht  wife and others pulling together to assist him and her with him. All aware he will need 24 hr care at discharge from rehab and trying to begin planning now. Wife  and son here to observe in therapies today. Social Work Anticipated Follow Up  Needs: HH/OP, Support Group  Clinical Impression Pleasant gentleman who is motivated and willing to work hard to regain and recover from this stroke. Wife and son are here to observe for his evaluation day, they realize it will  a long road and are working on a  discharge plan for 24 hr care. Discussed team conference Wed, wife plans to be here daily and attend therapies with pt. MD is working on his hiccups and trying to get rid of them. Will work with on a realistic Discharge plan.  [name] 10/31/2016, 12:25 PM

## 2016-10-31 NOTE — Evaluation (Signed)
Speech Language Pathology Assessment and Plan  Patient Details  Name: [name] Cook MRN: [id number] Date of Birth: 09-27-1941  SLP Diagnosis: Dysarthria;Cognitive Impairments;Dysphagia  Rehab Potential: Good ELOS: 3 weeks    Today's Date: 10/31/2016 SLP Individual Time: 5701-7793 SLP Individual Time Calculation (min): 60 min    Problem List:  Patient Active Problem List   Diagnosis Date Noted  . Right basal ganglia embolic stroke ([place]) 90/30/0923  . Cerebral infarction due to stenosis of right carotid artery ([place])   . Gastroesophageal reflux disease without esophagitis   . S/P BKA (below knee amputation) unilateral, right ([place])   . Benign essential HTN   . Hiccups   . 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 Cerebral infarction ([place])   . Hypertensive crisis   . Acute lower UTI   . Stenosis of carotid artery   . Prediabetes   . CVA (cerebral vascular accident) ([place]) 10/28/2016  . Bilateral carotid artery stenosis   . Left-sided weakness 10/27/2016  . Ischemic stroke ([place]) 10/27/2016  . Acute ischemic stroke ([place]) 10/27/2016  . NSTEMI (non-ST elevated myocardial infarction) ([place]) 10/12/2014  . Hx of right BKA ([place]) 10/12/2014  . 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 Atherosclerosis of native arteries of extremity with intermittent claudication ([place]) 12/08/2013  . Diabetes mellitus with circulatory complication ([place])   . Essential hypertension, benign 07/17/2013  . Hypokalemia 07/17/2013  . Non-ST elevation myocardial infarction (NSTEMI), initial episode of care ([place]) 07/16/2013  . GERD (gastroesophageal reflux disease) 03/15/2013  . Peripheral vascular disease ([place]) 03/03/2013  . CAROTID BRUIT 02/06/2011  . Dyslipidemia 10/31/2009  . Essential hypertension 10/31/2009  . Coronary atherosclerosis 10/31/2009   Past Medical History:  Past Medical History:  Diagnosis Date  . Arthritis   .  CAD (coronary artery disease)    2014 75% left main stenosis, LAD 80% followed by a mid subtotal stenosis, circumflex 90% stenosis, right coronary artery occluded, SVG to RCA occluded, SVG ramus occluded, SVG to OM patent without a sequential anastomosis noted. Graft had a focal 99% stenosis which was stented. 3.5 x 16 mm premier. b. cath 10/13/2014 DES to SVG to OM overlap previous stent  . Cholelithiasis 2014  . Cogan syndrome   . Colon polyps   . Diabetes mellitus without complication ([place])   . Dyslipidemia   . Gallstones   . GERD (gastroesophageal reflux disease)   . Hepatic hemangioma 2014  . Hx of right BKA ([place])    a. 2/2 necrotizing fasciitis.  . Hypertension   . Kidney stones   . Myocardial infarction 1999  . Necrotizing fasciitis ([place])   . Peptic ulcer disease   . Peripheral vascular disease [facility])    Past Surgical History:  Past Surgical History:  Procedure Laterality Date  . APPENDECTOMY  2007  . CORONARY ARTERY BYPASS GRAFT  1999   LIMA to the LAD, saphenous vein graft to obtuse marginal 1 and obtuse marginal 2, spahenous vein graft to intermediate, spahenous vein graft to the right coronary artery in 1999  . LEFT HEART CATHETERIZATION WITH CORONARY ANGIOGRAM N/A 10/13/2014   Procedure: LEFT HEART CATHETERIZATION WITH CORONARY ANGIOGRAM;  Surgeon: Peter M Martinique, MD;  Location[facility] CATH LAB;  Service: Cardiovascular;  Laterality: N/A;  . LEFT HEART CATHETERIZATION WITH CORONARY/GRAFT ANGIOGRAM N/A 07/19/2013   Procedure: LEFT HEART CATHETERIZATION WITH Beatrix Fetters;  Surgeon: Peter M Martinique, MD;  Location: [facility] C[facility] LAB;  Service: Cardiovascular;  Laterality: N/A;  . LEG AMPUTATION  1999   Right BKA  . PERCUTANEOUS CORONARY STENT INTERVENTION (PCI-S)  07/19/2013   Procedure: PERCUTANEOUS CORONARY STENT INTERVENTION (PCI-S);  Surgeon: Peter M Martinique, MD;  Location: [facility] C[facility] LAB;  Service: Cardiovascular;;    Assessment / Plan / Recommendation Clinical Impression  Lazarus Summerlinis a 75 y.o.right handed malewith history of CAD status post CABG 1999, hypertension, right BKA secondary to necrotizing fasciitis following right leg vein harvest for his CABG. History taken from patient and family. Friends at bedside as well. Patient is wheelchair bound--requires supervision for sit to stand transfers and able to ambulate occasionally. He was admitted on 10/27/16 with new onset right sided weakness, fall and inability to stand. NIHSS score 6 and CT head without acute abnormality. MRI brain done revealing 2 acute infarcts in the right lateral lenticulostriate distribution. CT angiogram head and neck negative for emergent large vessel occlusion with pseudoaneurysm of the proximal brachiocephalic artery, moderate to severe tandem stenosis of the right MCA mid M1 segment and at the right MCA bifurcation as well as high-grade radiographic string sign stenosis of the left ICA.Dr. Donnetta Hutching consulted for input and did not recommend L-CEA at this time. 2D echo with EF 65-70%, moderate LVH, grade 2 diastolic dysfunction with mild to moderate AVR and mild MVR. Dr. Erlinda Hong recommends 30 day event monitor to work up cardioembolic stroke--to continue DAPT for 3 months followed by plavix alone and Long term BP goal 130-150 due to L-ICA stenosis (string sign). Patient with marked oral phase dysphagia with residue and silent penetration of nectars--MBS done 12/5 and patient on dysphagia 2, honey liquids. Patient with resultant dense left hemiparesis, dysphagia with difficulty handling oral secretions, cognitive deficits requiring verbal and tactile cues to follow one step commands and left neglect. Therapy ongoing and CIR recommended for follow up therapy.   Bedside Swallow  and Cognitive Linguistic Evaluations provided on 10/31/16 revealing moderate oropharyngeal dysphagia and moderate to severe cognitive linguistic deficits. Pt decreased sustained attention to task which impacts his oral phase,  marked increased oral holding futher compounded by left side facial weakness (increased residue in left buccal cavity). During this evaluation, pt also had severe hiccups which further interfered with swallow coordination. Pt with lengthy swallow delay across all consistencies. His attention deficits also impact auditory comprehension, preception of his enviornment on the left and he is moderately unintelligible at the phrase level due to imprecise articulation and decreased vocal intensity. These deficits impact the patient's overall safety with functional communication and self-care tasks. Pt would benefit from skilled SLP intervention in order to maximize her functional independence prior to discharge. Anticipate pt will require 24 hour supervision at home and follow up SLP services.    Skilled Therapeutic Interventions          Bedside swallow and cognitive-linguisiic evaluations completed with results and recommendations reviewed with pt, wife and son Ronalee Belts). Skilled dysphagia therapy focused on safe consumption of dysphagia 2 with direction supervision for use of compensatory strategies. Pt with inattention to bolus and difficulty initiating swallow. Max A multimodal cues needed to increase speech intelligibility at the phrase level ~<50%. Pt required Max A verbal cues to attention to left side of his environment.    SLP Assessment  Patient will need skilled Speech Lanaguage Pathology Services during CIR admission    Recommendations  SLP Diet Recommendations: Dysphagia 2 (Fine chop);Honey Liquid Administration via: Spoon;Cup Medication Administration: Crushed with puree Supervision: Staff to assist with self feeding;Full supervision/cueing for  compensatory strategies Compensations: Slow rate;Small sips/bites;Minimize environmental distractions;Lingual sweep for clearance of pocketing;Multiple dry swallows after each bite/sip Postural Changes and/or Swallow Maneuvers: Seated upright 90  degrees;Upright 30-60 min after meal Oral Care Recommendations: Oral care BID Patient destination: [place] (SNF) Follow up Recommendations: Skilled Nursing facility;24 hour supervision/assistance Equipment Recommended: To be determined    SLP Frequency 3 to 5 out of 7 days   SLP Duration  SLP Intensity  SLP Treatment/Interventions 3 weeks  Minumum of 1-2 x/day, 30 to 90 minutes  Cognitive remediation/compensation;Dysphagia/aspiration precaution training;Internal/external aids;Functional tasks;Patient/family education;Therapeutic Exercise;Therapeutic Activities;Environmental controls    Pain Pain Assessment Pain Assessment: No/denies pain  Prior Functioning Cognitive/Linguistic Baseline: Within functional limits Type of Home: House  Lives With: Spouse Available Help at Discharge: Family;Available PRN/intermittently Vocation: Retired  Function:  Eating Eating   Modified Consistency Diet: Yes Eating Assist Level: Helper feeds patient           Cognition Comprehension Comprehension assist level: Understands basic 50 - 74% of the time/ requires cueing 25 - 49% of the time  Expression   Expression assist level: Expresses basic 50 - 74% of the time/requires cueing 25 - 49% of the time. Needs to repeat parts of sentences.  Social Interaction Social Interaction assist level: Interacts appropriately 75 - 89% of the time - Needs redirection for appropriate language or to initiate interaction.  Problem Solving Problem solving assist level: Solves basic 25 - 49% of the time - needs direction more than half the time to initiate, plan or complete simple activities  Memory Memory assist level: Recognizes or recalls 25 - 49% of the time/requires cueing 50 - 75% of the time   Short Term Goals: Week 1: SLP Short Term Goal 1 (Week 1): Pt will conssitently demonstrate orientation x 4 with Min A verbal cues.  SLP Short Term Goal 2 (Week 1): Pt will attend to basic,  familiar tasks for ~5 minutes intervals with Mod A verbal cues for redirection.  SLP Short Term Goal 3 (Week 1): Pt will attend to left field of environment during functional tasks with Mod A verbal cues.  SLP Short Term Goal 4 (Week 1): Pt will consume current diet without overt s/s of aspiration with Mod A verbal cues for use of swallowing compensatory strategies.  SLP Short Term Goal 5 (Week 1): Pt will consume trials of nectar thick liquids without overt s/s of aspiration with Mod A verbal cues .  SLP Short Term Goal 6 (Week 1): Pt will utilize speech intelligibility strategies at the pharse level with Max A verbal cues to achieve 75% intelligibility.   Refer to Care Plan for Long Term Goals  Recommendations for other services: None   Discharge Criteria: Patient will be discharged from SLP if patient refuses treatment 3 consecutive times without medical reason, if treatment goals not met, if there is a change in medical status, if patient makes no progress towards goals or if patient is discharged from hospital.  The above assessment, treatment plan, treatment alternatives and goals were discussed and mutually agreed upon: by patient and by family  Kyndal Gloster 10/31/2016, 1:20 PM

## 2016-10-31 NOTE — Progress Notes (Addendum)
[facility] PHYSICAL MEDICINE & REHABILITATION     PROGRESS NOTE    Subjective/Complaints: No problems over night. Slept well. Just waking up when I arrived.   ROS: pt denies nausea, vomiting, diarrhea, cough, shortness of breath or chest pain   Objective: Vital Signs: Blood pressure (!) 176/71, pulse 96, temperature 98.2 F (36.8 C), temperature source Oral, resp. rate 17, height 5\' 8"  (1.727 m), weight 90.4 kg (199 lb 6.4 oz), SpO2 92 %. No results found.  Recent Labs  10/31/16 0526  WBC 10.3  HGB 13.6  HCT 40.1  PLT 230    Recent Labs  10/30/16 0815 10/31/16 0526  NA 141 141  K 3.5 3.5  CL 104 105  GLUCOSE 127* 152*  BUN 12 16  CREATININE 0.96 0.99  CALCIUM 8.8* 9.0   CBG (last 3)   Recent Labs  10/30/16 1202 10/30/16 2050 10/31/16 0657  GLUCAP 137* 125* 138*    Wt Readings from Last 3 Encounters:  10/30/16 90.4 kg (199 lb 6.4 oz)  10/28/16 92 kg (202 lb 12.8 oz)  09/20/16 95.8 kg (211 lb 4 oz)    Physical Exam:  Constitutional: just waking up. A little slow to arouse   HENT:  Head: Normocephalic and atraumatic.  Mouth/Throat: Oropharynx is clear and moist.  Eyes: Conjunctivae and EOM are normal.  Neck: supple.  Cardiovascular: RRR, +murmur Respiratory: clear GI: Soft. Bowel sounds are normal. There is no tenderness.  Musculoskeletal: He exhibits edema (tr edema left hand. ). He exhibits no tenderness.  RLE BKA noted. Well formed Neurological: He is alert.  A&Ox3 with cues for date Right gaze preference with left inattention.  Mild dysarthria persists Able to follow simple one and two step commands without difficulty.  Left facial weakness Sensation intact to light touch throughout Motor: LUE/LLE: 0/5 persistent with emerging extensor tone LLE. RUE: 5/5 proximal to distal RLE: HF 5/5   Skin: Skin is warm and dry and intact.  Psychiatric: flat.    Assessment/Plan: 1. Functional deficits secondary to right basal ganglia infarct which  require 3+ hours per day of interdisciplinary therapy in a comprehensive inpatient rehab setting. Physiatrist is providing close team supervision and 24 hour management of active medical problems listed below. Physiatrist and rehab team continue to assess barriers to discharge/monitor patient progress toward functional and medical goals.  Function:  Bathing Bathing position      Bathing parts      Bathing assist        Upper Body Dressing/Undressing Upper body dressing                    Upper body assist        Lower Body Dressing/Undressing Lower body dressing                                  Lower body assist        Toileting Toileting          Toileting assist     Transfers Chair/bed transfer             Locomotion Ambulation           Wheelchair          Cognition Comprehension Comprehension assist level: Understands complex 90% of the time/cues 10% of the time  Expression Expression assist level: Expresses complex 90% of the time/cues < 10% of the time  Social  Interaction Social Interaction assist level: Interacts appropriately 90% of the time - Needs monitoring or encouragement for participation or interaction.  Problem Solving Problem solving assist level: Solves complex 90% of the time/cues < 10% of the time  Memory Memory assist level: Recognizes or recalls 90% of the time/requires cueing < 10% of the time   Medical Problem List and Plan: 1.  Left hemiplegia, dysarthria, dysphagia secondary to right basal ganglia infarct with history of right BKA  -begin CIR therapies. 2.  DVT Prophylaxis/Anticoagulation: Pharmaceutical: Lovenox 3. Pain Management: tylenol prn.continue to educate patient family on LUE positioning.  4. Mood: LCSW to follow for evaluation and support.  5. Neuropsych: This patient is not fully capable of making decisions on his own behalf. 6. Skin/Wound Care: routine pressure relief measures 7.  Fluids/Electrolytes/Nutrition: Monitor I/O.   -I personally reviewed the patient's labs today.   -. Monitor hydration on honey luqids- concerns of adequate intake with ongoing hiccups and coughing due to difficulty with secretions. (BUN/Cr stable today) 8. HTN: Monitor BP tid-- Goal of BP 130-150--avoid hypotension 9. 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  11. Hiccups: Baclofen prn for now. Haldol not fully effective--was used in the past and took a few days for resolution 12. GERD: On Zantac bid. 13. Pre-diabtes: Hgb A1c- 5.9.  LOS (Days) 1 A FACE TO FACE EVALUATION WAS PERFORMED  Elberta Lachapelle T 10/31/2016 9:48 AM

## 2016-10-31 NOTE — Care Management Note (Signed)
Inpatient [facility] Individual Statement of Services  Patient Name:  [name] Cook  Date:  10/31/2016  Welcome to the [facility].  Our goal is to provide you with an individualized program based on your diagnosis and situation, designed to meet your specific needs.  With this comprehensive rehabilitation program, you will be expected to participate in at least 3 hours of rehabilitation therapies Monday-Friday, with modified therapy programming on the weekends.  Your rehabilitation program will include the following services:  Physical Therapy (PT), Occupational Therapy (OT), Speech Therapy (ST), 24 hour per day rehabilitation nursing, Therapeutic Recreaction (TR), Neuropsychology, Case Management (Social Worker), Rehabilitation Medicine, Nutrition Services and Pharmacy Services  Weekly team conferences will be held on Wednesday to discuss your progress.  Your Social Worker will talk with you frequently to get your input and to update you on team discussions.  Team conferences with you and your family in attendance may also be held.  Expected length of stay: 3-4 weeks  Overall anticipated outcome: mod assist wheelchair level  Depending on your progress and recovery, your program may change. Your Social Worker will coordinate services and will keep you informed of any changes. Your Social Worker's name and contact numbers are listed  below.  The following services may also be recommended but are not provided by the [facility]:    [place] will be made to provide these services after discharge if needed.  Arrangements include referral to agencies that provide these services.  Your insurance has been verified to be:  [facility] primary doctor is:  Alain Honey  Pertinent information will be shared with your doctor and your insurance  company.  Social Worker:  Ovidio Kin, [place] or ([phone]  Information discussed with and copy given to patient by: [name], 10/31/2016, 11:59 AM

## 2016-10-31 NOTE — Evaluation (Signed)
Occupational Therapy Assessment and Plan  Patient Details  Name: [name] Cook MRN: [id number] Date of Birth: 1941/09/05  OT Diagnosis: abnormal posture, cognitive deficits, hemiplegia affecting non-dominant side and muscle weakness (generalized) Rehab Potential: Rehab Potential (ACUTE ONLY): Good ELOS: 3-4 weeks   Today's Date: 10/31/2016 OT Individual Time: 1000-1110 OT Individual Time Calculation (min): 70 min      Problem List: Patient Active Problem List   Diagnosis Date Noted  . Right basal ganglia embolic stroke ([place]) 35/45/6256  . Cerebral infarction due to stenosis of right carotid artery ([place])   . Gastroesophageal reflux disease without esophagitis   . S/P BKA (below knee amputation) unilateral, right ([place])   . Benign essential HTN   . Hiccups   . 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 Cerebral infarction ([place])   . Hypertensive crisis   . Acute lower UTI   . Stenosis of carotid artery   . Prediabetes   . CVA (cerebral vascular accident) ([place]) 10/28/2016  . Bilateral carotid artery stenosis   . Left-sided weakness 10/27/2016  . Ischemic stroke ([place]) 10/27/2016  . Acute ischemic stroke ([place]) 10/27/2016  . NSTEMI (non-ST elevated myocardial infarction) ([place]) 10/12/2014  . Hx of right BKA ([place]) 10/12/2014  . 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 Atherosclerosis of native arteries of extremity with intermittent claudication ([place]) 12/08/2013  . Diabetes mellitus with circulatory complication ([place])   . Essential hypertension, benign 07/17/2013  . Hypokalemia 07/17/2013  . Non-ST elevation myocardial infarction (NSTEMI), initial episode of care ([place]) 07/16/2013  . GERD (gastroesophageal reflux disease) 03/15/2013  . Peripheral vascular disease ([place]) 03/03/2013  . CAROTID BRUIT 02/06/2011  . Dyslipidemia 10/31/2009  . Essential hypertension 10/31/2009  . Coronary atherosclerosis 10/31/2009     Past Medical History:  Past Medical History:  Diagnosis Date  . Arthritis   . CAD (coronary artery disease)    2014 75% left main stenosis, LAD 80% followed by a mid subtotal stenosis, circumflex 90% stenosis, right coronary artery occluded, SVG to RCA occluded, SVG ramus occluded, SVG to OM patent without a sequential anastomosis noted. Graft had a focal 99% stenosis which was stented. 3.5 x 16 mm premier. b. cath 10/13/2014 DES to SVG to OM overlap previous stent  . Cholelithiasis 2014  . Cogan syndrome   . Colon polyps   . Diabetes mellitus without complication ([place])   . Dyslipidemia   . Gallstones   . GERD (gastroesophageal reflux disease)   . Hepatic hemangioma 2014  . Hx of right BKA ([place])    a. 2/2 necrotizing fasciitis.  . Hypertension   . Kidney stones   . Myocardial infarction 1999  . Necrotizing fasciitis ([place])   . Peptic ulcer disease   . Peripheral vascular disease [facility])    Past Surgical History:  Past Surgical History:  Procedure Laterality Date  . APPENDECTOMY  2007  . CORONARY ARTERY BYPASS GRAFT  1999   LIMA to the LAD, saphenous vein graft to obtuse marginal 1 and obtuse marginal 2, spahenous vein graft to intermediate, spahenous vein graft to the right coronary artery in 1999  . LEFT HEART CATHETERIZATION WITH CORONARY ANGIOGRAM N/A 10/13/2014   Procedure: LEFT HEART CATHETERIZATION WITH CORONARY ANGIOGRAM;  Surgeon: Peter M Martinique, MD;  Location: [facility] CATH LAB;  Service: Cardiovascular;  Laterality: N/A;  . LEFT HEART CATHETERIZATION WITH CORONARY/GRAFT ANGIOGRAM N/A 07/19/2013   Procedure: LEFT HEART CATHETERIZATION WITH CORONARY/GRAFT ANGIOGRAM;  Surgeon: Peter M Martinique, MD;  Location: [facility] CATH LAB;  Service: Cardiovascular;  Laterality: N/A;  . LEG AMPUTATION  1999   Right BKA  . PERCUTANEOUS CORONARY STENT INTERVENTION (PCI-S)  07/19/2013   Procedure: PERCUTANEOUS CORONARY STENT INTERVENTION (PCI-S);  Surgeon: Peter M Martinique, MD;  Location: [facility] CATH LAB;   Service: Cardiovascular;;    Assessment & Plan Clinical Impression: Patient is a 75 y.o. 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 History taken from patient and family.  Friends at bedside as well. Patient is wheelchair bound--requires supervision for sit to stand transfers and able to ambulate occasionally. He was admitted on 10/27/16 with new onset right sided weakness, fall and inability to stand. NIHSS score 6 and CT head without acute abnormality. MRI brain done revealing 2 acute infarcts in the right lateral lenticulostriate distribution. CT angiogram head and neck negative for emergent large vessel occlusion with pseudoaneurysm of the proximal brachiocephalic artery, moderate to severe tandem stenosis of the right MCA mid M1 segment and at the right MCA bifurcation as well as high-grade radiographic string sign stenosis of the left ICA.Dr. Donnetta Hutching consulted for input and did not recommend L-CEA at this time. 2D echo with EF 65-70%, moderate LVH, grade 2 diastolic dysfunction with mild to moderate AVR and mild MVR. Dr. Erlinda Hong recommends 30 day event monitor to work up cardioembolic stroke--to continue DAPT for 3 months followed by plavix alone and Long term BP goal 130-150 due to L-ICA stenosis (string sign) .   Patient with marked oral phase dysphagia with residue and silent penetration of nectars--MBS done 12/5 and patient on dysphagia 2, honey liquids. Patient with resultant dense left hemiparesis, dysphagia with difficulty handling oral secretions, cognitive deficits requiring verbal and tactile cues to follow one step commands and left neglect.  Patient transferred to CIR on 10/30/2016 .    Patient currently requires total with basic self-care skills and basic mobility  secondary to muscle weakness, decreased cardiorespiratoy endurance, impaired timing and  sequencing, abnormal tone, unbalanced muscle activation, motor apraxia, decreased coordination and decreased motor planning, decreased visual perceptual skills, decreased midline orientation, decreased attention to left, left side neglect and decreased motor planning, decreased initiation, decreased attention, decreased awareness, decreased problem solving, decreased safety awareness, decreased memory and delayed processing and decreased sitting balance, decreased standing balance, decreased postural control, hemiplegia, decreased balance strategies and difficulty maintaining precautions.  Prior to hospitalization, patient could complete ADL with mod I to supervision   Patient will benefit from skilled intervention to decrease level of assist with basic self-care skills and increase independence with basic self-care skills prior to discharge home with care partner.  Anticipate patient will require moderate physical assestance and follow up home health.  OT - End of Session Activity Tolerance: Tolerates 10 - 20 min activity with multiple rests Endurance Deficit: Yes OT Assessment Rehab Potential (ACUTE ONLY): Good Barriers to Discharge: Decreased caregiver support Barriers to Discharge Comments: unsure if they have support to provide the physical A he may need at d/c OT Patient demonstrates impairments in the following area(s): Balance;Pain;Cognition;Safety;Edema;Endurance;Sensory;Skin Integrity;Motor OT Basic ADL's Functional Problem(s): Grooming;Bathing;Eating;Dressing;Toileting OT Transfers Functional Problem(s): Toilet;Tub/Shower OT Additional Impairment(s): Fuctional Use of Upper Extremity OT Plan OT Intensity: Minimum of 1-2 x/day, 45 to 90 minutes OT Frequency: 5 out of 7 days OT Duration/Estimated Length of Stay: 3-4 weeks OT Treatment/Interventions: Community reintegration;Balance/vestibular training;Disease mangement/prevention;Functional electrical  stimulation;Neuromuscular  re-education;Self Care/advanced ADL retraining;Skin care/wound managment;Psychosocial support;Pain management;Functional mobility training;DME/adaptive equipment instruction;Discharge planning;Cognitive remediation/compensation;Patient/family education;Splinting/orthotics;Therapeutic Exercise;UE/LE Coordination activities;Wheelchair propulsion/positioning;Visual/perceptual remediation/compensation;UE/LE Strength taining/ROM;Therapeutic Activities OT Self Feeding Anticipated Outcome(s): supervision/ setup  OT Basic Self-Care Anticipated Outcome(s): mod A OT Toileting Anticipated Outcome(s): mod A OT Bathroom Transfers Anticipated Outcome(s): mod A OT Recommendation Patient destination: Home Follow Up Recommendations: Home health OT Equipment Recommended: To be determined   Skilled Therapeutic Intervention 1:1 Ot eval initiated with Ot goals, purpose and role discussed with pt and pt's son and wife. Self care retraining to including bathing and dressing at bed and w/c level, transfer training and focus on sitting balance and appropriate w/c fitting for function and safety. Pt required max to total A for rolling side to side in bed for changing of soiled brief, and bathing and dressing LB (at total A). Education provided about safety with positioning of left UE during functional mobility. Pt came to EOB with total A with more than reasonable amt of time; coming off left side of bed.  Pt required max to total A to maintain static sitting balance at EOB. Pt unable to assist with donning prosthesis due to poor sitting balance and decr use of left UE; requiring total A. Pt transferred with slide board from bed to w/c ith total A with 2nd family member as stand by and assisting with holding the w/c. Pt unable to maintain sitting balance in standard one arm drive w/c. Pt unsafe to sit in this w/c in between therapies. Transferred from standard w/c to tilt in space w/c with lateral support for better fit. No active  movement detected in left LE during either transfer. Pt left in tilt in space w/c for lunch. Discussed with pt and family best positioning for feeding with precautions would be in a w/c for optimal positioning.    OT Evaluation Precautions/Restrictions  Precautions Precautions: Fall Precaution Comments: dense L hemi, old R BKA with prosthesis Required Braces or Orthoses: Sling Other Brace/Splint: sling for LUE after fall Restrictions Weight Bearing Restrictions: No General Chart Reviewed: Yes Family/Caregiver Present: Yes (wife and son)  Pain Pain Assessment Pain Assessment: No/denies pain Home Living/Prior Functioning Home Living Living Arrangements: Spouse/significant other Available Help at Discharge: Family, Available PRN/intermittently Type of Home: House Home Access: Stairs to enter Technical brewer of Steps: 2 Entrance Stairs-Rails: None Home Layout: One level Bathroom Shower/Tub: Multimedia programmer: Associate Professor Accessibility: No  Lives With: Spouse Prior Function Level of Independence: Independent with gait, Independent with basic ADLs, Independent with transfers, Requires assistive device for independence (prosthesis)  Able to Take Stairs?: Yes Driving: Yes 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, Public affairs consultant Leisure: Hobbies-yes (Comment) Comments: enjoys golfing, likes wood working and working on Sales promotion account executive ADL ADL ADL Comments: see functional navigator Vision/Perception  Vision- History Baseline Vision/History: Wears glasses Wears Glasses: At all times Vision- Assessment Additional Comments: continue to assess  Cognition Overall Cognitive Status: Impaired/Different from baseline Arousal/Alertness: Awake/alert Orientation Level: Person;Place;Situation Person: Oriented Place: Oriented Situation: Oriented Year: 2017 Month: November Day of Week: Correct Memory: Impaired Memory Impairment: Decreased  recall of new information Attention: Sustained Sustained Attention: Impaired Sustained Attention Impairment: Verbal basic;Functional basic Awareness: Impaired Awareness Impairment: Emergent impairment;Anticipatory impairment;Intellectual impairment Problem Solving: Impaired Problem Solving Impairment: Verbal basic;Functional basic Executive Function: Initiating;Decision Making;Self Monitoring;Self Correcting;Reasoning Reasoning: Impaired Reasoning Impairment: Verbal basic;Functional basic Decision Making: Impaired Decision Making Impairment: Verbal basic;Functional basic Initiating: Impaired Initiating Impairment: Verbal basic;Functional basic Self Monitoring: Impaired Self Monitoring Impairment: Verbal basic;Functional basic  Self Correcting: Impaired Self Correcting Impairment: Verbal basic;Functional basic Safety/Judgment: Impaired Sensation Sensation Light Touch: Impaired by gross assessment;Impaired Detail Light Touch Impaired Details: Impaired LUE;Impaired LLE Stereognosis: Not tested Hot/Cold: Appears Intact Proprioception: Impaired Detail Proprioception Impaired Details: Impaired LUE;Impaired LLE Coordination Gross Motor Movements are Fluid and Coordinated: No Fine Motor Movements are Fluid and Coordinated: No Coordination and Movement Description: dense L hemi  Motor  Motor Motor: Hemiplegia;Abnormal tone;Abnormal postural alignment and control Mobility  Bed Mobility Bed Mobility: Sit to Supine Sit to Supine: 1: +2 Total assist Sit to Supine - Details (indicate cue type and reason): Maximove Transfers Transfers: Sit to Stand;Stand to Sit Sit to Stand: 1: +2 Total assist  Trunk/Postural Assessment  Cervical Assessment Cervical Assessment: Exceptions to [facility] (rotated to the right) Thoracic Assessment Thoracic Assessment:  (rounded shoulders) Lumbar Assessment Lumbar Assessment:  (posterior pelvic tilt) Postural Control Postural Control: Deficits on  evaluation Righting Reactions: absent  Balance Dynamic Sitting Balance Sitting balance - Comments: loses balance posterior/ anterior and to the left, especially with decr attention; Extremity/Trunk Assessment RUE Assessment RUE Assessment: Within Functional Limits LUE Assessment LUE Assessment: Exceptions to [facility] LUE Strength LUE Overall Strength: Deficits LUE Overall Strength Comments: grossly 0/5, flaccid; brunstrom 1   See Function Navigator for Current Functional Status.   Refer to Care Plan for Long Term Goals  Recommendations for other services: none at this time   Discharge Criteria: Patient will be discharged from OT if patient refuses treatment 3 consecutive times without medical reason, if treatment goals not met, if there is a change in medical status, if patient makes no progress towards goals or if patient is discharged from hospital.  The above assessment, treatment plan, treatment alternatives and goals were discussed and mutually agreed upon: by patient and by family  Nicoletta Ba 10/31/2016, 7:29 PM

## 2016-10-31 NOTE — Progress Notes (Signed)
Pt had an episode of emesis. Color: Green and thick with saliva. Pt not sure if he was throwing up or if it was phlegm. Pt stated he has been trying to get it up all during the day and couldn't. No other issues since that time.

## 2016-10-31 NOTE — Progress Notes (Signed)
Patient information reviewed and entered into eRehab system by Delshawn Stech, RN, CRRN, PPS Coordinator.  Information including medical coding and functional independence measure will be reviewed and updated through discharge.     Per nursing patient was given "Data Collection Information Summary for Patients in Inpatient Rehabilitation Facilities with attached "Privacy Act Statement-Health Care Records" upon admission.  

## 2016-11-01 ENCOUNTER — Inpatient Hospital Stay (HOSPITAL_COMMUNITY): Payer: Medicare Other | Admitting: Physical Therapy

## 2016-11-01 ENCOUNTER — Inpatient Hospital Stay (HOSPITAL_COMMUNITY): Payer: Medicare Other | Admitting: Speech Pathology

## 2016-11-01 ENCOUNTER — Inpatient Hospital Stay (HOSPITAL_COMMUNITY): Payer: Medicare Other | Admitting: Occupational Therapy

## 2016-11-01 DIAGNOSIS — I639 Cerebral infarction, unspecified: Secondary | ICD-10-CM

## 2016-11-01 DIAGNOSIS — I6529 Occlusion and stenosis of unspecified carotid artery: Secondary | ICD-10-CM

## 2016-11-01 DIAGNOSIS — G8102 Flaccid hemiplegia affecting left dominant side: Secondary | ICD-10-CM

## 2016-11-01 DIAGNOSIS — R7303 Prediabetes: Secondary | ICD-10-CM

## 2016-11-01 DIAGNOSIS — R066 Hiccough: Secondary | ICD-10-CM

## 2016-11-01 DIAGNOSIS — I10 Essential (primary) hypertension: Secondary | ICD-10-CM

## 2016-11-01 LAB — GLUCOSE, CAPILLARY
Glucose-Capillary: 137 mg/dL — ABNORMAL HIGH (ref 65–99)
Glucose-Capillary: 151 mg/dL — ABNORMAL HIGH (ref 65–99)
Glucose-Capillary: 118 mg/dL — ABNORMAL HIGH (ref 65–99)
Glucose-Capillary: 182 mg/dL — ABNORMAL HIGH (ref 65–99)

## 2016-11-01 MED ORDER — FLUOXETINE HCL 10 MG PO CAPS
10.0000 mg | ORAL_CAPSULE | Freq: Every day | ORAL | Status: DC
  Administered 2016-11-01 – 2016-11-11 (×11): 10 mg via ORAL
  Filled 2016-11-01 (×11): qty 1

## 2016-11-01 NOTE — Progress Notes (Signed)
Physical Therapy Note  Patient Details  Name: [name] Cook MRN: [id number] Date of Birth: Mar 16, 1941 Today's Date: 11/01/2016    Time: 1115-1130 15 minutes (Make up time)  1:1 No c/o pain.  Session focused on NMR for Lt LE with tapping and quick stretch to promote movement. Pt still with no palpable mm activity in Lt quad or hamstring.   Fleetwood Pierron 11/01/2016, 11:37 AM

## 2016-11-01 NOTE — Progress Notes (Signed)
[facility] PHYSICAL MEDICINE & REHABILITATION     PROGRESS NOTE    Subjective/Complaints: Patient seen lying in bed this morning. He states he slept well overnight. He notes he had a good first in therapy yesterday.  ROS: +Hiccups. Denies CP, SOB, nausea, vomiting, diarrhea.  Objective: Vital Signs: Blood pressure (!) 139/99, pulse 84, temperature 98.6 F (37 C), temperature source Oral, resp. rate 18, height 5\' 8"  (1.727 m), weight 90.4 kg (199 lb 6.4 oz), SpO2 93 %. No results found.  Recent Labs  10/31/16 0526  WBC 10.3  HGB 13.6  HCT 40.1  PLT 230    Recent Labs  10/30/16 0815 10/31/16 0526  NA 141 141  K 3.5 3.5  CL 104 105  GLUCOSE 127* 152*  BUN 12 16  CREATININE 0.96 0.99  CALCIUM 8.8* 9.0   CBG (last 3)   Recent Labs  10/31/16 2058 11/01/16 0712 11/01/16 1142  GLUCAP 145* 137* 151*    Wt Readings from Last 3 Encounters:  10/30/16 90.4 kg (199 lb 6.4 oz)  10/28/16 92 kg (202 lb 12.8 oz)  09/20/16 95.8 kg (211 lb 4 oz)    Physical Exam:  Constitutional: NAD. Vital signs reviewed.   HENT: Normocephalic and atraumatic.  Eyes: EOM are normal. No discharge.  Cardiovascular: RRR, +murmur. No JVD. Respiratory: clear GI: Soft. Bowel sounds are normal. Musculoskeletal: He exhibits edema (tr edema left hand. ). He exhibits no tenderness.  RLE BKA  Neurological: He is alert.  A&Ox3 with cues Right gaze preference with left inattention (improving).  Mild dysarthria Left facial weakness Motor: LUE/LLE: 0/5 with emerging extensor tone LLE. RUE: 5/5 proximal to distal RLE: HF 5/5   Skin: Skin is warm and dry and intact.  Psychiatric: flat.    Assessment/Plan: 1. Functional deficits secondary to right basal ganglia infarct which require 3+ hours per day of interdisciplinary therapy in a comprehensive inpatient rehab setting. Physiatrist is providing close team supervision and 24 hour management of active medical problems listed below. Physiatrist  and rehab team continue to assess barriers to discharge/monitor patient progress toward functional and medical goals.  Function:  Bathing Bathing position   Position: Bed  Bathing parts Body parts bathed by patient: Left arm, Chest, Abdomen Body parts bathed by helper: Right arm, Front perineal area, Buttocks, Right upper leg, Left upper leg, Right lower leg, Left lower leg, Back  Bathing assist Assist Level: Touching or steadying assistance(Pt > 75%)      Upper Body Dressing/Undressing Upper body dressing   What is the patient wearing?: Pull over shirt/dress     Pull over shirt/dress - Perfomed by patient: Thread/unthread right sleeve, Put head through opening Pull over shirt/dress - Perfomed by helper: Thread/unthread left sleeve, Pull shirt over trunk        Upper body assist Assist Level: 2 helpers      Lower Body Dressing/Undressing Lower body dressing   What is the patient wearing?: Pants, Socks, Shoes, AFO (prosthesis)       Pants- Performed by helper: Thread/unthread right pants leg, Thread/unthread left pants leg, Pull pants up/down       Socks - Performed by helper: Don/doff left sock   Shoes - Performed by helper: Don/doff left shoe, Fasten left   AFO - Performed by helper: Don/doff right AFO      Lower body assist Assist for lower body dressing: 2 Helpers      Toileting Toileting Toileting activity did not occur: No continent bowel/bladder event  Toileting assist     Transfers Chair/bed transfer   Chair/bed transfer method: Lateral scoot Chair/bed transfer assist level: 2 helpers Chair/bed transfer assistive device: Sliding board, Prosthesis, Armrests Mechanical lift: Maximove   Locomotion Ambulation Ambulation activity did not occur: Safety/medical concerns         Wheelchair       Assist Level: Dependent (Pt equals 0%)  Cognition Comprehension Comprehension assist level: Understands basic 50 - 74% of the time/ requires cueing  25 - 49% of the time  Expression Expression assist level: Expresses basic 75 - 89% of the time/requires cueing 10 - 24% of the time. Needs helper to occlude trach/needs to repeat words.  Social Interaction Social Interaction assist level: Interacts appropriately 90% of the time - Needs monitoring or encouragement for participation or interaction.  Problem Solving Problem solving assist level: Solves basic 25 - 49% of the time - needs direction more than half the time to initiate, plan or complete simple activities  Memory Memory assist level: Recognizes or recalls 25 - 49% of the time/requires cueing 50 - 75% of the time   Medical Problem List and Plan: 1.  Left hemiplegia, dysarthria, dysphagia secondary to right basal ganglia infarct with history of right BKA  Continue CIR.  Will order bracing  Fluoxetine started 12/8 2.  DVT Prophylaxis/Anticoagulation: Pharmaceutical: Lovenox 3. Pain Management: tylenol prn. Continue to educate patient family on LUE positioning.  4. Mood: LCSW to follow for evaluation and support.  5. Neuropsych: This patient is not fully capable of making decisions on his own behalf. 6. Skin/Wound Care: routine pressure relief measures 7. Fluids/Electrolytes/Nutrition: Monitor I/O.   Monitor hydration on honey liquids- concerns of adequate intake with ongoing hiccups and coughing due to difficulty with secretions.  8. HTN: Monitor BP. Goal of BP 130-150--avoid hypotension  In reference range at present  9. 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  11. Hiccups: Baclofen prn for now. Haldol not fully effective--was used in the past and took a few days for resolution 12. GERD: On Zantac bid. 13. Pre-diabtes: Hgb A1c- 5.9.  Relatively controlled at present  Continue to monitor   LOS (Days) 2 A FACE TO FACE EVALUATION WAS PERFORMED  Ankit Lorie Phenix 11/01/2016 1:24 PM

## 2016-11-01 NOTE — Progress Notes (Signed)
Physical Therapy Session Note  Patient Details  Name: [name] Cook MRN: [id number] Date of Birth: 12-Nov-1941  Today's Date: 11/01/2016 PT Individual Time: 1000-1100 PT Individual Time Calculation (min): 60 min    Short Term Goals: Week 1:  PT Short Term Goal 1 (Week 1): Patient will perform bed mobility with max A.  PT Short Term Goal 2 (Week 1): Patient will perform transfers with assist of 1 person.  PT Short Term Goal 3 (Week 1): Patient will maintain static sitting balance with min A x 5 min.  PT Short Term Goal 4 (Week 1): Patient will initiate wheelchair propulsion.   Skilled Therapeutic Interventions/Progress Updates:    Session focused on sitting balance, standing tolerance, bed mobility and transfers, L LE NMR, L attention, and activity tolerance. Patient in bed with wife present to observe session reporting fatigue after eating breakfast. RN present to administer meds with patient sitting upright in bed with greatly increased time and cues to adhere to swallow precautions. PT donned R shrinker and 4 socks and patient came to sitting EOB with HOB raised and use of rail with total A. Patient sat EOB with max-total A for static sitting balance to don R prosthesis and pants with total A. UB dressing sitting EOB with max-total A for balance. Performed sit <> stand from EOB x 2 with BUE around therapist and rehab tech to lock in prosthesis and pull up pants. Patient required max-total A x 2 for static standing balance with max multimodal cues for upright posture due to trunk and hip flexion. Performed slide board transfer to [place] wheelchair with max A x 2 to R with increased time and patient assisting with RUE on arm rest. In gym, utilized standing frame x 5 min with focus on LL NMR, keeping head and trunk at midline, upright posture, and increasing L attention. Patient left in [place] wheelchair with wife in room.   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   Therapy/Group: Individual Therapy  Laretta Alstrom 11/01/2016, 12:33 PM

## 2016-11-01 NOTE — Progress Notes (Signed)
Orthopedic Tech Progress Note Patient Details:  [name] Cook 06/20/1941 [id number]  Patient ID: [name] Cook, male   DOB: May 14, 1941, 75 y.o.   MRN: [id number]   [name] Cook 11/01/2016, 2:23 PMCalled [facility] for left Prafo, resting hand splint and wrist cockup splint.

## 2016-11-01 NOTE — Progress Notes (Signed)
Occupational Therapy Session Note  Patient Details  Name: [name] Cook MRN: [id number] Date of Birth: 01-20-1941  Today's Date: 11/01/2016 OT Individual Time: 1130-1200 (make up time) OT Individual Time Calculation (min): 30 min     Short Term Goals: Week 1:  OT Short Term Goal 1 (Week 1): Pt will maintan sitting balance with min A during simple ADL task OT Short Term Goal 2 (Week 1): Pt will transfer with mod A +2 with LRAD bed/ chair in prep for ADL task OT Short Term Goal 3 (Week 1): Pt will demonstrate safe management of left UE with bed mobility with mod cuing  OT Short Term Goal 4 (Week 1): Pt will roll for LB clothing management with mod A with bed rails   Skilled Therapeutic Interventions/Progress Updates:    Pt seen for 30 mins make up session with focus on Lt attention and education on LUE NMR.  Pt received upright in w/c having just finished PT session.  Engaged in PROM of LUE in all ranges, with no reports of pain, full range, and no subluxation.  Educated on proper positioning when in bed and seated in w/c to decrease risk of subluxation and/or pain.  Engaged in oral care in sitting at sink with focus on safe swallow strategies with recommendation of no swallowing water due to honey thick recommendations at this time.  Provided pt and pt's wife with self-ROM handout and demonstrated shoulder flexion/extension, elbow flexion/extension, and supination/pronation with each able to demonstrate with min cues.  Left upright in w/c awaiting lunch tray.  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   Therapy/Group: Individual Therapy  [name] 11/01/2016, 3:39 PM

## 2016-11-01 NOTE — IPOC Note (Signed)
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 ([place])   Occlusion and stenosis of carotid artery     Functional Problem List: Nursing Bladder, Bowel, Medication Management, Safety, Pain, Skin Integrity  PT Balance, Behavior, Endurance, Motor, Nutrition, Edema, Pain, Perception, Safety, Sensory  OT Balance, Pain, Cognition, Safety, Edema, Endurance, Sensory, Skin Integrity, Motor  SLP Cognition, Perception, Safety  TR         Basic ADL's: OT Grooming, Bathing, Eating, Dressing, Toileting     Advanced  ADL's: OT       Transfers: PT Bed Mobility, Bed to Chair, Car, Manufacturing systems engineer, Metallurgist: PT Emergency planning/management officer, Ambulation, Stairs     Additional Impairments: OT Fuctional Use of Upper Extremity  SLP Swallowing      TR      Anticipated Outcomes Item Anticipated Outcome  Self Feeding supervision/ setup   Swallowing  Mod A   Basic self-care  mod A  Toileting  mod A   Bathroom Transfers mod A  Bowel/Bladder  Continent of bowel and bladder with supervision assistance  Transfers  mod A  Locomotion  mod A wheelchair level  Communication  Min A  Cognition  Mod A  Pain  <3   Safety/Judgment  Supervision   Therapy Plan: PT Intensity: Minimum of 1-2 x/day ,45 to 90 minutes PT Frequency: 5 out of 7 days PT Duration Estimated Length of Stay: 3-4 weeks OT Intensity: Minimum of 1-2 x/day, 45 to 90 minutes OT Frequency: 5 out of 7 days OT Duration/Estimated Length of Stay: 3-4 weeks SLP Intensity: Minumum of 1-2 x/day, 30 to 90 minutes SLP Frequency: 3 to 5 out of 7 days SLP Duration/Estimated Length of Stay: 3 weeks       Team Interventions: Nursing Interventions Patient/Family Education, Bladder Management,  Bowel Management, Disease Management/Prevention, Pain Management, Medication Management, Skin Care/Wound Management  PT interventions Ambulation/gait training, Balance/vestibular training, Cognitive remediation/compensation, Community reintegration, Discharge planning, Disease management/prevention, DME/adaptive equipment instruction, Functional electrical stimulation, Functional mobility training, Neuromuscular re-education, Pain management, Patient/family education, Psychosocial support, Splinting/orthotics, Stair training, Therapeutic Activities, Therapeutic Exercise, UE/LE Strength taining/ROM, UE/LE Coordination activities, Visual/perceptual remediation/compensation, Wheelchair propulsion/positioning  OT Interventions Community reintegration, Training and development officer, Disease mangement/prevention, Functional electrical stimulation, Neuromuscular re-education, Self Care/advanced ADL retraining, Skin care/wound managment, Psychosocial support, Pain management, Functional mobility training, DME/adaptive equipment instruction, Discharge planning, Cognitive remediation/compensation, Patient/family education, Splinting/orthotics, Therapeutic Exercise, UE/LE Coordination activities, Wheelchair propulsion/positioning, Visual/perceptual remediation/compensation, UE/LE Strength taining/ROM, Therapeutic Activities  SLP Interventions Cognitive remediation/compensation, Dysphagia/aspiration precaution training, Internal/external aids, Functional tasks, Patient/family education, Therapeutic Exercise, Therapeutic Activities, Environmental controls  TR Interventions    SW/CM Interventions Discharge Planning, Psychosocial Support, Patient/Family Education    Team Discharge Planning: Destination: PT-Home ,OT- Home , SLP-Skilled Nursing Facility (SNF) Projected Follow-up: PT-Home health PT, 24 hour supervision/assistance, OT-  Home health OT, SLP-Skilled Nursing facility, 24 hour supervision/assistance Projected  Equipment Needs: PT-To be determined, OT- To be determined, SLP-To be determined Equipment Details: PT- , OT-  Patient/family involved in discharge planning: PT- Patient, Family member/caregiver,  OT-Patient, SLP-Patient, Family member/caregiver  MD ELOS: 18-21 days. 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 Patient is wheelchair bound--requires supervision for sit to stand transfers and able to ambulate occasionally. He was admitted on 10/27/16 with new onset right sided weakness, fall and inability to stand. NIHSS score 6 and CT head without acute abnormality. MRI brain done revealing 2 acute infarcts in the right lateral lenticulostriate distribution. CT angiogram head and neck negative for emergent large vessel occlusion with pseudoaneurysm of the proximal brachiocephalic artery, moderate to severe tandem stenosis of the right MCA mid M1 segment and at the right MCA bifurcation as well as high-grade radiographic string sign stenosis of the left ICA.Dr. Donnetta Hutching consulted for input and did not recommend L-CEA at this time. 2D echo with EF 65-70%, moderate LVH, grade 2 diastolic dysfunction with mild to moderate AVR and mild MVR. Dr. Erlinda Hong recommends 30 day event monitor to work up cardioembolic stroke--to continue DAPT for 3 months followed by plavix alone and Long term BP goal 130-150 due to L-ICA stenosis (string sign). Patient with dysphagia and change to dysphagia 1 honey thick liquids. Patient with resultant dense left hemiparesis, dysphagia with difficulty handling oral secretions, cognitive deficits requiring verbal and tactile cues to follow one step commands and left neglect. Will set goals for mod assist with physical therapy and occupational therapy and Min A/supervision with SLP.   See Team Conference Notes for weekly updates  to the plan of care

## 2016-11-01 NOTE — Plan of Care (Signed)
Problem: RH SAFETY Goal: RH STG DECREASED RISK OF FALL WITH ASSISTANCE STG Decreased Risk of Fall With minimal Assistance.   Outcome: Not Progressing Max assist

## 2016-11-01 NOTE — Progress Notes (Signed)
Occupational Therapy Session Note  Patient Details  Name: [name] Cook MRN: [id number] Date of Birth: [birth year]  Today's Date: 11/01/2016 OT Individual Time: 1300-1415 OT Individual Time Calculation (min): 75 min   Short Term Goals: Week 1:  OT Short Term Goal 1 (Week 1): Pt will maintan sitting balance with min A during simple ADL task OT Short Term Goal 2 (Week 1): Pt will transfer with mod A +2 with LRAD bed/ chair in prep for ADL task OT Short Term Goal 3 (Week 1): Pt will demonstrate safe management of left UE with bed mobility with mod cuing  OT Short Term Goal 4 (Week 1): Pt will roll for LB clothing management with mod A with bed rails   Skilled Therapeutic Interventions/Progress Updates:   Pt greeted in TIS w/c finishing lunch upon OT arrival. Cues for swallow and cues to check for pocketing in L cheek. Pt completed oral care at the sink with OT providing instruction on one-handed techniques, and sutcion used for safety. Incorporated mirror feedback while sitting at the sink to assist with midline posturing. ble to reach across midline and turn on water w/ max cues to attend to items on L side of sink. Pt brought to therapy gym and tolerated 45 seconds in standing frame with Max A on L trunk/shoulder to come to midline. Total A+2 sliding board > L back to bed. NMR in side-lying in bed to L Ue. Trace scap protraction/retraction w/ OT providing joint input to bring L UE through full ROM. Pt left semi-reclined in bed at end of session with needs met.   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   Therapy/Group: Individual Therapy  Valma Cava 11/01/2016, 3:51 PM

## 2016-11-01 NOTE — Progress Notes (Signed)
Speech Language Pathology Daily Session Note  Patient Details  Name: [name] Cook MRN: [id number] Date of Birth: 12-29-1940  Today's Date: 11/01/2016 SLP Individual Time: 0830-0930 SLP Individual Time Calculation (min): 60 min   Short Term Goals: Week 1: SLP Short Term Goal 1 (Week 1): Pt will conssitently demonstrate orientation x 4 with Min A verbal cues.  SLP Short Term Goal 2 (Week 1): Pt will attend to basic, familiar tasks for ~5 minutes intervals with Mod A verbal cues for redirection.  SLP Short Term Goal 3 (Week 1): Pt will attend to left field of environment during functional tasks with Mod A verbal cues.  SLP Short Term Goal 4 (Week 1): Pt will consume current diet without overt s/s of aspiration with Mod A verbal cues for use of swallowing compensatory strategies.  SLP Short Term Goal 5 (Week 1): Pt will consume trials of nectar thick liquids without overt s/s of aspiration with Mod A verbal cues .  SLP Short Term Goal 6 (Week 1): Pt will utilize speech intelligibility strategies at the pharse level with Max A verbal cues to achieve 75% intelligibility.   Skilled Therapeutic Interventions: Skilled treatment session focused on dysphagia and cognition goals. SLP facilitated session by providing skilled observation of dysphagia 2 and puree food items with honey thick liqiuds. Pt with decreased bolus management and inattention to bolus of dysphagia 2 items c/b 3 to 4 minute oral phase with significantly decreased bolus cohesion. With Max multimodal cues,  pt's oral prep time decreased to 30 to 40 seconds with trials of puree and honey thick liquids. Pt's oral phase continues to be c/b inattention to bolus and lingual pumping felt by palpation with suspected delayed swallow initiation.  SLP recommend changing diet to dysphagia 1 and continuing honey thick. Pt and family agreeable. Pt attended to basic familiar task for ~3 minutes with Mod A verbal cues. Pt left in bed with wife and son  present. Education provided on pt's dysphagia and cognition deficits. All questions answered to satisfaction at this time. Continue current plan of care.   Function:  Eating Eating   Modified Consistency Diet: Yes Eating Assist Level: Set up assist for;Helper checks for pocketed food;Helper scoops food on utensil;Helper brings food to mouth;Hand over hand assist   Eating Set Up Assist For: Opening containers;Cutting food Helper [name] on Utensil: Occasionally Helper [name] to Mouth: Occasionally   Cognition Comprehension Comprehension assist level: Understands basic 50 - 74% of the time/ requires cueing 25 - 49% of the time  Expression   Expression assist level: Expresses basic 75 - 89% of the time/requires cueing 10 - 24% of the time. Needs helper to occlude trach/needs to repeat words.  Social Interaction Social Interaction assist level: Interacts appropriately 90% of the time - Needs monitoring or encouragement for participation or interaction.  Problem Solving Problem solving assist level: Solves basic 25 - 49% of the time - needs direction more than half the time to initiate, plan or complete simple activities  Memory Memory assist level: Recognizes or recalls 25 - 49% of the time/requires cueing 50 - 75% of the time    Pain    Therapy/Group: Individual Therapy  [name] Cook 11/01/2016, 11:09 AM

## 2016-11-02 ENCOUNTER — Inpatient Hospital Stay (HOSPITAL_COMMUNITY): Payer: Medicare Other | Admitting: Occupational Therapy

## 2016-11-02 ENCOUNTER — Inpatient Hospital Stay (HOSPITAL_COMMUNITY): Payer: Medicare Other | Admitting: Physical Therapy

## 2016-11-02 ENCOUNTER — Inpatient Hospital Stay (HOSPITAL_COMMUNITY): Payer: Medicare Other | Admitting: Speech Pathology

## 2016-11-02 LAB — GLUCOSE, CAPILLARY
GLUCOSE-CAPILLARY: 162 mg/dL — AB (ref 65–99)
GLUCOSE-CAPILLARY: 144 mg/dL — AB (ref 65–99)
GLUCOSE-CAPILLARY: 136 mg/dL — AB (ref 65–99)
Glucose-Capillary: 136 mg/dL — ABNORMAL HIGH (ref 65–99)

## 2016-11-02 MED ORDER — BACLOFEN 10 MG PO TABS
10.0000 mg | ORAL_TABLET | Freq: Four times a day (QID) | ORAL | Status: DC
  Administered 2016-11-02 – 2016-11-05 (×15): 10 mg via ORAL
  Filled 2016-11-02 (×15): qty 1

## 2016-11-02 NOTE — Progress Notes (Signed)
[place] PHYSICAL MEDICINE & REHABILITATION     PROGRESS NOTE    Subjective/Complaints: No issues overnite  ROS: +Hiccups. Denies CP, SOB, nausea, vomiting, diarrhea.  Objective: Vital Signs: Blood pressure (!) 139/99, pulse 84, temperature 98.6 F (37 C), temperature source Oral, resp. rate 18, height 5\' 8"  (1.727 m), weight 90.4 kg (199 lb 6.4 oz), SpO2 93 %. No results found.  Recent Labs  10/31/16 0526  WBC 10.3  HGB 13.6  HCT 40.1  PLT 230    Recent Labs  10/31/16 0526  NA 141  K 3.5  CL 105  GLUCOSE 152*  BUN 16  CREATININE 0.99  CALCIUM 9.0   CBG (last 3)   Recent Labs  11/01/16 1648 11/01/16 2108 11/02/16 0648  GLUCAP 118* 182* 162*    Wt Readings from Last 3 Encounters:  10/30/16 90.4 kg (199 lb 6.4 oz)  10/28/16 92 kg (202 lb 12.8 oz)  09/20/16 95.8 kg (211 lb 4 oz)    Physical Exam:  Constitutional: NAD. Vital signs reviewed.   HENT: Normocephalic and atraumatic.  Eyes: EOM are normal. No discharge.  Cardiovascular: RRR, +murmur. No JVD. Respiratory: clear GI: Soft. Bowel sounds are normal. Musculoskeletal: He exhibits edema (tr edema left hand. ). He exhibits no tenderness.  RLE BKA  Neurological: He is alert.  A&Ox3 with cues Right gaze preference with left inattention (improving).  Mild dysarthria Left facial weakness Motor: LUE/LLE: 0/5 with emerging extensor tone LLE. RUE: 5/5 proximal to distal RLE: HF 5/5   Skin: Skin is warm and dry and intact.  Psychiatric: flat.    Assessment/Plan: 1. Functional deficits secondary to right basal ganglia infarct which require 3+ hours per day of interdisciplinary therapy in a comprehensive inpatient rehab setting. Physiatrist is providing close team supervision and 24 hour management of active medical problems listed below. Physiatrist and rehab team continue to assess barriers to discharge/monitor patient progress toward functional and medical goals.  Function:  Bathing Bathing  position   Position: Bed  Bathing parts Body parts bathed by patient: Left arm, Chest, Abdomen Body parts bathed by helper: Right arm, Front perineal area, Buttocks, Right upper leg, Left upper leg, Right lower leg, Left lower leg, Back  Bathing assist Assist Level: Touching or steadying assistance(Pt > 75%)      Upper Body Dressing/Undressing Upper body dressing   What is the patient wearing?: Pull over shirt/dress     Pull over shirt/dress - Perfomed by patient: Thread/unthread right sleeve, Put head through opening Pull over shirt/dress - Perfomed by helper: Thread/unthread left sleeve, Pull shirt over trunk        Upper body assist Assist Level: 2 helpers      Lower Body Dressing/Undressing Lower body dressing   What is the patient wearing?: Pants, Socks, Shoes, AFO (prosthesis)       Pants- Performed by helper: Thread/unthread right pants leg, Thread/unthread left pants leg, Pull pants up/down       Socks - Performed by helper: Don/doff left sock   Shoes - Performed by helper: Don/doff left shoe, Fasten left   AFO - Performed by helper: Don/doff right AFO      Lower body assist Assist for lower body dressing: 2 Helpers      Naval architect activity did not occur: No continent bowel/bladder event        Toileting assist     Transfers Chair/bed transfer   Chair/bed transfer method: Lateral scoot Chair/bed transfer assist level: 2 helpers Chair/bed  transfer assistive device: Sliding board, Prosthesis, Armrests Mechanical lift: Maximove   Locomotion Ambulation Ambulation activity did not occur: Safety/medical concerns         Wheelchair       Assist Level: Dependent (Pt equals 0%)  Cognition Comprehension Comprehension assist level: Understands basic 50 - 74% of the time/ requires cueing 25 - 49% of the time  Expression Expression assist level: Expresses basic 75 - 89% of the time/requires cueing 10 - 24% of the time. Needs helper to  occlude trach/needs to repeat words.  Social Interaction Social Interaction assist level: Interacts appropriately 90% of the time - Needs monitoring or encouragement for participation or interaction.  Problem Solving Problem solving assist level: Solves basic 25 - 49% of the time - needs direction more than half the time to initiate, plan or complete simple activities  Memory Memory assist level: Recognizes or recalls 25 - 49% of the time/requires cueing 50 - 75% of the time   Medical Problem List and Plan: 1.  Left hemiplegia, dysarthria, dysphagia secondary to right basal ganglia infarct with history of right BKA  Continue CIR.PT, OT SLP  Will order bracing  Fluoxetine started 12/8 2.  DVT Prophylaxis/Anticoagulation: Pharmaceutical: Lovenox 3. Pain Management: tylenol prn. Continue to educate patient family on LUE positioning.  4. Mood: LCSW to follow for evaluation and support.  5. Neuropsych: This patient is not fully capable of making decisions on his own behalf. 6. Skin/Wound Care: routine pressure relief measures 7. Fluids/Electrolytes/Nutrition: Monitor I/O.   Monitor hydration on honey liquids- concerns of adequate intake with ongoing hiccups and coughing due to difficulty with secretions.  8. HTN: Monitor BP. Goal of BP 130-150--avoid hypotension   Vitals:   10/31/16 1446 11/01/16 0456  BP: (!) 150/66 (!) 139/99  Pulse: 93 84  Resp: 18 18  Temp: 98.6 F (37 C) 98.6 F (37 C)   9. 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  11. Hiccups: Baclofen increased to 10mg  QID monitor for sedation 12. GERD: On Zantac bid. 13. Pre-diabtes: Hgb A1c- 5.9.   CBG (last 3)   Recent Labs  11/01/16 1648 11/01/16 2108 11/02/16 0648  GLUCAP 118* 182* 162*     Continue to monitor   LOS (Days) 3 A FACE TO FACE EVALUATION WAS PERFORMED  [name] Cook 11/02/2016 8:48 AM

## 2016-11-02 NOTE — Progress Notes (Addendum)
Speech Language Pathology Daily Session Note  Patient Details  Name: [name] Cook MRN: [id number] Date of Birth: Sep 02, 1941  Today's Date: 11/02/2016 SLP Individual Time: [id number] SLP Individual Time Calculation (min): 45 min   Short Term Goals: Week 1: SLP Short Term Goal 1 (Week 1): Pt will conssitently demonstrate orientation x 4 with Min A verbal cues.  SLP Short Term Goal 2 (Week 1): Pt will attend to basic, familiar tasks for ~5 minutes intervals with Mod A verbal cues for redirection.  SLP Short Term Goal 3 (Week 1): Pt will attend to left field of environment during functional tasks with Mod A verbal cues.  SLP Short Term Goal 4 (Week 1): Pt will consume current diet without overt s/s of aspiration with Mod A verbal cues for use of swallowing compensatory strategies.  SLP Short Term Goal 5 (Week 1): Pt will consume trials of nectar thick liquids without overt s/s of aspiration with Mod A verbal cues .  SLP Short Term Goal 6 (Week 1): Pt will utilize speech intelligibility strategies at the pharse level with Max A verbal cues to achieve 75% intelligibility.   Skilled Therapeutic Interventions: Skilled treatment session focused on dysphagia and cognition goals. SLP facilitated session by providing skilled observation of consumption of honey thick liquids. SLP provided Mod A verbal cues for increased attention to bolus. Pt with progress - oral holding reduced to 10 to 15 seconds per sip. Pt given 3 trials of ice chips with continued inattention to bolus and 10-15 seconds before swallow felt by palpation.  Hiccups still present. Pt required Mod A faded to Min A verbal cues for attention to left field of his environment. Pt left upright in bed with bed alarm on and all needs within reach. Continue current plan of care.   Function:  Eating Eating   Modified Consistency Diet: Yes Eating Assist Level: Set up assist for;Helper checks for pocketed food;Helper scoops food on utensil;Helper  brings food to mouth;Hand over hand assist   Eating Set Up Assist For: Opening containers;Cutting food Helper [place] on Utensil: Occasionally Helper [place] to Mouth: Occasionally   Cognition Comprehension Comprehension assist level: Understands basic 50 - 74% of the time/ requires cueing 25 - 49% of the time  Expression   Expression assist level: Expresses basic 75 - 89% of the time/requires cueing 10 - 24% of the time. Needs helper to occlude trach/needs to repeat words.  Social Interaction Social Interaction assist level: Interacts appropriately 90% of the time - Needs monitoring or encouragement for participation or interaction.  Problem Solving Problem solving assist level: Solves basic 25 - 49% of the time - needs direction more than half the time to initiate, plan or complete simple activities  Memory Memory assist level: Recognizes or recalls 25 - 49% of the time/requires cueing 50 - 75% of the time    Pain    Therapy/Group: Individual Therapy  Towanna Avery 11/02/2016, 9:22 AM

## 2016-11-02 NOTE — Progress Notes (Signed)
Physical Therapy Session Note  Patient Details  Name: Zacharay Dragone MRN: [id number] Date of Birth: [birth year]  Today's Date: 11/02/2016 PT Individual Time: 1301-1400   PT Individual Time Calculation: 59 min     Short Term Goals: Week 1:  PT Short Term Goal 1 (Week 1): Patient will perform bed mobility with max A.  PT Short Term Goal 2 (Week 1): Patient will perform transfers with assist of 1 person.  PT Short Term Goal 3 (Week 1): Patient will maintain static sitting balance with min A x 5 min.  PT Short Term Goal 4 (Week 1): Patient will initiate wheelchair propulsion.   Skilled Therapeutic Interventions/Progress Updates:     Standing frame 2 bouts up to 7 minutes followed by prolonged rest break with mod-max cues for posture and orientation to midline. PT noted pushing tendencies in the R LE with elbow extended. Patient able to maintain near neutral standing posture up to 45 seconds with tactile and visual cues before leaning to L and requireing increased cues.    Lateral reaching outside BOS to the R to engage L trunk followed by cross body reach to place beetle in cup on the L side and force attention to the L.   NMR to attempt to elicit trace activation in the L LE x 5-7 trials in each muscle group. No functional activation noted in the HS of Quad, but trace Grastroc noted in 1 of 5 trials. PT utilize quick strength and MM tapping to attempt response.  Stretching in the LLE to prevent joint contracture and limit development of tone.   Patient returned to room and left sitting in TIS WC.     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   Therapy/Group: Individual Therapy  Lorie Phenix 11/02/2016, 1:53 PM

## 2016-11-02 NOTE — Progress Notes (Signed)
Occupational Therapy Session Note  Patient Details  Name: [name] Cook MRN: [id number] Date of Birth: [birth year]  Today's Date: 11/02/2016 OT Individual Time:  -   11:15- 1230   (75 min)  1st session                                           1445-1600 (75 min) 2nd session       Short Term Goals: Week 1:  OT Short Term Goal 1 (Week 1): Pt will maintan sitting balance with min A during simple ADL task OT Short Term Goal 2 (Week 1): Pt will transfer with mod A +2 with LRAD bed/ chair in prep for ADL task OT Short Term Goal 3 (Week 1): Pt will demonstrate safe management of left UE with bed mobility with mod cuing  OT Short Term Goal 4 (Week 1): Pt will roll for LB clothing management with mod A with bed rails      Skilled Therapeutic Interventions/Progress Updates:    1st session:  Pt received upright in w/c, performed bed mobility, sitting balance, attention to left, transfers.  Pt was +2 for bed mobility, and sliding board transfer.  Wife, [name] present during session.  Performed sitting balance EOB for 10 mins with max assist.  Did transfer to wc with sliding board.  Completed UB bathing and dressing and sink.  2 nd session:  Performed NMRE to LUE in all quadrants.  Gave written instructions for neck and looking to the left for wife to practice when pt not in sessions.  Went over neck retraction, lateral and rotational neck movements and holding for 20 counts.  Ppt had hick ups during entire session.  He is emotionally labile 6-7 times during session.   Left ppt in wc with wife in room.   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   Therapy/Group: Individual Therapy  Lisa Roca 11/02/2016, 11:11 AM

## 2016-11-03 LAB — GLUCOSE, CAPILLARY
GLUCOSE-CAPILLARY: 130 mg/dL — AB (ref 65–99)
GLUCOSE-CAPILLARY: 133 mg/dL — AB (ref 65–99)
GLUCOSE-CAPILLARY: 132 mg/dL — AB (ref 65–99)
GLUCOSE-CAPILLARY: 134 mg/dL — AB (ref 65–99)

## 2016-11-03 NOTE — Progress Notes (Signed)
Intermittent hiccups during the night. Reports increased baclofen dose has helped. Poor PO intake, fluids encouraged. Refused PRN trazodone. [name] Cook

## 2016-11-03 NOTE — Progress Notes (Signed)
[facility] PHYSICAL MEDICINE & REHABILITATION     PROGRESS NOTE    Subjective/Complaints: Hiccups were better per RN and wife overnite  ROS: +Hiccups. Denies CP, SOB, nausea, vomiting, diarrhea.  Objective: Vital Signs: Blood pressure (!) 186/88, pulse 88, temperature 97.8 F (36.6 C), temperature source Oral, resp. rate 20, height 5\' 8"  (1.727 m), weight 90.4 kg (199 lb 6.4 oz), SpO2 97 %. No results found. No results for input(s): WBC, HGB, HCT, PLT in the last 72 hours. No results for input(s): NA, K, CL, GLUCOSE, BUN, CREATININE, CALCIUM in the last 72 hours.  Invalid input(s): CO CBG (last 3)   Recent Labs  11/02/16 1652 11/02/16 2109 11/03/16 0650  GLUCAP 136* 136* 130*    Wt Readings from Last 3 Encounters:  10/30/16 90.4 kg (199 lb 6.4 oz)  10/28/16 92 kg (202 lb 12.8 oz)  09/20/16 95.8 kg (211 lb 4 oz)    Physical Exam:  Constitutional: NAD. Vital signs reviewed.   HENT: Normocephalic and atraumatic.  Eyes: EOM are normal. No discharge.  Cardiovascular: RRR, +murmur. No JVD. Respiratory: clear GI: Soft. Bowel sounds are normal. Musculoskeletal: He exhibits edema (tr edema left hand. ). He exhibits no tenderness.  RLE BKA  Neurological: He is alert.  A&Ox3 with cues Right gaze preference with left inattention (improving).  Mild dysarthria Left facial weakness Motor: LUE/LLE: 0/5 with emerging extensor tone LLE. RUE: 5/5 proximal to distal RLE: HF 5/5   Skin: Skin is warm and dry and intact.  Psychiatric: flat.    Assessment/Plan: 1. Functional deficits secondary to right basal ganglia infarct which require 3+ hours per day of interdisciplinary therapy in a comprehensive inpatient rehab setting. Physiatrist is providing close team supervision and 24 hour management of active medical problems listed below. Physiatrist and rehab team continue to assess barriers to discharge/monitor patient progress toward functional and medical  goals.  Function:  Bathing Bathing position   Position: Bed  Bathing parts Body parts bathed by patient: Left arm, Chest, Abdomen, Front perineal area Body parts bathed by helper: Right arm, Buttocks, Right upper leg, Left upper leg, Right lower leg, Left lower leg, Back  Bathing assist Assist Level: 2 helpers      Upper Body Dressing/Undressing Upper body dressing   What is the patient wearing?: Pull over shirt/dress     Pull over shirt/dress - Perfomed by patient: Thread/unthread right sleeve, Put head through opening Pull over shirt/dress - Perfomed by helper: Thread/unthread left sleeve, Pull shirt over trunk        Upper body assist Assist Level: 2 helpers, Touching or steadying assistance(Pt > 75%)      Lower Body Dressing/Undressing Lower body dressing   What is the patient wearing?: Pants, Socks, Shoes, AFO       Pants- Performed by helper: Thread/unthread right pants leg, Thread/unthread left pants leg, Pull pants up/down       Socks - Performed by helper: Don/doff left sock   Shoes - Performed by helper: Don/doff left shoe, Fasten left   AFO - Performed by helper: Don/doff right AFO      Lower body assist Assist for lower body dressing: 2 Helpers      Naval architect activity did not occur: No continent bowel/bladder event        Toileting assist     Transfers Chair/bed transfer   Chair/bed transfer method: Lateral scoot Chair/bed transfer assist level: 2 helpers Chair/bed transfer assistive device: Sliding board, Prosthesis, Armrests Mechanical lift: Maximove  Locomotion Ambulation Ambulation activity did not occur: Safety/medical concerns         Wheelchair       Assist Level: Dependent (Pt equals 0%)  Cognition Comprehension Comprehension assist level: Understands basic 50 - 74% of the time/ requires cueing 25 - 49% of the time  Expression Expression assist level: Expresses basic 50 - 74% of the time/requires cueing 25  - 49% of the time. Needs to repeat parts of sentences.  Social Interaction Social Interaction assist level: Interacts appropriately 90% of the time - Needs monitoring or encouragement for participation or interaction.  Problem Solving Problem solving assist level: Solves basic 25 - 49% of the time - needs direction more than half the time to initiate, plan or complete simple activities  Memory Memory assist level: Recognizes or recalls 25 - 49% of the time/requires cueing 50 - 75% of the time   Medical Problem List and Plan: 1.  Left hemiplegia, dysarthria, dysphagia secondary to right basal ganglia infarct with history of right BKA  Continue CIR.PT, OT SLP  Has prevalon boot and left resting hand splint  Fluoxetine started 12/8 2.  DVT Prophylaxis/Anticoagulation: Pharmaceutical: Lovenox 3. Pain Management: tylenol prn. Continue to educate patient family on LUE positioning.  4. Mood: LCSW to follow for evaluation and support.  5. Neuropsych: This patient is not fully capable of making decisions on his own behalf. 6. Skin/Wound Care: routine pressure relief measures 7. Fluids/Electrolytes/Nutrition: Monitor I/O.   Monitor hydration on honey liquids- concerns of adequate intake with ongoing hiccups and coughing due to difficulty with secretions.  8. HTN: Monitor BP. 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  11. Hiccups: Improved  Baclofen increased to 10mg  QID monitor for sedation 12. GERD: On Zantac bid. 13. Pre-diabtes: Hgb A1c- 5.9.   CBG (last 3)   Recent Labs  11/02/16 1652 11/02/16 2109 11/03/16 0650  GLUCAP 136* 136* 130*     Continue to monitor   LOS (Days) 4 A FACE TO FACE EVALUATION WAS PERFORMED  Sumedh Shinsato E 11/03/2016 9:01 AM

## 2016-11-04 ENCOUNTER — Inpatient Hospital Stay (HOSPITAL_COMMUNITY): Payer: Medicare Other | Admitting: Physical Therapy

## 2016-11-04 ENCOUNTER — Inpatient Hospital Stay (HOSPITAL_COMMUNITY): Payer: Medicare Other | Admitting: Speech Pathology

## 2016-11-04 ENCOUNTER — Inpatient Hospital Stay (HOSPITAL_COMMUNITY): Payer: Medicare Other | Admitting: Occupational Therapy

## 2016-11-04 ENCOUNTER — Inpatient Hospital Stay (HOSPITAL_COMMUNITY): Payer: Medicare Other

## 2016-11-04 LAB — GLUCOSE, CAPILLARY
GLUCOSE-CAPILLARY: 142 mg/dL — AB (ref 65–99)
GLUCOSE-CAPILLARY: 170 mg/dL — AB (ref 65–99)
Glucose-Capillary: 136 mg/dL — ABNORMAL HIGH (ref 65–99)
Glucose-Capillary: 129 mg/dL — ABNORMAL HIGH (ref 65–99)

## 2016-11-04 MED ORDER — LISINOPRIL 2.5 MG PO TABS
2.5000 mg | ORAL_TABLET | Freq: Every day | ORAL | Status: DC
  Administered 2016-11-04 – 2016-11-05 (×2): 2.5 mg via ORAL
  Filled 2016-11-04 (×2): qty 1

## 2016-11-04 MED ORDER — LISINOPRIL 5 MG PO TABS: 5.0000 mg | ORAL_TABLET | Freq: Every day | ORAL | Status: DC

## 2016-11-04 NOTE — Progress Notes (Addendum)
Physical Therapy Session Note  Patient Details  Name: [name] Cook MRN: [id number] Date of Birth: 05/06/1941  Today's Date: 11/04/2016 PT Individual Time: 0905-1005 PT Individual Time Calculation (min): 60 min    Short Term Goals: Week 2:     Skilled Therapeutic Interventions/Progress Updates:    Treatment initiated today with pt lying in bed.  Treatment today focused on decreasing burden of care with dressing, bed mobility, transfers, and L sided awareness as well as improving weight shifting for bed mobility and transfers.  Pt required total assist x 2 for lower body dressing as well as for sliding board transfer to chair, all bed mobility and bilateral rolling.  Pt continues to show inattention to left side so to improve this, performed activities to facilitate L sided attention while propelling chair down the hall way to the PT gym.  Pt performed seated reaching activities with R UE towards L side of body to improve trunk stability in sitting and improve core strength for transfers as well as to improve L sided attention.  Pt also performed seated weight shifting bilaterally to promote decreased burden of care with transfers.  Following session, pt was left seated up in chair with lap belt donned, call bell in reach and needs met.  Pt was requesting drink of ice water, nursing was notified of pt request due to swallowing precautions and verbalized intent to meet this need. 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   See Function Navigator for Current Functional Status.   Therapy/Group: Individual Therapy  [name] Hilario Quarry 11/04/2016, 12:34 PM

## 2016-11-04 NOTE — Progress Notes (Signed)
[facility] PHYSICAL MEDICINE & REHABILITATION     PROGRESS NOTE    Subjective/Complaints: Pt seen laying in bed this AM. He slept well overnight.  His hiccups appear to have resolved for the time being.   ROS: Denies CP, SOB, nausea, vomiting, diarrhea.  Objective: Vital Signs: Blood pressure (!) 166/63, pulse 80, temperature 100.1 F (37.8 C), temperature source Axillary, resp. rate 18, height 5\' 8"  (1.727 m), weight 90.4 kg (199 lb 6.4 oz), SpO2 98 %. No results found. No results for input(s): WBC, HGB, HCT, PLT in the last 72 hours. No results for input(s): NA, K, CL, GLUCOSE, BUN, CREATININE, CALCIUM in the last 72 hours.  Invalid input(s): CO CBG (last 3)   Recent Labs  11/03/16 1626 11/03/16 2117 11/04/16 0631  GLUCAP 132* 134* 136*    Wt Readings from Last 3 Encounters:  10/30/16 90.4 kg (199 lb 6.4 oz)  10/28/16 92 kg (202 lb 12.8 oz)  09/20/16 95.8 kg (211 lb 4 oz)    Physical Exam:  Constitutional: NAD. Vital signs reviewed.   HENT: Normocephalic and atraumatic.  Eyes: EOM are normal. No discharge.  Cardiovascular: RRR, +murmur. No JVD. Respiratory: clear. Unlabored GI: Soft. Bowel sounds are normal. Musculoskeletal: He exhibits no edema. He exhibits no tenderness.  RLE BKA healed Neurological: He is alert.  A&Ox3 with cues Right gaze preference with left inattention (improving).  Mild dysarthria Left facial weakness Motor: LUE/LLE: 0/5. No increase in tone noted. RUE: 5/5 proximal to distal RLE: HF 5/5   Skin: Skin is warm and dry and intact.  Psychiatric: Flat.   Assessment/Plan: 1. Functional deficits secondary to right basal ganglia infarct which require 3+ hours per day of interdisciplinary therapy in a comprehensive inpatient rehab setting. Physiatrist is providing close team supervision and 24 hour management of active medical problems listed below. Physiatrist and rehab team continue to assess barriers to discharge/monitor patient progress  toward functional and medical goals.  Function:  Bathing Bathing position   Position: Bed  Bathing parts Body parts bathed by patient: Left arm, Chest, Abdomen, Front perineal area Body parts bathed by helper: Right arm, Buttocks, Right upper leg, Left upper leg, Right lower leg, Left lower leg, Back  Bathing assist Assist Level: 2 helpers      Upper Body Dressing/Undressing Upper body dressing   What is the patient wearing?: Pull over shirt/dress     Pull over shirt/dress - Perfomed by patient: Thread/unthread right sleeve, Put head through opening Pull over shirt/dress - Perfomed by helper: Thread/unthread left sleeve, Pull shirt over trunk        Upper body assist Assist Level: 2 helpers, Touching or steadying assistance(Pt > 75%)      Lower Body Dressing/Undressing Lower body dressing   What is the patient wearing?: Pants, Socks, Shoes, AFO       Pants- Performed by helper: Thread/unthread right pants leg, Thread/unthread left pants leg, Pull pants up/down       Socks - Performed by helper: Don/doff left sock   Shoes - Performed by helper: Don/doff left shoe, Fasten left   AFO - Performed by helper: Don/doff right AFO      Lower body assist Assist for lower body dressing: 2 Helpers      Naval architect activity did not occur: No continent bowel/bladder event        Toileting assist     Transfers Chair/bed transfer   Chair/bed transfer method: Lateral scoot Chair/bed transfer assist level: 2 helpers Chair/bed  transfer assistive device: Sliding board, Prosthesis, Armrests Mechanical lift: Maximove   Locomotion Ambulation Ambulation activity did not occur: Safety/medical concerns         Wheelchair       Assist Level: Dependent (Pt equals 0%)  Cognition Comprehension Comprehension assist level: Understands basic 50 - 74% of the time/ requires cueing 25 - 49% of the time  Expression Expression assist level: Expresses basic 50 - 74%  of the time/requires cueing 25 - 49% of the time. Needs to repeat parts of sentences.  Social Interaction Social Interaction assist level: Interacts appropriately 90% of the time - Needs monitoring or encouragement for participation or interaction.  Problem Solving Problem solving assist level: Solves basic 25 - 49% of the time - needs direction more than half the time to initiate, plan or complete simple activities  Memory Memory assist level: Recognizes or recalls 25 - 49% of the time/requires cueing 50 - 75% of the time   Medical Problem List and Plan: 1.  Left hemiplegia, dysarthria, dysphagia secondary to right basal ganglia infarct with history of right BKA  Continue CIR  Has left resting hand splint, PRAFO not present, will need to follow up  Fluoxetine started 12/8 2.  DVT Prophylaxis/Anticoagulation: Pharmaceutical: Lovenox 3. Pain Management: tylenol prn. Continue to educate patient family on LUE positioning.  4. Mood: LCSW to follow for evaluation and support.  5. Neuropsych: This patient is not fully capable of making decisions on his own behalf. 6. Skin/Wound Care: routine pressure relief measures 7. Fluids/Electrolytes/Nutrition: Monitor I/O.   Monitor hydration on honey liquids- concerns of adequate intake with ongoing hiccups and coughing due to difficulty with secretions.  8. HTN: Monitor BP. Goal of BP 130-150--avoid hypotension  Lisinopril 2.5 started 12/11 Vitals:   11/03/16 1501 11/04/16 0339  BP: (!) 161/80 (!) 166/63  Pulse: 95 80  Resp: 18 18  Temp: 98.1 F (36.7 C) 100.1 F (37.8 C)   9. 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  11. Hiccups:   Baclofen increased to 10mg  QID monitor for sedation, ?resolved. 12. GERD: On Zantac bid. 13. Pre-diabtes: Hgb A1c- 5.9.   CBG (last 3)   Recent Labs  11/03/16 1626 11/03/16 2117 11/04/16 0631  GLUCAP 132* 134* 136*   Continue to monitor   LOS (Days) 5 A FACE TO  FACE EVALUATION WAS PERFORMED  Ankit Lorie Phenix 11/04/2016 10:59 AM

## 2016-11-04 NOTE — Progress Notes (Addendum)
Occupational Therapy Session Note  Patient Details  Name: [name] Cook MRN: [id number] Date of Birth: [birth year]  Today's Date: 11/04/2016 OT Individual Time: 1100-1201 OT Individual Time Calculation (min): 61 min   Short Term Goals: Week 1:  OT Short Term Goal 1 (Week 1): Pt will maintan sitting balance with min A during simple ADL task OT Short Term Goal 2 (Week 1): Pt will transfer with mod A +2 with LRAD bed/ chair in prep for ADL task OT Short Term Goal 3 (Week 1): Pt will demonstrate safe management of left UE with bed mobility with mod cuing  OT Short Term Goal 4 (Week 1): Pt will roll for LB clothing management with mod A with bed rails   Skilled Therapeutic Interventions/Progress Updates:    OT treatment session focused on transfer training,  L side attention, L NMR, one-handed grooming tasks, postural control and sitting balance. Pt brought to sink for oral care focused on sequencing, attention to L, and one-handed techniques. Worked on verbalizing sequence of task. Pt able to remove lid of toothpaste w/ R Ue, and completed activity with overall Mod A. Max A+2 sliding board transfer > R from w/c to therapy mat using bobath method. . 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 Max multimodal cues to attend to activity in moderately distracting environment w/ Max A to maintain siting balance with intermittent Min A for short bouts. Pt returned to room and required total A x2 sliding board transfer back to bed > R.  Pt left with needs met and spouse present.  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  Therapy/Group: Individual Therapy  Valma Cava 11/04/2016, 12:27 PM

## 2016-11-04 NOTE — Progress Notes (Signed)
Speech Language Pathology Daily Session Note  Patient Details  Name: [name] Cook MRN: [id number] Date of Birth: 11-07-1941  Today's Date: 11/04/2016 SLP Individual Time: 1434-1500 SLP Individual Time Calculation (min): 26 min   Short Term Goals: Week 1: SLP Short Term Goal 1 (Week 1): Pt will conssitently demonstrate orientation x 4 with Min A verbal cues.  SLP Short Term Goal 2 (Week 1): Pt will attend to basic, familiar tasks for ~5 minutes intervals with Mod A verbal cues for redirection.  SLP Short Term Goal 3 (Week 1): Pt will attend to left field of environment during functional tasks with Mod A verbal cues.  SLP Short Term Goal 4 (Week 1): Pt will consume current diet without overt s/s of aspiration with Mod A verbal cues for use of swallowing compensatory strategies.  SLP Short Term Goal 5 (Week 1): Pt will consume trials of nectar thick liquids without overt s/s of aspiration with Mod A verbal cues .  SLP Short Term Goal 6 (Week 1): Pt will utilize speech intelligibility strategies at the pharse level with Max A verbal cues to achieve 75% intelligibility.   Skilled Therapeutic Interventions: Pt was seen for skilled ST targeting goals for dysphagia and cognition.  Pt was requesting water upon therapist's arrival; therefore, therapist facilitated the session with trials of ice chips and thin liquids following oral care to continue working towards diet progression.  Pt demonstrated no overt s/s of aspiration with ice chips but needed mod assist multimodal cues for initiation and sequencing of self feeding.  Pt demonstrated immediate reflexive cough following initial cup sip of thin liquids due to delay in swallow response and disorganized oral phase.  Overall, pt needed mod assist multimodal cues to attend to therapist when standing on his left side throughout therapy session.  Pt was able to sustain his attention to tasks for ~30 seconds before requiring mod assist multimodal cues for  redirection.  Pt left in bed with bed alarm set and wife at bedside.  Continue per current plan of care.       Function:  Eating Eating   Modified Consistency Diet: Yes Eating Assist Level: Set up assist for;Helper checks for pocketed food;Helper scoops food on utensil;Helper brings food to mouth;Hand over hand assist   Eating Set Up Assist For: Opening containers       Cognition Comprehension Comprehension assist level: Understands basic 75 - 89% of the time/ requires cueing 10 - 24% of the time  Expression   Expression assist level: Expresses basic 75 - 89% of the time/requires cueing 10 - 24% of the time. Needs helper to occlude trach/needs to repeat words.  Social Interaction Social Interaction assist level: Interacts appropriately 90% of the time - Needs monitoring or encouragement for participation or interaction.  Problem Solving Problem solving assist level: Solves basic 25 - 49% of the time - needs direction more than half the time to initiate, plan or complete simple activities  Memory Memory assist level: Recognizes or recalls 25 - 49% of the time/requires cueing 50 - 75% of the time    Pain Pain Assessment Pain Assessment: No/denies pain  Therapy/Group: Individual Therapy  Lamisha Roussell, Selinda Orion 11/04/2016, 5:05 PM

## 2016-11-04 NOTE — Progress Notes (Signed)
Physical Therapy Session Note  Patient Details  Name: [name] Cook MRN: [id number] Date of Birth: [birth year]  Today's Date: 11/04/2016 PT Individual Time: 1305-1350 PT Individual Time Calculation (min): 45 min    Short Term Goals: Week 1:  PT Short Term Goal 1 (Week 1): Patient will perform bed mobility with max A.  PT Short Term Goal 2 (Week 1): Patient will perform transfers with assist of 1 person.  PT Short Term Goal 3 (Week 1): Patient will maintain static sitting balance with min A x 5 min.  PT Short Term Goal 4 (Week 1): Patient will initiate wheelchair propulsion.   Skilled Therapeutic Interventions/Progress Updates:    Pt resting in bed on arrival, no c/o pain, agreeable to therapy session.  Session focus on bed mobility, static/dynamic sitting balance seated EOB, posture, awareness, and attention.  Pt transitioned supine>sit to L side with max assist for LEs and to elevate trunk.  Max multimodal cues for midline orientation seated EOB while pt engaged in RUE reaching task to facilitate upright posture, R weight shift, midline orientation, and attention to task.  Pt reporting need to urinate and also noted to be (+) for BM, so returned to supine.  Bridging and rolling R and L with mod assist and max multimodal cues for sequencing, problem solving, and L attention during hygiene, doffing soiled brief and donning clean brief.  Pt remained in bed for remained of session, focus on re-creating a peg board pattern from a picture with mod multimodal cues.  Pt left semi-reclined in bed with call bell in reach and needs met. Wife present and agreeable to raise 3rd bed rail upon her exit.    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   Therapy/Group: Individual Therapy  Tyree Vandruff E Penven-Crew 11/04/2016, 3:07 PM

## 2016-11-05 ENCOUNTER — Inpatient Hospital Stay (HOSPITAL_COMMUNITY): Payer: Medicare Other | Admitting: Physical Therapy

## 2016-11-05 ENCOUNTER — Inpatient Hospital Stay (HOSPITAL_COMMUNITY): Payer: Medicare Other | Admitting: Speech Pathology

## 2016-11-05 ENCOUNTER — Inpatient Hospital Stay (HOSPITAL_COMMUNITY): Payer: Medicare Other | Admitting: Occupational Therapy

## 2016-11-05 DIAGNOSIS — I160 Hypertensive urgency: Secondary | ICD-10-CM | POA: Insufficient documentation

## 2016-11-05 LAB — GLUCOSE, CAPILLARY
GLUCOSE-CAPILLARY: 150 mg/dL — AB (ref 65–99)
GLUCOSE-CAPILLARY: 123 mg/dL — AB (ref 65–99)
Glucose-Capillary: 148 mg/dL — ABNORMAL HIGH (ref 65–99)
Glucose-Capillary: 115 mg/dL — ABNORMAL HIGH (ref 65–99)

## 2016-11-05 MED ORDER — LISINOPRIL 10 MG PO TABS
10.0000 mg | ORAL_TABLET | Freq: Every day | ORAL | Status: DC
  Administered 2016-11-06 – 2016-11-08 (×3): 10 mg via ORAL
  Filled 2016-11-05 (×3): qty 1

## 2016-11-05 MED ORDER — LISINOPRIL 5 MG PO TABS
5.0000 mg | ORAL_TABLET | Freq: Once | ORAL | Status: AC
  Administered 2016-11-05: 5 mg via ORAL
  Filled 2016-11-05: qty 1

## 2016-11-05 MED ORDER — AMLODIPINE BESYLATE 5 MG PO TABS
5.0000 mg | ORAL_TABLET | Freq: Every day | ORAL | Status: DC
  Administered 2016-11-05 – 2016-11-06 (×2): 5 mg via ORAL
  Filled 2016-11-05 (×2): qty 1

## 2016-11-05 NOTE — Progress Notes (Signed)
Speech Language Pathology Daily Session Note  Patient Details  Name: [name] Cook MRN: [id number] Date of Birth: 12/01/1940  Today's Date: 11/05/2016 SLP Individual Time: [id number] SLP Individual Time Calculation (min): 40 min   Short Term Goals:Week 1: SLP Short Term Goal 1 (Week 1): Pt will conssitently demonstrate orientation x 4 with Min A verbal cues.  SLP Short Term Goal 2 (Week 1): Pt will attend to basic, familiar tasks for ~5 minutes intervals with Mod A verbal cues for redirection.  SLP Short Term Goal 3 (Week 1): Pt will attend to left field of environment during functional tasks with Mod A verbal cues.  SLP Short Term Goal 4 (Week 1): Pt will consume current diet without overt s/s of aspiration with Mod A verbal cues for use of swallowing compensatory strategies.  SLP Short Term Goal 5 (Week 1): Pt will consume trials of nectar thick liquids without overt s/s of aspiration with Mod A verbal cues .  SLP Short Term Goal 6 (Week 1): Pt will utilize speech intelligibility strategies at the pharse level with Max A verbal cues to achieve 75% intelligibility.   Skilled Therapeutic Interventions: :Pt was seen for skilled ST targeting cognitive and dysphagia goals.  Pt was eating lunch upon therapist's arrival and needed mod assist multimodal cues for initiation and sequencing of self feeding.  Therapist was able to fade cues to min assist multimodal cues by reducing the number of items on his meal tray to limit visual distractions.  Pt needed min-mod assist verbal cues for attention to boluses due to intermittent periods of oral holding.  Pt demonstrated no overt s/s of aspiration with dys 1 textures and honey thick liquids.  Pt continues to request a diet upgrade but does not appear appropriate at this time given significant cognitive impairment.  SLP explained rationale behind currently prescribed diet given results of MBS.  Pt verbalized understanding but therapist will continue to  reinforce.  Pt was left in wheelchair with quick release belt donned and call bell within reach.  Continue per current plan of care.       Function:  Eating Eating   Modified Consistency Diet: Yes Eating Assist Level: Set up assist for;Helper checks for pocketed food;Supervision or verbal cues   Eating Set Up Assist For: Opening containers       Cognition Comprehension Comprehension assist level: Understands basic 75 - 89% of the time/ requires cueing 10 - 24% of the time  Expression   Expression assist level: Expresses basic 75 - 89% of the time/requires cueing 10 - 24% of the time. Needs helper to occlude trach/needs to repeat words.  Social Interaction Social Interaction assist level: Interacts appropriately 50 - 74% of the time - May be physically or verbally inappropriate.  Problem Solving Problem solving assist level: Solves basic 25 - 49% of the time - needs direction more than half the time to initiate, plan or complete simple activities  Memory Memory assist level: Recognizes or recalls 25 - 49% of the time/requires cueing 50 - 75% of the time    Pain Pain Assessment Pain Assessment: No/denies pain  Therapy/Group: Individual Therapy  Tenesia Escudero, Selinda Orion 11/05/2016, 1:44 PM

## 2016-11-05 NOTE — Progress Notes (Signed)
Tech notified RN at [facility] of BP 200/70 and checked twice. RN checked manually at 0458 and it was 190/90. [name] Phenix, NP and she looked over chart and advised to give the morning dose of 2.5mg  lisinopril and she will notify Dr. Posey Pronto. RN will check BP again after an hour of given the med.

## 2016-11-05 NOTE — Progress Notes (Signed)
[facility]     PROGRESS NOTE    Subjective/Complaints: Pt laying in bed this AM.  He does not have his hand or foot brace on.  He states he slept well overnight.    ROS: Denies CP, SOB, nausea, vomiting, diarrhea.  Objective: Vital Signs: Blood pressure (!) 177/148, pulse 77, temperature 97.9 F (36.6 C), temperature source Oral, resp. rate 18, height 5\' 8"  (1.727 m), weight 90.4 kg (199 lb 6.4 oz), SpO2 95 %. No results found. No results for input(s): WBC, HGB, HCT, PLT in the last 72 hours. No results for input(s): NA, K, CL, GLUCOSE, BUN, CREATININE, CALCIUM in the last 72 hours.  Invalid input(s): CO CBG (last 3)   Recent Labs  11/04/16 1707 11/04/16 2036 11/05/16 0629  GLUCAP 142* 170* 148*    Wt Readings from Last 3 Encounters:  10/30/16 90.4 kg (199 lb 6.4 oz)  10/28/16 92 kg (202 lb 12.8 oz)  09/20/16 95.8 kg (211 lb 4 oz)    Physical Exam:  Constitutional: NAD. Vital signs reviewed.   HENT: Normocephalic and atraumatic.  Eyes: EOM are normal. No discharge.  Cardiovascular: RRR, +murmur. No JVD. Respiratory: clear. Unlabored GI: Soft. Bowel sounds are normal. Musculoskeletal: He exhibits no edema. He exhibits no tenderness.  RLE BKA healed Neurological: He is alert.  A&Ox3 with cues Right gaze preference with left inattention (?resolved).  Mild dysarthria Left facial weakness Motor: LUE/LLE: 0/5. No increase in tone noted. RUE: 5/5 proximal to distal RLE: HF 5/5   Skin: Skin is warm and dry and intact.  Psychiatric: Flat.   Assessment/Plan: 1. Functional deficits secondary to right basal ganglia infarct which require 3+ hours per day of interdisciplinary therapy in a comprehensive inpatient rehab setting. Physiatrist is providing close team supervision and 24 hour management of active medical problems listed below. Physiatrist and rehab team continue to assess barriers to discharge/monitor patient progress toward  functional and medical goals.  Function:  Bathing Bathing position   Position: Bed  Bathing parts Body parts bathed by patient: Left arm, Chest, Abdomen, Front perineal area Body parts bathed by helper: Right arm, Buttocks, Right upper leg, Left upper leg, Left lower leg, Back  Bathing assist Assist Level: 2 helpers      Upper Body Dressing/Undressing Upper body dressing   What is the patient wearing?: Pull over shirt/dress     Pull over shirt/dress - Perfomed by patient: Thread/unthread right sleeve, Put head through opening Pull over shirt/dress - Perfomed by helper: Thread/unthread left sleeve, Pull shirt over trunk        Upper body assist Assist Level: 2 helpers, Touching or steadying assistance(Pt > 75%)      Lower Body Dressing/Undressing Lower body dressing   What is the patient wearing?: Pants, Socks, Shoes, AFO       Pants- Performed by helper: Thread/unthread right pants leg, Thread/unthread left pants leg, Pull pants up/down       Socks - Performed by helper: Don/doff left sock   Shoes - Performed by helper: Don/doff left shoe, Fasten left   AFO - Performed by helper: Don/doff right AFO      Lower body assist Assist for lower body dressing: 2 Helpers      Toileting Toileting Toileting activity did not occur: No continent bowel/bladder event   Toileting steps completed by helper: Adjust clothing prior to toileting, Performs perineal hygiene, Adjust clothing after toileting Toileting Assistive Devices: Toilet aid  Toileting assist Assist level: Two  helpers   Transfers Chair/bed transfer   Chair/bed transfer method: Lateral scoot (sliding board) Chair/bed transfer assist level: 2 helpers Chair/bed transfer assistive device: Sliding board, Armrests Mechanical lift: Chiropractor Ambulation activity did not occur: Safety/medical concerns         Wheelchair   Type: Manual   Assist Level: Dependent (Pt equals 0%)   Cognition Comprehension Comprehension assist level: Understands basic 25 - 49% of the time/ requires cueing 50 - 75% of the time  Expression Expression assist level: Expresses basic 50 - 74% of the time/requires cueing 25 - 49% of the time. Needs to repeat parts of sentences.  Social Interaction Social Interaction assist level: Interacts appropriately 50 - 74% of the time - May be physically or verbally inappropriate.  Problem Solving Problem solving assist level: Solves basic less than 25% of the time - needs direction nearly all the time or does not effectively solve problems and may need a restraint for safety  Memory Memory assist level: Recognizes or recalls less than 25% of the time/requires cueing greater than 75% of the time   Medical Problem List and Plan: 1.  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  DVT Prophylaxis/Anticoagulation: Pharmaceutical: Lovenox 3. Pain Management: tylenol prn. Continue to educate patient family on LUE positioning.  4. Mood: LCSW to follow for evaluation and support.  5. Neuropsych: This patient is not fully capable of making decisions on his own behalf. 6. Skin/Wound Care: routine pressure relief measures 7. Fluids/Electrolytes/Nutrition: Monitor I/O.   Monitor hydration on honey liquids- concerns of adequate intake with ongoing hiccups and coughing due to difficulty with secretions.   Labs ordered for tomorrow 8. HTN: Monitor BP. Goal of BP 130-150--avoid hypotension  Lisinopril 2.5 started 12/11, increased to 7.5 on 12/12, increased to 10 on 12/13  Hypertensive urgency 12/12 Vitals:   11/05/16 0818 11/05/16 0855  BP: (!) 199/84 (!) 177/148  Pulse:    Resp:    Temp:     9. 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  11. Hiccups:   Baclofen increased to  10mg  QID monitor for sedation, ?resolved. 12. GERD: On Zantac bid. 13. Pre-diabtes: Hgb A1c- 5.9. CBG (last 3)   Recent Labs  11/04/16 1707 11/04/16 2036 11/05/16 0629  GLUCAP 142* 170* 148*   Relatively controlled 12/12  Continue to monitor   LOS (Days) 6 A FACE TO FACE EVALUATION WAS PERFORMED  Ankit Lorie Phenix 11/05/2016 10:05 AM

## 2016-11-05 NOTE — Progress Notes (Signed)
Occupational Therapy Session Note  Patient Details  Name: [name] Cook MRN: [id number] Date of Birth: July 04, 1941  Today's Date: 11/05/2016 OT Individual Time: 1100-1204 OT Individual Time Calculation (min): 64 min  Short Term Goals: Week 1:  OT Short Term Goal 1 (Week 1): Pt will maintan sitting balance with min A during simple ADL task OT Short Term Goal 2 (Week 1): Pt will transfer with mod A +2 with LRAD bed/ chair in prep for ADL task OT Short Term Goal 3 (Week 1): Pt will demonstrate safe management of left UE with bed mobility with mod cuing  OT Short Term Goal 4 (Week 1): Pt will roll for LB clothing management with mod A with bed rails   Skilled Therapeutic Interventions/Progress Updates:    OT session focused on bed mobility, attention to L, L NMR, and transfers. Bed level bathing/dresssing completed with max multimodal cues to maintain attention to task and Max cues to attend to L side.  NMR techniques utilized to encourage pushing/pulling with L UE to wash R. Total A +2 LB ADLs. Pt able to follow one-step commands with Max demonsrational cues in minimally distracting environment. Perseverative on where wife is today. Total A +2 sliding board transfer to w/c. Demonstrated some awareness of oral secretions and asked for a wash cloth to wipe his face w/ Set-up. Pt left in TIS w/c with safety belt on and needs met.   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   Therapy/Group: Individual Therapy  Valma Cava 11/05/2016, 12:51 PM

## 2016-11-05 NOTE — Progress Notes (Signed)
Physical Therapy Session Note  Patient Details  Name: [name] Cook MRN: [id number] Date of Birth: Sep 25, 1941  Today's Date: 11/05/2016 PT Individual Time: [id number] and 1415-1445 PT Individual Time Calculation (min): 55 min and 30 min   Short Term Goals: Week 1:  PT Short Term Goal 1 (Week 1): Patient will perform bed mobility with max A.  PT Short Term Goal 2 (Week 1): Patient will perform transfers with assist of 1 person.  PT Short Term Goal 3 (Week 1): Patient will maintain static sitting balance with min A x 5 min.  PT Short Term Goal 4 (Week 1): Patient will initiate wheelchair propulsion.   Skilled Therapeutic Interventions/Progress Updates:    Treatment 1: Patient asleep in bed upon arrival. Patient perseverative with increased difficulty following basic mobility commands and demonstrating focused attention only this date. Session focused on attention to task, command following, attending to L, LLE and L cervical rotation and lateral side bending PROM in supine, bed mobility, sitting balance, and decreasing pushing tendencies to L. Patient required max multimodal cues and more than reasonable time to complete all tasks for washing face sitting up in bed with setup assist, transferring to EOB with HOB raised and rail to L with total A, and static sitting balance EOB with max-total A with B feet supported and hand over hand assist for placement of RUE with patient able to verbalize which direction he was losing balance (anterior, posterior, to L) but unable to correct. Patient required total A to don R prosthesis and L sock/shoe. See vitals assessed below. Patient returned to bed and repositioned with total A x 2. Patient left semi reclined in bed with all needs in reach.   Supine at rest BP 199/84 Sitting EOB 177/148 RN Ed made aware.  Treatment 2: Patient sitting in TIS wheelchair upon arrival, BP at rest 123/84. Session focused on increasing L attention with Dynavision mode A lower  quadrants only within first 4 rings from wheelchair level with max verbal/auditory cues with sustained attention between 3-5 seconds, retrieving objects from L using RUE, and visual scanning to L to locate and read hallway signs with max multimodal cues. Patient labile throughout session, emotional support provided and internally distracted by absence of wife during session. Patient left sitting in TIS wheelchair positioned to promote scanning to L with wife and friend in room.   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   Therapy/Group: Individual Therapy  Laretta Alstrom 11/05/2016, 8:57 AM

## 2016-11-05 NOTE — Progress Notes (Signed)
BP at 0620 on the left arm was 200/69. At 0622 on the right arm BP was 191/70. Pt still has no HA or c/o pain. Bedside update with Linna Hoff, PA and they will go over pt meds this morning.

## 2016-11-06 ENCOUNTER — Inpatient Hospital Stay (HOSPITAL_COMMUNITY): Payer: Medicare Other | Admitting: Physical Therapy

## 2016-11-06 ENCOUNTER — Inpatient Hospital Stay (HOSPITAL_COMMUNITY): Payer: Medicare Other | Admitting: *Deleted

## 2016-11-06 ENCOUNTER — Inpatient Hospital Stay (HOSPITAL_COMMUNITY): Payer: Medicare Other | Admitting: Speech Pathology

## 2016-11-06 ENCOUNTER — Inpatient Hospital Stay (HOSPITAL_COMMUNITY): Payer: Medicare Other | Admitting: Occupational Therapy

## 2016-11-06 DIAGNOSIS — E870 Hyperosmolality and hypernatremia: Secondary | ICD-10-CM

## 2016-11-06 DIAGNOSIS — E876 Hypokalemia: Secondary | ICD-10-CM

## 2016-11-06 DIAGNOSIS — I6340 Cerebral infarction due to embolism of unspecified cerebral artery: Secondary | ICD-10-CM

## 2016-11-06 DIAGNOSIS — I6350 Cerebral infarction due to unspecified occlusion or stenosis of unspecified cerebral artery: Secondary | ICD-10-CM

## 2016-11-06 DIAGNOSIS — G8100 Flaccid hemiplegia affecting unspecified side: Secondary | ICD-10-CM

## 2016-11-06 LAB — CBC
HCT: 40.4 % (ref 39.0–52.0)
HEMOGLOBIN: 13.6 g/dL (ref 13.0–17.0)
MCH: 33.4 pg (ref 26.0–34.0)
MCHC: 33.7 g/dL (ref 30.0–36.0)
MCV: 99.3 fL (ref 78.0–100.0)
Platelets: 339 10*3/uL (ref 150–400)
RBC: 4.07 MIL/uL — ABNORMAL LOW (ref 4.22–5.81)
RDW: 13 % (ref 11.5–15.5)
WBC: 8.5 10*3/uL (ref 4.0–10.5)

## 2016-11-06 LAB — GLUCOSE, CAPILLARY
Glucose-Capillary: 135 mg/dL — ABNORMAL HIGH (ref 65–99)
Glucose-Capillary: 131 mg/dL — ABNORMAL HIGH (ref 65–99)
Glucose-Capillary: 132 mg/dL — ABNORMAL HIGH (ref 65–99)
Glucose-Capillary: 136 mg/dL — ABNORMAL HIGH (ref 65–99)

## 2016-11-06 LAB — BASIC METABOLIC PANEL
ANION GAP: 10 (ref 5–15)
BUN: 29 mg/dL — ABNORMAL HIGH (ref 6–20)
CALCIUM: 8.7 mg/dL — AB (ref 8.9–10.3)
CO2: 25 mmol/L (ref 22–32)
CREATININE: 1.06 mg/dL (ref 0.61–1.24)
Chloride: 114 mmol/L — ABNORMAL HIGH (ref 101–111)
GFR calc Af Amer: >60 mL/min (ref >60)
GLUCOSE: 143 mg/dL — AB (ref 65–99)
Potassium: 3.4 mmol/L — ABNORMAL LOW (ref 3.5–5.1)
Sodium: 149 mmol/L — ABNORMAL HIGH (ref 135–145)

## 2016-11-06 MED ORDER — BACLOFEN 5 MG HALF TABLET
5.0000 mg | ORAL_TABLET | Freq: Four times a day (QID) | ORAL | Status: DC
  Administered 2016-11-06 – 2016-11-11 (×20): 5 mg via ORAL
  Filled 2016-11-06 (×20): qty 1

## 2016-11-06 MED ORDER — SODIUM CHLORIDE 0.45 % IV SOLN
INTRAVENOUS | Status: AC
  Administered 2016-11-06 – 2016-11-13 (×9): via INTRAVENOUS
  Filled 2016-11-06 (×15): qty 1000

## 2016-11-06 NOTE — Progress Notes (Signed)
Pt now has on the boot and splint.

## 2016-11-06 NOTE — Progress Notes (Signed)
Physical Therapy Session Note  Patient Details  Name: [name] Cook MRN: [id number] Date of Birth: 1941/02/08  Today's Date: 11/06/2016 PT Individual Time: 1100-1200 and 1550-1619 PT Individual Time Calculation (min): 60 min and 29 min   Short Term Goals: Week 1:  PT Short Term Goal 1 (Week 1): Patient will perform bed mobility with max A.  PT Short Term Goal 2 (Week 1): Patient will perform transfers with assist of 1 person.  PT Short Term Goal 3 (Week 1): Patient will maintain static sitting balance with min A x 5 min.  PT Short Term Goal 4 (Week 1): Patient will initiate wheelchair propulsion.   Skilled Therapeutic Interventions/Progress Updates:    Treatment 1: Patient in [employer] wheelchair upon arrival with wife present to observe session. Performed slide board transfer wheelchair <> mat table with total A x 2 with patient initiating < 5% of transfers. Session focused on focused > sustained attention, midline reorientation, neuro re-ed, and L attention with static sitting balance EOM with wedge under L hip to promote L lateral trunk shortening and R lateral trunk elongation for improved postural control with mirror for visual feedback and RUE support. Patient requires max-total A for balance with brief periods of supervision-min A with no attempts to correct LOB. Patient requires max-total multimodal cues to sustain attention to up 5 sec. Adjusted wheelchair back to promote improved sitting tolerance and upright posture. Patient left sitting in TIS wheelchair with quick release belt donned and wife present.     Treatment 2: Patient in bed upon arrival with wife present. Patient instructed in supine RLE NMR with max verbal/tactile cues with resisted gross LLE extension to fatigue, hooklying hip adduction with no active movement noted, and bridging with glute activation noted and PROM LLE for muscle extensibility. Patient required hand over hand assist for rolling to R and L using rails with max  A overall and repositioned in bed with total A x 2. Patient required max cues for attention to task and visual scanning to midline and L. Patient left semi reclined in bed with wife present.   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   Therapy/Group: Individual Therapy  Borden Thune, Murray Hodgkins 11/06/2016, 12:12 PM

## 2016-11-06 NOTE — Progress Notes (Signed)
Occupational Therapy Session Note  Patient Details  Name: Deniz Tweedy MRN: [id number] Date of Birth: 1941/05/01  Today's Date: 11/06/2016 OT Individual Time: 0900-1000 OT Individual Time Calculation (min): 60 min   Short Term Goals: Week 1:  OT Short Term Goal 1 (Week 1): Pt will maintan sitting balance with min A during simple ADL task OT Short Term Goal 2 (Week 1): Pt will transfer with mod A +2 with LRAD bed/ chair in prep for ADL task OT Short Term Goal 3 (Week 1): Pt will demonstrate safe management of left UE with bed mobility with mod cuing  OT Short Term Goal 4 (Week 1): Pt will roll for LB clothing management with mod A with bed rails   Skilled Therapeutic Interventions/Progress Updates:    Pt with focused attention this morning w/ delayed response to commands, following only 50% with Max multimodal cues in moderately distracting environment.  Total A to don prosthesis, max multimodal cues to sequence dressing at EOB. Total A for UB dressing with max A to maintain sitting balance. Give-mor sling donned at EOB  for flaccid L UE support. Slide-board transfer >w/c > R. Max cues to keep R hand onlap 2/2 pt push w/c away when grabs for w/c armrest. Total A +2 transfer to TIS w/c. Perseverative on wife in room and unable to follow commands with spouse present.  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   Therapy/Group: Individual Therapy  Valma Cava 11/06/2016, 4:16 PM

## 2016-11-06 NOTE — Progress Notes (Signed)
PHYSICAL MEDICINE & REHABILITATION     PROGRESS NOTE    Subjective/Complaints: Pt seen laying in bed this AM.  He still does not have his braces on this AM.  He states he slept well overnight.   ROS: DeniesCP, SOB, nausea, vomiting, diarrhea.  Objective: Vital Signs: Blood pressure (!) 160/76, pulse 88, temperature 98.6 F (37 C), temperature source Axillary, resp. rate 20, height 5\' 8"  (1.727 m), weight 85.1 kg (187 lb 11.2 oz), SpO2 93 %. No results found.  Recent Labs  11/06/16 0617  WBC 8.5  HGB 13.6  HCT 40.4  PLT 339    Recent Labs  11/06/16 0617  NA 149*  K 3.4*  CL 114*  GLUCOSE 143*  BUN 29*  CREATININE 1.06  CALCIUM 8.7*   CBG (last 3)   Recent Labs  11/05/16 1631 11/05/16 2041 11/06/16 0631  GLUCAP 150* 123* 135*    Wt Readings from Last 3 Encounters:  11/06/16 85.1 kg (187 lb 11.2 oz)  10/28/16 92 kg (202 lb 12.8 oz)  09/20/16 95.8 kg (211 lb 4 oz)    Physical Exam:  Constitutional: NAD. Vital signs reviewed.   HENT: Normocephalic and atraumatic.  Eyes: EOM are normal. No discharge.  Cardiovascular: RRR, +murmur. No JVD. Respiratory: clear. Unlabored GI: Soft. Bowel sounds are normal. Musculoskeletal: He exhibits no edema. He exhibits no tenderness.  RLE BKA healed Neurological: He is alert.  A&Ox3 with cues Mild dysarthria Left facial weakness Motor: LUE/LLE: 0/5. mAS 1/4 with finger flexors. RUE: 5/5 proximal to distal RLE: HF 5/5   Skin: Skin is warm and dry and intact.  Psychiatric: Flat.   Assessment/Plan: 1. Functional deficits secondary to right basal ganglia infarct which require 3+ hours per day of interdisciplinary therapy in a comprehensive inpatient rehab setting. Physiatrist is providing close team supervision and 24 hour management of active medical problems listed below. Physiatrist and rehab team continue to assess barriers to discharge/monitor patient progress toward functional and medical  goals.  Function:  Bathing Bathing position   Position: Bed  Bathing parts Body parts bathed by patient: Left arm, Abdomen, Chest, Right upper leg, Left upper leg, Front perineal area Body parts bathed by helper: Right arm, Buttocks, Left lower leg, Back  Bathing assist Assist Level: 2 helpers      Upper Body Dressing/Undressing Upper body dressing   What is the patient wearing?: Pull over shirt/dress     Pull over shirt/dress - Perfomed by patient: Thread/unthread right sleeve, Put head through opening Pull over shirt/dress - Perfomed by helper: Thread/unthread right sleeve, Thread/unthread left sleeve, Put head through opening, Pull shirt over trunk        Upper body assist Assist Level: 2 helpers      Lower Body Dressing/Undressing Lower body dressing   What is the patient wearing?: Pants, Socks, Shoes, AFO       Pants- Performed by helper: Thread/unthread right pants leg, Thread/unthread left pants leg, Pull pants up/down       Socks - Performed by helper: Don/doff left sock   Shoes - Performed by helper: Don/doff left shoe, Fasten left   AFO - Performed by helper: Don/doff right AFO      Lower body assist Assist for lower body dressing: 2 Helpers      Toileting Toileting Toileting activity did not occur: No continent bowel/bladder event   Toileting steps completed by helper: Adjust clothing prior to toileting, Performs perineal hygiene, Adjust clothing after toileting Toileting Assistive Devices:  Toilet aid  Toileting assist Assist level: Two helpers   Transfers Chair/bed transfer   Chair/bed transfer method: Lateral scoot (sliding board) Chair/bed transfer assist level: 2 helpers Chair/bed transfer assistive device: Sliding board, Armrests Mechanical lift: Maximove   Locomotion Ambulation Ambulation activity did not occur: Safety/medical concerns         Wheelchair   Type: Manual   Assist Level: Dependent (Pt equals 0%)   Cognition Comprehension Comprehension assist level: Understands basic 75 - 89% of the time/ requires cueing 10 - 24% of the time  Expression Expression assist level: Expresses basic 75 - 89% of the time/requires cueing 10 - 24% of the time. Needs helper to occlude trach/needs to repeat words.  Social Interaction Social Interaction assist level: Interacts appropriately 50 - 74% of the time - May be physically or verbally inappropriate.  Problem Solving Problem solving assist level: Solves basic 25 - 49% of the time - needs direction more than half the time to initiate, plan or complete simple activities  Memory Memory assist level: Recognizes or recalls 25 - 49% of the time/requires cueing 50 - 75% of the time   Medical Problem List and Plan: 1.  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  DVT Prophylaxis/Anticoagulation: Pharmaceutical: Lovenox 3. Pain Management: tylenol prn. Continue to educate patient family on LUE positioning.  4. Mood: LCSW to follow for evaluation and support.  5. Neuropsych: This patient is not fully capable of making decisions on his own behalf. 6. Skin/Wound Care: routine pressure relief measures 7. Fluids/Electrolytes/Nutrition: Monitor I/O.   Monitor hydration on honey liquids- concerns of adequate intake with ongoing hiccups and coughing due to difficulty with secretions.  8. HTN: Monitor BP. Goal of BP 130-150--avoid hypotension  Lisinopril 2.5 started 12/11, increased to 7.5 on 12/12, increased to 10 on 12/13  Norvasc 5mg  started 12/12  Improving  Hypertensive urgency Vitals:   11/06/16 0423 11/06/16 0910  BP: (!) 180/64 (!) 160/76  Pulse: 88   Resp: 20   Temp: 98.6 F (37 C)    9. 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  11. Hiccups:   Baclofen increased to  10mg  QID monitor for sedation, ?resolved. 12. GERD: On Zantac bid. 13. Pre-diabtes: Hgb A1c- 5.9. CBG (last 3)   Recent Labs  11/05/16 1631 11/05/16 2041 11/06/16 0631  GLUCAP 150* 123* 135*   Relatively controlled 12/13  Continue to monitor 14. Mild hyponatremia  K+ 3.4 on 12/13  Will cont to monitor 15. Elevated BUN/Cr  Will start IVF at night 16. Hypernatremia  Na+ 149 on 12/13  Cont to monitor    LOS (Days) 7 A FACE TO FACE EVALUATION WAS PERFORMED  Marsha Gundlach Lorie Phenix 11/06/2016 12:01 PM

## 2016-11-06 NOTE — Progress Notes (Signed)
Patient on honey liquids with resultant renal insufficiency due to dislike--will add nocturnal IVF with KCL for hydration. Recheck labs Friday.

## 2016-11-06 NOTE — Progress Notes (Signed)
Speech Language Pathology Daily Session Note  Patient Details  Name: [name] Cook MRN: [id number] Date of Birth: [birth year]  Today's Date: 11/06/2016 SLP Individual Time: 1305-1400 SLP Individual Time Calculation (min): 55 min   Short Term Goals: Week 1: SLP Short Term Goal 1 (Week 1): Pt will conssitently demonstrate orientation x 4 with Min A verbal cues.  SLP Short Term Goal 2 (Week 1): Pt will attend to basic, familiar tasks for ~5 minutes intervals with Mod A verbal cues for redirection.  SLP Short Term Goal 3 (Week 1): Pt will attend to left field of environment during functional tasks with Mod A verbal cues.  SLP Short Term Goal 4 (Week 1): Pt will consume current diet without overt s/s of aspiration with Mod A verbal cues for use of swallowing compensatory strategies.  SLP Short Term Goal 5 (Week 1): Pt will consume trials of nectar thick liquids without overt s/s of aspiration with Mod A verbal cues .  SLP Short Term Goal 6 (Week 1): Pt will utilize speech intelligibility strategies at the pharse level with Max A verbal cues to achieve 75% intelligibility.   Skilled Therapeutic Interventions:  Pt was seen for skilled ST targeting cognitive goals.  Pt with thick white secretions at the corner of his mouth. He needed max assist multimodal cues for initiation and sequencing of oral care.  Therapist facilitated the session with a basic sorting task targeting attention to task and visual scanning to the left of midline.  Pt required mod-max assist multimodal cues to sort items on the left of midline and was able to sustain his attention to task for 15 second intervals with max assist verbal cues for redirection.  Pt was returned to room and left in wheelchair with wife at bedside and quick release belt donned for safety.  Continue per current plan of care.    Function:  Eating Eating            Cognition Comprehension Comprehension assist level: Understands basic 75 - 89% of  the time/ requires cueing 10 - 24% of the time  Expression   Expression assist level: Expresses basic 75 - 89% of the time/requires cueing 10 - 24% of the time. Needs helper to occlude trach/needs to repeat words.  Social Interaction Social Interaction assist level: Interacts appropriately 25 - 49% of time - Needs frequent redirection.  Problem Solving Problem solving assist level: Solves basic 25 - 49% of the time - needs direction more than half the time to initiate, plan or complete simple activities  Memory Memory assist level: Recognizes or recalls 25 - 49% of the time/requires cueing 50 - 75% of the time    Pain Pain Assessment Pain Assessment: No/denies pain  Therapy/Group: Individual Therapy  Arra Connaughton, Selinda Orion 11/06/2016, 2:16 PM

## 2016-11-06 NOTE — Progress Notes (Signed)
Pt refused the boot and splint. Pt was educated on the purpose and he understood. Pt still refused. Will pass on in shift change.

## 2016-11-07 ENCOUNTER — Inpatient Hospital Stay (HOSPITAL_COMMUNITY): Payer: Medicare Other | Admitting: Speech Pathology

## 2016-11-07 ENCOUNTER — Inpatient Hospital Stay (HOSPITAL_COMMUNITY): Payer: Medicare Other | Admitting: Occupational Therapy

## 2016-11-07 ENCOUNTER — Inpatient Hospital Stay (HOSPITAL_COMMUNITY): Payer: Medicare Other | Admitting: Physical Therapy

## 2016-11-07 ENCOUNTER — Ambulatory Visit (HOSPITAL_COMMUNITY): Payer: Medicare Other | Admitting: Physical Therapy

## 2016-11-07 DIAGNOSIS — R0989 Other specified symptoms and signs involving the circulatory and respiratory systems: Secondary | ICD-10-CM

## 2016-11-07 LAB — GLUCOSE, CAPILLARY
GLUCOSE-CAPILLARY: 148 mg/dL — AB (ref 65–99)
GLUCOSE-CAPILLARY: 155 mg/dL — AB (ref 65–99)
Glucose-Capillary: 141 mg/dL — ABNORMAL HIGH (ref 65–99)
Glucose-Capillary: 176 mg/dL — ABNORMAL HIGH (ref 65–99)

## 2016-11-07 MED ORDER — AMLODIPINE BESYLATE 10 MG PO TABS
10.0000 mg | ORAL_TABLET | Freq: Every day | ORAL | Status: DC
  Administered 2016-11-07 – 2016-11-20 (×14): 10 mg via ORAL
  Filled 2016-11-07 (×14): qty 1

## 2016-11-07 NOTE — Progress Notes (Signed)
Speech Language Pathology Weekly Progress and Session Note  Patient Details  Name: [name] Cook MRN: [id number] Date of Birth: 1941-10-19  Beginning of progress report period: October 31, 2016  End of progress report period: November 07, 2016   Today's Date: 11/07/2016 SLP Individual Time: 0905-1000 SLP Individual Time Calculation (min): 55 min   Short Term Goals:Week 1: SLP Short Term Goal 1 (Week 1): Pt will conssitently demonstrate orientation x 4 with Min A verbal cues.  SLP Short Term Goal 1 - Progress (Week 1): Met SLP Short Term Goal 2 (Week 1): Pt will attend to basic, familiar tasks for ~5 minutes intervals with Mod A verbal cues for redirection.  SLP Short Term Goal 2 - Progress (Week 1): Progressing toward goal SLP Short Term Goal 3 (Week 1): Pt will attend to left field of environment during functional tasks with Mod A verbal cues.  SLP Short Term Goal 3 - Progress (Week 1): Progressing toward goal SLP Short Term Goal 4 (Week 1): Pt will consume current diet without overt s/s of aspiration with Mod A verbal cues for use of swallowing compensatory strategies.  SLP Short Term Goal 4 - Progress (Week 1): Met SLP Short Term Goal 5 (Week 1): Pt will consume trials of nectar thick liquids without overt s/s of aspiration with Mod A verbal cues .  SLP Short Term Goal 5 - Progress (Week 1): Progressing toward goal SLP Short Term Goal 6 (Week 1): Pt will utilize speech intelligibility strategies at the pharse level with Max A verbal cues to achieve 75% intelligibility.  SLP Short Term Goal 6 - Progress (Week 1): Other (comment) (not addressed this reporting period to alow focus on cognition and swallowing goals.  )    New Short Term Goals: Week 2: SLP Short Term Goal 1 (Week 2): Pt will attend to basic, familiar tasks for ~5 minutes intervals with Mod A verbal cues for redirection.  SLP Short Term Goal 2 (Week 2): Pt will attend to left field of environment during functional tasks  with Mod A verbal cues.  SLP Short Term Goal 3 (Week 2): Pt will consume current diet without overt s/s of aspiration with Min A verbal cues for use of swallowing compensatory strategies.  SLP Short Term Goal 4 (Week 2): Pt will consume trials of nectar thick liquids without overt s/s of aspiration with Mod A verbal cues .  SLP Short Term Goal 5 (Week 2): Pt will utilize speech intelligibility strategies at the pharse level with Max A verbal cues to achieve 75% intelligibility.   Weekly Progress Updates: Pt has made slow functional gains this reporting period and has met 2 out of 5 targeted short term goals.  Pt currently requires max assist during basic tasks due to severe cognitive impairment consistent with right brain dysfunction characterized by decreased attention to tasks, poor awareness of deficits, and left inattention which impact all higher level cognitive processes.  Pt remains on dys 1 textures and honey thick liquids due to moderately severe oropharyngeal dysphagia.  Pt would continue to benefit from skilled ST while inpatient in order to maximize functional independence and reduce burden of care prior to discharge.  Pt and family education is ongoing.       Intensity: Minumum of 1-2 x/day, 30 to 90 minutes Frequency: 3 to 5 out of 7 days Duration/Length of Stay: 3 weeks Treatment/Interventions: Cognitive remediation/compensation;Dysphagia/aspiration precaution training;Internal/external aids;Functional tasks;Patient/family education;Therapeutic Exercise;Therapeutic Activities;Environmental controls   Daily Session  Skilled Therapeutic Interventions: Pt was  seen for skilled ST targeting goals for cognition.  SLP utilized the [employer] to address attention to task, task initiation, and visual scanning to the left of midline.  Pt required max to total assist to locate targets when they were to the left of midline but only min-mod assist for targets on the right.  Pt also benefited from  max assist multimodal cues for task initiation.  Pt was able to sustain his attention to task for ~30 seconds with mod-max assist verbal and visual cues for redirection.  Therapist also facilitated the session with a basic, familiar card game to address the same abovementioned goals.  Pt needed mod assist verbal cues for task organization and sequencing and demonstrated sustained attention to task for 30-45 seconds with mod verbal cues for redirection to task.  Pt was returned to room and left in wheelchair with quick release belt donned and call bell within reach.  Goals updated on this date to reflect current progress and plan of care.       Function:   Eating Eating                 Cognition Comprehension Comprehension assist level: Understands basic 75 - 89% of the time/ requires cueing 10 - 24% of the time  Expression   Expression assist level: Expresses basic 75 - 89% of the time/requires cueing 10 - 24% of the time. Needs helper to occlude trach/needs to repeat words.  Social Interaction Social Interaction assist level: Interacts appropriately 25 - 49% of time - Needs frequent redirection.  Problem Solving Problem solving assist level: Solves basic less than 25% of the time - needs direction nearly all the time or does not effectively solve problems and may need a restraint for safety  Memory Memory assist level: Recognizes or recalls 25 - 49% of the time/requires cueing 50 - 75% of the time   General    Pain Pain Assessment Pain Assessment: No/denies pain   Therapy/Group: Individual Therapy  [name] Cook, Selinda Orion 11/07/2016, 12:49 PM

## 2016-11-07 NOTE — Progress Notes (Signed)
PHYSICAL MEDICINE & REHABILITATION     PROGRESS NOTE    Subjective/Complaints: Pt seen laying in bed this AM.  He states he slept well and "Look, I got my braces on".     ROS: DeniesCP, SOB, nausea, vomiting, diarrhea, but ?reliability.  Objective: Vital Signs: Blood pressure (!) 172/70, pulse 84, temperature 97.5 F (36.4 C), temperature source Oral, resp. rate 18, height 5\' 8"  (1.727 m), weight 85.1 kg (187 lb 11.2 oz), SpO2 95 %. No results found.  Recent Labs  11/06/16 0617  WBC 8.5  HGB 13.6  HCT 40.4  PLT 339    Recent Labs  11/06/16 0617  NA 149*  K 3.4*  CL 114*  GLUCOSE 143*  BUN 29*  CREATININE 1.06  CALCIUM 8.7*   CBG (last 3)   Recent Labs  11/06/16 1644 11/06/16 2138 11/07/16 0651  GLUCAP 132* 136* 141*    Wt Readings from Last 3 Encounters:  11/06/16 85.1 kg (187 lb 11.2 oz)  10/28/16 92 kg (202 lb 12.8 oz)  09/20/16 95.8 kg (211 lb 4 oz)    Physical Exam:  Constitutional: NAD. Vital signs reviewed.   HENT: Normocephalic and atraumatic.  Eyes: EOM are normal. No discharge.  Cardiovascular: RRR, +murmur. No JVD. Respiratory: clear. Unlabored GI: Soft. Bowel sounds are normal. Musculoskeletal: He exhibits no edema. He exhibits no tenderness.  RLE BKA healed Neurological: He is alert.  A&Ox3 with cues Dysarthria Left facial weakness Motor: LUE/LLE: 0/5. mAS 1/4 with finger flexors (unchanged) RUE: 5/5 proximal to distal RLE: HF 5/5   Skin: Skin is warm and dry and intact.  Psychiatric: Flat.   Assessment/Plan: 1. Functional deficits secondary to right basal ganglia infarct which require 3+ hours per day of interdisciplinary therapy in a comprehensive inpatient rehab setting. Physiatrist is providing close team supervision and 24 hour management of active medical problems listed below. Physiatrist and rehab team continue to assess barriers to discharge/monitor patient progress toward functional and medical  goals.  Function:  Bathing Bathing position   Position: Bed  Bathing parts Body parts bathed by patient: Left arm, Abdomen, Chest, Right upper leg, Left upper leg, Front perineal area Body parts bathed by helper: Right arm, Buttocks, Left lower leg, Back  Bathing assist Assist Level: 2 helpers      Upper Body Dressing/Undressing Upper body dressing   What is the patient wearing?: Pull over shirt/dress     Pull over shirt/dress - Perfomed by patient: Thread/unthread right sleeve, Put head through opening Pull over shirt/dress - Perfomed by helper: Thread/unthread right sleeve, Thread/unthread left sleeve, Put head through opening, Pull shirt over trunk        Upper body assist Assist Level: 2 helpers      Lower Body Dressing/Undressing Lower body dressing   What is the patient wearing?: Pants, Socks, Shoes, AFO       Pants- Performed by helper: Thread/unthread right pants leg, Thread/unthread left pants leg, Pull pants up/down       Socks - Performed by helper: Don/doff left sock   Shoes - Performed by helper: Don/doff left shoe, Fasten left   AFO - Performed by helper: Don/doff right AFO      Lower body assist Assist for lower body dressing: 2 Helpers      Toileting Toileting Toileting activity did not occur: No continent bowel/bladder event   Toileting steps completed by helper: Adjust clothing prior to toileting, Performs perineal hygiene, Adjust clothing after toileting Toileting Assistive Devices: Toilet  aid  Toileting assist Assist level: Two helpers   Transfers Chair/bed transfer   Chair/bed transfer method: Lateral scoot Chair/bed transfer assist level: 2 helpers Chair/bed transfer assistive device: Sliding board, Armrests Mechanical lift: Maximove   Locomotion Ambulation Ambulation activity did not occur: Safety/medical concerns         Wheelchair   Type: Manual   Assist Level: Dependent (Pt equals 0%)  Cognition Comprehension  Comprehension assist level: Understands basic 50 - 74% of the time/ requires cueing 25 - 49% of the time  Expression Expression assist level: Expresses basic 50 - 74% of the time/requires cueing 25 - 49% of the time. Needs to repeat parts of sentences.  Social Interaction Social Interaction assist level: Interacts appropriately 25 - 49% of time - Needs frequent redirection.  Problem Solving Problem solving assist level: Solves basic less than 25% of the time - needs direction nearly all the time or does not effectively solve problems and may need a restraint for safety  Memory Memory assist level: Recognizes or recalls 25 - 49% of the time/requires cueing 50 - 75% of the time   Medical Problem List and Plan: 1.  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  DVT Prophylaxis/Anticoagulation: Pharmaceutical: Lovenox 3. Pain Management: tylenol prn. Continue to educate patient family on LUE positioning.  4. Mood: LCSW to follow for evaluation and support.  5. Neuropsych: This patient is not fully capable of making decisions on his own behalf. 6. Skin/Wound Care: routine pressure relief measures 7. Fluids/Electrolytes/Nutrition: Monitor I/O.   D1 Honey  Monitor hydration on honey liquids- concerns of adequate intake with ongoing hiccups and coughing due to difficulty with secretions.  8. HTN: Monitor BP. Goal of BP 130-150--avoid hypotension  Lisinopril 2.5 started 12/11, increased to 7.5 on 12/12, increased to 10 on 12/13  Norvasc 5mg  started 12/12, increased to 10 on 12/14  Labile at present Vitals:   11/06/16 1731 11/07/16 0512  BP: 116/78 (!) 172/70  Pulse:  84  Resp:  18  Temp:  97.5 F (36.4 C)   9. 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  11. Hiccups:   Baclofen increased to 10mg  QID monitor for  sedation, ?resolved, weaned to 5mg  on 12/13, will consider further decrease tomorrow. 12. GERD: On Zantac bid. 13. Pre-diabtes: Hgb A1c- 5.9. CBG (last 3)   Recent Labs  11/06/16 1644 11/06/16 2138 11/07/16 0651  GLUCAP 132* 136* 141*   Relatively controlled 12/13  Continue to monitor 14. Mild hyponatremia  K+ 3.4 on 12/13  Will cont to monitor  Labs ordered for tomorrow 15. Elevated BUN/Cr  Started IVF at night  Labs ordered for tomorrow 16. Hypernatremia: Resolved  Na+ 149 on 12/13  Cont to monitor  Labs ordered for tomorrow    LOS (Days) 8 A FACE TO FACE EVALUATION WAS PERFORMED  Ankit Lorie Phenix 11/07/2016 10:09 AM

## 2016-11-07 NOTE — Progress Notes (Signed)
Social Work Patient ID: [name] Cook, male   DOB: Apr 02, 1941, 75 y.o.   MRN: [id number]  Met with pt and wife to discuss team conference goals of mod assist wheelchair level and target discharge 12/27. Discussed the amount of care pt will require and the difficulty in providing this 24 hr per day. Wife reports they want him home and have a male friend who can help. Do not feel wife truly understands The amount of car ept will be at home. Will try to contact son and talk with him regarding this. Pt listened but fell asleep during the meeting. Wife also wants list of his goals on paper will give to her if this helps her  Understand his levels. Continue to work on a safe/realistic discharge plan.

## 2016-11-07 NOTE — Progress Notes (Signed)
Physical Therapy Weekly Progress Note  Patient Details  Name: [name] Cook MRN: [id number] Date of Birth: Feb 20, 1941  Beginning of progress report period: October 31, 2016 End of progress report period: November 07, 2016  Today's Date: 11/07/2016 PT Individual Time: 4268-3419 and 1345-1415 PT Individual Time Calculation (min): 45 min and 30 min   Patient has met 1 of 4 short term goals. Patient has made minimal functional progress since evaluation and continues to require max to total assist of 2 persons for all mobility tasks. Patient demonstrates significantly impaired attention, L neglect, and poor awareness of deficits impacting his ability to actively participate in therapies with max-total multimodal A cues. Wife frequently present to observe therapies but appears unrealistic about level of care patient will require at discharge. Continue plan of care.   Patient continues to demonstrate the following deficits muscle weakness, muscle joint tightness and muscle paralysis, decreased cardiorespiratoy endurance, impaired timing and sequencing, abnormal tone, unbalanced muscle activation and decreased motor planning, decreased midline orientation and left side neglect, decreased initiation, decreased attention, decreased awareness, decreased problem solving, decreased safety awareness, decreased memory and delayed processing and decreased sitting balance, decreased standing balance, decreased postural control, hemiplegia and decreased balance strategies and therefore will continue to benefit from skilled PT intervention to increase functional independence with mobility.  Patient not progressing toward long term goals.  Will consider downgrading goals next week.  Continue plan of care.  PT Short Term Goals Week 1:  PT Short Term Goal 1 (Week 1): Patient will perform bed mobility with max A.  PT Short Term Goal 1 - Progress (Week 1): Met PT Short Term Goal 2 (Week 1): Patient will perform  transfers with assist of 1 person.  PT Short Term Goal 2 - Progress (Week 1): Not met PT Short Term Goal 3 (Week 1): Patient will maintain static sitting balance with min A x 5 min.  PT Short Term Goal 3 - Progress (Week 1): Not met PT Short Term Goal 4 (Week 1): Patient will initiate wheelchair propulsion.  PT Short Term Goal 4 - Progress (Week 1): Not met Week 2:  PT Short Term Goal 1 (Week 2): Patient will roll in bed with mod A to L and max A to R. PT Short Term Goal 2 (Week 2): Patient will perform transfers with mod A x 2 persons. PT Short Term Goal 3 (Week 2): Patient will maintain static sitting balance with mod A x 1 min. PT Short Term Goal 4 (Week 2): Patient will sustain attention to functional task x 30 sec with mod cues.  Skilled Therapeutic Interventions/Progress Updates:    Treatment 1: Patient in bed upon arrival with R prosthesis still donned (R shoe removed only) from last night. Sign posted above bed to alert nursing staff to doff prosthesis nightly and to don resting hand splint and PRAFO for LUE/LLE. Due to time constraints, requested OT perform skin check at time of bathing and dressing. Patient required max multimodal cues for rolling to R and L with max-total A for LB dressing and total A to don shoes. Transitioned sit > supine EOB with use of rail and HOB slightly raised with +2A. Patient required max-total A for static balance EOB. Performed slide board transfer to R to wheelchair with total A x 2 and patient demonstrating pushing to L requiring total A hand over hand assist to remove RUE from arm rest and place in lap. Patient consumed breakfast seated in TIS wheelchair with max multimodal cues  for sustained attention to task, attending to midline and L, and adhering to swallowing precautions. Retrieved different TIS wheelchair for patient for improved postural control and sitting tolerance but no wheelchair cushion available in correct size at this time. Patient left sitting  in wheelchair with quick release belt on and needs in reach.   Treatment 2: Patient in wheelchair upon arrival, transported to day room for patient holiday party with wife [name] present. Session focused on attention to task, midline reorientation, and attention to L while making Christmas cards from wheelchair level with min-mod cues overall. Patient left in [place] wheelchair to return to room with wife.   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  Therapy/Group: Individual Therapy  Pura Picinich, Murray Hodgkins 11/07/2016, 9:17 AM

## 2016-11-07 NOTE — Progress Notes (Signed)
Social Work [name], LCSW Social Worker Signed   Patient Care Conference Date of Service: 11/07/2016  9:08 AM      Hide copied text Hover for attribution information Inpatient RehabilitationTeam Conference and Plan of Care Update Date: 11/06/2016   Time: 2:20 PM      Patient Name: Kenyada Homeier      Medical Record Number: [id number]  Date of Birth: 1941-10-19 Sex: Male         Room/Bed: 4W22C/4W22C-01 Payor Info: Payor: MEDICARE / Plan: MEDICARE PART A AND B / Product Type: *No Product type* /     Admitting Diagnosis: R CVA  Admit Date/Time:  10/30/2016  6:32 PM Admission Comments: No comment available    Primary Diagnosis:  <principal problem not specified> Principal Problem: <principal problem not specified>       Patient Active Problem List    Diagnosis Date Noted  . Hypernatremia    . Hypertensive urgency    . 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 Occlusion and stenosis of carotid artery    . Right basal ganglia embolic stroke ([place]) [id number]  . Cerebral infarction due to stenosis of right carotid artery ([place])    . Gastroesophageal reflux disease without esophagitis    . S/P BKA (below knee amputation) unilateral, right ([place])    . Benign essential HTN    . Hiccups    . Flaccid hemiplegia of left nondominant side due to infarction of brain ([place])    . Cerebral infarction ([place])    . Hypertensive crisis    . Acute lower UTI    . Stenosis of carotid artery    . Prediabetes    . CVA (cerebral vascular accident) ([place]) 10/28/2016  . Bilateral carotid artery stenosis    . Left-sided weakness 10/27/2016  . Ischemic stroke ([place]) 10/27/2016  . Acute ischemic stroke ([place]) 10/27/2016  . NSTEMI (non-ST elevated myocardial infarction) ([place]) 10/12/2014  . Hx of right BKA ([place]) 10/12/2014  . 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 Atherosclerosis of native arteries of extremity with  intermittent claudication ([place]) 12/08/2013  . Diabetes mellitus with circulatory complication ([place])    . Essential hypertension, benign 07/17/2013  . Hypokalemia 07/17/2013  . Non-ST elevation myocardial infarction (NSTEMI), initial episode of care ([place]) 07/16/2013  . GERD (gastroesophageal reflux disease) 03/15/2013  . Peripheral vascular disease ([place]) 03/03/2013  . CAROTID BRUIT 02/06/2011  . Dyslipidemia 10/31/2009  . Essential hypertension 10/31/2009  . Coronary atherosclerosis 10/31/2009      Expected Discharge Date: Expected Discharge Date: 11/20/16   Team Members Present: Physician leading conference: Dr. Delice Lesch Social Worker Present: Ovidio Kin, LCSW Nurse Present: Dorthula Nettles, RN PT Present: Carney Living, PT OT Present: Other (comment) Grayland Ormond Doe-OT) SLP Present: Windell Moulding, SLP PPS Coordinator present : Daiva Nakayama, RN, CRRN       Current Status/Progress Goal Weekly Team Focus  Medical   Left hemiplegia, dysarthria, dysphagia secondary to right basal ganglia infarct with history of right BKA  Improve awareness, attention HTN, electrolyte abnormalities  See above   Bowel/Bladder   incont of B/B. LBM 11/04/16  time toileting with mod assist  monitor for changes in B/B status   Swallow/Nutrition/ Hydration   Dys 1, honey thick liquids   min assist with least restrictive diet   trials of advanced textures per improved attention to boluses   ADL's   Max A-Total A+2  Overall Mod A  attention, awareness, NMR, pt/family ed, modified bathing/dressing sitting balance, functional transfers   Mobility   max to total +2  min-mod A overall w/c level  functional transfers, L attention, L NMR, sitting balance, standing tolerance in standing frame, attention, awareness, pt/fam education   Communication   dysarthric   mod assist   education and carryover of compensatory strategies per improved mentation    Safety/Cognition/ Behavioral Observations max assist   mod assist    basic cognition, visual scanning to the left of midline    Pain   pt denies pain  <2  manage pain with pharmacological and non-pharmacological measures   Skin   No current skin issues; Hx R BKA  pt will maintain skin integrity  monitor for signs of skin breakdown       *See Care Plan and progress notes for long and short-term goals.   Barriers to Discharge: Mobility, transfers, safety, awareness, electrolyte abnormalities, hypertensive urgency   Possible Resolutions to Barriers:  Therapies, follow labs, optimize BP meds   Discharge Planning/Teaching Needs:  Family wants to take him home but will need a people who can provide physical care due to wife is not able too. Will work on a realistic discharge plan.      Team Discussion:  Patient has severe deficits-currently total assist plus 2 with sitting balance and trunk control. Goals mod-assist wheelchair level. No awareness or attention to his right side. Dys 2 honey thick liquids, MD starting IV fluids. Incont B & B. Would require much care by wife and family question if they can provide this level of care.  Revisions to Treatment Plan:  Home versus NHP    Continued Need for Acute Rehabilitation Level of Care: The patient requires daily medical management by a physician with specialized training in physical medicine and rehabilitation for the following conditions: Daily direction of a multidisciplinary physical rehabilitation program to ensure safe treatment while eliciting the highest outcome that is of practical value to the patient.: Yes Daily medical management of patient stability for increased activity during participation in an intensive rehabilitation regime.: Yes Daily analysis of laboratory values and/or radiology reports with any subsequent need for medication adjustment of medical intervention for : Post surgical problems;Neurological problems;Blood pressure problems   [name] 11/07/2016, 9:08 AM       Patient  ID: Lamont Dowdy, male   DOB: 11-23-1941, 75 y.o.   MRN: [id number]

## 2016-11-07 NOTE — Patient Care Conference (Signed)
Inpatient RehabilitationTeam Conference and Plan of Care Update Date: 11/06/2016   Time: 2:20 PM    Patient Name: [name] Cook      Medical Record Number: [id number]  Date of Birth: 1940/12/05 Sex: Male         Room/Bed: 4W22C/4W22C-01 Payor Info: Payor: MEDICARE / Plan: MEDICARE PART A AND B / Product Type: *No Product type* /    Admitting Diagnosis: R CVA  Admit Date/Time:  10/30/2016  6:32 PM Admission Comments: No comment available   Primary Diagnosis:  <principal problem not specified> Principal Problem: <principal problem not specified>  Patient Active Problem List   Diagnosis Date Noted  . Hypernatremia   . Hypertensive urgency   . 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 Occlusion and stenosis of carotid artery   . Right basal ganglia embolic stroke ([place]) [id number]  . 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 Gastroesophageal reflux disease without esophagitis   . S/P BKA (below knee amputation) unilateral, right ([place])   . Benign essential HTN   . Hiccups   . 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 Cerebral infarction ([place])   . Hypertensive crisis   . Acute lower UTI   . Stenosis of carotid artery   . Prediabetes   . CVA (cerebral vascular accident) ([place]) 10/28/2016  . Bilateral carotid artery stenosis   . Left-sided weakness 10/27/2016  . Ischemic stroke ([place]) 10/27/2016  . Acute ischemic stroke ([place]) 10/27/2016  . NSTEMI (non-ST elevated myocardial infarction) ([place]) 10/12/2014  . Hx of right BKA ([place]) 10/12/2014  . 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 Atherosclerosis of native arteries of extremity with intermittent claudication ([place]) 12/08/2013  . Diabetes mellitus with circulatory complication ([place])   . Essential hypertension, benign 07/17/2013  . Hypokalemia 07/17/2013  . Non-ST elevation myocardial infarction  (NSTEMI), initial episode of care ([place]) 07/16/2013  . GERD (gastroesophageal reflux disease) 03/15/2013  . Peripheral vascular disease ([place]) 03/03/2013  . CAROTID BRUIT 02/06/2011  . Dyslipidemia 10/31/2009  . Essential hypertension 10/31/2009  . Coronary atherosclerosis 10/31/2009    Expected Discharge Date: Expected Discharge Date: 11/20/16  Team Members Present: Physician leading conference: Dr. Delice Lesch Social Worker Present: Ovidio Kin, LCSW Nurse Present: Dorthula Nettles, RN PT Present: Carney Living, PT OT Present: Other (comment) Grayland Ormond Doe-OT) SLP Present: Windell Moulding, SLP PPS Coordinator present : Daiva Nakayama, RN, CRRN     Current Status/Progress Goal Weekly Team Focus  Medical   Left hemiplegia, dysarthria, dysphagia secondary to right basal ganglia infarct with history of right BKA  Improve awareness, attention HTN, electrolyte abnormalities  See above   Bowel/Bladder   incont of B/B. LBM 11/04/16  time toileting with mod assist  monitor for changes in B/B status   Swallow/Nutrition/ Hydration   Dys 1, honey thick liquids   min assist with least restrictive diet   trials of advanced textures per improved attention to boluses   ADL's   Max A-Total A+2  Overall Mod A  attention, awareness, NMR, pt/family ed, modified bathing/dressing sitting balance, functional transfers   Mobility   max to total +2  min-mod A overall w/c level  functional transfers, L attention, L NMR, sitting balance, standing tolerance in standing frame, attention, awareness, pt/fam education   Communication   dysarthric   mod assist   education and carryover of compensatory  strategies per improved mentation    Safety/Cognition/ Behavioral Observations  max assist   mod assist   basic cognition, visual scanning to the left of midline    Pain   pt denies pain  <2  manage pain with pharmacological and non-pharmacological measures   Skin   No current skin issues; Hx R BKA  pt will  maintain skin integrity  monitor for signs of skin breakdown      *See Care Plan and progress notes for long and short-term goals.  Barriers to Discharge: Mobility, transfers, safety, awareness, electrolyte abnormalities, hypertensive urgency    Possible Resolutions to Barriers:  Therapies, follow labs, optimize BP meds    Discharge Planning/Teaching Needs:  Family wants to take him home but will need a people who can provide physical care due to wife is not able too. Will work on a realistic discharge plan.      Team Discussion:  Patient has severe deficits-currently total assist plus 2 with sitting balance and trunk control. Goals mod-assist wheelchair level. No awareness or attention to his right side. Dys 2 honey thick liquids, MD starting IV fluids. Incont B & B. Would require much care by wife and family question if they can provide this level of care.  Revisions to Treatment Plan:  Home versus NHP   Continued Need for Acute Rehabilitation Level of Care: The patient requires daily medical management by a physician with specialized training in physical medicine and rehabilitation for the following conditions: Daily direction of a multidisciplinary physical rehabilitation program to ensure safe treatment while eliciting the highest outcome that is of practical value to the patient.: Yes Daily medical management of patient stability for increased activity during participation in an intensive rehabilitation regime.: Yes Daily analysis of laboratory values and/or radiology reports with any subsequent need for medication adjustment of medical intervention for : Post surgical problems;Neurological problems;Blood pressure problems  [name] 11/07/2016, 9:08 AM

## 2016-11-07 NOTE — Progress Notes (Signed)
Occupational Therapy Session Note  Patient Details  Name: [name] Cook MRN: [id number] Date of Birth: 08/01/1941  Today's Date: 11/07/2016 OT Individual Time: 1100-1200 OT Individual Time Calculation (min): 60 min    Short Term Goals: Week 1:  OT Short Term Goal 1 (Week 1): Pt will maintan sitting balance with min A during simple ADL task OT Short Term Goal 2 (Week 1): Pt will transfer with mod A +2 with LRAD bed/ chair in prep for ADL task OT Short Term Goal 3 (Week 1): Pt will demonstrate safe management of left UE with bed mobility with mod cuing  OT Short Term Goal 4 (Week 1): Pt will roll for LB clothing management with mod A with bed rails   Skilled Therapeutic Interventions/Progress Updates:    Pt greeted in w/c with spouse and son present. OT removed R LE prosthesis to check skin as prosthesis was left on overnight last nights. PT placed signs above bed to remind night nursing staff. Pt's residual limb with redness noted. Added pink dressing to pressure area- prosthesis removed between therapy sessions. UB bathing/dressing completed at the sink with focus on sitting balance, attention, awareness, and hemi-techniques. Pt required constant re-direction and max multimodal cues to attend to simple ADL task of doffing and donning shirt today. Attempted use of Stedy for sit<>stand, pt unable to safely achieve stand. Pt pushed stedy away instead of pull to stand. Had pt practice pulling up from sitting to encourage motor planning of movement prior to second attempt to stand. 2nd attempt also unsuccessful. Pt then brought to therapy gym and worked on sitting balance, attention to L,and visual scanning. Pt returned to room at end of session and left with needs met, R prosthesis removed, and spouse present. 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   Therapy/Group: Individual Therapy  Valma Cava 11/07/2016, 12:48 PM

## 2016-11-07 NOTE — Plan of Care (Signed)
Problem: RH SAFETY Goal: RH STG DEMO UNDERSTANDING HOME SAFETY PRECAUTIONS Patient and caregiver able to demonstrate understanding of home safety precautions with minimal assistance.  Outcome: Progressing Wife at bedside  Problem: Consults Goal: Nutrition Consult-if indicated Outcome: Not Progressing Refuses pro stat

## 2016-11-07 NOTE — Plan of Care (Signed)
Problem: RH BLADDER ELIMINATION Goal: RH STG MANAGE BLADDER WITH ASSISTANCE STG Manage Bladder With moderate Assistance    Outcome: Progressing Cond. Cath in place.   Problem: RH SKIN INTEGRITY Goal: RH STG SKIN FREE OF INFECTION/BREAKDOWN Moderate assistance  Outcome: Progressing Pt will be turned 2qhr.  Problem: RH SAFETY Goal: RH STG ADHERE TO SAFETY PRECAUTIONS W/ASSISTANCE/DEVICE STG Adhere to Safety Precautions With minimal Assistance/Device.   Outcome: Progressing Bed locked, bed alarm on, and call bell in reach. Goal: RH STG DECREASED RISK OF FALL WITH ASSISTANCE STG Decreased Risk of Fall With minimal Assistance.   Outcome: Progressing Toileting needs addressed.

## 2016-11-08 ENCOUNTER — Inpatient Hospital Stay (HOSPITAL_COMMUNITY): Payer: Medicare Other | Admitting: Occupational Therapy

## 2016-11-08 ENCOUNTER — Inpatient Hospital Stay (HOSPITAL_COMMUNITY): Payer: Medicare Other | Admitting: Speech Pathology

## 2016-11-08 ENCOUNTER — Inpatient Hospital Stay (HOSPITAL_COMMUNITY): Payer: Medicare Other | Admitting: Physical Therapy

## 2016-11-08 ENCOUNTER — Inpatient Hospital Stay (HOSPITAL_COMMUNITY): Payer: Medicare Other | Admitting: *Deleted

## 2016-11-08 DIAGNOSIS — N179 Acute kidney failure, unspecified: Secondary | ICD-10-CM

## 2016-11-08 LAB — BASIC METABOLIC PANEL
ANION GAP: 6 (ref 5–15)
BUN: 34 mg/dL — ABNORMAL HIGH (ref 6–20)
CALCIUM: 8.4 mg/dL — AB (ref 8.9–10.3)
CO2: 22 mmol/L (ref 22–32)
Chloride: 116 mmol/L — ABNORMAL HIGH (ref 101–111)
Creatinine, Ser: 1.28 mg/dL — ABNORMAL HIGH (ref 0.61–1.24)
GFR calc Af Amer: >60 mL/min (ref >60)
GFR calc non Af Amer: 53 mL/min — ABNORMAL LOW (ref >60)
Glucose, Bld: 143 mg/dL — ABNORMAL HIGH (ref 65–99)
Potassium: 3.6 mmol/L (ref 3.5–5.1)
Sodium: 144 mmol/L (ref 135–145)

## 2016-11-08 LAB — GLUCOSE, CAPILLARY
GLUCOSE-CAPILLARY: 118 mg/dL — AB (ref 65–99)
GLUCOSE-CAPILLARY: 132 mg/dL — AB (ref 65–99)
Glucose-Capillary: 136 mg/dL — ABNORMAL HIGH (ref 65–99)
Glucose-Capillary: 160 mg/dL — ABNORMAL HIGH (ref 65–99)

## 2016-11-08 MED ORDER — SENNOSIDES-DOCUSATE SODIUM 8.6-50 MG PO TABS
2.0000 | ORAL_TABLET | Freq: Two times a day (BID) | ORAL | Status: DC
  Administered 2016-11-08 – 2016-11-18 (×22): 2 via ORAL
  Filled 2016-11-08 (×22): qty 2

## 2016-11-08 MED ORDER — HYDRALAZINE HCL 25 MG PO TABS
25.0000 mg | ORAL_TABLET | Freq: Three times a day (TID) | ORAL | Status: DC
  Administered 2016-11-09 – 2016-11-11 (×7): 25 mg via ORAL
  Filled 2016-11-08 (×7): qty 1

## 2016-11-08 MED ORDER — LIDOCAINE HCL 2 % EX GEL: CUTANEOUS | Status: DC | PRN

## 2016-11-08 MED ORDER — FLEET ENEMA 7-19 GM/118ML RE ENEM
1.0000 | ENEMA | Freq: Once | RECTAL | Status: AC
  Administered 2016-11-08: 1 via RECTAL
  Filled 2016-11-08: qty 1

## 2016-11-08 MED ORDER — HYDROCORTISONE ACETATE 25 MG RE SUPP
25.0000 mg | Freq: Two times a day (BID) | RECTAL | Status: DC
  Administered 2016-11-09 – 2016-11-12 (×6): 25 mg via RECTAL
  Filled 2016-11-08 (×7): qty 1

## 2016-11-08 NOTE — Progress Notes (Signed)
Physical Therapy Session Note  Patient Details  Name: [name] Cook MRN: [id number] Date of Birth: 06-Mar-1941  Today's Date: 11/08/2016 PT Individual Time: 1400-1500 PT Individual Time Calculation (min): 60 min    Short Term Goals:Week 2:  PT Short Term Goal 1 (Week 2): Patient will roll in bed with mod A to L and max A to R. PT Short Term Goal 2 (Week 2): Patient will perform transfers with mod A x 2 persons. PT Short Term Goal 3 (Week 2): Patient will maintain static sitting balance with mod A x 1 min. PT Short Term Goal 4 (Week 2): Patient will sustain attention to functional task x 30 sec with mod cues.  Skilled Therapeutic Interventions/Progress Updates:  Pt up in [place] with wife, [name], present. He has no complaints, but is thirsty, so performed functional tasks towards drinking honey-thick water for PT. Staff pushed pt for time management.   Pt stood in standing frame for ~10 minutes with PT behind him to manually facilitate midline orientation and trunk extension. Pt redirected as needed to reduce pushing with RUE toward L. Pt performed multiple tasks for functional cognitive remediation including counting scoops of thickener and stirring. Pt challenged to performed initiation for drinking his water sips and needed max cues for two swallows with visual demonstration.   Pt performed constant NMR tasks throughout related to initiation, sequence, sustained attention, and midline orientation during stading frame.   Sliding board transfer with +2 A for safety and equipment to pt's L. Pt able to assist ~10%.  Static and dynamic sitting balance performed EOB x10 min with assist varying from S to Max A. Pt performance best when hands in lap or following period of resting on R elbow. Performed multiple reaching and rotating tasks to achieve trunk elongation and attention to L.   Sit>supine with Mod A. Pt able to assist boost to [facility] when PT holds LEs in hooklying.  Educated wife on  positioning self on pt's L for stimulation.     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   Therapy/Group: Individual Therapy  Dajia Gunnels Soundra Pilon, PT, DPT  11/08/2016, 12:37 PM

## 2016-11-08 NOTE — Progress Notes (Signed)
Physical Therapy Note  Patient Details  Name: Rejino Josephsen MRN: [id number] Date of Birth: 11/06/1941 Today's Date: 11/08/2016    Time: 930-957 27 minutes  1:1 No c/o pain.  PROM to Lt LE knee, hip and ankle with pt with significantly limited hamstring length, good tolerance to PROM.  PNF diagonals Lt LE with pt with improving hip and knee extensor strength, requires increased time but able to activate. Pt with little hip flexor or knee extensor activation during PNFs.  Attempted to activate Lt UE with tapping and quick stretch, unable to elicit contraction of biceps or shoulder IR/ER.   Abdulai Blaylock 11/08/2016, 9:57 AM

## 2016-11-08 NOTE — Progress Notes (Signed)
Social Work Patient ID: [name] Cook, male   DOB: 10-10-1941, 75 y.o.   MRN: [id number]  Spoke with [name] Cook and wife to discuss again team conference goals-mod assist level and target discharge 12/27. Discussed the amount of care pt will require at home and if this is a realistic option to go home from rehab. Son feels they need to have him go to a NH until he is better and his care more manageable. They will go and tour some of the NH facilities close to their home and get back with this worker. Continue to work on discharge plans.

## 2016-11-08 NOTE — Progress Notes (Signed)
Occupational Therapy Weekly Progress Note  Patient Details  Name: [name] Cook MRN: [id number] Date of Birth: Dec 25, 1940  Beginning of progress report period: October 31, 2016 End of progress report period: November 08, 2016  Today's Date: 11/08/2016 OT Individual Time: 1100-1200 OT Individual Time Calculation (min): 60 min   Patient has met 0 of 4 short term goals.  Pt is making slow progress towards OT goals at this time 2/2 attention and awareness deficits that greatly impact his ability to participate in daily self-care tasks. Pt has demonstrated some improvement however, and is able to participate in bathing/dressing tasks with Max multimodal cues for re-direction.  Will continue plan of care and work towards progression of OT goals.   Patient continues to demonstrate the following deficits: muscle weakness, impaired timing and sequencing, abnormal tone, unbalanced muscle activation, decreased coordination and decreased motor planning, decreased visual perceptual skills, decreased midline orientation and decreased attention to left, decreased initiation, decreased attention, decreased awareness, decreased problem solving, decreased safety awareness, decreased memory and delayed processing,  and decreased sitting balance, decreased standing balance, decreased postural control, hemiplegia and decreased balance strategies and therefore will continue to benefit from skilled OT intervention to enhance overall performance with BADL and Reduce care partner burden.  Patient progressing toward long term goals..  Continue plan of care.  OT Short Term Goals Week 1:  OT Short Term Goal 1 (Week 1): Pt will maintan sitting balance with min A during simple ADL task OT Short Term Goal 1 - Progress (Week 1): Progressing toward goal OT Short Term Goal 2 (Week 1): Pt will transfer with mod A +2 with LRAD bed/ chair in prep for ADL task OT Short Term Goal 2 - Progress (Week 1): Progressing toward goal OT  Short Term Goal 3 (Week 1): Pt will demonstrate safe management of left UE with bed mobility with mod cuing  OT Short Term Goal 3 - Progress (Week 1): Progressing toward goal OT Short Term Goal 4 (Week 1): Pt will roll for LB clothing management with mod A with bed rails  OT Short Term Goal 4 - Progress (Week 1): Progressing toward goal Week 2:  OT Short Term Goal 1 (Week 2): Pt will maintan sitting balance with min A during simple ADL task OT Short Term Goal 2 (Week 2): Pt will transfer with mod A +2 with LRAD bed/ chair in prep for ADL task OT Short Term Goal 3 (Week 2): Pt will demonstrate safe management of left UE with bed mobility with mod cuing  OT Short Term Goal 4 (Week 2): Pt will roll for LB clothing management with mod A with bed rails    Skilled Therapeutic Interventions/Progress Updates:    OT session focused on attention, awareness, sequencing, modified bathing/dressing, and functional transfers.Pt greeted in bed with spouse present, R prosthesis was removed last night. Noted slight improved attention to L today. Bed level bathing and LB dressing completed. Max multimodal cues to roll w/ Max A +2 to L and total A +2 to R. Pt able to wash chest and L shoulder consecutively with mod Vc. Pt aware of need to urinate, but unable to warn therapist in time to get urinal. Total A LB bathing/dressing (R prosthesis donned in bed). Max+ 2 sup<>sit. Pt able to maintain sitting balance with R UE holding onto bed rail w/ Min A, progressing to Max A without UE support. Max multimodal cues to maintain attention to UB dressing task seated EOB. Pt able to verbalize where  sliding should be placed and initiated scoot to L today with OT facilitating weight shifting and L support for balance. Max cues for hand-placement and technique. Pt has tendency to push himself away when grasping for w/c rail during transfer, requiring hand-over-hand assist. Max-Total A sliding board transder >w/c. Pt requested to shave, so  brought to the sink for task. With increased time and Mod-Max questioning cues, pt able to shave using mirror feedback in minimally distracting environment. Max cues to attend to L side of face, requiring OT assist for thoroughness. Pt left seated in TIS w/c at end of session with spouse present and safety belt on.   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   Therapy/Group: Individual Therapy  Valma Cava 11/08/2016, 8:30 AM

## 2016-11-08 NOTE — Progress Notes (Signed)
[facility] PHYSICAL MEDICINE & REHABILITATION     PROGRESS NOTE    Subjective/Complaints: Pt seen laying in bed this AM.  He slept well overnight.  He had his braces in place.  He denies any complaints.      ROS: Denies CP, SOB, nausea, vomiting, diarrhea, but ?reliability.  Objective: Vital Signs: Blood pressure (!) 143/65, pulse 94, temperature 97.6 F (36.4 C), temperature source Oral, resp. rate 18, height 5\' 8"  (1.727 m), weight 85.1 kg (187 lb 11.2 oz), SpO2 96 %. No results found.  Recent Labs  11/06/16 0617  WBC 8.5  HGB 13.6  HCT 40.4  PLT 339    Recent Labs  11/06/16 0617 11/08/16 0448  NA 149* 144  K 3.4* 3.6  CL 114* 116*  GLUCOSE 143* 143*  BUN 29* 34*  CREATININE 1.06 1.28*  CALCIUM 8.7* 8.4*   CBG (last 3)   Recent Labs  11/07/16 2025 11/08/16 0646 11/08/16 1155  GLUCAP 176* 136* 160*    Wt Readings from Last 3 Encounters:  11/06/16 85.1 kg (187 lb 11.2 oz)  10/28/16 92 kg (202 lb 12.8 oz)  09/20/16 95.8 kg (211 lb 4 oz)    Physical Exam:  Constitutional: NAD. Vital signs reviewed.   HENT: Normocephalic and atraumatic.  Eyes: EOM are normal. No discharge.  Cardiovascular: RRR, +murmur. No JVD. Respiratory: clear. Unlabored GI: Soft. Bowel sounds are normal. Musculoskeletal: He exhibits no edema. He exhibits no tenderness.  RLE BKA healed Neurological: He is alert.  Alert Dysarthria Left facial weakness Motor: LUE/LLE: 0/5. mAS 1/4 with finger flexors (unchanged) RUE: 5/5 proximal to distal RLE: HF 5/5   Skin: Skin is warm and dry and intact.  Psychiatric: Flat.   Assessment/Plan: 1. Functional deficits secondary to right basal ganglia infarct which require 3+ hours per day of interdisciplinary therapy in a comprehensive inpatient rehab setting. Physiatrist is providing close team supervision and 24 hour management of active medical problems listed below. Physiatrist and rehab team continue to assess barriers to  discharge/monitor patient progress toward functional and medical goals.  Function:  Bathing Bathing position   Position: Bed  Bathing parts Body parts bathed by patient: Left arm, Chest, Abdomen, Front perineal area, Right upper leg, Left upper leg Body parts bathed by helper: Left lower leg, Buttocks, Right arm  Bathing assist Assist Level: 2 helpers      Upper Body Dressing/Undressing Upper body dressing   What is the patient wearing?: Pull over shirt/dress     Pull over shirt/dress - Perfomed by patient: Put head through opening, Thread/unthread right sleeve Pull over shirt/dress - Perfomed by helper: Pull shirt over trunk, Thread/unthread left sleeve        Upper body assist Assist Level: 2 helpers      Lower Body Dressing/Undressing Lower body dressing   What is the patient wearing?: Pants, AFO, Socks, Shoes       Pants- Performed by helper: Thread/unthread right pants leg, Thread/unthread left pants leg, Pull pants up/down       Socks - Performed by helper: Don/doff right sock, Don/doff left sock   Shoes - Performed by helper: Don/doff right shoe, Don/doff left shoe   AFO - Performed by helper: Don/doff right AFO, Don/doff left AFO      Lower body assist Assist for lower body dressing: 2 Helpers      Toileting Toileting Toileting activity did not occur: No continent bowel/bladder event   Toileting steps completed by helper: Adjust clothing prior to  toileting, Performs perineal hygiene, Adjust clothing after toileting Toileting Assistive Devices: Toilet aid  Toileting assist Assist level: Two helpers   Transfers Chair/bed transfer   Chair/bed transfer method: Lateral scoot Chair/bed transfer assist level: 2 helpers Chair/bed transfer assistive device: Mechanical lift Mechanical lift: Maximove   Locomotion Ambulation Ambulation activity did not occur: Safety/medical concerns         Wheelchair   Type: Manual   Assist Level: Dependent (Pt equals  0%)  Cognition Comprehension Comprehension assist level: Understands basic 90% of the time/cues < 10% of the time  Expression Expression assist level: Expresses basic 90% of the time/requires cueing < 10% of the time.  Social Interaction Social Interaction assist level: Interacts appropriately 25 - 49% of time - Needs frequent redirection.  Problem Solving Problem solving assist level: Solves basic 25 - 49% of the time - needs direction more than half the time to initiate, plan or complete simple activities  Memory Memory assist level: Recognizes or recalls 25 - 49% of the time/requires cueing 50 - 75% of the time   Medical Problem List and Plan: 1.  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  DVT Prophylaxis/Anticoagulation: Pharmaceutical: Lovenox 3. Pain Management: tylenol prn. Continue to educate patient family on LUE positioning.  4. Mood: LCSW to follow for evaluation and support.  5. Neuropsych: This patient is not fully capable of making decisions on his own behalf. 6. Skin/Wound Care: routine pressure relief measures 7. Fluids/Electrolytes/Nutrition: Monitor I/O.   D1 Honey  Monitor hydration on honey liquids- concerns of adequate intake with ongoing hiccups and coughing due to difficulty with secretions.  8. HTN: Monitor BP. Goal of BP 130-150--avoid hypotension  Lisinopril 2.5 started 12/11, increased to 7.5 on 12/12, increased to 10 on 12/13, d/ced on 12/16.    Norvasc 5mg  started 12/12, increased to 10 on 12/16  Hydralazine 25 TID started 12/16  Improving, will cont to monitor Vitals:   11/08/16 0438 11/08/16 1300  BP: (!) 155/61 (!) 143/65  Pulse: 82 94  Resp: 18 18  Temp: 97.8 F (36.6 C) 97.6 F (36.4 C)   9. 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  11. Hiccups:   Baclofen  increased to 10mg  QID monitor for sedation, ?resolved, weaned to 5mg  on 12/13, will consider further decrease next week. 12. GERD: On Zantac bid. 13. Pre-diabtes: Hgb A1c- 5.9. CBG (last 3)   Recent Labs  11/07/16 2025 11/08/16 0646 11/08/16 1155  GLUCAP 176* 136* 160*   Appears to be trending up, will cont to monitor 14. Mild hyponatremia  K+ 3.6 on 12/15 after supplementation  Will cont to monitor 15. AKI  Dehydration +/- ACE  Started IVF at night, ACE d/ced  Cont to to trend up 12/15 16. Hypernatremia: Resolved  Na+ 144 on 12/15  Cont to monitor   LOS (Days) 9 A FACE TO FACE EVALUATION WAS PERFORMED  Hayden Kihara Lorie Phenix 11/08/2016 2:20 PM

## 2016-11-08 NOTE — Progress Notes (Signed)
Speech Language Pathology Daily Session Note  Patient Details  Name: [name] Cook MRN: [id number] Date of Birth: 1941/06/26  Today's Date: 11/08/2016  SLP Individual Time: [id number]; [id number]- [id number] SLP Individual Time Calculation (min): 38 min; 47 min   Short Term Goals: Week 2: SLP Short Term Goal 1 (Week 2): Pt will attend to basic, familiar tasks for ~5 minutes intervals with Mod A verbal cues for redirection.  SLP Short Term Goal 2 (Week 2): Pt will attend to left field of environment during functional tasks with Mod A verbal cues.  SLP Short Term Goal 3 (Week 2): Pt will consume current diet without overt s/s of aspiration with Min A verbal cues for use of swallowing compensatory strategies.  SLP Short Term Goal 4 (Week 2): Pt will consume trials of nectar thick liquids without overt s/s of aspiration with Mod A verbal cues .  SLP Short Term Goal 5 (Week 2): Pt will utilize speech intelligibility strategies at the pharse level with Max A verbal cues to achieve 75% intelligibility.   Skilled Therapeutic Interventions:   Session 1: Pt was seen for skilled ST targeting dysphagia and cognition goals.  Pt eating breakfast with nurse tech upon arrival so therapist took over full supervision.  Pt needed max assist multimodal cues for redirection to task given that he was unable to sustain his attention to task for longer than 30 seconds.  Pt needed max assist multimodal cues to initiate swallow due to oral holding resulting from attention deficits.  Vocal quality was intermittently wet which SLP suspects to be related to pharyngeal residue mixed with secretions.  Mod assist verbal cues needed to monitor and correct anterior labial loss of materials.  Discussed current cognitive limitations and their impact on pt's swallowing safety with pt's wife.  SLP provided skilled education regarding distraction management techniques as well.  Pt left in bed with bed alarm set and call bell within reach.   Continue per current plan of care.  Session 2:  Pt was seen for skilled ST targeting cognition goals.  Pt requested a cup of coffee and needed mod-max assist verbal cues for swallow initiation due to decreased attention to boluses.  SLP facilitated the session with a basic, coin sorting task targeting visual scanning to the left of midline and sustained attention to task.  Pt was able to sustain his attention for up to 1 minute with max faded to mod assist verbal cues for redirection in a quiet environment.  Pt even began to demonstrate emerging divided attention to task as he was noted to be able to continue sorting coins while carrying on a brief conversation with therapist with min verbal cues for redirection.  Reviewed and reinforced recommendations for distraction management techniques.  Pt left in bed with bed alarm set and call bell within reach.  Continue per current plan of care.      Function:  Eating Eating   Modified Consistency Diet: Yes Eating Assist Level: Set up assist for;Helper checks for pocketed food;Helper scoops food on utensil;Helper brings food to mouth   Eating Set Up Assist For: Opening containers       Cognition Comprehension Comprehension assist level: Understands basic 90% of the time/cues < 10% of the time  Expression   Expression assist level: Expresses basic 90% of the time/requires cueing < 10% of the time.  Social Interaction Social Interaction assist level: Interacts appropriately 25 - 49% of time - Needs frequent redirection.  Problem Solving Problem solving assist level:  Solves basic 25 - 49% of the time - needs direction more than half the time to initiate, plan or complete simple activities  Memory Memory assist level: Recognizes or recalls 25 - 49% of the time/requires cueing 50 - 75% of the time    Pain Pain Assessment Pain Assessment: No/denies pain  Therapy/Group: Individual Therapy  Chardonnay Holzmann, Selinda Orion 11/08/2016, 12:30 PM

## 2016-11-09 ENCOUNTER — Inpatient Hospital Stay (HOSPITAL_COMMUNITY): Payer: Medicare Other | Admitting: Speech Pathology

## 2016-11-09 LAB — GLUCOSE, CAPILLARY
GLUCOSE-CAPILLARY: 143 mg/dL — AB (ref 65–99)
GLUCOSE-CAPILLARY: 124 mg/dL — AB (ref 65–99)
Glucose-Capillary: 129 mg/dL — ABNORMAL HIGH (ref 65–99)
Glucose-Capillary: 99 mg/dL (ref 65–99)

## 2016-11-09 NOTE — Progress Notes (Signed)
  [facility] PHYSICAL MEDICINE & REHABILITATION     PROGRESS NOTE    Subjective/Complaints:  Patient lying in bed. His only request is for water. He knows that he can't drink water. Fluid with Thick-It is not satisfying.  Objective: Vital Signs: Blood pressure (!) 144/66, pulse 98, temperature 97.9 F (36.6 C), temperature source Oral, resp. rate 18, height 5\' 8"  (1.727 m), weight 187 lb 11.2 oz (85.1 kg), SpO2 97 %.  Physical Exam:   Elderly male in no acute distress. HEENT exam: Atraumatic, normocephalic, neck is supple without jugular venous distention Chest clear to auscultation Cardiac exam S1 and S2 are regular Abdominal exam active bowel sounds, soft Extremities with trace edema pretibial. On the left. Status post right BKA.  Assessment/Plan: 1. Functional deficits secondary to right basal ganglia infarct   Medical Problem List and Plan: 1.  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  DVT Prophylaxis/Anticoagulation: Pharmaceutical: Lovenox 3. Pain Management: tylenol prn.  4. Mood: LCSW to follow for evaluation and support.  5. Neuropsych: This patient is not fully capable of making decisions on his own behalf. 6. Skin/Wound Care: routine pressure relief measures 7. Fluids/Electrolytes/Nutrition: Monitor I/O.   D1 Honey  Monitor hydration on honey liquids- concerns of adequate intake with ongoing hiccups and coughing due to difficulty with secretions.  8. HTN: Monitor BP.  143/65-144/66  Vitals:   11/08/16 1300 11/09/16 0515  BP: (!) 143/65 (!) 144/66  Pulse: 94 98  Resp: 18 18  Temp: 97.6 F (36.4 C) 97.9 F (36.6 C)   9. 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  11. Hiccups:   Currently resolved 12. GERD: On Zantac bid. 13. Pre-diabtes: Hgb A1c- 5.9. CBG (last 3)    Recent Labs  11/08/16 1650 11/08/16 2059 11/09/16 0634  GLUCAP 118* 132* 129*    0000000   Basic Metabolic Panel:    Component Value Date/Time   NA 144 11/08/2016 0448   NA 139 09/20/2016 1339   K 3.6 11/08/2016 0448   CL 116 (H) 11/08/2016 0448   CO2 22 11/08/2016 0448   BUN 34 (H) 11/08/2016 0448   BUN 14 09/20/2016 1339   CREATININE 1.28 (H) 11/08/2016 0448   CREATININE 0.96 09/08/2014 0219   GLUCOSE 143 (H) 11/08/2016 0448   CALCIUM 8.4 (L) 11/08/2016 0448    15. AKI  Dehydration +/- ACE  Started IVF at night, ACE d/ced  Cont to to trend up 12/15 16. Hypernatremia: Resolved  Na+ 144 on 12/15  Cont to monitor   LOS (Days) 10 A FACE TO FACE EVALUATION WAS PERFORMED  [name] Cook H [name] Cook 11/09/2016 10:20 AM

## 2016-11-10 ENCOUNTER — Inpatient Hospital Stay (HOSPITAL_COMMUNITY): Payer: Medicare Other | Admitting: Physical Therapy

## 2016-11-10 LAB — GLUCOSE, CAPILLARY
GLUCOSE-CAPILLARY: 146 mg/dL — AB (ref 65–99)
GLUCOSE-CAPILLARY: 128 mg/dL — AB (ref 65–99)
Glucose-Capillary: 126 mg/dL — ABNORMAL HIGH (ref 65–99)
Glucose-Capillary: 110 mg/dL — ABNORMAL HIGH (ref 65–99)

## 2016-11-10 NOTE — Progress Notes (Signed)
Physical Therapy Session Note  Patient Details  Name: [name] Cook MRN: [id number] Date of Birth: 12-Jun-1941  Today's Date: 11/10/2016 PT Individual Time: 1417-1505 PT Individual Time Calculation (min): 48 min    Short Term Goals: Week 2:  PT Short Term Goal 1 (Week 2): Patient will roll in bed with mod A to L and max A to R. PT Short Term Goal 2 (Week 2): Patient will perform transfers with mod A x 2 persons. PT Short Term Goal 3 (Week 2): Patient will maintain static sitting balance with mod A x 1 min. PT Short Term Goal 4 (Week 2): Patient will sustain attention to functional task x 30 sec with mod cues.  Skilled Therapeutic Interventions/Progress Updates: Patient in bed slumped to R side with wife present for session. Patient required total A to return to midline and instructed in rolling to R and L using bed rail with max multimodal cues and hand over hand assist for RUE placement with max A for LB dressing with +2A and donned R LE prosthesis and L shoe with total A. Patient transferred supine > sit using bed rail with max A and max verbal cues for sequencing. Patient instructed in leaning on RUE to decrease pushing tendencies to L and required min-max A for static sitting balance with cues to lead with head to prevent posterior LOB. Switched out TIS wheelchair for improved postural control and sitting tolerance OOB and adjusted head rest to promote midline orientation and decrease cervical rotation to L. After placing slide board with total A, patient assisted with lateral scoot transfer to R for first 90% of transfer with mod A of one person and required total A x 2 to complete last 10% of transfer to [place] wheelchair due to increased pushing with RUE resulting in therapist placing RUE in lap for remainder of transfer. Utilized standing frame x 10 min with manual facilitation to for midline orientation and trunk extension with max-total multimodal cues and mirror for visual feedback to  promote upright posture, midline reorientation, cervical rotation to neutral, sustained attention to task, decreasing pushing with cues for RUE placement (not gripping front or sides of tray), and attention to L, weightbearing L, and forced use L side with reaching tasks. Discharge planning with patient and wife regarding anticipated level of care needed at discharge and concerns regarding wife's plan to call on other people to come over when patient needs physical assist as patient currently will require 24/7 assist, wife in agreement and agreeable to SNF for continued rehab. Patient left in [place] wheelchair with wife.    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   Therapy/Group: Individual Therapy  Lovenia Debruler, Murray Hodgkins 11/10/2016, 4:40 PM

## 2016-11-10 NOTE — Plan of Care (Signed)
Problem: RH Car Transfers Goal: LTG Patient will perform car transfers with assist (PT) LTG: Patient will perform car transfers with assistance (PT).  Outcome: Not Applicable Date Met: 25/36/64 DC plan changed to SNF

## 2016-11-10 NOTE — Progress Notes (Signed)
  [place] PHYSICAL MEDICINE & REHABILITATION     PROGRESS NOTE    Subjective/Complaints: No complaints  Objective: Vital Signs: Blood pressure (!) 176/66, pulse 82, temperature 98.6 F (37 C), temperature source Axillary, resp. rate 18, height 5\' 8"  (1.727 m), weight 187 lb 11.2 oz (85.1 kg), SpO2 98 %.  Physical Exam:  elderly male in no acute distress. HEENT exam atraumatic, normocephalic, neck supple without jugular venous distention. Chest clear to auscultation cardiac exam S1-S2 are regular. Abdominal exam overweight with bowel sounds, soft and nontender.  Extremities with trace edema pretibial. On the left. Status post right BKA.  Assessment/Plan: 1. Functional deficits secondary to right basal ganglia infarct   Medical Problem List and Plan: 1.  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  DVT Prophylaxis/Anticoagulation: Pharmaceutical: Lovenox 3. Pain Management: tylenol prn.  4. Mood: LCSW to follow for evaluation and support.  5. Neuropsych: This patient is not fully capable of making decisions on his own behalf. 6. Skin/Wound Care: routine pressure relief measures 7. Fluids/Electrolytes/Nutrition: Monitor I/O.   D1 Honey  Monitor hydration on honey liquids- concerns of adequate intake with ongoing hiccups and coughing due to difficulty with secretions.  8. HTN: Monitor BP.  120/45-176/66 bp fluctuating- will monitor today 9. 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  11. Hiccups:   Currently resolved 12. GERD: On Zantac bid. 13. Pre-diabtes: Hgb A1c- 5.9. CBG (last 3)   Recent Labs  11/09/16 1659 11/09/16 2128 11/10/16 0648  GLUCAP 124* 99 126*    0000000   Basic Metabolic Panel:    Component Value Date/Time   NA 144 11/08/2016 0448   NA 139 09/20/2016 1339   K  3.6 11/08/2016 0448   CL 116 ([name]) 11/08/2016 0448   CO2 22 11/08/2016 0448   BUN 34 ([name]) 11/08/2016 0448   BUN 14 09/20/2016 1339   CREATININE 1.28 ([name]) 11/08/2016 0448   CREATININE 0.96 09/08/2014 0219   GLUCOSE 143 ([name]) 11/08/2016 0448   CALCIUM 8.4 (L) 11/08/2016 0448    15. AKI  Dehydration +/- ACE  Started IVF at night, ACE d/ced  Cont to to trend up 12/15  Check bmet in morning 16. Hypernatremia: Resolved  Na+ 144 on 12/15  Cont to monitor   LOS (Days) 11 A FACE TO FACE EVALUATION WAS PERFORMED  [name] Cook [name] Cook 11/10/2016 6:56 AM

## 2016-11-11 ENCOUNTER — Inpatient Hospital Stay (HOSPITAL_COMMUNITY): Payer: Medicare Other | Admitting: Physical Therapy

## 2016-11-11 ENCOUNTER — Inpatient Hospital Stay (HOSPITAL_COMMUNITY): Payer: Medicare Other | Admitting: Occupational Therapy

## 2016-11-11 ENCOUNTER — Inpatient Hospital Stay (HOSPITAL_COMMUNITY): Payer: Medicare Other

## 2016-11-11 ENCOUNTER — Inpatient Hospital Stay (HOSPITAL_COMMUNITY): Payer: Medicare Other | Admitting: Speech Pathology

## 2016-11-11 DIAGNOSIS — E871 Hypo-osmolality and hyponatremia: Secondary | ICD-10-CM

## 2016-11-11 LAB — CBC WITH DIFFERENTIAL/PLATELET
BASOS ABS: 0 10*3/uL (ref 0.0–0.1)
BASOS PCT: 1 %
EOS ABS: 0.4 10*3/uL (ref 0.0–0.7)
EOS PCT: 5 %
HCT: 39 % (ref 39.0–52.0)
HEMOGLOBIN: 13.1 g/dL (ref 13.0–17.0)
LYMPHS ABS: 1.1 10*3/uL (ref 0.7–4.0)
Lymphocytes Relative: 14 %
MCH: 33 pg (ref 26.0–34.0)
MCHC: 33.6 g/dL (ref 30.0–36.0)
MCV: 98.2 fL (ref 78.0–100.0)
Monocytes Absolute: 0.8 10*3/uL (ref 0.1–1.0)
Monocytes Relative: 9 %
NEUTROS PCT: 71 %
Neutro Abs: 5.8 10*3/uL (ref 1.7–7.7)
PLATELETS: 359 10*3/uL (ref 150–400)
RBC: 3.97 MIL/uL — AB (ref 4.22–5.81)
RDW: 12.4 % (ref 11.5–15.5)
WBC: 8.1 10*3/uL (ref 4.0–10.5)

## 2016-11-11 LAB — GLUCOSE, CAPILLARY
GLUCOSE-CAPILLARY: 122 mg/dL — AB (ref 65–99)
GLUCOSE-CAPILLARY: 132 mg/dL — AB (ref 65–99)
GLUCOSE-CAPILLARY: 104 mg/dL — AB (ref 65–99)
GLUCOSE-CAPILLARY: 118 mg/dL — AB (ref 65–99)

## 2016-11-11 LAB — BASIC METABOLIC PANEL
Anion gap: 8 (ref 5–15)
BUN: 22 mg/dL — AB (ref 6–20)
CHLORIDE: 112 mmol/L — AB (ref 101–111)
CO2: 24 mmol/L (ref 22–32)
CREATININE: 0.93 mg/dL (ref 0.61–1.24)
Calcium: 8.8 mg/dL — ABNORMAL LOW (ref 8.9–10.3)
Glucose, Bld: 122 mg/dL — ABNORMAL HIGH (ref 65–99)
Potassium: 3.7 mmol/L (ref 3.5–5.1)
SODIUM: 144 mmol/L (ref 135–145)

## 2016-11-11 MED ORDER — HYDRALAZINE HCL 50 MG PO TABS
50.0000 mg | ORAL_TABLET | Freq: Three times a day (TID) | ORAL | Status: DC
  Administered 2016-11-11 – 2016-11-21 (×28): 50 mg via ORAL
  Filled 2016-11-11 (×31): qty 1

## 2016-11-11 MED ORDER — FLUOXETINE HCL 20 MG PO CAPS
20.0000 mg | ORAL_CAPSULE | Freq: Every day | ORAL | Status: DC
  Administered 2016-11-12 – 2016-11-21 (×10): 20 mg via ORAL
  Filled 2016-11-11 (×11): qty 1

## 2016-11-11 MED ORDER — BACLOFEN 5 MG HALF TABLET
5.0000 mg | ORAL_TABLET | Freq: Two times a day (BID) | ORAL | Status: DC
  Administered 2016-11-11 – 2016-11-14 (×6): 5 mg via ORAL
  Filled 2016-11-11 (×6): qty 1

## 2016-11-11 NOTE — Progress Notes (Addendum)
Occupational Therapy Session Note  Patient Details  Name: [name] Cook MRN: [id number] Date of Birth: 03/12/1941  Today's Date: 11/11/2016 OT Individual Time: 2633-3545 OT Individual Time Calculation (min): 60 min   Short Term Goals: Week 2:  OT Short Term Goal 1 (Week 2): Pt will maintan sitting balance with min A during simple ADL task OT Short Term Goal 2 (Week 2): Pt will transfer with mod A +2 with LRAD bed/ chair in prep for ADL task OT Short Term Goal 3 (Week 2): Pt will demonstrate safe management of left UE with bed mobility with mod cuing  OT Short Term Goal 4 (Week 2): Pt will roll for LB clothing management with mod A with bed rails   Skilled Therapeutic Interventions/Progress Updates:    Pt greeted in bed, slumped to R, requiring Max A to return to midline. Incontinent of bladder, total A peri-care and brief change. Bed level LB bathing/dressing completed w/ total A +2 and max multimodal cues. Total A +2 to transfer sup<>sit. Worked on sitting balance and trunk control seated EOB with manual facilitation for weight shifting and midline orientation. Pushes to L.  Total A +2 sliding board transfer to TIS w/c. Oral care completed at the sink with focus on sequencing, initiation, attention, and  one-handed modifications. OT also fit pt for give-more sling to provide support to flaccid L Ue.  Pt left with family present and needs met at end of session.   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   Therapy/Group: Individual Therapy  Valma Cava 11/11/2016, 12:50 PM

## 2016-11-11 NOTE — Progress Notes (Signed)
Physical Therapy Session Note  Patient Details  Name: Arthas Vasil MRN: [id number] Date of Birth: 1941-10-10  Today's Date: 11/11/2016 PT Individual Time: [id number] PT Individual Time Calculation (min): 58 min   Skilled Therapeutic Interventions/Progress Updates:    Pt lying in bed at onset of session today.  Trialed lying pt on left side prior to session to address shorted R trunk prior to attempting transfer.   Majority of session today involved addressing deficits in functional mobility to decrease burden of care.  Pt performed sliding board transfer to w/c with max A x 2.  Pt continues to benefits from cueing to turn head to the left.  Performed standing frame x 3 trials today with work on R side trunk elongation by having pt reach to make "tick marks" on mirror with marker.  Following treatment today, pt seated posture improved dramatically.  No redness noted on residual limb following session.  Pt does have bandage over residual limb.  Therapist discussed this with nursing who stated that she would look into the cause/need for bandage.   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    See Function Navigator for Current Functional Status.   Therapy/Group: Individual Therapy  Tammye Kahler Hilario Quarry 11/11/2016, 3:18 PM

## 2016-11-11 NOTE — Progress Notes (Signed)
[place] PHYSICAL MEDICINE & REHABILITATION     PROGRESS NOTE    Subjective/Complaints: Pt seen laying in bed this AM.  He states he slept well overnight.  He thinks it is Friday.   ROS: Denies CP, SOB, nausea, vomiting, diarrhea, but ?reliability.  Objective: Vital Signs: Blood pressure (!) 188/73, pulse 83, temperature 97.8 F (36.6 C), temperature source Oral, resp. rate 16, height 5\' 8"  (1.727 m), weight 85.1 kg (187 lb 11.2 oz), SpO2 99 %. No results found. No results for input(s): WBC, HGB, HCT, PLT in the last 72 hours. No results for input(s): NA, K, CL, GLUCOSE, BUN, CREATININE, CALCIUM in the last 72 hours.  Invalid input(s): CO CBG (last 3)   Recent Labs  11/10/16 1646 11/10/16 2202 11/11/16 0559  GLUCAP 128* 110* 122*    Wt Readings from Last 3 Encounters:  11/06/16 85.1 kg (187 lb 11.2 oz)  10/28/16 92 kg (202 lb 12.8 oz)  09/20/16 95.8 kg (211 lb 4 oz)    Physical Exam:  Constitutional: NAD. Vital signs reviewed.   HENT: Normocephalic and atraumatic.  Eyes: EOM are normal. No discharge.  Cardiovascular: RRR, +murmur. No JVD. Respiratory: clear. Unlabored GI: Soft. Bowel sounds are normal. Musculoskeletal: He exhibits no edema. He exhibits no tenderness.  RLE BKA healed Neurological: He is alert.  Alert Dysarthria Left facial weakness Motor: LUE/LLE: 0/5. mAS 1/4 with finger flexors (stable) RUE: 5/5 proximal to distal RLE: HF 5/5. Skin: Skin is warm and dry and intact.  Psychiatric: Flat.   Assessment/Plan: 1. Functional deficits secondary to right basal ganglia infarct which require 3+ hours per day of interdisciplinary therapy in a comprehensive inpatient rehab setting. Physiatrist is providing close team supervision and 24 hour management of active medical problems listed below. Physiatrist and rehab team continue to assess barriers to discharge/monitor patient progress toward functional and medical goals.  Function:  Bathing Bathing  position   Position: Bed  Bathing parts Body parts bathed by patient: Left arm, Chest, Abdomen, Front perineal area, Right upper leg, Left upper leg Body parts bathed by helper: Left lower leg, Buttocks, Right arm  Bathing assist Assist Level: 2 helpers      Upper Body Dressing/Undressing Upper body dressing   What is the patient wearing?: Pull over shirt/dress     Pull over shirt/dress - Perfomed by patient: Put head through opening, Thread/unthread right sleeve Pull over shirt/dress - Perfomed by helper: Pull shirt over trunk, Thread/unthread left sleeve        Upper body assist Assist Level: 2 helpers      Lower Body Dressing/Undressing Lower body dressing   What is the patient wearing?: Pants, AFO, Socks, Shoes       Pants- Performed by helper: Thread/unthread right pants leg, Thread/unthread left pants leg, Pull pants up/down       Socks - Performed by helper: Don/doff right sock, Don/doff left sock   Shoes - Performed by helper: Don/doff right shoe, Don/doff left shoe   AFO - Performed by helper: Don/doff right AFO, Don/doff left AFO      Lower body assist Assist for lower body dressing: 2 Helpers      Toileting Toileting Toileting activity did not occur: No continent bowel/bladder event   Toileting steps completed by helper: Adjust clothing prior to toileting, Performs perineal hygiene, Adjust clothing after toileting Toileting Assistive Devices: Toilet aid  Toileting assist Assist level: Two helpers   Transfers Chair/bed transfer   Chair/bed transfer method: Lateral scoot Chair/bed transfer  assist level: 2 helpers Chair/bed transfer assistive device: Sliding board Mechanical lift: Maximove   Locomotion Ambulation Ambulation activity did not occur: Safety/medical concerns         Wheelchair   Type: Manual   Assist Level: Dependent (Pt equals 0%)  Cognition Comprehension Comprehension assist level: Understands basic 90% of the time/cues < 10% of  the time  Expression Expression assist level: Expresses basic 90% of the time/requires cueing < 10% of the time.  Social Interaction Social Interaction assist level: Interacts appropriately 25 - 49% of time - Needs frequent redirection.  Problem Solving Problem solving assist level: Solves basic 25 - 49% of the time - needs direction more than half the time to initiate, plan or complete simple activities  Memory Memory assist level: Recognizes or recalls 25 - 49% of the time/requires cueing 50 - 75% of the time   Medical Problem List and Plan: 1.  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  DVT Prophylaxis/Anticoagulation: Pharmaceutical: Lovenox 3. Pain Management: tylenol prn. Continue to educate patient family on LUE positioning.  4. Mood: LCSW to follow for evaluation and support.  5. Neuropsych: This patient is not fully capable of making decisions on his own behalf. 6. Skin/Wound Care: routine pressure relief measures 7. Fluids/Electrolytes/Nutrition: Monitor I/O.   D1 Honey  Monitor hydration on honey liquids- concerns of adequate intake with ongoing hiccups and coughing due to difficulty with secretions.  8. HTN: Monitor BP. Goal of BP 130-150--avoid hypotension  Lisinopril 2.5 started 12/11, increased to 7.5 on 12/12, increased to 10 on 12/13, d/ced on 12/16.    Norvasc 5mg  started 12/12, increased to 10 on 12/16  Hydralazine 25 TID started 12/16, increased to 50 on 12/18  Labile, but elevated for the most part, will increase hydralizine  Will cont to monitor Vitals:   11/10/16 1529 11/11/16 0403  BP: 139/73 (!) 188/73  Pulse: 86 83  Resp: 18 16  Temp: 98.4 F (36.9 C) 97.8 F (36.6 C)   9. 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  11. Hiccups:   Baclofen increased  to 10mg  QID monitor for sedation, ?resolved, weaned to 5mg  on 12/13, decreased to BID on 12/18 12. GERD: On Zantac bid. 13. Pre-diabtes: Hgb A1c- 5.9. CBG (last 3)   Recent Labs  11/10/16 1646 11/10/16 2202 11/11/16 0559  GLUCAP 128* 110* 122*   Stable 14. Mild hyponatremia  K+ 3.6 on 12/15 after supplementation  Will cont to monitor  Labs ordered for tomorrow 15. AKI  Dehydration +/- ACE  Started IVF at night, ACE d/ced  Cont to to trend up 12/15  Labs ordered for tomorrow 16. Hypernatremia: Resolved  Na+ 144 on 12/15  Cont to monitor   LOS (Days) 12 A FACE TO FACE EVALUATION WAS PERFORMED  Abriel Hattery Lorie Phenix 11/11/2016 11:27 AM

## 2016-11-11 NOTE — Progress Notes (Signed)
Speech Language Pathology Daily Session Note  Patient Details  Name: [name] Cook MRN: [id number] Date of Birth: 1941/03/27  Today's Date: 11/11/2016 SLP Individual Time: 1125-1215 SLP Individual Time Calculation (min): 50 min   Short Term Goals:Week 2: SLP Short Term Goal 1 (Week 2): Pt will attend to basic, familiar tasks for ~5 minutes intervals with Mod A verbal cues for redirection.  SLP Short Term Goal 2 (Week 2): Pt will attend to left field of environment during functional tasks with Mod A verbal cues.  SLP Short Term Goal 3 (Week 2): Pt will consume current diet without overt s/s of aspiration with Min A verbal cues for use of swallowing compensatory strategies.  SLP Short Term Goal 4 (Week 2): Pt will consume trials of nectar thick liquids without overt s/s of aspiration with Mod A verbal cues .  SLP Short Term Goal 5 (Week 2): Pt will utilize speech intelligibility strategies at the pharse level with Max A verbal cues to achieve 75% intelligibility.   Skilled Therapeutic Interventions: Pt was seen for skilled ST targeting cognitive goals.  Pt awake in wheelchair upon arrival requesting to go to the bathroom.  Pt was transferred back to bed with [facility] and assist from nurse tech.  Pt placed on bedpan but attempt to have a bowel movement was unsuccessful.  Therapist facilitated the session with a novel card game targeting visual scanning and sustained attention to task.  Pt was able to sustain his attention to task for up to 1 minute with mod assist verbal cues for redirection.  He needed max assist verbal cues for functional problem solving during task.  Pt was left in bed with wife at bedside and bed alarm set.  Continue per current plan of care.       Function:  Eating Eating              Cognition Comprehension Comprehension assist level: Understands basic 90% of the time/cues < 10% of the time  Expression   Expression assist level: Expresses basic 90% of the  time/requires cueing < 10% of the time.  Social Interaction Social Interaction assist level: Interacts appropriately 50 - 74% of the time - May be physically or verbally inappropriate.  Problem Solving Problem solving assist level: Solves basic 25 - 49% of the time - needs direction more than half the time to initiate, plan or complete simple activities  Memory Memory assist level: Recognizes or recalls 25 - 49% of the time/requires cueing 50 - 75% of the time    Pain Pain Assessment Pain Assessment: No/denies pain  Therapy/Group: Individual Therapy  Pritesh Sobecki, Selinda Orion 11/11/2016, 3:43 PM

## 2016-11-11 NOTE — Progress Notes (Signed)
Physical Therapy Session Note  Patient Details  Name: [name] Cook MRN: [id number] Date of Birth: 1941/10/12  Today's Date: 11/11/2016 PT Individual Time: 1500-1530 PT Individual Time Calculation (min): 30 min    Skilled Therapeutic Interventions/Progress Updates:    Session focused on neuro re-ed to address postural control, sitting balance, and transfers with slideboard back to bed. Pt requires total +2 for transfer with max cues for technique, sequencing, and hand placement with strong push to the L. EOB attempted to have patient propped on R elbow to promote trunk elongation/stretching. Pt unable to maintain midline orientation during transfer and during sitting balance activities EOB requiring up to total assist. Returned to supine and positioned for comfort with LUE supported on pillow.    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   Therapy/Group: Individual Therapy  Canary Brim Ivory Broad, PT, DPT  11/11/2016, 4:03 PM

## 2016-11-12 ENCOUNTER — Inpatient Hospital Stay (HOSPITAL_COMMUNITY): Payer: Medicare Other | Admitting: Physical Therapy

## 2016-11-12 ENCOUNTER — Inpatient Hospital Stay (HOSPITAL_COMMUNITY): Payer: Medicare Other | Admitting: Occupational Therapy

## 2016-11-12 ENCOUNTER — Inpatient Hospital Stay (HOSPITAL_COMMUNITY): Payer: Medicare Other | Admitting: Speech Pathology

## 2016-11-12 LAB — GLUCOSE, CAPILLARY
GLUCOSE-CAPILLARY: 133 mg/dL — AB (ref 65–99)
Glucose-Capillary: 131 mg/dL — ABNORMAL HIGH (ref 65–99)
Glucose-Capillary: 113 mg/dL — ABNORMAL HIGH (ref 65–99)
Glucose-Capillary: 121 mg/dL — ABNORMAL HIGH (ref 65–99)

## 2016-11-12 NOTE — Progress Notes (Signed)
Occupational Therapy Session Note  Patient Details  Name: [name] Cook MRN: [id number] Date of Birth: [birth year]  Today's Date: 11/12/2016 OT Individual Time: 0800-0900 OT Individual Time Calculation (min): 60 min    Short Term Goals: Week 2:  OT Short Term Goal 1 (Week 2): Pt will maintan sitting balance with min A during simple ADL task OT Short Term Goal 2 (Week 2): Pt will transfer with mod A +2 with LRAD bed/ chair in prep for ADL task OT Short Term Goal 3 (Week 2): Pt will demonstrate safe management of left UE with bed mobility with mod cuing  OT Short Term Goal 4 (Week 2): Pt will roll for LB clothing management with mod A with bed rails   Skilled Therapeutic Interventions/Progress Updates:    Pt greeted in bed, perseverative on where wife is and slightly confused this morning;. knew it was December, but thought it was Christmas. Re-oriented pt, then worked on bed mobility and LB bathing/dressing. Pt incontinent of stool, requiring total A +2 for peri-care and brief change. Max A to stay in sidelying to L + max multimodal cues to look L and maintain position. With bed demi-reclined for back support, Mod instructional cues and Min A+ set-up A, pt able to don R residual limb sock. Total A +2 sup> sit. Worked on sitting balance, trunk control, and UB dressing at EOB. Max multimodal cues to initiate and sequence UB dressing task. Total A + 2 sliding board transfer to R + strong push to L requiring Max multimidal cues for hand placement.   Pt demonstrated carryover with one-handed techniques when brushing teeth today. Pt left sitting up at sink with spouse and RN present to shave with his electric razor.   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   Therapy/Group: Individual Therapy  Valma Cava 11/12/2016, 12:51 PM

## 2016-11-12 NOTE — Progress Notes (Signed)
Speech Language Pathology Daily Session Note  Patient Details  Name: [name] Cook MRN: [id number] Date of Birth: 07-May-1941  Today's Date: 11/12/2016 SLP Individual Time: [id number] SLP Individual Time Calculation (min): 45 min   Short Term Goals: Week 2: SLP Short Term Goal 1 (Week 2): Pt will attend to basic, familiar tasks for ~5 minutes intervals with Mod A verbal cues for redirection.  SLP Short Term Goal 2 (Week 2): Pt will attend to left field of environment during functional tasks with Mod A verbal cues.  SLP Short Term Goal 3 (Week 2): Pt will consume current diet without overt s/s of aspiration with Min A verbal cues for use of swallowing compensatory strategies.  SLP Short Term Goal 4 (Week 2): Pt will consume trials of nectar thick liquids without overt s/s of aspiration with Mod A verbal cues .  SLP Short Term Goal 5 (Week 2): Pt will utilize speech intelligibility strategies at the pharse level with Max A verbal cues to achieve 75% intelligibility.   Skilled Therapeutic Interventions: Pt was seen for skilled ST targeting cognitive and dysphagia goals.  Pt needed mod assist verbal cues to locate items on his left when completing oral care but was able to sustain his attention to task for ~90 seconds with min-mod verbal cues for redirection. Mod assist multimodal cues needed for task organization.  After oral care, therapist facilitated the session with trials of thin and nectar thick liquids to continue working towards diet progression.  Pt demonstrated immediate coughing on ~25% of trials of thin liquids but was noted with improved timeliness of swallow initiation.  Pt with increased oral holding with trials of nectar thick liquids but no overt s/s of aspiration with limited presentations.  Reviewed and reinforced rationale for currently prescribed diet and precautions.  Pt verbalized understanding but will continue to reinforce as pt has poor carryover of daily information.  Pt was  returned to room and left in wheelchair with wife at bedside, quick release belt donned, and call bell left within reach.  Continue per current plan of care.    Function:  Eating Eating   Modified Consistency Diet: Yes Eating Assist Level: Supervision or verbal cues           Cognition Comprehension Comprehension assist level: Understands basic 90% of the time/cues < 10% of the time  Expression   Expression assist level: Expresses basic 90% of the time/requires cueing < 10% of the time.  Social Interaction Social Interaction assist level: Interacts appropriately 50 - 74% of the time - May be physically or verbally inappropriate.  Problem Solving Problem solving assist level: Solves basic 25 - 49% of the time - needs direction more than half the time to initiate, plan or complete simple activities  Memory Memory assist level: Recognizes or recalls 25 - 49% of the time/requires cueing 50 - 75% of the time    Pain Pain Assessment Pain Assessment: No/denies pain  Therapy/Group: Individual Therapy  Adelaine Roppolo, Selinda Orion 11/12/2016, 3:40 PM

## 2016-11-12 NOTE — Plan of Care (Signed)
Problem: RH BOWEL ELIMINATION Goal: RH STG MANAGE BOWEL WITH ASSISTANCE STG Manage Bowel with moderate Assistance.   Outcome: Progressing Bowel regimen: Senakote and suppository.   Problem: RH BLADDER ELIMINATION Goal: RH STG MANAGE BLADDER WITH EQUIPMENT WITH ASSISTANCE STG Manage Bladder With Equipment With moderate Assistance    Outcome: Progressing Condom Cath in place.  Problem: RH SKIN INTEGRITY Goal: RH STG SKIN FREE OF INFECTION/BREAKDOWN Moderate assistance  Outcome: Progressing Turning pt q2hrs.  Problem: RH SAFETY Goal: RH STG ADHERE TO SAFETY PRECAUTIONS W/ASSISTANCE/DEVICE STG Adhere to Safety Precautions With minimal Assistance/Device.   Outcome: Progressing Bed alarm in place and call bell in reach.

## 2016-11-12 NOTE — Progress Notes (Signed)
Physical Therapy Session Note  Patient Details  Name: [name] Cook MRN: [id number] Date of Birth: 1940-12-04  Today's Date: 11/12/2016 PT Individual Time: [id number] PT Individual Time Calculation (min): 60 min    Short Term Goals: Week 2:  PT Short Term Goal 1 (Week 2): Patient will roll in bed with mod A to L and max A to R. PT Short Term Goal 2 (Week 2): Patient will perform transfers with mod A x 2 persons. PT Short Term Goal 3 (Week 2): Patient will maintain static sitting balance with mod A x 1 min. PT Short Term Goal 4 (Week 2): Patient will sustain attention to functional task x 30 sec with mod cues.  Skilled Therapeutic Interventions/Progress Updates:  Pt received in w/c with wife present.  Pt agreeable to therapy. Pt transported in w/c to gym and engaged in lateral leans in w/c to set up for slideboard transfer.  Pt performed slideboard transfer w/c > mat to R side with +2 total A due to no extension activation in LLE to advance hips to R.  Pt positioned on mat with wedge under L pelvis for L trunk shortening and R trunk elongation and engaged in NMR in sitting and R sidelying; see below.  Returned to w/c performing slideboard transfer to L with therapist on R side of pt to provide facilitation at trunk and head for head position in midline, trunk elongation, forward lean and weight shift and verbal cues to initiate scooting to L side with +2 but pt performing 30-40%.  Pt tilted in w/c and returned to room to await SLP session.   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 Braces or Orthoses: Sling Other Brace/Splint: sling for LUE after fall Restrictions Weight Bearing Restrictions: No Other Treatments: Treatments Neuromuscular Facilitation: Right;Left;Upper Extremity;Lower Extremity;Forced use;Activity to increase motor control;Activity to increase timing and sequencing;Activity to  increase grading;Activity to increase sustained activation;Activity to increase lateral weight shifting;Activity to increase anterior-posterior weight shifting seated on mat with R hip wedged while performing reaching with RUE up, forwards, R out of BOS and across midline for horseshoes with manual facilitation of head and trunk position to midline and to facilitate core activation, upright trunk, and controlled weight shift forwards and to the R with max A.  Pt transitioned to R sidelying and R sidelying on elbow with focus on L lateral neck and trunk activation for shortening and AAROM of LLE flexion<>extension.  Returned to sitting with max A.  See Function Navigator for Current Functional Status.   Therapy/Group: Individual Therapy  [name] Cook [facility] 11/12/2016, 11:52 AM

## 2016-11-12 NOTE — Progress Notes (Signed)
[facility] PHYSICAL MEDICINE & REHABILITATION     PROGRESS NOTE    Subjective/Complaints: Pt seen laying in bed this AM.  He slept well overnight.  He notes improvement in his LLE.    ROS: Denies CP, SOB, nausea, vomiting, diarrhea  Objective: Vital Signs: Blood pressure 138/78, pulse 78, temperature 97.9 F (36.6 C), temperature source Oral, resp. rate 18, height 5\' 8"  (1.727 m), weight 85.1 kg (187 lb 11.2 oz), SpO2 98 %. No results found.  Recent Labs  11/11/16 1442  WBC 8.1  HGB 13.1  HCT 39.0  PLT 359    Recent Labs  11/11/16 1442  NA 144  K 3.7  CL 112*  GLUCOSE 122*  BUN 22*  CREATININE 0.93  CALCIUM 8.8*   CBG (last 3)   Recent Labs  11/11/16 1638 11/11/16 2126 11/12/16 0634  GLUCAP 104* 118* 131*    Wt Readings from Last 3 Encounters:  11/06/16 85.1 kg (187 lb 11.2 oz)  10/28/16 92 kg (202 lb 12.8 oz)  09/20/16 95.8 kg (211 lb 4 oz)    Physical Exam:  Constitutional: NAD. Vital signs reviewed.   HENT: Normocephalic and atraumatic.  Eyes: EOM are normal. No discharge.  Cardiovascular: RRR, +murmur. No JVD. Respiratory: clear. Unlabored GI: Soft. Bowel sounds are normal. Musculoskeletal: He exhibits no edema. He exhibits no tenderness.  RLE BKA healed Neurological: He is alert.  Alert and oriented x3 with cues. Dysarthria Left facial weakness (stable) Motor: LUE 0/5. mAS 1/4 with finger flexors (unchanged) LLE: HF 1/5, HE 2+/5, KE 1+/5, ADF 1+/5 RUE: 5/5 proximal to distal RLE: HF 5/5. Skin: Skin is warm and dry and intact.  Psychiatric: Flat.   Assessment/Plan: 1. Functional deficits secondary to right basal ganglia infarct which require 3+ hours per day of interdisciplinary therapy in a comprehensive inpatient rehab setting. Physiatrist is providing close team supervision and 24 hour management of active medical problems listed below. Physiatrist and rehab team continue to assess barriers to discharge/monitor patient progress toward  functional and medical goals.  Function:  Bathing Bathing position   Position: Bed  Bathing parts Body parts bathed by patient: Left arm, Chest, Abdomen, Front perineal area, Right upper leg, Left upper leg Body parts bathed by helper: Left lower leg, Buttocks, Right arm  Bathing assist Assist Level: 2 helpers      Upper Body Dressing/Undressing Upper body dressing   What is the patient wearing?: Pull over shirt/dress     Pull over shirt/dress - Perfomed by patient: Put head through opening, Thread/unthread right sleeve Pull over shirt/dress - Perfomed by helper: Pull shirt over trunk, Thread/unthread left sleeve        Upper body assist Assist Level: 2 helpers      Lower Body Dressing/Undressing Lower body dressing   What is the patient wearing?: Pants, AFO, Socks, Shoes       Pants- Performed by helper: Thread/unthread right pants leg, Thread/unthread left pants leg, Pull pants up/down       Socks - Performed by helper: Don/doff right sock, Don/doff left sock   Shoes - Performed by helper: Don/doff right shoe, Don/doff left shoe   AFO - Performed by helper: Don/doff right AFO      Lower body assist Assist for lower body dressing: 2 Helpers      Toileting Toileting Toileting activity did not occur: No continent bowel/bladder event   Toileting steps completed by helper: Adjust clothing prior to toileting, Performs perineal hygiene, Adjust clothing after toileting Toileting  Assistive Devices: Toilet aid  Toileting assist Assist level: Two helpers   Transfers Chair/bed transfer   Chair/bed transfer method: Lateral scoot Chair/bed transfer assist level: 2 helpers Chair/bed transfer assistive device: Sliding board, Armrests Mechanical lift: Maximove   Locomotion Ambulation Ambulation activity did not occur: Safety/medical concerns         Wheelchair   Type: Manual   Assist Level: Dependent (Pt equals 0%)  Cognition Comprehension Comprehension assist  level: Understands basic 90% of the time/cues < 10% of the time  Expression Expression assist level: Expresses basic 90% of the time/requires cueing < 10% of the time.  Social Interaction Social Interaction assist level: Interacts appropriately 50 - 74% of the time - May be physically or verbally inappropriate.  Problem Solving Problem solving assist level: Solves basic 25 - 49% of the time - needs direction more than half the time to initiate, plan or complete simple activities  Memory Memory assist level: Recognizes or recalls 25 - 49% of the time/requires cueing 50 - 75% of the time   Medical Problem List and Plan: 1.  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  DVT Prophylaxis/Anticoagulation: Pharmaceutical: Lovenox 3. Pain Management: tylenol prn. Continue to educate patient family on LUE positioning.  4. Mood: LCSW to follow for evaluation and support.  5. Neuropsych: This patient is not fully capable of making decisions on his own behalf. 6. Skin/Wound Care: routine pressure relief measures 7. Fluids/Electrolytes/Nutrition: Monitor I/O.   D1 Honey  Monitor hydration on honey liquids- concerns of adequate intake with ongoing hiccups and coughing due to difficulty with secretions.  8. HTN: Monitor BP. Goal of BP 130-150--avoid hypotension  Lisinopril 2.5 started 12/11, increased to 7.5 on 12/12, increased to 10 on 12/13, d/ced on 12/16.    Norvasc 5mg  started 12/12, increased to 10 on 12/16  Hydralazine 25 TID started 12/16, increased to 50 on 12/18  Controlled 12/19  Will cont to monitor Vitals:   11/11/16 2202 11/12/16 0500  BP: 138/61 138/78  Pulse:  78  Resp:  18  Temp:  97.9 F (36.6 C)   9. 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  11. Hiccups:   Baclofen  increased to 10mg  QID monitor for sedation, ?resolved, weaned to 5mg  on 12/13, decreased to BID on 12/18 12. GERD: On Zantac bid. 13. Pre-diabtes: Hgb A1c- 5.9. CBG (last 3)   Recent Labs  11/11/16 1638 11/11/16 2126 11/12/16 0634  GLUCAP 104* 118* 131*   Stable 12/19 14. Mild hyponatremia: Resolved  K+ 3.7 on 12/18 after supplementation  Will cont to monitor 15. AKI  Dehydration +/- ACE  Started IVF at night, ACE d/ced  Cont to to trend up 12/15  Cr 0.93 on 12/18 16. Hypernatremia: Resolved  Na+ 144 on 12/158  Cont to monitor   LOS (Days) 13 A FACE TO FACE EVALUATION WAS PERFORMED  Semisi Biela Lorie Phenix 11/12/2016 11:05 AM

## 2016-11-12 NOTE — NC FL2 (Signed)
[place] LEVEL OF CARE SCREENING TOOL     IDENTIFICATION  Patient Name: [name] Cook Birthdate: Feb 07, 1941 Sex: male Admission Date (Current Location): 10/30/2016  [facility] and [place] Number:  [employer] and Address:  [facility] [place], [place] [address] [place], [place] [address]      Provider Number: [id number]  Attending Physician Name and Address:  Ankit Lorie Phenix, MD  Relative Name and Phone Number:  Bazil [phone]    Current Level of Care: Other (Comment) (rehab) Recommended Level of Care: [place] Prior Approval Number:    Date Approved/Denied:   PASRR Number: [id number]  Discharge Plan: SNF    Current Diagnoses: Patient Active Problem List   Diagnosis Date Noted  . Hyponatremia   . AKI (acute kidney injury) ([place])   . Labile blood pressure   . Hypernatremia   . Hypertensive urgency   . 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 Occlusion and stenosis of carotid artery   . Right basal ganglia embolic stroke ([place]) [id number]  . Cerebral infarction due to stenosis of right carotid artery ([place])   . Gastroesophageal reflux disease without esophagitis   . S/P BKA (below knee amputation) unilateral, right ([place])   . Benign essential HTN   . Hiccups   . Flaccid hemiplegia of left nondominant side due to infarction of brain ([place])   . Cerebral infarction ([place])   . Hypertensive crisis   . Acute lower UTI   . Stenosis of carotid artery   . Prediabetes   . CVA (cerebral vascular accident) ([place]) 10/28/2016  . Bilateral carotid artery stenosis   . Left-sided weakness 10/27/2016  . Ischemic stroke ([place]) 10/27/2016  . Acute ischemic stroke ([place]) 10/27/2016  . NSTEMI (non-ST elevated myocardial infarction) ([place]) 10/12/2014  . Hx of right BKA ([place]) 10/12/2014  . 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 Atherosclerosis of  native arteries of extremity with intermittent claudication ([place]) 12/08/2013  . Diabetes mellitus with circulatory complication ([place])   . Essential hypertension, benign 07/17/2013  . Hypokalemia 07/17/2013  . Non-ST elevation myocardial infarction (NSTEMI), initial episode of care ([place]) 07/16/2013  . GERD (gastroesophageal reflux disease) 03/15/2013  . Peripheral vascular disease ([place]) 03/03/2013  . CAROTID BRUIT 02/06/2011  . Dyslipidemia 10/31/2009  . Essential hypertension 10/31/2009  . Coronary atherosclerosis 10/31/2009    Orientation RESPIRATION BLADDER Height & Weight     Self, Place  Normal Incontinent Weight: 187 lb 11.2 oz (85.1 kg) Height:  5\' 8"  (172.7 cm)  BEHAVIORAL SYMPTOMS/MOOD NEUROLOGICAL BOWEL NUTRITION STATUS      Incontinent Diet (Dys 1 honey thick liquids)  AMBULATORY STATUS COMMUNICATION OF NEEDS Skin   Extensive Assist Verbally Normal                       Personal Care Assistance Level of Assistance  Bathing, Feeding, Dressing Bathing Assistance: Maximum assistance Feeding assistance: Limited assistance Dressing Assistance: Maximum assistance     Functional Limitations Info  Sight, Speech Sight Info: Impaired   Speech Info: Impaired    SPECIAL CARE FACTORS FREQUENCY  PT (By licensed PT), OT (By licensed OT), Speech therapy, Bowel and bladder program     PT Frequency: 5 x week OT Frequency: 5 x week Bowel and Bladder Program Frequency: timed tolieting   Speech Therapy Frequency: 5 x week      Contractures Contractures Info:  Not present    Additional Factors Info  Code Status, Allergies Code Status Info: Full Code Allergies Info: Altace-ramipril, Ambien-zolpidem tartrate           Current Medications (11/12/2016):  This is the current hospital active medication list Current Facility-Administered Medications  Medication Dose Route Frequency Provider Last Rate Last Dose  . acetaminophen (TYLENOL) tablet 325-650 mg  325-650 mg  Oral Q4H PRN Bary Leriche, PA-C   650 mg at 11/10/16 0847  . alum & mag hydroxide-simeth (MAALOX/MYLANTA) 200-200-20 MG/5ML suspension 30 mL  30 mL Oral Q4H PRN Ivan Anchors Love, PA-C      . amLODipine (NORVASC) tablet 10 mg  10 mg Oral QPC supper Ankit Lorie Phenix, MD   10 mg at 11/11/16 1820  . aspirin suppository 300 mg  300 mg Rectal Daily Pamela S Love, PA-C       Or  . aspirin tablet 325 mg  325 mg Oral Daily Bary Leriche, PA-C   325 mg at 11/12/16 0856  . atorvastatin (LIPITOR) tablet 80 mg  80 mg Oral q1800 Bary Leriche, PA-C   80 mg at 11/11/16 1820  . baclofen (LIORESAL) tablet 5 mg  5 mg Oral BID Ankit Lorie Phenix, MD   5 mg at 11/12/16 0800  . bisacodyl (DULCOLAX) suppository 10 mg  10 mg Rectal Daily PRN Bary Leriche, PA-C   10 mg at 11/03/16 1836  . calcium-vitamin D (OSCAL WITH D) 500-200 MG-UNIT per tablet 1 tablet  1 tablet Oral Daily Bary Leriche, PA-C   1 tablet at 11/12/16 0856  . chlorhexidine (PERIDEX) 0.12 % solution 15 mL  15 mL Mouth Rinse BID Bary Leriche, PA-C   15 mL at 11/12/16 0855  . clopidogrel (PLAVIX) tablet 75 mg  75 mg Oral Daily Bary Leriche, PA-C   75 mg at 11/12/16 0855  . diphenhydrAMINE (BENADRYL) 12.5 MG/5ML elixir 12.5-25 mg  12.5-25 mg Oral Q6H PRN Ivan Anchors Love, PA-C      . diphenhydrAMINE (BENADRYL) capsule 25 mg  25 mg Oral QHS PRN Bary Leriche, PA-C      . enoxaparin (LOVENOX) injection 40 mg  40 mg Subcutaneous Q24H Pamela S Love, PA-C   40 mg at 11/11/16 1339  . famotidine (PEPCID) tablet 20 mg  20 mg Oral BID Bary Leriche, PA-C   20 mg at 11/12/16 0855  . feeding supplement (PRO-STAT SUGAR FREE 64) liquid 30 mL  30 mL Oral BID Ivan Anchors Love, PA-C   30 mL at 11/12/16 0856  . finasteride (PROSCAR) tablet 5 mg  5 mg Oral Daily Bary Leriche, PA-C   5 mg at 11/12/16 0856  . FLUoxetine (PROZAC) capsule 20 mg  20 mg Oral Daily Ankit Lorie Phenix, MD   20 mg at 11/12/16 0856  . food thickener (THICK IT) powder   Oral PRN Ivan Anchors Love, PA-C      .  guaiFENesin-dextromethorphan (ROBITUSSIN DM) 100-10 MG/5ML syrup 5-10 mL  5-10 mL Oral Q6H PRN Ivan Anchors Love, PA-C      . hydrALAZINE (APRESOLINE) tablet 50 mg  50 mg Oral Q8H Ankit Lorie Phenix, MD   50 mg at 11/12/16 0658  . hydrocortisone (ANUSOL-HC) suppository 25 mg  25 mg Rectal BID Bary Leriche, PA-C   25 mg at 11/12/16 0857  . insulin aspart (novoLOG) injection 0-9 Units  0-9 Units Subcutaneous TID WC Bary Leriche, PA-C   1 Units at  11/12/16 0857  . lidocaine (XYLOCAINE) 2 % jelly   Topical PRN Bary Leriche, PA-C      . multivitamin with minerals tablet 1 tablet  1 tablet Oral Daily Bary Leriche, PA-C   1 tablet at 11/12/16 0856  . prochlorperazine (COMPAZINE) tablet 5-10 mg  5-10 mg Oral Q6H PRN Bary Leriche, PA-C       Or  . prochlorperazine (COMPAZINE) injection 5-10 mg  5-10 mg Intramuscular Q6H PRN Bary Leriche, PA-C       Or  . prochlorperazine (COMPAZINE) suppository 12.5 mg  12.5 mg Rectal Q6H PRN Bary Leriche, PA-C      . senna-docusate (Senokot-S) tablet 2 tablet  2 tablet Oral BID Bary Leriche, PA-C   2 tablet at 11/12/16 0856  . sodium chloride 0.45 % 1,000 mL with potassium chloride 20 mEq infusion   Intravenous Q24H Bary Leriche, PA-C 83 mL/hr at 11/11/16 1800    . sodium phosphate (FLEET) 7-19 GM/118ML enema 1 enema  1 enema Rectal Once PRN Bary Leriche, PA-C      . tamsulosin (FLOMAX) capsule 0.8 mg  0.8 mg Oral Daily Ivan Anchors Love, PA-C   0.8 mg at 11/12/16 0856  . traZODone (DESYREL) tablet 25-50 mg  25-50 mg Oral QHS PRN Bary Leriche, PA-C   50 mg at 10/31/16 2244     Discharge Medications: Please see discharge summary for a list of discharge medications.  Relevant Imaging Results:  Relevant Lab Results:   Additional Information getting IV's to hydrate him at night  Derica Leiber, Gardiner Rhyme, LCSW

## 2016-11-12 NOTE — Progress Notes (Signed)
Occupational Therapy Session Note  Patient Details  Name: [name] Cook MRN: [id number] Date of Birth: [birth year]  Today's Date: 11/12/2016 OT Individual Time: [id number] OT Individual Time Calculation (min): 30 min     Short Term Goals: Week 2:  OT Short Term Goal 1 (Week 2): Pt will maintan sitting balance with min A during simple ADL task OT Short Term Goal 2 (Week 2): Pt will transfer with mod A +2 with LRAD bed/ chair in prep for ADL task OT Short Term Goal 3 (Week 2): Pt will demonstrate safe management of left UE with bed mobility with mod cuing  OT Short Term Goal 4 (Week 2): Pt will roll for LB clothing management with mod A with bed rails   Skilled Therapeutic Interventions/Progress Updates:    Treatment session with focus on upright sitting balance and trunk control.  Pt received in bed, requiring mod-max assistance +2 for bed mobility to sequence coming up to sitting at EOB.  Engaged in trunk control and reaching out and up to Rt to promote weight shifting through Rt hip to facilitate increased elongation of trunk on Rt and shortening on Lt to improve sitting balance.  Therapist placed Lt hand in weight bearing position out to Lt during weight shift to Lt to then facilitate lengthening on Lt and shortening on Rt.  Multimodal cues for upright sitting posture and to return to midline throughout reaching task.  Pt able to maintain static sitting balance 15-20 seconds before losing balance backwards.  Returned to bed +2 and left semi-reclined in bed.  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 Braces or Orthoses: Sling Other Brace/Splint: sling for LUE after fall Restrictions Weight Bearing Restrictions: No General:   Vital Signs: Therapy Vitals Temp: 98.4 F (36.9 C) Temp Source: Oral Pulse Rate: 86 Resp: 18 BP: 133/61 Patient Position (if appropriate): Lying Oxygen  Therapy SpO2: 95 % O2 Device: Not Delivered Pain: Pain Assessment Pain Assessment: No/denies pain  See Function Navigator for Current Functional Status.   Therapy/Group: Individual Therapy  [name] 11/12/2016, 4:07 PM

## 2016-11-12 NOTE — Progress Notes (Signed)
Pt did not sleep at all. Was confused and worried about his wife. Passed on in shift change. PRN sleep med was not given because it was too late in the morning.

## 2016-11-13 ENCOUNTER — Inpatient Hospital Stay (HOSPITAL_COMMUNITY): Payer: Medicare Other | Admitting: Occupational Therapy

## 2016-11-13 ENCOUNTER — Inpatient Hospital Stay (HOSPITAL_COMMUNITY): Payer: Medicare Other

## 2016-11-13 ENCOUNTER — Inpatient Hospital Stay (HOSPITAL_COMMUNITY): Payer: Medicare Other | Admitting: Speech Pathology

## 2016-11-13 DIAGNOSIS — R1310 Dysphagia, unspecified: Secondary | ICD-10-CM

## 2016-11-13 LAB — BASIC METABOLIC PANEL
ANION GAP: 8 (ref 5–15)
BUN: 20 mg/dL (ref 6–20)
CALCIUM: 8.4 mg/dL — AB (ref 8.9–10.3)
CO2: 21 mmol/L — ABNORMAL LOW (ref 22–32)
Chloride: 113 mmol/L — ABNORMAL HIGH (ref 101–111)
Creatinine, Ser: 0.91 mg/dL (ref 0.61–1.24)
GFR calc non Af Amer: >60 mL/min (ref >60)
Glucose, Bld: 131 mg/dL — ABNORMAL HIGH (ref 65–99)
POTASSIUM: 3.5 mmol/L (ref 3.5–5.1)
SODIUM: 142 mmol/L (ref 135–145)

## 2016-11-13 LAB — GLUCOSE, CAPILLARY
GLUCOSE-CAPILLARY: 133 mg/dL — AB (ref 65–99)
GLUCOSE-CAPILLARY: 133 mg/dL — AB (ref 65–99)
GLUCOSE-CAPILLARY: 132 mg/dL — AB (ref 65–99)
Glucose-Capillary: 131 mg/dL — ABNORMAL HIGH (ref 65–99)

## 2016-11-13 LAB — CBC
HCT: 36.4 % — ABNORMAL LOW (ref 39.0–52.0)
Hemoglobin: 12 g/dL — ABNORMAL LOW (ref 13.0–17.0)
MCH: 32.6 pg (ref 26.0–34.0)
MCHC: 33 g/dL (ref 30.0–36.0)
MCV: 98.9 fL (ref 78.0–100.0)
PLATELETS: 323 10*3/uL (ref 150–400)
RBC: 3.68 MIL/uL — AB (ref 4.22–5.81)
RDW: 12.9 % (ref 11.5–15.5)
WBC: 6.8 10*3/uL (ref 4.0–10.5)

## 2016-11-13 NOTE — Progress Notes (Signed)
Speech Language Pathology Daily Session Note  Patient Details  Name: [name] Cook MRN: [id number] Date of Birth: 1940/11/28  Today's Date: 11/13/2016 SLP Individual Time: 1310-1405 SLP Individual Time Calculation (min): 55 min   Short Term Goals: Week 2: SLP Short Term Goal 1 (Week 2): Pt will attend to basic, familiar tasks for ~5 minutes intervals with Mod A verbal cues for redirection.  SLP Short Term Goal 2 (Week 2): Pt will attend to left field of environment during functional tasks with Mod A verbal cues.  SLP Short Term Goal 3 (Week 2): Pt will consume current diet without overt s/s of aspiration with Min A verbal cues for use of swallowing compensatory strategies.  SLP Short Term Goal 4 (Week 2): Pt will consume trials of nectar thick liquids without overt s/s of aspiration with Mod A verbal cues .  SLP Short Term Goal 5 (Week 2): Pt will utilize speech intelligibility strategies at the pharse level with Max A verbal cues to achieve 75% intelligibility.   Skilled Therapeutic Interventions:  Pt was seen for skilled ST targeting goals for dysphagia and cognition.  SLP facilitated the session with a trial meal tray of dys 2 textures and honey thick liquids to continue working towards diet progression.  Pt consumed advanced solids with min assist verbal cues for use of swallowing precautions to clear residuals from the oral cavity post swallow.  Pt demonstrated decreased incidence of oral holding due to improved attention to boluses.   Pt was able to sustain his attention to meal for 1-2 minute intervals with min verbal cues for redirection.  Pt was even noted to alternate his attention between brief conversation with therapist and meal with mod assist verbal cues for redirection.  Pt needed max assist instructional cues to identify current deficits post CVA and their impact on his functional independence.  Pt was left in bed with wife at bedside.  Continue per current plan of care.     Function:  Eating Eating   Modified Consistency Diet: Yes Eating Assist Level: Supervision or verbal cues   Eating Set Up Assist For: Opening containers Helper Scoops Food on Utensil: Occasionally Helper Brings Food to Mouth: Occasionally   Cognition Comprehension Comprehension assist level: Follows basic conversation/direction with extra time/assistive device  Expression   Expression assist level: Expresses basic needs/ideas: With extra time/assistive device  Social Interaction Social Interaction assist level: Interacts appropriately 50 - 74% of the time - May be physically or verbally inappropriate.  Problem Solving Problem solving assist level: Solves basic 25 - 49% of the time - needs direction more than half the time to initiate, plan or complete simple activities  Memory Memory assist level: Recognizes or recalls 25 - 49% of the time/requires cueing 50 - 75% of the time    Pain Pain Assessment Pain Assessment: No/denies pain  Therapy/Group: Individual Therapy  Mesha Schamberger, Selinda Orion 11/13/2016, 2:15 PM

## 2016-11-13 NOTE — Progress Notes (Signed)
[facility] PHYSICAL MEDICINE & REHABILITATION     PROGRESS NOTE    Subjective/Complaints: Pt seen laying in bed this AM.  He is sleepy.  He denies complaints.    ROS: Denies CP, SOB, nausea, vomiting, diarrhea  Objective: Vital Signs: Blood pressure (!) 162/65, pulse 93, temperature 98 F (36.7 C), temperature source Oral, resp. rate 17, height 5\' 8"  (1.727 m), weight 85.9 kg (189 lb 6.4 oz), SpO2 96 %. No results found.  Recent Labs  11/11/16 1442 11/13/16 0637  WBC 8.1 6.8  HGB 13.1 12.0*  HCT 39.0 36.4*  PLT 359 323    Recent Labs  11/11/16 1442 11/13/16 0637  NA 144 142  K 3.7 3.5  CL 112* 113*  GLUCOSE 122* 131*  BUN 22* 20  CREATININE 0.93 0.91  CALCIUM 8.8* 8.4*   CBG (last 3)   Recent Labs  11/12/16 2105 11/13/16 0647 11/13/16 1106  GLUCAP 121* 133* 133*    Wt Readings from Last 3 Encounters:  11/13/16 85.9 kg (189 lb 6.4 oz)  10/28/16 92 kg (202 lb 12.8 oz)  09/20/16 95.8 kg (211 lb 4 oz)    Physical Exam:  Constitutional: NAD. Vital signs reviewed.   HENT: Normocephalic and atraumatic.  Eyes: EOM are normal. No discharge.  Cardiovascular: RRR, +murmur. No JVD. Respiratory: clear. Unlabored.  GI: Soft. Bowel sounds are normal. Musculoskeletal: He exhibits no edema. He exhibits no tenderness.  RLE BKA healed Neurological: He is alert.  Alert and oriented x3 with cues. Dysarthria Left facial weakness (stable) Motor: LUE 0/5. mAS 1/4 with finger flexors (stable) LLE: HF 1/5, HE 2+/5, KE 1+/5, ADF 1+/5 RUE: 5/5 proximal to distal RLE: HF 5/5. Skin: Skin is warm and dry and intact.  Psychiatric: Flat.   Assessment/Plan: 1. Functional deficits secondary to right basal ganglia infarct which require 3+ hours per day of interdisciplinary therapy in a comprehensive inpatient rehab setting. Physiatrist is providing close team supervision and 24 hour management of active medical problems listed below. Physiatrist and rehab team continue to  assess barriers to discharge/monitor patient progress toward functional and medical goals.  Function:  Bathing Bathing position   Position: Bed  Bathing parts Body parts bathed by patient: Left arm, Chest, Abdomen, Front perineal area, Right upper leg, Left upper leg Body parts bathed by helper: Left lower leg, Buttocks, Right arm  Bathing assist Assist Level: 2 helpers      Upper Body Dressing/Undressing Upper body dressing   What is the patient wearing?: Pull over shirt/dress     Pull over shirt/dress - Perfomed by patient: Thread/unthread right sleeve, Put head through opening Pull over shirt/dress - Perfomed by helper: Pull shirt over trunk, Thread/unthread left sleeve        Upper body assist Assist Level: 2 helpers      Lower Body Dressing/Undressing Lower body dressing   What is the patient wearing?: Pants, AFO, Socks, Shoes       Pants- Performed by helper: Thread/unthread left pants leg, Thread/unthread right pants leg, Pull pants up/down     Socks - Performed by patient: Don/doff right sock (stump sock) Socks - Performed by helper: Don/doff left sock   Shoes - Performed by helper: Don/doff left shoe   AFO - Performed by helper: Don/doff right AFO      Lower body assist Assist for lower body dressing: 2 Helpers      Toileting Toileting Toileting activity did not occur: No continent bowel/bladder event   Toileting steps completed  by helper: Adjust clothing prior to toileting, Performs perineal hygiene, Adjust clothing after toileting Toileting Assistive Devices: Toilet aid  Toileting assist Assist level: Two helpers   Transfers Chair/bed transfer   Chair/bed transfer method: Lateral scoot Chair/bed transfer assist level: 2 helpers Chair/bed transfer assistive device: Sliding board, Armrests Mechanical lift: Maximove   Locomotion Ambulation Ambulation activity did not occur: Safety/medical concerns         Wheelchair   Type: Manual   Assist  Level: Dependent (Pt equals 0%)  Cognition Comprehension Comprehension assist level: Understands basic 90% of the time/cues < 10% of the time  Expression Expression assist level: Expresses basic 90% of the time/requires cueing < 10% of the time.  Social Interaction Social Interaction assist level: Interacts appropriately 50 - 74% of the time - May be physically or verbally inappropriate.  Problem Solving Problem solving assist level: Solves basic 25 - 49% of the time - needs direction more than half the time to initiate, plan or complete simple activities  Memory Memory assist level: Recognizes or recalls 25 - 49% of the time/requires cueing 50 - 75% of the time   Medical Problem List and Plan: 1.  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  DVT Prophylaxis/Anticoagulation: Pharmaceutical: Lovenox 3. Pain Management: tylenol prn. Continue to educate patient family on LUE positioning.  4. Mood: LCSW to follow for evaluation and support.  5. Neuropsych: This patient is not fully capable of making decisions on his own behalf. 6. Skin/Wound Care: routine pressure relief measures 7. Fluids/Electrolytes/Nutrition: Monitor I/O.   D1 Honey  Monitor hydration on honey liquids- concerns of adequate intake with ongoing hiccups and coughing due to difficulty with secretions.  8. HTN: Monitor BP. Goal of BP 130-150--avoid hypotension  Lisinopril 2.5 started 12/11, increased to 7.5 on 12/12, increased to 10 on 12/13, d/ced on 12/16.    Norvasc 5mg  started 12/12, increased to 10 on 12/16  Hydralazine 25 TID started 12/16, increased to 50 on 12/18  Elevated this AM, otherwise relatively controlled 12/20  Will cont to monitor Vitals:   11/12/16 2111 11/13/16 0523  BP: (!) 133/57 (!) 162/65  Pulse: 92 93  Resp:  17  Temp:  98 F (36.7 C)    9. 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  11. Hiccups:   Baclofen increased to 10mg  QID monitor for sedation, ?resolved, weaned to 5mg  on 12/13, decreased to BID on 12/18 12. GERD: On Zantac bid. 13. Pre-diabtes: Hgb A1c- 5.9. CBG (last 3)   Recent Labs  11/12/16 2105 11/13/16 0647 11/13/16 1106  GLUCAP 121* 133* 133*   Stable 12/20 14. Mild hyponatremia: Resolved  K+ 3.5 on 12/20 after supplementation  Will cont to monitor 15. AKI  Dehydration +/- ACE  Started IVF at night, ACE d/ced  Cr 0.91 on 12/20 (stable) 16. Hypernatremia: Resolved  Na+ 144 on 12/158  Cont to monitor   LOS (Days) 14 A FACE TO FACE EVALUATION WAS PERFORMED  [name] Cook [name] Cook 11/13/2016 12:45 PM

## 2016-11-13 NOTE — Progress Notes (Signed)
Physical Therapy Note  Patient Details  Name: Calijah Vanderhoef MRN: [id number] Date of Birth: 01/12/1941 Today's Date: 11/13/2016  1505-1605, 60 min individual tx Pain: none reported  tx focused on L attention during bed mobility, sitting.  Rolling L in bed with max cues , max assist.  R sidelying to sitting with HOB raised, using RUE to push up on bed; pt attempts to use railing to pull up with poor results.  Midline orientation in sitting EOB via wt shifting and reaching with RUE to prevent pushing toward hemi side.  R lateral leans to practice pushing up with RUE.  Sitting balance required mod/max assist due to L inattention, leaning L and pushing to L.  Pt turned head L to scan wife to his L and discuss her clothes; during this dual task activity, pt had LOB L repeatedly and poor awareness.  Slide board transfer required +2 to L bed > w/c.  Stedy used for bi LE wt bearing, +2 for sit > stand x 3.  Pt unable to tolerate standing x > 10 seconds.  RLE trembled with wt bearing and little LLE extension felt by therapist.  Seated L hip extension with multimodal cues, x 10 reps. L hand noted to be swollen; PT instructed wife and son Tyten in retrograde massage using lotion.  Pt provided with L elbow protector.  Pt left resting in w/c with all needs within reach.  See function navigator for current status.   Anaiz Qazi 11/13/2016, 4:15 PM

## 2016-11-13 NOTE — Patient Care Conference (Signed)
Inpatient RehabilitationTeam Conference and Plan of Care Update Date: 11/13/2016   Time: 2:20 PM    Patient Name: [name] Cook      Medical Record Number: [id number]  Date of Birth: August 24, 1941 Sex: Male         Room/Bed: 4W22C/4W22C-01 Payor Info: Payor: MEDICARE / Plan: MEDICARE PART A AND B / Product Type: *No Product type* /    Admitting Diagnosis: R CVA  Admit Date/Time:  10/30/2016  6:32 PM Admission Comments: No comment available   Primary Diagnosis:  <principal problem not specified> Principal Problem: <principal problem not specified>  Patient Active Problem List   Diagnosis Date Noted  . Dysphagia   . Hyponatremia   . AKI (acute kidney injury) ([place])   . Labile blood pressure   . Hypernatremia   . Hypertensive urgency   . 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 Occlusion and stenosis of carotid artery   . Right basal ganglia embolic stroke ([place]) [id number]  . Cerebral infarction due to stenosis of right carotid artery ([place])   . Gastroesophageal reflux disease without esophagitis   . S/P BKA (below knee amputation) unilateral, right ([place])   . Benign essential HTN   . Hiccups   . 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 Cerebral infarction ([place])   . Hypertensive crisis   . Acute lower UTI   . Stenosis of carotid artery   . Prediabetes   . CVA (cerebral vascular accident) ([place]) 10/28/2016  . Bilateral carotid artery stenosis   . Left-sided weakness 10/27/2016  . Ischemic stroke ([place]) 10/27/2016  . Acute ischemic stroke ([place]) 10/27/2016  . NSTEMI (non-ST elevated myocardial infarction) ([place]) 10/12/2014  . Hx of right BKA ([place]) 10/12/2014  . 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 Atherosclerosis of native arteries of extremity with intermittent claudication ([place]) 12/08/2013  . Diabetes mellitus with circulatory complication ([place])   . Essential  hypertension, benign 07/17/2013  . Hypokalemia 07/17/2013  . Non-ST elevation myocardial infarction (NSTEMI), initial episode of care ([place]) 07/16/2013  . GERD (gastroesophageal reflux disease) 03/15/2013  . Peripheral vascular disease ([place]) 03/03/2013  . CAROTID BRUIT 02/06/2011  . Dyslipidemia 10/31/2009  . Essential hypertension 10/31/2009  . Coronary atherosclerosis 10/31/2009    Expected Discharge Date: Expected Discharge Date: 11/20/16  Team Members Present: Physician leading conference: Dr. 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 LBM 11/12/16. Has small smears as well.   Maintain time toileting, usage of condom cath  monitor for changes in B/B status   Swallow/Nutrition/ Hydration   IV fluids 7p-7a; poor appetite on honey thick fluids  min assist with least restrictive diet   trials of advanced textures to continue working towards repeat objective assessment    ADL's   dependent  Overall Mod A  attention, awareness, NMR, pt/family ed, modified bathing/dressing, sitting balance, functional transfers   Mobility   maxi move  mod A  functional mobiliy, attention, awareness, patient/family education, balance  Communication             Safety/Cognition/ Behavioral Observations  follows commands  mod assist   continue to address basic cognition    Pain   no pain   maintain pain level under 2 on 0-10 scale  manage pain as needed with pharmacological and non-pharmacological measures   Skin   no skin issues, hx right BKA  pt will  maintain skin integrity  monitor for skin breakdown      *See Care Plan and progress notes for long and short-term goals.  Barriers to Discharge: Mobility, transfers, safety, awareness, HTN, AKI    Possible Resolutions to Barriers:  Therapies, follow labs, IVF, optimize BP meds    Discharge Planning/Teaching Needs:  per CN / conference pt. is going for NHP [facility]      Team Discussion:  Making slow progress with attention and carry over. Still requiring plus 2 assist-total assist. Upgraded to Dys 2 honey thick diet, MBS next week. Still pushes to the left. Still receiving IV's at night for hydration. Family not able to provide this amount of care at home. Begin looking for NH bed  Revisions to Treatment Plan:  NHP   Continued Need for Acute Rehabilitation Level of Care: The patient requires daily medical management by a physician with specialized training in physical medicine and rehabilitation for the following conditions: Daily direction of a multidisciplinary physical rehabilitation program to ensure safe treatment while eliciting the highest outcome that is of practical value to the patient.: Yes Daily medical management of patient stability for increased activity during participation in an intensive rehabilitation regime.: Yes Daily analysis of laboratory values and/or radiology reports with any subsequent need for medication adjustment of medical intervention for : Neurological problems;Blood pressure problems;Other  [name] 11/14/2016, 8:54 AM

## 2016-11-13 NOTE — Progress Notes (Signed)
Occupational Therapy Session Note  Patient Details  Name: [name] Cook MRN: [id number] Date of Birth: 11/08/1941  Today's Date: 11/13/2016 OT Individual Time: 0900-1000 OT Individual Time Calculation (min): 60 min   Short Term Goals: Week 2:  OT Short Term Goal 1 (Week 2): Pt will maintan sitting balance with min A during simple ADL task OT Short Term Goal 2 (Week 2): Pt will transfer with mod A +2 with LRAD bed/ chair in prep for ADL task OT Short Term Goal 3 (Week 2): Pt will demonstrate safe management of left UE with bed mobility with mod cuing  OT Short Term Goal 4 (Week 2): Pt will roll for LB clothing management with mod A with bed rails   Skilled Therapeutic Interventions/Progress Updates:   Pt greeted in bed with nursing present w/ severe lateral lean to left Max A to come to midline. RN administered meds w/ cues to swallow and check L cheek pocket. Total A LB dressing today, but improved roll to L with Max multimodal cues. Total A +2 sup<>sit, addressed UB dressing using hemi-techniques w/ max multimodal cues. Mod-Total A to maintain static sitting balance today with constant cues and redirection. Pt was able to assist with slliding board transfer today, requiring Max A +2. Pt left in TIS w/c with safety belt on and needs met.   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 Braces or Orthoses: Sling Other Brace/Splint: sling for LUE after fall Restrictions Weight Bearing Restrictions: No Pain: Pain Assessment Pain Assessment: No/denies pain ADL: ADL ADL Comments: see functional navigator  See Function Navigator for Current Functional Status.   Therapy/Group: Individual Therapy   S  11/13/2016, 3:59 PM  

## 2016-11-13 NOTE — Progress Notes (Signed)
Occupational Therapy Session Note  Patient Details  Name: [name] Cook MRN: [id number] Date of Birth: 18-Oct-1941  Today's Date: 11/13/2016 OT Individual Time: 1034-1100 OT Individual Time Calculation (min): 26 min     Short Term Goals: Week 2:  OT Short Term Goal 1 (Week 2): Pt will maintan sitting balance with min A during simple ADL task OT Short Term Goal 2 (Week 2): Pt will transfer with mod A +2 with LRAD bed/ chair in prep for ADL task OT Short Term Goal 3 (Week 2): Pt will demonstrate safe management of left UE with bed mobility with mod cuing  OT Short Term Goal 4 (Week 2): Pt will roll for LB clothing management with mod A with bed rails   Skilled Therapeutic Interventions/Progress Updates:    Treatment session with focus on Lt attention, initiation, and sitting balance.  Pt received in tilt in space w/c attempting to phone wife.  Assisted pt with pushing buttons on phone.  Engaged in [place] in sitting with focus on Lt attention and initiation.  Pt demonstrating good sitting balance with side supports and BLE supported.  Pt scored 25 and then 26 using inner 3 rings.  Pt easily distracted with each trial when people would pass visual field on Rt requiring cues to continue to attend to task.  Pt with slower times in Lt visual field as he would require increased time, but no cues, to scan to Lt.  Returned to room and left tilted back in w/c with quick release belt donned and all needs in reach.  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 Braces or Orthoses: Sling Other Brace/Splint: sling for LUE after fall Restrictions Weight Bearing Restrictions: No Pain:  Pt with no c/o pain  See Function Navigator for Current Functional Status.   Therapy/Group: Individual Therapy  [name] 11/13/2016, 11:31 AM

## 2016-11-14 ENCOUNTER — Inpatient Hospital Stay (HOSPITAL_COMMUNITY): Payer: Medicare Other | Admitting: Speech Pathology

## 2016-11-14 ENCOUNTER — Inpatient Hospital Stay (HOSPITAL_COMMUNITY): Payer: Medicare Other | Admitting: Occupational Therapy

## 2016-11-14 ENCOUNTER — Inpatient Hospital Stay (HOSPITAL_COMMUNITY): Payer: Medicare Other

## 2016-11-14 DIAGNOSIS — R066 Hiccough: Secondary | ICD-10-CM

## 2016-11-14 LAB — GLUCOSE, CAPILLARY
Glucose-Capillary: 122 mg/dL — ABNORMAL HIGH (ref 65–99)
Glucose-Capillary: 129 mg/dL — ABNORMAL HIGH (ref 65–99)

## 2016-11-14 MED ORDER — BACLOFEN 1 MG/ML ORAL SUSPENSION
2.5000 mg | Freq: Four times a day (QID) | ORAL | Status: DC | PRN
  Administered 2016-11-16 – 2016-11-17 (×2): 2.5 mg via ORAL
  Filled 2016-11-14 (×4): qty 0.25

## 2016-11-14 MED ORDER — POTASSIUM CHLORIDE IN NACL 20-0.45 MEQ/L-% IV SOLN
INTRAVENOUS | Status: DC
  Administered 2016-11-14 – 2016-11-19 (×7): via INTRAVENOUS
  Filled 2016-11-14 (×7): qty 1000

## 2016-11-14 NOTE — Plan of Care (Signed)
Problem: RH Bed to Chair Transfers Goal: LTG Patient will perform bed/chair transfers w/assist (PT) LTG: Patient will perform bed/chair transfers with assistance, with/without cues (PT).  Downgraded due to slow progress. ABG

## 2016-11-14 NOTE — Progress Notes (Signed)
Recreational Therapy Session Note  Patient Details  Name: [name] Cook MRN: [id number] Date of Birth: 1941-08-01 Today's Date: 11/14/2016   Pt placed on HOLD due to low activity tolerance.  Will continue to monitor through team. Linkin Vizzini 11/14/2016, 8:15 AM

## 2016-11-14 NOTE — Progress Notes (Signed)
[facility] PHYSICAL MEDICINE & REHABILITATION     PROGRESS NOTE    Subjective/Complaints: Pt seen laying in bed this AM.  He slept well overnight.  He denies complaints this AM.     ROS: Denies CP, SOB, nausea, vomiting, diarrhea  Objective: Vital Signs: Blood pressure (!) 155/57, pulse 87, temperature 97.7 F (36.5 C), temperature source Oral, resp. rate 18, height 5\' 8"  (1.727 m), weight 85.9 kg (189 lb 6.4 oz), SpO2 98 %. No results found.  Recent Labs  11/13/16 0637  WBC 6.8  HGB 12.0*  HCT 36.4*  PLT 323    Recent Labs  11/13/16 0637  NA 142  K 3.5  CL 113*  GLUCOSE 131*  BUN 20  CREATININE 0.91  CALCIUM 8.4*   CBG (last 3)   Recent Labs  11/13/16 2145 11/14/16 0625 11/14/16 1138  GLUCAP 131* 122* 129*    Wt Readings from Last 3 Encounters:  11/13/16 85.9 kg (189 lb 6.4 oz)  10/28/16 92 kg (202 lb 12.8 oz)  09/20/16 95.8 kg (211 lb 4 oz)    Physical Exam:  Constitutional: NAD. Vital signs reviewed.   HENT: Normocephalic and atraumatic.  Eyes: EOM are normal. No discharge.  Cardiovascular: RRR, +murmur. No JVD. Respiratory: clear. Unlabored.  GI: Soft. Bowel sounds are normal. Musculoskeletal: He exhibits no edema. He exhibits no tenderness.  RLE BKA healed Neurological: He is alert.  Alert and oriented x3 with cues. Dysarthria Left facial weakness (stable) Motor: LUE 0/5. mAS 1/4 with finger flexors (unchanged) LLE: HF 1/5, HE 2+/5, KE 1+/5, ADF 1+/5 RUE: 5/5 proximal to distal RLE: HF 5/5. Skin: Skin is warm and dry and intact.  Psychiatric: Flat.   Assessment/Plan: 1. Functional deficits secondary to right basal ganglia infarct which require 3+ hours per day of interdisciplinary therapy in a comprehensive inpatient rehab setting. Physiatrist is providing close team supervision and 24 hour management of active medical problems listed below. Physiatrist and rehab team continue to assess barriers to discharge/monitor patient progress  toward functional and medical goals.  Function:  Bathing Bathing position   Position: Bed  Bathing parts Body parts bathed by patient: Left arm, Chest, Abdomen, Front perineal area, Right upper leg, Left upper leg Body parts bathed by helper: Left lower leg, Buttocks, Right arm, Back  Bathing assist Assist Level: 2 helpers      Upper Body Dressing/Undressing Upper body dressing   What is the patient wearing?: Pull over shirt/dress     Pull over shirt/dress - Perfomed by patient: Thread/unthread right sleeve, Put head through opening Pull over shirt/dress - Perfomed by helper: Pull shirt over trunk, Thread/unthread left sleeve        Upper body assist Assist Level: 2 helpers      Lower Body Dressing/Undressing Lower body dressing   What is the patient wearing?: Pants, AFO, Socks, Shoes       Pants- Performed by helper: Thread/unthread left pants leg, Thread/unthread right pants leg, Pull pants up/down     Socks - Performed by patient: Don/doff right sock (stump sock) Socks - Performed by helper: Don/doff left sock   Shoes - Performed by helper: Don/doff left shoe   AFO - Performed by helper: Don/doff right AFO      Lower body assist Assist for lower body dressing: 2 Helpers      Toileting Toileting Toileting activity did not occur: No continent bowel/bladder event   Toileting steps completed by helper: Adjust clothing prior to toileting, Performs perineal hygiene,  Adjust clothing after toileting Toileting Assistive Devices: Toilet aid  Toileting assist Assist level: Two helpers   Transfers Chair/bed transfer   Chair/bed transfer method: Lateral scoot Chair/bed transfer assist level: 2 helpers Chair/bed transfer assistive device: Sliding board, Armrests, Prosthesis Mechanical lift: Maximove   Locomotion Ambulation Ambulation activity did not occur: Safety/medical concerns         Wheelchair   Type: Manual   Assist Level: Dependent (Pt equals 0%)   Cognition Comprehension Comprehension assist level: Follows basic conversation/direction with extra time/assistive device  Expression Expression assist level: Expresses basic needs/ideas: With extra time/assistive device  Social Interaction Social Interaction assist level: Interacts appropriately 50 - 74% of the time - May be physically or verbally inappropriate.  Problem Solving Problem solving assist level: Solves basic 25 - 49% of the time - needs direction more than half the time to initiate, plan or complete simple activities  Memory Memory assist level: Recognizes or recalls 25 - 49% of the time/requires cueing 50 - 75% of the time   Medical Problem List and Plan: 1.  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  DVT Prophylaxis/Anticoagulation: Pharmaceutical: Lovenox 3. Pain Management: tylenol prn. Continue to educate patient family on LUE positioning.  4. Mood: LCSW to follow for evaluation and support.  5. Neuropsych: This patient is not fully capable of making decisions on his own behalf. 6. Skin/Wound Care: routine pressure relief measures 7. Fluids/Electrolytes/Nutrition: Monitor I/O.   D1 Honey, advanced to D2 Honey  Monitor hydration on honey liquids- concerns of adequate intake with ongoing hiccups and coughing due to difficulty with secretions.  8. HTN: Monitor BP. Goal of BP 130-150--avoid hypotension  Lisinopril 2.5 started 12/11, increased to 7.5 on 12/12, increased to 10 on 12/13, d/ced on 12/16.    Norvasc 5mg  started 12/12, increased to 10 on 12/16  Hydralazine 25 TID started 12/16, increased to 50 on 12/18  Within goal on 12/21  Will cont to monitor Vitals:   11/14/16 0450 11/14/16 1406  BP: (!) 146/69 (!) 155/57  Pulse: (!) 102 87  Resp: 20 18  Temp: 98 F (36.7 C) 97.7 F (36.5 C)   9. 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  11. Hiccups:   Baclofen increased to 10mg  QID monitor for sedation, ?resolved, weaned to 5mg  on 12/13, decreased to BID on 12/18, d/ced 12/21 12. GERD: On Zantac bid. 13. Pre-diabtes: Hgb A1c- 5.9. CBG (last 3)   Recent Labs  11/13/16 2145 11/14/16 0625 11/14/16 1138  GLUCAP 131* 122* 129*   Stable 12/21, will d/c CBGs 14. Mild hyponatremia: Resolved  K+ 3.5 on 12/20 after supplementation  Will cont to monitor 15. AKI  Dehydration +/- ACE  Started IVF at night, ACE d/ced  Cr 0.91 on 12/20 (stable) 16. Hypernatremia: Resolved  Na+ 144 on 12/158  Cont to monitor   LOS (Days) 15 A FACE TO FACE EVALUATION WAS PERFORMED  Yareni Creps Lorie Phenix 11/14/2016 3:36 PM

## 2016-11-14 NOTE — Progress Notes (Signed)
Physical Therapy Weekly Progress Note  Patient Details  Name: [name] Cook MRN: [id number] Date of Birth: Oct 27, 1941  Beginning of progress report period: November 08, 2016 End of progress report period: November 14, 2016  Today's Date: 11/14/2016 PT Individual Time: 0830-0930 PT Individual Time Calculation (min): 60 min   Pt finishing breakfast with RN and NT in the room. Cues for checking L cheek for pocketing and use of suction for oral hygiene. RN administered pain medication. Pt required max assist for rolling in the bed to don pants with cues for hand placement and technique. Max assist to get to EOB with pt using bed rail for assist - pt initiating some scooting to get to EOB but requires up to total assist to functionally reposition and gain balance. Mod to max assist for sitting balance with mod verbal cues for posture while PT donned prosthesis. Total assist for L lateral lean to place slideboard for preparation for transfer and to return to upright position. Pt initiating scooting during transfer (pt = 30%) with max +2 for transfer into w/c and total +2 for repositioning hips in w/c. Pt request to brush teeth - used oral suction kit to complete with cues for safety. Seated in w/c completed neuro re-ed for functional reaching, postural control re-training with mirror for visual feedback, and dynamic sitting balance. Pt able to complete with min to mod assist and demonstrating improved ability to activate forward weightshift and attention to the L during task. Giv Mohr sling applied for positioning and pillow placed under LUE for edema control and support. Safety belt donned.    Patient has met 2 of 4 short term goals.  Pt continues to make slow functional progress with mobility due to impairments listed below. Pt continues to require max +2 assist for all transfers and dependent for w/c mobility (pt using TIS at this time for safety, pressure relief, and postural alignment/postiioning).  Plan is to d/c to SNF due to level of care needed upon d/c.   Patient continues to demonstrate the following deficits muscle weakness, muscle joint tightness and muscle paralysis, decreased cardiorespiratoy endurance, impaired timing and sequencing, abnormal tone, decreased coordination and decreased motor planning, decreased midline orientation and decreased attention to left, decreased initiation, decreased attention, decreased awareness, decreased problem solving, decreased safety awareness and decreased memory and decreased sitting balance, decreased standing balance, decreased postural control, hemiplegia and decreased balance strategies and therefore will continue to benefit from skilled PT intervention to increase functional independence with mobility.  Patient progressing toward long term goals..  Plan of care revisions: downgraded transfer goal to max assist of 1 due to slow progress. .  PT Short Term Goals Week 2:  PT Short Term Goal 1 (Week 2): Patient will roll in bed with mod A to L and max A to R. PT Short Term Goal 1 - Progress (Week 2): Not met PT Short Term Goal 2 (Week 2): Patient will perform transfers with mod A x 2 persons. PT Short Term Goal 2 - Progress (Week 2): Not met PT Short Term Goal 3 (Week 2): Patient will maintain static sitting balance with mod A x 1 min. PT Short Term Goal 3 - Progress (Week 2): Met PT Short Term Goal 4 (Week 2): Patient will sustain attention to functional task x 30 sec with mod cues. 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   See Function Navigator for Current Functional  Status.  Therapy/Group: Individual Therapy  Canary Brim Ivory Broad, PT, DPT  11/14/2016, 9:55 AM

## 2016-11-14 NOTE — Plan of Care (Signed)
Problem: RH SAFETY Goal: RH STG DEMO UNDERSTANDING HOME SAFETY PRECAUTIONS Patient and caregiver able to demonstrate understanding of home safety precautions with minimal assistance.  Outcome: Not Applicable Date Met: 11/14/16 Patient will be discharging to snf   

## 2016-11-14 NOTE — Progress Notes (Signed)
Speech Language Pathology Daily Session Note  Patient Details  Name: [name] Cook MRN: [id number] Date of Birth: Oct 07, 1941  Today's Date: 11/14/2016 SLP Individual Time: 1315-1400 SLP Individual Time Calculation (min): 45 min   Short Term Goals: Week 2: SLP Short Term Goal 1 (Week 2): Pt will attend to basic, familiar tasks for ~5 minutes intervals with Mod A verbal cues for redirection.  SLP Short Term Goal 2 (Week 2): Pt will attend to left field of environment during functional tasks with Mod A verbal cues.  SLP Short Term Goal 3 (Week 2): Pt will consume current diet without overt s/s of aspiration with Min A verbal cues for use of swallowing compensatory strategies.  SLP Short Term Goal 4 (Week 2): Pt will consume trials of nectar thick liquids without overt s/s of aspiration with Mod A verbal cues .  SLP Short Term Goal 5 (Week 2): Pt will utilize speech intelligibility strategies at the pharse level with Max A verbal cues to achieve 75% intelligibility.   Skilled Therapeutic Interventions: Skilled treatment session focused on cognition goals. SLP facilitated session by providing Mod A verbal cues for attention to basic, familiar tasks for ~5 minutes. Pt internally distracted by pant leg, pain to left lower extremity. Pt able to attend to left field of environment with SLP seated to left of environment during functional tasks. During task itself, pt required Mod A verbal cues for attention to items in task that were on the left. Pt was intelligible at the sentence level with Min A verbal cues for ~90%. Pt left in wheelchair with safety belt donned and all needs within reach.   Function:  Eating Eating   Modified Consistency Diet: Yes Eating Assist Level: Supervision or verbal cues;Helper checks for pocketed food;Helper scoops food on utensil;Helper brings food to mouth;Help with picking up utensils;Help managing cup/glass;Helper performs IV, parenteral or tube feed;Helper feeds  patient;More than reasonable amount of time   Eating Set Up Assist For: Opening containers Helper Scoops Food on Utensil: Occasionally Helper Brings Food to Mouth: Occasionally   Cognition Comprehension Comprehension assist level: Follows basic conversation/direction with extra time/assistive device  Expression   Expression assist level: Expresses basic needs/ideas: With extra time/assistive device  Social Interaction Social Interaction assist level: Interacts appropriately 50 - 74% of the time - May be physically or verbally inappropriate.  Problem Solving Problem solving assist level: Solves basic 25 - 49% of the time - needs direction more than half the time to initiate, plan or complete simple activities  Memory Memory assist level: Recognizes or recalls 25 - 49% of the time/requires cueing 50 - 75% of the time    Pain Pain Assessment Pain Assessment: No/denies pain  Therapy/Group: Individual Therapy  Marshun Duva 11/14/2016, 2:18 PM

## 2016-11-14 NOTE — Progress Notes (Signed)
Social Work Patient ID: [name] Cook, male   DOB: 18-Sep-1941, 75 y.o.   MRN: [id number]  Met with pt, wife and son-Lumir to discuss team conference progression toward his goals. Wife and son were able to Observe pt in therapies and could see his progress. Aware diet was advanced to Dys 2 still on honey thick liquids. Speech to do MBS early next week to see if can increase his liquids, this would enable him to get off the Nightly IV's. Will begin NH process but await MBS. Family wants to pursue [facility] will make contact with regarding bed availability.

## 2016-11-14 NOTE — Progress Notes (Signed)
Occupational Therapy Session Note  Patient Details  Name: Rajesh Dhaliwal MRN: [id number] Date of Birth: 14-Jul-1941  Today's Date: 11/14/2016 OT Individual Time: 1133-1206 OT Individual Time Calculation (min): 33 min     Short Term Goals: Week 2:  OT Short Term Goal 1 (Week 2): Pt will maintan sitting balance with min A during simple ADL task OT Short Term Goal 2 (Week 2): Pt will transfer with mod A +2 with LRAD bed/ chair in prep for ADL task OT Short Term Goal 3 (Week 2): Pt will demonstrate safe management of left UE with bed mobility with mod cuing  OT Short Term Goal 4 (Week 2): Pt will roll for LB clothing management with mod A with bed rails   Skilled Therapeutic Interventions/Progress Updates:    Treatment session with focus on dressing.  Pt received in bed having completed bathing with previous OT but had no clean clothes during her session.  Donned pants at bed level with pt able to thread RLE without prosthesis donned, educated on use of bed rails and body positioning to increase assistance with rolling to allow therapist to assist with pulling pants over hips.  Max assist sidelying to sitting.  Pt with pushing tendencies requiring cues to weight shift to Rt to minimize lean/pushing to Lt to increase success with donning shirt.  Donned prosthesis seated EOB and engaged in sit > stand +2 to ensure proper fit of prosthesis.  Completed slide board transfer with max assist +2 with max multimodal cues for sequencing and body positioning.  Pt left tilted back in tilt in space with quick release belt donned and wife and son present.  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   Therapy/Group: Individual Therapy  [name] 11/14/2016, 3:41 PM

## 2016-11-14 NOTE — Progress Notes (Signed)
Speech Language Pathology Weekly Progress and Session Note  Patient Details  Name: [name] Cook MRN: [id number] Date of Birth: 04/21/1941  Beginning of progress report period: November 07, 2016  End of progress report period: November 14, 2016   Today's Date: 11/14/2016 SLP Individual Time: 1449-1530 SLP Individual Time Calculation (min): 41 min   Short Term Goals: Week 2: SLP Short Term Goal 1 (Week 2): Pt will attend to basic, familiar tasks for ~5 minutes intervals with Mod A verbal cues for redirection.  SLP Short Term Goal 1 - Progress (Week 2): Progressing toward goal SLP Short Term Goal 2 (Week 2): Pt will attend to left field of environment during functional tasks with Mod A verbal cues.  SLP Short Term Goal 2 - Progress (Week 2): Met SLP Short Term Goal 3 (Week 2): Pt will consume current diet without overt s/s of aspiration with Min A verbal cues for use of swallowing compensatory strategies.  SLP Short Term Goal 3 - Progress (Week 2): Met SLP Short Term Goal 4 (Week 2): Pt will consume trials of nectar thick liquids without overt s/s of aspiration with Mod A verbal cues .  SLP Short Term Goal 4 - Progress (Week 2): Other (comment) (met and revised ) SLP Short Term Goal 5 (Week 2): Pt will utilize speech intelligibility strategies at the pharse level with Max A verbal cues to achieve 75% intelligibility.  SLP Short Term Goal 5 - Progress (Week 2): Discontinued (comment) (to allow focus on cognitive goals. )    New Short Term Goals: Week 3: SLP Short Term Goal 1 (Week 3): Pt will attend to basic, familiar tasks for ~5 minutes intervals with Mod A verbal cues for redirection.  SLP Short Term Goal 2 (Week 3): Pt will attend to left field of environment during functional tasks with Min A verbal cues.  SLP Short Term Goal 3 (Week 3): Pt will consume current diet without overt s/s of aspiration with supervision verbal cues for use of swallowing compensatory strategies.  SLP Short  Term Goal 4 (Week 3): Pt will consume trials of thin liquids without overt s/s of aspiration with supervision verbal cues .   Weekly Progress Updates: Pt has made slow functional gains this reporting period and has met 3 out of 4 targeted goals.  Pt's diet has been advanced to dys 2 textures with ongoing need for honey thick liquids due to oral holding and overt s/s of aspiration with thin liquids.  Pt needs overall min assist for use of swallowing precautions.  Plan for repeat MBS early next week if pt continues to demonstrate improved attention to boluses.  Pt needs mod-max assist during basic tasks due to significant cognitive impairments but has demonstrated modest improvements in his ability to sustain his attention to tasks with mod assist cues for redirection.  Pt would continue to benefit from skilled ST while inpatient in order to maximize functional independence and reduce burden of care prior to discharge.  Pt and family education is ongoing.     Intensity: Minumum of 1-2 x/day, 30 to 90 minutes Frequency: 3 to 5 out of 7 days Duration/Length of Stay: 3 weeks Treatment/Interventions: Cognitive remediation/compensation;Dysphagia/aspiration precaution training;Internal/external aids;Functional tasks;Patient/family education;Therapeutic Exercise;Therapeutic Activities;Environmental controls   Daily Session  Skilled Therapeutic Interventions: Pt was seen for skilled ST targeting goals for dysphagia and cognition.  SLP facilitated the session with trials of thin liquids following oral care per the water protocol.  Pt needed min assist verbal cues to initiate  swallow due to oral holding and demonstrated immediate coughing x2-3.  Pt needed mod-max assist multimodal cues for sequencing, initiation, and attention to the left when completing oral care.  Pt was able to complete basic symbol cancellation tasks for 100% accuracy with mod assist verbal cues for redirection to task and to locate targets on  his left.  Pt was returned to room and left in wheelchair with call bell within reach and quick release belt donned for safety.  Goals updated on this date to reflect current progress and plan of care.       Function:   Eating Eating   Modified Consistency Diet: Yes Eating Assist Level: Supervision or verbal cues   Eating Set Up Assist For: Opening containers Helper Scoops Food on Utensil: Occasionally Helper Brings Food to Mouth: Occasionally   Cognition Comprehension Comprehension assist level: Follows basic conversation/direction with extra time/assistive device  Expression   Expression assist level: Expresses basic needs/ideas: With extra time/assistive device  Social Interaction Social Interaction assist level: Interacts appropriately 50 - 74% of the time - May be physically or verbally inappropriate.  Problem Solving Problem solving assist level: Solves basic 25 - 49% of the time - needs direction more than half the time to initiate, plan or complete simple activities  Memory Memory assist level: Recognizes or recalls 25 - 49% of the time/requires cueing 50 - 75% of the time   General    Pain Pain Assessment Pain Assessment: No/denies pain  Therapy/Group: Individual Therapy  Lev Cervone, Selinda Orion 11/14/2016, 4:50 PM

## 2016-11-14 NOTE — Progress Notes (Signed)
Occupational Therapy Session Note  Patient Details  Name: [name] Cook MRN: [id number] Date of Birth: [birth year]  Today's Date: 11/14/2016 OT Individual Time: 1020-1100 OT Individual Time Calculation (min): 40 min     Short Term Goals:Week 2:  OT Short Term Goal 1 (Week 2): Pt will maintan sitting balance with min A during simple ADL task OT Short Term Goal 2 (Week 2): Pt will transfer with mod A +2 with LRAD bed/ chair in prep for ADL task OT Short Term Goal 3 (Week 2): Pt will demonstrate safe management of left UE with bed mobility with mod cuing  OT Short Term Goal 4 (Week 2): Pt will roll for LB clothing management with mod A with bed rails   Skilled Therapeutic Interventions/Progress Updates:    Pt seen this session to facilitate sitting balance, transfers, bed mobility, memory, problem solving with basic self care. Pt received in w/c. Wife stated he did not have any clean clothing and pt's son was bringing them up later.  Pt agreeable to bathing in bed. Completed slide board to bed with +2 max A to pt's L side. On EOB, pt worked on static sitting. As pt was falling to L, asked pt how he was leaning and to "fix it". He was able to self correct and hold midline briefly. Repeated questioning as he was leaning back and pt able to self correct momentarily.  +2 to move into supine.  For rolling to R, cued pt to hold L arm and use head/ trunk to lead to the R to roll. Pt able to initiate movement well. Rolled several times to doff old clothing, clean up from BM, and donning new brief.  Pt actively moved L arm slightly 1x during bathing.  Pt resting in bed with blanket over him and spouse in room with pt. Bed alarm set.   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   Therapy/Group: Individual Therapy  [name] 11/14/2016, 12:26 PM

## 2016-11-14 NOTE — Progress Notes (Signed)
Social Work [name], LCSW Social Worker Signed   Patient Care Conference Date of Service: 11/13/2016  3:49 PM      Hide copied text Hover for attribution information Inpatient RehabilitationTeam Conference and Plan of Care Update Date: 11/13/2016   Time: 2:20 PM      Patient Name: [name] Cook      Medical Record Number: [id number]  Date of Birth: 1941-02-03 Sex: Male         Room/Bed: 4W22C/4W22C-01 Payor Info: Payor: MEDICARE / Plan: MEDICARE PART A AND B / Product Type: *No Product type* /     Admitting Diagnosis: R CVA  Admit Date/Time:  10/30/2016  6:32 PM Admission Comments: No comment available    Primary Diagnosis:  <principal problem not specified> Principal Problem: <principal problem not specified>       Patient Active Problem List    Diagnosis Date Noted  . Dysphagia    . Hyponatremia    . AKI (acute kidney injury) ([place])    . Labile blood pressure    . Hypernatremia    . Hypertensive urgency    . 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 Occlusion and stenosis of carotid artery    . Right basal ganglia embolic stroke ([place]) [id number]  . Cerebral infarction due to stenosis of right carotid artery ([place])    . Gastroesophageal reflux disease without esophagitis    . S/P BKA (below knee amputation) unilateral, right ([place])    . Benign essential HTN    . Hiccups    . 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 Cerebral infarction ([place])    . Hypertensive crisis    . Acute lower UTI    . Stenosis of carotid artery    . Prediabetes    . CVA (cerebral vascular accident) ([place]) 10/28/2016  . Bilateral carotid artery stenosis    . Left-sided weakness 10/27/2016  . Ischemic stroke ([place]) 10/27/2016  . Acute ischemic stroke ([place]) 10/27/2016  . NSTEMI (non-ST elevated myocardial infarction) ([place]) 10/12/2014  . Hx of right BKA ([place]) 10/12/2014  . 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 Atherosclerosis of native arteries of extremity with intermittent claudication ([place]) 12/08/2013  . Diabetes mellitus with circulatory complication ([place])    . Essential hypertension, benign 07/17/2013  . Hypokalemia 07/17/2013  . Non-ST elevation myocardial infarction (NSTEMI), initial episode of care ([place]) 07/16/2013  . GERD (gastroesophageal reflux disease) 03/15/2013  . Peripheral vascular disease ([place]) 03/03/2013  . CAROTID BRUIT 02/06/2011  . Dyslipidemia 10/31/2009  . Essential hypertension 10/31/2009  . Coronary atherosclerosis 10/31/2009      Expected Discharge Date: Expected Discharge Date: 11/20/16   Team Members Present: Physician leading conference: Dr. Delice Lesch Social Worker Present: Ovidio Kin, LCSW Nurse Present: Heather Roberts, RN PT Present: Jorge Mandril, PT OT Present: Other (comment) Grayland Ormond Doe-OT) SLP Present: Windell Moulding, SLP       Current Status/Progress Goal Weekly Team Focus  Medical   Left hemiplegia, dysarthria, dysphagia secondary to right basal ganglia infarct with history of right BKA  Improve awareness, cognitive, safety, BP, AKI  See above   Bowel/Bladder   incontinent of bowel and bladder; uses condom cath. LBM 11/12/16. Has small smears as well.   Maintain time toileting, usage of condom cath  monitor for changes in B/B status   Swallow/Nutrition/ Hydration   IV  fluids 7p-7a; poor appetite on honey thick fluids  min assist with least restrictive diet   trials of advanced textures to continue working towards repeat objective assessment    ADL's   dependent  Overall Mod A  attention, awareness, NMR, pt/family ed, modified bathing/dressing, sitting balance, functional transfers   Mobility   maxi move  mod A  functional mobiliy, attention, awareness, patient/family education, balance   Communication             Safety/Cognition/ Behavioral Observations follows commands  mod assist   continue to  address basic cognition    Pain   no pain   maintain pain level under 2 on 0-10 scale  manage pain as needed with pharmacological and non-pharmacological measures   Skin   no skin issues, hx right BKA  pt will maintain skin integrity  monitor for skin breakdown       *See Care Plan and progress notes for long and short-term goals.   Barriers to Discharge: Mobility, transfers, safety, awareness, HTN, AKI   Possible Resolutions to Barriers:  Therapies, follow labs, IVF, optimize BP meds   Discharge Planning/Teaching Needs:  per CN / conference pt. is going for NHP [facility]      Team Discussion:  Making slow progress with attention and carry over. Still requiring plus 2 assist-total assist. Upgraded to Dys 2 honey thick diet, MBS next week. Still pushes to the left. Still receiving IV's at night for hydration. Family not able to provide this amount of care at home. Begin looking for NH bed  Revisions to Treatment Plan:  NHP    Continued Need for Acute Rehabilitation Level of Care: The patient requires daily medical management by a physician with specialized training in physical medicine and rehabilitation for the following conditions: Daily direction of a multidisciplinary physical rehabilitation program to ensure safe treatment while eliciting the highest outcome that is of practical value to the patient.: Yes Daily medical management of patient stability for increased activity during participation in an intensive rehabilitation regime.: Yes Daily analysis of laboratory values and/or radiology reports with any subsequent need for medication adjustment of medical intervention for : Neurological problems;Blood pressure problems;Other   [name] 11/14/2016, 8:54 AM      [name], LCSW Social Worker Signed   Patient Care Conference Date of Service: 11/07/2016  9:08 AM      Hide copied text Hover for attribution information Inpatient RehabilitationTeam Conference and Plan  of Care Update Date: 11/06/2016   Time: 2:20 PM      Patient Name: [name] Cook      Medical Record Number: [id number]  Date of Birth: Mar 27, 1941 Sex: Male         Room/Bed: 4W22C/4W22C-01 Payor Info: Payor: MEDICARE / Plan: MEDICARE PART A AND B / Product Type: *No Product type* /     Admitting Diagnosis: R CVA  Admit Date/Time:  10/30/2016  6:32 PM Admission Comments: No comment available    Primary Diagnosis:  <principal problem not specified> Principal Problem: <principal problem not specified>       Patient Active Problem List    Diagnosis Date Noted  . Hypernatremia    . Hypertensive urgency    . 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 Occlusion and stenosis of carotid artery    . Right basal ganglia embolic stroke ([place]) [id number]  . Cerebral infarction due to stenosis of right carotid artery ([place])    .  Gastroesophageal reflux disease without esophagitis    . S/P BKA (below knee amputation) unilateral, right ([place])    . Benign essential HTN    . Hiccups    . 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 Cerebral infarction ([place])    . Hypertensive crisis    . Acute lower UTI    . Stenosis of carotid artery    . Prediabetes    . CVA (cerebral vascular accident) ([place]) 10/28/2016  . Bilateral carotid artery stenosis    . Left-sided weakness 10/27/2016  . Ischemic stroke ([place]) 10/27/2016  . Acute ischemic stroke ([place]) 10/27/2016  . NSTEMI (non-ST elevated myocardial infarction) ([place]) 10/12/2014  . Hx of right BKA ([place]) 10/12/2014  . 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 Atherosclerosis of native arteries of extremity with intermittent claudication ([place]) 12/08/2013  . Diabetes mellitus with circulatory complication ([place])    . Essential hypertension, benign 07/17/2013  . Hypokalemia 07/17/2013  . Non-ST elevation myocardial infarction (NSTEMI), initial  episode of care ([place]) 07/16/2013  . GERD (gastroesophageal reflux disease) 03/15/2013  . Peripheral vascular disease ([place]) 03/03/2013  . CAROTID BRUIT 02/06/2011  . Dyslipidemia 10/31/2009  . Essential hypertension 10/31/2009  . Coronary atherosclerosis 10/31/2009      Expected Discharge Date: Expected Discharge Date: 11/20/16   Team Members Present: Physician leading conference: Dr. Delice Lesch Social Worker Present: Ovidio Kin, LCSW Nurse Present: Dorthula Nettles, RN PT Present: Carney Living, PT OT Present: Other (comment) Grayland Ormond Doe-OT) SLP Present: Windell Moulding, SLP PPS Coordinator present : Daiva Nakayama, RN, CRRN       Current Status/Progress Goal Weekly Team Focus  Medical   Left hemiplegia, dysarthria, dysphagia secondary to right basal ganglia infarct with history of right BKA  Improve awareness, attention HTN, electrolyte abnormalities  See above   Bowel/Bladder   incont of B/B. LBM 11/04/16  time toileting with mod assist  monitor for changes in B/B status   Swallow/Nutrition/ Hydration   Dys 1, honey thick liquids   min assist with least restrictive diet   trials of advanced textures per improved attention to boluses   ADL's   Max A-Total A+2  Overall Mod A  attention, awareness, NMR, pt/family ed, modified bathing/dressing sitting balance, functional transfers   Mobility   max to total +2  min-mod A overall w/c level  functional transfers, L attention, L NMR, sitting balance, standing tolerance in standing frame, attention, awareness, pt/fam education   Communication   dysarthric   mod assist   education and carryover of compensatory strategies per improved mentation    Safety/Cognition/ Behavioral Observations max assist   mod assist   basic cognition, visual scanning to the left of midline    Pain   pt denies pain  <2  manage pain with pharmacological and non-pharmacological measures   Skin   No current skin issues; Hx R BKA  pt will maintain skin integrity   monitor for signs of skin breakdown       *See Care Plan and progress notes for long and short-term goals.   Barriers to Discharge: Mobility, transfers, safety, awareness, electrolyte abnormalities, hypertensive urgency   Possible Resolutions to Barriers:  Therapies, follow labs, optimize BP meds   Discharge Planning/Teaching Needs:  Family wants to take him home but will need a people who can provide physical care due to wife is not able too. Will work on a realistic discharge  plan.      Team Discussion:  Patient has severe deficits-currently total assist plus 2 with sitting balance and trunk control. Goals mod-assist wheelchair level. No awareness or attention to his right side. Dys 2 honey thick liquids, MD starting IV fluids. Incont B & B. Would require much care by wife and family question if they can provide this level of care.  Revisions to Treatment Plan:  Home versus NHP    Continued Need for Acute Rehabilitation Level of Care: The patient requires daily medical management by a physician with specialized training in physical medicine and rehabilitation for the following conditions: Daily direction of a multidisciplinary physical rehabilitation program to ensure safe treatment while eliciting the highest outcome that is of practical value to the patient.: Yes Daily medical management of patient stability for increased activity during participation in an intensive rehabilitation regime.: Yes Daily analysis of laboratory values and/or radiology reports with any subsequent need for medication adjustment of medical intervention for : Post surgical problems;Neurological problems;Blood pressure problems   [name] 11/07/2016, 9:08 AM       Patient ID: Lamont Dowdy, male   DOB: [birth year], 75 y.o.   MRN: [id number]

## 2016-11-15 ENCOUNTER — Inpatient Hospital Stay (HOSPITAL_COMMUNITY): Payer: Medicare Other | Admitting: Occupational Therapy

## 2016-11-15 ENCOUNTER — Inpatient Hospital Stay (HOSPITAL_COMMUNITY): Payer: Medicare Other | Admitting: Speech Pathology

## 2016-11-15 ENCOUNTER — Inpatient Hospital Stay (HOSPITAL_COMMUNITY): Payer: Medicare Other

## 2016-11-15 DIAGNOSIS — K5901 Slow transit constipation: Secondary | ICD-10-CM

## 2016-11-15 MED ORDER — POLYETHYLENE GLYCOL 3350 17 G PO PACK
17.0000 g | PACK | Freq: Every day | ORAL | Status: DC
  Administered 2016-11-16 – 2016-11-18 (×3): 17 g via ORAL
  Filled 2016-11-15 (×4): qty 1

## 2016-11-15 NOTE — Progress Notes (Signed)
PHYSICAL MEDICINE & REHABILITATION     PROGRESS NOTE    Subjective/Complaints: Pt seen laying in bed this AM.  He slept well, but states that he has to strain to have a BM.    ROS: Denies CP, SOB, nausea, vomiting, diarrhea  Objective: Vital Signs: Blood pressure (!) 148/52, pulse 88, temperature 98 F (36.7 C), temperature source Axillary, resp. rate 18, height 5\' 8"  (1.727 m), weight 85.9 kg (189 lb 6.4 oz), SpO2 98 %. No results found.  Recent Labs  11/13/16 0637  WBC 6.8  HGB 12.0*  HCT 36.4*  PLT 323    Recent Labs  11/13/16 0637  NA 142  K 3.5  CL 113*  GLUCOSE 131*  BUN 20  CREATININE 0.91  CALCIUM 8.4*   CBG (last 3)   Recent Labs  11/13/16 2145 11/14/16 0625 11/14/16 1138  GLUCAP 131* 122* 129*    Wt Readings from Last 3 Encounters:  11/13/16 85.9 kg (189 lb 6.4 oz)  10/28/16 92 kg (202 lb 12.8 oz)  09/20/16 95.8 kg (211 lb 4 oz)    Physical Exam:  Constitutional: NAD. Vital signs reviewed.   HENT: Normocephalic and atraumatic.  Eyes: EOM are normal. No discharge.  Cardiovascular: RRR, +murmur. No JVD. Respiratory: clear. Unlabored.  GI: Soft. Bowel sounds are normal. Musculoskeletal: He exhibits no edema. He exhibits no tenderness.  RLE BKA healed Neurological: He is alert.  Alert and oriented x3 with cues. Dysarthria Left facial weakness (stable) Motor: LUE 0/5. mAS 1/4 with finger flexors (stable) LLE: HF 1/5, HE 2+/5, KE 1+/5, ADF 1+/5 (stable) RUE: 5/5 proximal to distal RLE: HF 5/5. Skin: Skin is warm and dry and intact.  Psychiatric: Flat.   Assessment/Plan: 1. Functional deficits secondary to right basal ganglia infarct which require 3+ hours per day of interdisciplinary therapy in a comprehensive inpatient rehab setting. Physiatrist is providing close team supervision and 24 hour management of active medical problems listed below. Physiatrist and rehab team continue to assess barriers to discharge/monitor patient  progress toward functional and medical goals.  Function:  Bathing Bathing position   Position: Bed  Bathing parts Body parts bathed by patient: Left arm, Chest, Abdomen, Front perineal area, Right upper leg, Left upper leg Body parts bathed by helper: Left lower leg, Buttocks, Right arm, Back  Bathing assist Assist Level: 2 helpers      Upper Body Dressing/Undressing Upper body dressing   What is the patient wearing?: Pull over shirt/dress     Pull over shirt/dress - Perfomed by patient: Thread/unthread right sleeve, Put head through opening Pull over shirt/dress - Perfomed by helper: Pull shirt over trunk, Thread/unthread left sleeve        Upper body assist Assist Level:  (Max assist for sitting balance, pt completing 2/4 steps)      Lower Body Dressing/Undressing Lower body dressing   What is the patient wearing?: Pants, AFO, Socks, Shoes     Pants- Performed by patient: Thread/unthread right pants leg Pants- Performed by helper: Thread/unthread right pants leg, Pull pants up/down     Socks - Performed by patient: Don/doff right sock (stump sock) Socks - Performed by helper: Don/doff left sock   Shoes - Performed by helper: Don/doff left shoe   AFO - Performed by helper: Don/doff right AFO (prosthesis)      Lower body assist Assist for lower body dressing:  (Total assist)      Toileting Toileting Toileting activity did not occur: No continent bowel/bladder  event   Toileting steps completed by helper: Adjust clothing prior to toileting, Performs perineal hygiene, Adjust clothing after toileting Toileting Assistive Devices: Toilet aid  Toileting assist Assist level: Two helpers   Transfers Chair/bed transfer   Chair/bed transfer method: Lateral scoot Chair/bed transfer assist level: 2 helpers Chair/bed transfer assistive device: Sliding board, Armrests, Prosthesis Mechanical lift: Maximove   Locomotion Ambulation Ambulation activity did not occur:  Safety/medical concerns         Wheelchair   Type: Manual Max wheelchair distance: 30' Assist Level: Maximal assistance (Pt 25 - 49%)  Cognition Comprehension Comprehension assist level: Follows basic conversation/direction with extra time/assistive device  Expression Expression assist level: Expresses basic needs/ideas: With extra time/assistive device  Social Interaction Social Interaction assist level: Interacts appropriately 50 - 74% of the time - May be physically or verbally inappropriate.  Problem Solving Problem solving assist level: Solves basic 25 - 49% of the time - needs direction more than half the time to initiate, plan or complete simple activities  Memory Memory assist level: Recognizes or recalls 25 - 49% of the time/requires cueing 50 - 75% of the time   Medical Problem List and Plan: 1.  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  DVT Prophylaxis/Anticoagulation: Pharmaceutical: Lovenox 3. Pain Management: tylenol prn. Continue to educate patient family on LUE positioning.  4. Mood: LCSW to follow for evaluation and support.  5. Neuropsych: This patient is not fully capable of making decisions on his own behalf. 6. Skin/Wound Care: routine pressure relief measures 7. Fluids/Electrolytes/Nutrition: Monitor I/O.   D1 Honey, advanced to D2 Honey  Monitor hydration on honey liquids- concerns of adequate intake with ongoing hiccups and coughing due to difficulty with secretions.  8. HTN: Monitor BP. Goal of BP 130-150--avoid hypotension  Lisinopril 2.5 started 12/11, increased to 7.5 on 12/12, increased to 10 on 12/13, d/ced on 12/16.    Norvasc 5mg  started 12/12, increased to 10 on 12/16  Hydralazine 25 TID started 12/16, increased to 50 on 12/18  Within goal range on 12/22  Will cont to monitor Vitals:    11/14/16 2115 11/15/16 0446  BP: (!) 150/52 (!) 148/52  Pulse: 86 88  Resp:  18  Temp:  98 F (36.7 C)   9. 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  11. Hiccups:   Baclofen increased to 10mg  QID monitor for sedation, ?resolved, weaned to 5mg  on 12/13, decreased to BID on 12/18, d/ced 12/21 12. GERD: On Zantac bid. 13. Pre-diabtes: Hgb A1c- 5.9. CBG (last 3)   Recent Labs  11/13/16 2145 11/14/16 0625 11/14/16 1138  GLUCAP 131* 122* 129*   Stable 12/21, d/ced CBGs 14. Mild hyponatremia: Resolved  K+ 3.5 on 12/20 after supplementation  Will cont to monitor 15. AKI  Dehydration +/- ACE  Started IVF at night, ACE d/ced  Cr 0.91 on 12/20 (stable) 16. Hypernatremia: Resolved  Na+ 144 on 12/158  Cont to monitor 17. Constipation  Increased reg on  12/22   LOS (Days) 16 A FACE TO FACE EVALUATION WAS PERFORMED  Lyndall Windt Lorie Phenix 11/15/2016 11:01 AM

## 2016-11-15 NOTE — Progress Notes (Signed)
Physical Therapy Session Note  Patient Details  Name: [name] Cook MRN: [id number] Date of Birth: 21-Mar-1941  Today's Date: 11/15/2016 PT Individual Time: 0835-0950 PT Individual Time Calculation (min): 75 min    Short Term Goals: Week 2:  PT Short Term Goal 1 (Week 2): Patient will roll in bed with mod A to L and max A to R. PT Short Term Goal 1 - Progress (Week 2): Not met PT Short Term Goal 2 (Week 2): Patient will perform transfers with mod A x 2 persons. PT Short Term Goal 2 - Progress (Week 2): Not met PT Short Term Goal 3 (Week 2): Patient will maintain static sitting balance with mod A x 1 min. PT Short Term Goal 3 - Progress (Week 2): Met PT Short Term Goal 4 (Week 2): Patient will sustain attention to functional task x 30 sec with mod cues. PT Short Term Goal 4 - Progress (Week 2): Met Week 3:  PT Short Term Goal 1 (Week 3): = LTGs due to SNF placement  Skilled Therapeutic Interventions/Progress Updates:    Session focused on rolling in bed to don pants to prepare for OOB with max assist and verbal cues for technique and hand placement, supine -> sit with HOB elevated with pt initiating scooting hips forward but required max assist overall with cues for technique and attention, neuro re-ed for slideboard transfer training with mod to max +2 (pt demonstrating improved scooting technique once on the board) into standard manual w/c with assist for weighshifting and balance, w/c mobility training in standard manual w/c with mod to max assist and max cues for attention to L to negotiate obstacles, and use of tilt table for neuro re-ed for postural control retraining, weightbearing and weighshifting (x 10 min at 85 degrees). While on tilt table focused on maintaining midline orientation, upright posture, and weighshifting through LE's. Used maxi move to transfer w/c -> tilt table -> tilt in space w/c with +2 assist for sling placement, transfer and repositioning in w/c. End of session  set up in TIS w/c with family present and NT present to assist with breakfast.    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 Braces or Orthoses: Sling Other Brace/Splint: sling for LUE after fall Restrictions Weight Bearing Restrictions: No  Pain:  Denies pain.    See Function Navigator for Current Functional Status.   Therapy/Group: Individual Therapy  Canary Brim Ivory Broad, PT, DPT  11/15/2016, 10:00 AM

## 2016-11-15 NOTE — Progress Notes (Signed)
Speech Language Pathology Daily Session Note  Patient Details  Name: [name] Cook MRN: [id number] Date of Birth: [birth year]  Today's Date: 11/15/2016 SLP Individual Time: 1255-1355 SLP Individual Time Calculation (min): 60 min   Short Term Goals: Week 3: SLP Short Term Goal 1 (Week 3): Pt will attend to basic, familiar tasks for ~5 minutes intervals with Mod A verbal cues for redirection.  SLP Short Term Goal 2 (Week 3): Pt will attend to left field of environment during functional tasks with Min A verbal cues.  SLP Short Term Goal 3 (Week 3): Pt will consume current diet without overt s/s of aspiration with supervision verbal cues for use of swallowing compensatory strategies.  SLP Short Term Goal 4 (Week 3): Pt will consume trials of thin liquids without overt s/s of aspiration with supervision verbal cues .   Skilled Therapeutic Interventions: Skilled treatment session focused on dysphagia and cognitive goals. Upon arrival, patient was consuming his lunch tray of Dys. 2 textures and honey-thick liquids with NT present. NT reported patient had a "choking episode" due to dry chicken prior to SLP arrival. SLP facilitated the session by providing Mod A verbal cues for use of swallowing precautions to clear residuals from the oral cavity post swallow and to decrease oral holding of liquids and Dys. 1 textures. Patient demonstrated a wet vocal quality and intermittent coughing throughout remainder of session, suspect due to decreased attention to secretions. Patient self-fed meal and was able to sustain his attention to meal for 1 minute intervals with min verbal cues for redirection.  Patient was even noted to alternate his attention between brief conversation with therapist and meal with mod assist verbal cues for redirection.  Patient also participated in a basic but novel card task with focus on basic problem solving, attention and scanning to the left. Patient required Mod A verbal cues to  complete task. Patient left upright in wheelchair with quick release belt in place and all needs within reach.  Continue with current plan of care.     Function:  Eating Eating   Modified Consistency Diet: Yes Eating Assist Level: Supervision or verbal cues;Set up assist for;Helper checks for pocketed food   Eating Set Up Assist For: Opening containers       Cognition Comprehension Comprehension assist level: Follows basic conversation/direction with extra time/assistive device  Expression   Expression assist level: Expresses basic needs/ideas: With extra time/assistive device  Social Interaction Social Interaction assist level: Interacts appropriately 50 - 74% of the time - May be physically or verbally inappropriate.  Problem Solving Problem solving assist level: Solves basic 25 - 49% of the time - needs direction more than half the time to initiate, plan or complete simple activities  Memory Memory assist level: Recognizes or recalls 25 - 49% of the time/requires cueing 50 - 75% of the time    Pain No/denies Pain   Therapy/Group: Individual Therapy  Murice Barbar 11/15/2016, 3:29 PM

## 2016-11-15 NOTE — Progress Notes (Signed)
Occupational Therapy Session Note  Patient Details  Name: Neyland Brunning MRN: [id number] Date of Birth: [birth year]  Today's Date: 11/15/2016 OT Individual Time: 1415-1531 OT Individual Time Calculation (min): 76 min     Skilled Therapeutic Interventions/Progress Updates:    Pt completed transfers via sliding board and bobath technique with total assist +2 (pt 25%) to mat, wheelchair, and bed during transfer.  Had him work on holding the LUE with the right hand during all transfers to help decreased pushing to the left.  In sitting provided mirror for feedback with work on functional reaching task and lateral weightshifts to the right side to decreased pushing as well.  Pt demonstrates motor impersistence in his trunk as he loses his balance posteriorly along with pushing to the right.  Total assist for dynamic sitting balance secondary to falling backwards and not being able to activate his abdominals for return to sitting.  Utilized bed side table for upright sitting posture and anterior pelvic tilt having pt place both UEs on the table and work on pushing it forward and forward and to the right toward the pushing side.  Max facilitation at trunk with max instructional cueing to maintain neutral head extension as well.  Pt returned to bed at end of session with call button and phone in reach and [facility] in place on the LLE.   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   Therapy/Group: Individual Therapy  Jadzia Ibsen OTR/L 11/15/2016, 4:20 PM

## 2016-11-16 ENCOUNTER — Inpatient Hospital Stay (HOSPITAL_COMMUNITY): Payer: Medicare Other

## 2016-11-16 ENCOUNTER — Inpatient Hospital Stay (HOSPITAL_COMMUNITY): Payer: Medicare Other | Admitting: Occupational Therapy

## 2016-11-16 DIAGNOSIS — I69322 Dysarthria following cerebral infarction: Secondary | ICD-10-CM

## 2016-11-16 NOTE — Plan of Care (Signed)
Problem: RH BLADDER ELIMINATION Goal: RH STG MANAGE BLADDER WITH ASSISTANCE STG Manage Bladder With moderate Assistance    Outcome: Not Progressing Incontinent uses  Condom cath

## 2016-11-16 NOTE — Progress Notes (Signed)
Occupational Therapy Session Note  Patient Details  Name: [name] Cook MRN: [id number] Date of Birth: 31-Jul-1941  Today's Date: 11/16/2016 OT Individual Time: 0800-0900 OT Individual Time Calculation (min): 60 min     Short Term Goals: Week 1:  OT Short Term Goal 1 (Week 1): Pt will maintan sitting balance with min A during simple ADL task OT Short Term Goal 1 - Progress (Week 1): Progressing toward goal OT Short Term Goal 2 (Week 1): Pt will transfer with mod A +2 with LRAD bed/ chair in prep for ADL task OT Short Term Goal 2 - Progress (Week 1): Progressing toward goal OT Short Term Goal 3 (Week 1): Pt will demonstrate safe management of left UE with bed mobility with mod cuing  OT Short Term Goal 3 - Progress (Week 1): Progressing toward goal OT Short Term Goal 4 (Week 1): Pt will roll for LB clothing management with mod A with bed rails  OT Short Term Goal 4 - Progress (Week 1): Progressing toward goal Week 2:  OT Short Term Goal 1 (Week 2): Pt will maintan sitting balance with min A during simple ADL task OT Short Term Goal 2 (Week 2): Pt will transfer with mod A +2 with LRAD bed/ chair in prep for ADL task OT Short Term Goal 3 (Week 2): Pt will demonstrate safe management of left UE with bed mobility with mod cuing  OT Short Term Goal 4 (Week 2): Pt will roll for LB clothing management with mod A with bed rails  Week 3:     Skilled Therapeutic Interventions/Progress Updates:    Session focused on rolling in bed to don pants and bathe LB to prepare for OOB with max assist and verbal cues for technique and hand placement.  Rolling to right with max cues for holding LUE across body;  Provided max facilitation to pelvis for anterior rotation.  Supine -> sit with HOB elevated with pt initiating scooting hips forward but required max assist overall with cues for technique and attention.  Sat EOB with max assist and cues for using RUE for holding.  Used  slideboard transfer training with  mod to max +2 (pt demonstrating improved scooting technique once on the board) into standard manual w/c with assist for weighshifting and balance>  Provided manual assist to lean to the left to mobilize hip lateral movements.  Completed UB ADL at sink with mod assist for dressing.  Max cues for attention to L during session.  PPt verbalized concern that he felt his family was not talking to him; he was unable to elaborate.  Recommend Neuro psych for intervention.    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   Therapy/Group: Individual Therapy  Lisa Roca 11/16/2016, 2:37 PM

## 2016-11-16 NOTE — Progress Notes (Signed)
Occupational Therapy Session Note  Patient Details  Name: [name] Cook MRN: [id number] Date of Birth: 1941-03-29  Today's Date: 11/16/2016 OT Individual Time: 1100-1200 OT Individual Time Calculation (min): 60 min   Short Term Goals: Week 2:  OT Short Term Goal 1 (Week 2): Pt will maintan sitting balance with min A during simple ADL task OT Short Term Goal 2 (Week 2): Pt will transfer with mod A +2 with LRAD bed/ chair in prep for ADL task OT Short Term Goal 3 (Week 2): Pt will demonstrate safe management of left UE with bed mobility with mod cuing  OT Short Term Goal 4 (Week 2): Pt will roll for LB clothing management with mod A with bed rails   Skilled Therapeutic Interventions/Progress Updates:   Therapeutic activity with focus on improved bed mobility (NDT), attention to left UE, dynamic sitting balance, improved awareness, slide board transfers and seated grooming skills (shaving and hair care at sink).    Pt re-educated on bed rolls (using bilateral UE), and required hand guidance to find left wrist, for technique to roll to his right with left hip/knee flexed and required  max assist to push off bed to sit at EOB.   Pt then completed functional reaching with right hand, supported and unsupported sitting balance tasks with +2 at posterior for safety, 1 sit<> stand with max assist after setup to don prosthesis, and slide board transfer with max assist (+2 to stabilize w/c)  to advance to his left laterally, and manual facilitation for leg placement/sequencing during transfer.   Pt was then escorted to sink in w/c and re-educated on left attention and management of LUE during seated grooming skills reinforcement training.  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   Therapy/Group: Individual Therapy  [name] 11/16/2016, 4:48 PM

## 2016-11-16 NOTE — Progress Notes (Signed)
[place] PHYSICAL MEDICINE & REHABILITATION     PROGRESS NOTE    Subjective/Complaints: Pt seen laying in bed this AM.  He slept well overnight.  He notes improvement in BM with stool softener.    ROS: Denies CP, SOB, nausea, vomiting, diarrhea  Objective: Vital Signs: Blood pressure (!) 140/53, pulse 88, temperature 98.1 F (36.7 C), temperature source Oral, resp. rate 18, height 5\' 8"  (1.727 m), weight 85.9 kg (189 lb 6.4 oz), SpO2 97 %. No results found. No results for input(s): WBC, HGB, HCT, PLT in the last 72 hours. No results for input(s): NA, K, CL, GLUCOSE, BUN, CREATININE, CALCIUM in the last 72 hours.  Invalid input(s): CO CBG (last 3)   Recent Labs  11/13/16 2145 11/14/16 0625 11/14/16 1138  GLUCAP 131* 122* 129*    Wt Readings from Last 3 Encounters:  11/13/16 85.9 kg (189 lb 6.4 oz)  10/28/16 92 kg (202 lb 12.8 oz)  09/20/16 95.8 kg (211 lb 4 oz)    Physical Exam:  Constitutional: NAD. Vital signs reviewed.   HENT: Normocephalic and atraumatic.  Eyes: EOM are normal. No discharge.  Cardiovascular: RRR, +murmur. No JVD. Respiratory: clear. Unlabored.  GI: Soft. Bowel sounds are normal. Musculoskeletal: He exhibits no edema. He exhibits no tenderness.  RLE BKA healed Neurological: He is alert.  Alert and oriented x3 with cues. Dysarthria Left facial weakness (stable) Motor: LUE 0/5. mAS 1/4 with finger flexors (unchanged) LLE: HF 1/5, HE 2+/5, KE 1+/5, ADF 1+/5 (unchanged) RUE: 5/5 proximal to distal RLE: HF 5/5. Skin: Skin is warm and dry and intact.  Psychiatric: Flat.   Assessment/Plan: 1. Functional deficits secondary to right basal ganglia infarct which require 3+ hours per day of interdisciplinary therapy in a comprehensive inpatient rehab setting. Physiatrist is providing close team supervision and 24 hour management of active medical problems listed below. Physiatrist and rehab team continue to assess barriers to discharge/monitor  patient progress toward functional and medical goals.  Function:  Bathing Bathing position   Position: Bed  Bathing parts Body parts bathed by patient: Left arm, Chest, Abdomen, Front perineal area, Right upper leg, Left upper leg Body parts bathed by helper: Left lower leg, Buttocks, Right arm, Back  Bathing assist Assist Level: 2 helpers      Upper Body Dressing/Undressing Upper body dressing   What is the patient wearing?: Pull over shirt/dress     Pull over shirt/dress - Perfomed by patient: Thread/unthread right sleeve, Put head through opening Pull over shirt/dress - Perfomed by helper: Pull shirt over trunk, Thread/unthread left sleeve        Upper body assist Assist Level:  (Max assist for sitting balance, pt completing 2/4 steps)      Lower Body Dressing/Undressing Lower body dressing   What is the patient wearing?: Pants, AFO, Socks, Shoes     Pants- Performed by patient: Thread/unthread right pants leg Pants- Performed by helper: Thread/unthread right pants leg, Pull pants up/down     Socks - Performed by patient: Don/doff right sock (stump sock) Socks - Performed by helper: Don/doff left sock   Shoes - Performed by helper: Don/doff left shoe   AFO - Performed by helper: Don/doff right AFO (prosthesis)      Lower body assist Assist for lower body dressing:  (Total assist)      Toileting Toileting Toileting activity did not occur: No continent bowel/bladder event   Toileting steps completed by helper: Adjust clothing prior to toileting, Performs perineal hygiene, Adjust  clothing after toileting Toileting Assistive Devices: Toilet aid  Toileting assist Assist level: Two helpers   Transfers Chair/bed transfer   Chair/bed transfer method: Lateral scoot Chair/bed transfer assist level: 2 helpers Chair/bed transfer assistive device: Sliding board, Armrests, Prosthesis Mechanical lift: Maximove   Locomotion Ambulation Ambulation activity did not occur:  Safety/medical concerns         Wheelchair   Type: Manual Max wheelchair distance: 30' Assist Level: Maximal assistance (Pt 25 - 49%)  Cognition Comprehension Comprehension assist level: Follows basic conversation/direction with extra time/assistive device  Expression Expression assist level: Expresses basic needs/ideas: With extra time/assistive device  Social Interaction Social Interaction assist level: Interacts appropriately 50 - 74% of the time - May be physically or verbally inappropriate.  Problem Solving Problem solving assist level: Solves basic 25 - 49% of the time - needs direction more than half the time to initiate, plan or complete simple activities  Memory Memory assist level: Recognizes or recalls 25 - 49% of the time/requires cueing 50 - 75% of the time   Medical Problem List and Plan: 1.  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  DVT Prophylaxis/Anticoagulation: Pharmaceutical: Lovenox 3. Pain Management: tylenol prn. Continue to educate patient family on LUE positioning.  4. Mood: LCSW to follow for evaluation and support.  5. Neuropsych: This patient is not fully capable of making decisions on his own behalf. 6. Skin/Wound Care: routine pressure relief measures 7. Fluids/Electrolytes/Nutrition: Monitor I/O.   D1 Honey, advanced to D2 Honey  Monitor hydration on honey liquids- concerns of adequate intake with ongoing hiccups and coughing due to difficulty with secretions.  8. HTN: Monitor BP. Goal of BP 130-150--avoid hypotension  Lisinopril 2.5 started 12/11, increased to 7.5 on 12/12, increased to 10 on 12/13, d/ced on 12/16.    Norvasc 5mg  started 12/12, increased to 10 on 12/16  Hydralazine 25 TID started 12/16, increased to 50 on 12/18  Within goal range on 12/23  Will cont to monitor Vitals:    11/15/16 2202 11/16/16 0539  BP: (!) 144/61 (!) 140/53  Pulse: 88 88  Resp:  18  Temp:  98.1 F (36.7 C)   9. 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  11. Hiccups:   Baclofen increased to 10mg  QID monitor for sedation, ?resolved, weaned to 5mg  on 12/13, decreased to BID on 12/18, d/ced 12/21 12. GERD: On Zantac bid. 13. Pre-diabtes: Hgb A1c- 5.9. CBG (last 3)   Recent Labs  11/13/16 2145 11/14/16 0625 11/14/16 1138  GLUCAP 131* 122* 129*   Stable 12/21, d/ced CBGs 14. Mild hyponatremia: Resolved  K+ 3.5 on 12/20 after supplementation  Will cont to monitor 15. AKI: Resolved  Dehydration +/- ACE  Started IVF at night, ACE d/ced  Cr 0.91 on 12/20 (stable) 16. Hypernatremia: Resolved  Cont to monitor 17. Constipation  Increased reg on  12/22   LOS (Days) 17 A FACE TO FACE EVALUATION WAS PERFORMED  Tressia Labrum Lorie Phenix 11/16/2016 12:06 PM

## 2016-11-16 NOTE — Progress Notes (Signed)
Physical Therapy Note  Patient Details  Name: [name] Cook MRN: [id number] Date of Birth: 1941/04/08 Today's Date: 11/16/2016  1300-1420, 80 min individual tx Pain: none reported  Tilt in space W/c> mat> regular w/c with cushion removed, using slide board.  +2 to R with no effort perceived from  pt; +1 max assist to L slightly downhill.  W/c propulsion using hemi method x 40' x 3 using cones for visual cues, with max assist to mod assist by end of practice, for steering consistently with RLE to avoid cones on L.  Pt limited by R  distraction and L inattention. Maxi Move to trasnfer pt to tilt table, +2.  Pt tolerated tilt table at 80 degrees x 20 minutes during RUE activity requiring reaching across midline to L and visually tracking to L to match pegs by color in rows. Pt scanned to the L accurately without  cues 75% of trials. Pt transferred back to tilt in space w/c, and left resting in w/c with [name], RT ; quick release belt donned.  See function navigator for current status   [name] 11/16/2016, 11:20 AM

## 2016-11-17 NOTE — Progress Notes (Signed)
[place] PHYSICAL MEDICINE & REHABILITATION     PROGRESS NOTE    Subjective/Complaints: Pt laying in bed this AM.  He is more alert.  He states he slept well overnight.   ROS: Denies CP, SOB, nausea, vomiting, diarrhea  Objective: Vital Signs: Blood pressure (!) 152/50, pulse 85, temperature 97.8 F (36.6 C), temperature source Oral, resp. rate 18, height 5\' 8"  (1.727 m), weight 85.9 kg (189 lb 6.4 oz), SpO2 96 %. No results found. No results for input(s): WBC, HGB, HCT, PLT in the last 72 hours. No results for input(s): NA, K, CL, GLUCOSE, BUN, CREATININE, CALCIUM in the last 72 hours.  Invalid input(s): CO CBG (last 3)  No results for input(s): GLUCAP in the last 72 hours.  Wt Readings from Last 3 Encounters:  11/13/16 85.9 kg (189 lb 6.4 oz)  10/28/16 92 kg (202 lb 12.8 oz)  09/20/16 95.8 kg (211 lb 4 oz)    Physical Exam:  Constitutional: NAD. Vital signs reviewed.   HENT: Normocephalic and atraumatic.  Eyes: EOM are normal. No discharge.  Cardiovascular: RRR, +murmur. No JVD. Respiratory: clear. Unlabored.  GI: Soft. Bowel sounds are normal. Musculoskeletal: He exhibits no edema. He exhibits no tenderness.  RLE BKA healed Neurological: He is alert.  Alert and oriented x3 with cues. Dysarthria Left facial weakness (stable) Motor: LUE 0/5. mAS 1/4 with finger flexors (stable) LLE: HF 1/5, HE 2+/5, KE 1+/5, ADF 1+/5 (unchanged) RUE: 5/5 proximal to distal RLE: HF 5/5. Skin: Skin is warm and dry and intact.  Psychiatric: Flat.   Assessment/Plan: 1. Functional deficits secondary to right basal ganglia infarct which require 3+ hours per day of interdisciplinary therapy in a comprehensive inpatient rehab setting. Physiatrist is providing close team supervision and 24 hour management of active medical problems listed below. Physiatrist and rehab team continue to assess barriers to discharge/monitor patient progress toward functional and medical  goals.  Function:  Bathing Bathing position   Position: Bed  Bathing parts Body parts bathed by patient: Left arm, Chest, Abdomen, Front perineal area, Right upper leg, Left upper leg Body parts bathed by helper: Left lower leg, Buttocks, Right arm, Back  Bathing assist Assist Level: 2 helpers      Upper Body Dressing/Undressing Upper body dressing   What is the patient wearing?: Pull over shirt/dress     Pull over shirt/dress - Perfomed by patient: Thread/unthread right sleeve, Put head through opening Pull over shirt/dress - Perfomed by helper: Pull shirt over trunk, Thread/unthread left sleeve        Upper body assist Assist Level: Touching or steadying assistance(Pt > 75%)      Lower Body Dressing/Undressing Lower body dressing   What is the patient wearing?: Pants, AFO, Socks, Shoes     Pants- Performed by patient: Thread/unthread right pants leg Pants- Performed by helper: Thread/unthread right pants leg, Pull pants up/down     Socks - Performed by patient: Don/doff right sock Socks - Performed by helper: Don/doff left sock   Shoes - Performed by helper: Don/doff left shoe   AFO - Performed by helper: Don/doff right AFO      Lower body assist Assist for lower body dressing:  (Total assist)      Toileting Toileting Toileting activity did not occur: No continent bowel/bladder event   Toileting steps completed by helper: Adjust clothing prior to toileting, Performs perineal hygiene, Adjust clothing after toileting Toileting Assistive Devices: Toilet aid  Toileting assist Assist level: Two helpers   Transfers  Chair/bed transfer   Chair/bed transfer method: Lateral scoot Chair/bed transfer assist level: 2 helpers Chair/bed transfer assistive device: Sliding board, Armrests, Prosthesis Mechanical lift: Maximove   Locomotion Ambulation Ambulation activity did not occur: Safety/medical concerns         Wheelchair   Type: Manual Max wheelchair distance:  40 Assist Level: Moderate assistance (Pt 50 - 74%), Maximal assistance (Pt 25 - 49%)  Cognition Comprehension Comprehension assist level: Understands complex 90% of the time/cues 10% of the time  Expression Expression assist level: Expresses basic needs/ideas: With extra time/assistive device  Social Interaction Social Interaction assist level: Interacts appropriately 50 - 74% of the time - May be physically or verbally inappropriate.  Problem Solving Problem solving assist level: Solves basic 25 - 49% of the time - needs direction more than half the time to initiate, plan or complete simple activities  Memory Memory assist level: Recognizes or recalls 25 - 49% of the time/requires cueing 50 - 75% of the time   Medical Problem List and Plan: 1.  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  DVT Prophylaxis/Anticoagulation: Pharmaceutical: Lovenox 3. Pain Management: tylenol prn. Continue to educate patient family on LUE positioning.  4. Mood: LCSW to follow for evaluation and support.  5. Neuropsych: This patient is not fully capable of making decisions on his own behalf. 6. Skin/Wound Care: routine pressure relief measures 7. Fluids/Electrolytes/Nutrition: Monitor I/O.   D1 Honey, advanced to D2 Honey  Monitor hydration on honey liquids- concerns of adequate intake with ongoing hiccups and coughing due to difficulty with secretions.  8. HTN: Monitor BP. Goal of BP 130-150--avoid hypotension  Lisinopril 2.5 started 12/11, increased to 7.5 on 12/12, increased to 10 on 12/13, d/ced on 12/16.    Norvasc 5mg  started 12/12, increased to 10 on 12/16  Hydralazine 25 TID started 12/16, increased to 50 on 12/18  Within goal range on 12/24  Will cont to monitor Vitals:   11/17/16 0538 11/17/16 1345  BP: (!) 138/58 (!) 152/50  Pulse: 82  85  Resp: 18 18  Temp: 98 F (36.7 C) 97.8 F (36.6 C)   9. 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  11. Hiccups:   Baclofen increased to 10mg  QID monitor for sedation, ?resolved, weaned to 5mg  on 12/13, decreased to BID on 12/18, d/ced 12/21 12. GERD: On Zantac bid. 13. Pre-diabtes: Hgb A1c- 5.9.  Stable 12/21, d/ced CBGs 14. Mild hyponatremia: Resolved  K+ 3.5 on 12/20 after supplementation  Will cont to monitor 15. AKI: Resolved  Dehydration +/- ACE  Started IVF at night, ACE d/ced  Cr 0.91 on 12/20 (stable) 16. Hypernatremia: Resolved  Cont to monitor 17. Constipation  Increased reg on  12/22   LOS (Days) 18 A FACE TO FACE EVALUATION WAS PERFORMED  Emonii Wienke Lorie Phenix 11/17/2016 3:21 PM

## 2016-11-17 NOTE — Plan of Care (Signed)
Problem: RH BLADDER ELIMINATION Goal: RH STG MANAGE BLADDER WITH ASSISTANCE STG Manage Bladder With moderate Assistance   Outcome: Not Progressing Pt has condom cath   

## 2016-11-18 DIAGNOSIS — G8194 Hemiplegia, unspecified affecting left nondominant side: Secondary | ICD-10-CM

## 2016-11-18 DIAGNOSIS — IMO0002 Reserved for concepts with insufficient information to code with codable children: Secondary | ICD-10-CM

## 2016-11-18 NOTE — Progress Notes (Signed)
[facility] PHYSICAL MEDICINE & REHABILITATION     PROGRESS NOTE    Subjective/Complaints: Pt seen laying in bed this AM.  He slept well overnight.  He denies complaints this AM.   ROS: Denies CP, SOB, nausea, vomiting, diarrhea  Objective: Vital Signs: Blood pressure (!) 154/66, pulse 85, temperature 98.1 F (36.7 C), temperature source Oral, resp. rate 18, height 5\' 8"  (1.727 m), weight 85.9 kg (189 lb 6.4 oz), SpO2 95 %. No results found. No results for input(s): WBC, HGB, HCT, PLT in the last 72 hours. No results for input(s): NA, K, CL, GLUCOSE, BUN, CREATININE, CALCIUM in the last 72 hours.  Invalid input(s): CO CBG (last 3)  No results for input(s): GLUCAP in the last 72 hours.  Wt Readings from Last 3 Encounters:  11/13/16 85.9 kg (189 lb 6.4 oz)  10/28/16 92 kg (202 lb 12.8 oz)  09/20/16 95.8 kg (211 lb 4 oz)    Physical Exam:  Constitutional: NAD. Vital signs reviewed.   HENT: Normocephalic and atraumatic.  Eyes: EOM are normal. No discharge.  Cardiovascular: RRR, +murmur. No JVD. Respiratory: clear. Unlabored.  GI: Soft. Bowel sounds are normal. Musculoskeletal: He exhibits no edema. He exhibits no tenderness.  RLE BKA healed Neurological: He is alert.  Alert and oriented x3 with cues. Dysarthria Left facial weakness (stable) Motor: LUE 0/5. mAS 1/4 with finger flexors (unchanged) LLE: HF 1/5, HE 2+/5, KE 1+/5, ADF 1+/5 RUE: 5/5 proximal to distal RLE: HF 5/5. Skin: Skin is warm and dry and intact.  Psychiatric: Flat.   Assessment/Plan: 1. Functional deficits secondary to right basal ganglia infarct which require 3+ hours per day of interdisciplinary therapy in a comprehensive inpatient rehab setting. Physiatrist is providing close team supervision and 24 hour management of active medical problems listed below. Physiatrist and rehab team continue to assess barriers to discharge/monitor patient progress toward functional and medical  goals.  Function:  Bathing Bathing position   Position: Bed  Bathing parts Body parts bathed by patient: Left arm, Chest, Abdomen, Front perineal area, Right upper leg, Left upper leg Body parts bathed by helper: Left lower leg, Buttocks, Right arm, Back  Bathing assist Assist Level: 2 helpers      Upper Body Dressing/Undressing Upper body dressing   What is the patient wearing?: Pull over shirt/dress     Pull over shirt/dress - Perfomed by patient: Thread/unthread right sleeve, Put head through opening Pull over shirt/dress - Perfomed by helper: Pull shirt over trunk, Thread/unthread left sleeve        Upper body assist Assist Level: Touching or steadying assistance(Pt > 75%)      Lower Body Dressing/Undressing Lower body dressing   What is the patient wearing?: Pants, AFO, Socks, Shoes     Pants- Performed by patient: Thread/unthread right pants leg Pants- Performed by helper: Thread/unthread right pants leg, Pull pants up/down     Socks - Performed by patient: Don/doff right sock Socks - Performed by helper: Don/doff left sock   Shoes - Performed by helper: Don/doff left shoe   AFO - Performed by helper: Don/doff right AFO      Lower body assist Assist for lower body dressing:  (Total assist)      Toileting Toileting Toileting activity did not occur: No continent bowel/bladder event   Toileting steps completed by helper: Adjust clothing prior to toileting, Performs perineal hygiene, Adjust clothing after toileting Toileting Assistive Devices: Toilet aid  Toileting assist Assist level: Two helpers   Transfers Chair/bed  transfer   Chair/bed transfer method: Lateral scoot Chair/bed transfer assist level: 2 helpers Chair/bed transfer assistive device: Sliding board, Armrests, Prosthesis Mechanical lift: Maximove   Locomotion Ambulation Ambulation activity did not occur: Safety/medical concerns         Wheelchair   Type: Manual Max wheelchair distance:  40 Assist Level: Moderate assistance (Pt 50 - 74%), Maximal assistance (Pt 25 - 49%)  Cognition Comprehension Comprehension assist level: Understands complex 90% of the time/cues 10% of the time  Expression Expression assist level: Expresses basic needs/ideas: With extra time/assistive device  Social Interaction Social Interaction assist level: Interacts appropriately 50 - 74% of the time - May be physically or verbally inappropriate.  Problem Solving Problem solving assist level: Solves basic 25 - 49% of the time - needs direction more than half the time to initiate, plan or complete simple activities  Memory Memory assist level: Recognizes or recalls 25 - 49% of the time/requires cueing 50 - 75% of the time   Medical Problem List and Plan: 1.  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  DVT Prophylaxis/Anticoagulation: Pharmaceutical: Lovenox 3. Pain Management: tylenol prn. Continue to educate patient family on LUE positioning.  4. Mood: LCSW to follow for evaluation and support.  5. Neuropsych: This patient is not fully capable of making decisions on his own behalf. 6. Skin/Wound Care: routine pressure relief measures 7. Fluids/Electrolytes/Nutrition: Monitor I/O.   D1 Honey, advanced to D2 Honey  Monitor hydration on honey liquids- concerns of adequate intake with ongoing hiccups and coughing due to difficulty with secretions.  8. HTN: Monitor BP. Goal of BP 130-150--avoid hypotension  Lisinopril 2.5 started 12/11, increased to 7.5 on 12/12, increased to 10 on 12/13, d/ced on 12/16.    Norvasc 5mg  started 12/12, increased to 10 on 12/16  Hydralazine 25 TID started 12/16, increased to 50 on 12/18  Within goal range on 12/25  Will cont to monitor Vitals:   11/17/16 2112 11/18/16 0459  BP: (!) 150/50 (!) 154/66  Pulse:  85   Resp:  18  Temp:  98.1 F (36.7 C)   9. 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  11. Hiccups:   Baclofen increased to 10mg  QID monitor for sedation, ?resolved, weaned to 5mg  on 12/13, decreased to BID on 12/18, d/ced 12/21 12. GERD: On Zantac bid. 13. Pre-diabtes: Hgb A1c- 5.9.  Stable 12/21, d/ced CBGs 14. Mild hyponatremia: Resolved  K+ 3.5 on 12/20 after supplementation  Will cont to monitor 15. AKI: Resolved  Dehydration +/- ACE  Started IVF at night, ACE d/ced  Cr 0.91 on 12/20 (stable) 16. Hypernatremia: Resolved  Cont to monitor 17. Constipation  Increased reg on 12/22  Improving   LOS (Days) 19 A FACE TO FACE EVALUATION WAS PERFORMED  Ankit Lorie Phenix 11/18/2016 10:10 AM

## 2016-11-19 ENCOUNTER — Inpatient Hospital Stay (HOSPITAL_COMMUNITY): Payer: Medicare Other | Admitting: Occupational Therapy

## 2016-11-19 ENCOUNTER — Inpatient Hospital Stay (HOSPITAL_COMMUNITY): Payer: Medicare Other | Admitting: Speech Pathology

## 2016-11-19 ENCOUNTER — Telehealth: Payer: Self-pay | Admitting: Vascular Surgery

## 2016-11-19 ENCOUNTER — Inpatient Hospital Stay (HOSPITAL_COMMUNITY): Payer: Medicare Other | Admitting: Physical Therapy

## 2016-11-19 MED ORDER — FLEET ENEMA 7-19 GM/118ML RE ENEM: 1.0000 | ENEMA | RECTAL | Status: DC

## 2016-11-19 MED ORDER — BACLOFEN 1 MG/ML ORAL SUSPENSION: 2.5000 mg | Freq: Four times a day (QID) | ORAL | Status: DC | PRN

## 2016-11-19 MED ORDER — FLUOXETINE HCL 20 MG PO CAPS: 20.0000 mg | ORAL_CAPSULE | Freq: Every day | ORAL | 3 refills | Status: AC

## 2016-11-19 MED ORDER — ASPIRIN 325 MG PO TABS: 325.0000 mg | ORAL_TABLET | Freq: Every day | ORAL | Status: AC

## 2016-11-19 MED ORDER — SENNOSIDES-DOCUSATE SODIUM 8.6-50 MG PO TABS: 2.0000 | ORAL_TABLET | Freq: Two times a day (BID) | ORAL | Status: DC

## 2016-11-19 MED ORDER — PRO-STAT SUGAR FREE PO LIQD: 30.0000 mL | Freq: Two times a day (BID) | ORAL | 0 refills | Status: DC

## 2016-11-19 MED ORDER — ACETAMINOPHEN 325 MG PO TABS: 325.0000 mg | ORAL_TABLET | ORAL | Status: DC | PRN

## 2016-11-19 MED ORDER — CHLORHEXIDINE GLUCONATE 0.12 % MT SOLN: 15.0000 mL | Freq: Two times a day (BID) | OROMUCOSAL | 0 refills | Status: AC

## 2016-11-19 MED ORDER — SENNOSIDES-DOCUSATE SODIUM 8.6-50 MG PO TABS
2.0000 | ORAL_TABLET | Freq: Three times a day (TID) | ORAL | Status: DC
  Administered 2016-11-19 – 2016-11-20 (×5): 2 via ORAL
  Filled 2016-11-19 (×5): qty 2

## 2016-11-19 NOTE — Progress Notes (Signed)
Occupational Therapy Weekly Progress Note  Patient Details  Name: [name] Cook MRN: [id number] Date of Birth: 08-04-1941  Beginning of progress report period: October 31, 2016 End of progress report period: November 19, 2016  Today's Date: 11/19/2016 OT Individual Time: 1100-1203 OT Individual Time Calculation (min): 63 min     Patient has met 2 of 4 short term goals.  Pt is slowly progressing towards his OT goals. He has demonstrated improved attention and awareness and can correct posture 50% of the time when cued. Pt continues to require Max-Total bathing/dressing, but is requiring cues less frequently.  Pt also has some return of L Le, as well as trace muscle activation of L UE which he is working towards using in a functional way. Plan for pt to continue OT in next venue of care.  Patient continues to demonstrate the following deficits: muscle weakness, abnormal tone, unbalanced muscle activation, ataxia, decreased coordination and decreased motor planning, decreased midline orientation, decreased attention to left and decreased motor planning, decreased initiation, decreased attention, decreased awareness, decreased problem solving, decreased safety awareness, decreased memory and delayed processing and decreased sitting balance, decreased standing balance, decreased postural control, hemiplegia and decreased balance strategies and therefore will continue to benefit from skilled OT intervention to enhance overall performance with Reduce care partner burden.  Patient progressing toward long term goals..  Continue plan of care.  OT Short Term Goals Week 2:  OT Short Term Goal 1 (Week 2): Pt will maintan sitting balance with min A during simple ADL task OT Short Term Goal 1 - Progress (Week 2): Discontinued (comment) (Revise goal 2/2 slower progress) OT Short Term Goal 2 (Week 2):  (revise goal 2/2 slower progress) OT Short Term Goal 2 - Progress (Week 2): Discontinued (comment) OT  Short Term Goal 3 (Week 2): Pt will demonstrate safe management of left UE with bed mobility with mod cuing  OT Short Term Goal 3 - Progress (Week 2): Met OT Short Term Goal 4 (Week 2): Pt will roll for LB clothing management with mod A with bed rails  OT Short Term Goal 4 - Progress (Week 2): Met Week 3:  OT Short Term Goal 1 (Week 3): Pt will maintain sitting balance with Min 50% of the time during ADL AE OT Short Term Goal 2 (Week 3): Pt will recall hemi-dressing techniques 2 consecutive days with min questioning cues OT Short Term Goal 3 (Week 3): Pt will attend to items on L side with min questioning cues during functional task OT Short Term Goal 4 (Week 3): Pt and family member will demonstrate understanding of retrograde massage and PROM ther ex   Skilled Therapeutic Interventions/Progress Updates:   Pt greeted in TIS w/c with spouse present and agreeable to OT . OT treatment session focused on L NMR, attention, modified UB dressing, retrograde massage, family education, and sit<>stands. Retrograde massage completed for swelling on L UE and reviewed tehnique with pt and spouse.  L NMR on L UE + joint compression in sitting position to bring pt through full ROM. Trace muscle activation, +synergystic patterns.  Pt able to maintain attention to dressing task today in minimally distracting environment with Max A and Mod multimodal cues for hemi-techniques. Nurse Tech present to provide +2 assist. Clarise Cruz plus used 2x for sit<>stand. Pt required 2 person assist 2/2 Max  A to maintain balance on L hemi-side, while second person assist with LB bathing/dressing -brief change, peri-care, and to pull pants over hip. VC for posture and  hip activation, pt tolerated 3-5 minute standing bouts. Pt returned to TIS w/c and left with needs met and safety belt on.  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 Braces or  Orthoses: Sling Other Brace/Splint: sling for LUE after fall Restrictions Weight Bearing Restrictions: No Pain: Pain Assessment Pain Assessment: 0-10 Pain Score: 4  Pain Type: Acute pain Pain Location: Shoulder Pain Orientation: Left Pain Descriptors / Indicators: Aching Pain Onset: With Activity Pain Intervention(s): Repositioned ADL: ADL ADL Comments: see functional navigator    See Function Navigator for Current Functional Status.   Therapy/Group: Individual Therapy  Valma Cava 11/19/2016, 12:38 PM

## 2016-11-19 NOTE — Progress Notes (Signed)
Speech Language Pathology Daily Session Note  Patient Details  Name: [name] Cook MRN: [id number] Date of Birth: 1941-11-19  Today's Date: 11/19/2016 SLP Individual Time: 1005-1100 SLP Individual Time Calculation (min): 55 min   Short Term Goals: Week 3: SLP Short Term Goal 1 (Week 3): Pt will attend to basic, familiar tasks for ~5 minutes intervals with Mod A verbal cues for redirection.  SLP Short Term Goal 2 (Week 3): Pt will attend to left field of environment during functional tasks with Min A verbal cues.  SLP Short Term Goal 3 (Week 3): Pt will consume current diet without overt s/s of aspiration with supervision verbal cues for use of swallowing compensatory strategies.  SLP Short Term Goal 4 (Week 3): Pt will consume trials of thin liquids without overt s/s of aspiration with supervision verbal cues .   Skilled Therapeutic Interventions:  Pt was seen for skilled ST targeting goals for dysphagia and cognition.  Therapist facilitated the session with trials of thin liquids following oral care to continue working towards diet progression and repeat objective study of swallowing function.  Pt demonstrated improved timeliness of swallow initiation with thin liquids and needed only intermittent min assist verbal cues for pt to monitor and correct episodes of oral holding.  No overt s/s of aspiration were evident with thin liquids.  Recommend repeat MBS tomorrow to objectively determine readiness to advance.  Pt was able to sustain his attention to self feeding for ~3 minute intervals with min verbal cues for redirection to task.  Pt was returned to room and left in wheelchair with wife at bedside.  Continue per current plan of care.    Function:  Eating Eating   Modified Consistency Diet: Yes Eating Assist Level: Supervision or verbal cues;Helper checks for pocketed food           Cognition Comprehension Comprehension assist level: Follows basic conversation/direction with extra  time/assistive device  Expression   Expression assist level: Expresses basic needs/ideas: With extra time/assistive device  Social Interaction Social Interaction assist level: Interacts appropriately 50 - 74% of the time - May be physically or verbally inappropriate.  Problem Solving Problem solving assist level: Solves basic 25 - 49% of the time - needs direction more than half the time to initiate, plan or complete simple activities  Memory Memory assist level: Recognizes or recalls 50 - 74% of the time/requires cueing 25 - 49% of the time    Pain Pain Assessment Pain Assessment: 0-10 Pain Score: 6  Pain Type: Acute pain Pain Location: Shoulder Pain Orientation: Left Pain Descriptors / Indicators: Aching Pain Onset: With Activity Pain Intervention(s): RN made aware  Therapy/Group: Individual Therapy  [name] Cook, Selinda Orion 11/19/2016, 12:40 PM

## 2016-11-19 NOTE — Telephone Encounter (Signed)
Cxl'ed 01/01/17 appt. Sched appt 12/31/16 at 11:15. Lm on hm# to inform pt of change.

## 2016-11-19 NOTE — Progress Notes (Signed)
[facility] PHYSICAL MEDICINE & REHABILITATION     PROGRESS NOTE    Subjective/Complaints: Pt seen laying in bed this AM.  He is alert.  He slept well.  He notes improvement in strength in LLE.  He is more aware of LUE deficits.   ROS: Denies CP, SOB, nausea, vomiting, diarrhea  Objective: Vital Signs: Blood pressure (!) 156/63, pulse 84, temperature 98.3 F (36.8 C), temperature source Oral, resp. rate 18, height 5\' 8"  (1.727 m), weight 85.9 kg (189 lb 6.4 oz), SpO2 99 %. No results found. No results for input(s): WBC, HGB, HCT, PLT in the last 72 hours. No results for input(s): NA, K, CL, GLUCOSE, BUN, CREATININE, CALCIUM in the last 72 hours.  Invalid input(s): CO CBG (last 3)  No results for input(s): GLUCAP in the last 72 hours.  Wt Readings from Last 3 Encounters:  11/13/16 85.9 kg (189 lb 6.4 oz)  10/28/16 92 kg (202 lb 12.8 oz)  09/20/16 95.8 kg (211 lb 4 oz)    Physical Exam:  Constitutional: NAD. Vital signs reviewed.   HENT: Normocephalic and atraumatic.  Eyes: EOM are normal. No discharge.  Cardiovascular: RRR, +murmur. No JVD. Respiratory: Clear. Unlabored.  GI: Soft. Bowel sounds are normal. Musculoskeletal: He exhibits no edema. He exhibits no tenderness.  RLE BKA healed Neurological: He is alert.  Alert and oriented x3 with cues. Dysarthria Left facial weakness (stable) Motor: LUE 0/5. mAS 1/4 with finger flexors (persistent) LLE: HF 2/5, HE 2+/5, KE 1+/5, ADF 1+/5  RUE: 5/5 proximal to distal RLE: HF 5/5. Skin: Skin is warm and dry and intact.  Psychiatric: Flat.   Assessment/Plan: 1. Functional deficits secondary to right basal ganglia infarct which require 3+ hours per day of interdisciplinary therapy in a comprehensive inpatient rehab setting. Physiatrist is providing close team supervision and 24 hour management of active medical problems listed below. Physiatrist and rehab team continue to assess barriers to discharge/monitor patient progress  toward functional and medical goals.  Function:  Bathing Bathing position   Position: Bed  Bathing parts Body parts bathed by patient: Left arm, Chest, Abdomen, Front perineal area, Right upper leg, Left upper leg Body parts bathed by helper: Left lower leg, Buttocks, Right arm, Back  Bathing assist Assist Level: 2 helpers      Upper Body Dressing/Undressing Upper body dressing   What is the patient wearing?: Pull over shirt/dress     Pull over shirt/dress - Perfomed by patient: Thread/unthread right sleeve, Put head through opening Pull over shirt/dress - Perfomed by helper: Pull shirt over trunk, Thread/unthread left sleeve        Upper body assist Assist Level: Touching or steadying assistance(Pt > 75%)      Lower Body Dressing/Undressing Lower body dressing   What is the patient wearing?: Pants, AFO, Socks, Shoes     Pants- Performed by patient: Thread/unthread right pants leg Pants- Performed by helper: Thread/unthread right pants leg, Pull pants up/down     Socks - Performed by patient: Don/doff right sock Socks - Performed by helper: Don/doff left sock   Shoes - Performed by helper: Don/doff left shoe   AFO - Performed by helper: Don/doff right AFO      Lower body assist Assist for lower body dressing:  (Total assist)      Toileting Toileting Toileting activity did not occur: No continent bowel/bladder event   Toileting steps completed by helper: Adjust clothing prior to toileting, Performs perineal hygiene, Adjust clothing after toileting Toileting Assistive  Devices: Toilet aid  Toileting assist Assist level: Two helpers   Transfers Chair/bed transfer   Chair/bed transfer method: Lateral scoot Chair/bed transfer assist level: 2 helpers Chair/bed transfer assistive device: Sliding board, Armrests, Prosthesis Mechanical lift: Maximove   Locomotion Ambulation Ambulation activity did not occur: Safety/medical concerns         Wheelchair   Type:  Manual Max wheelchair distance: 40 Assist Level: Moderate assistance (Pt 50 - 74%), Maximal assistance (Pt 25 - 49%)  Cognition Comprehension Comprehension assist level: Understands complex 90% of the time/cues 10% of the time  Expression Expression assist level: Expresses basic needs/ideas: With extra time/assistive device  Social Interaction Social Interaction assist level: Interacts appropriately 75 - 89% of the time - Needs redirection for appropriate language or to initiate interaction.  Problem Solving Problem solving assist level: Solves basic 25 - 49% of the time - needs direction more than half the time to initiate, plan or complete simple activities  Memory Memory assist level: Recognizes or recalls 50 - 74% of the time/requires cueing 25 - 49% of the time   Medical Problem List and Plan: 1.  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  DVT Prophylaxis/Anticoagulation: Pharmaceutical: Lovenox 3. Pain Management: tylenol prn. Continue to educate patient family on LUE positioning.  4. Mood: LCSW to follow for evaluation and support.  5. Neuropsych: This patient is not fully capable of making decisions on his own behalf. 6. Skin/Wound Care: routine pressure relief measures 7. Fluids/Electrolytes/Nutrition: Monitor I/O.   D1 Honey, advanced to D2 Honey  Monitor hydration on honey liquids- concerns of adequate intake with ongoing hiccups and coughing due to difficulty with secretions.  8. HTN: Monitor BP. Goal of BP 130-150--avoid hypotension  Lisinopril 2.5 started 12/11, increased to 7.5 on 12/12, increased to 10 on 12/13, d/ced on 12/16.    Norvasc 5mg  started 12/12, increased to 10 on 12/16  Hydralazine 25 TID started 12/16, increased to 50 on 12/18  Within goal range for the most part on 12/26, may need further  adjustment tomorrow  Will cont to monitor Vitals:   11/18/16 1507 11/19/16 0520  BP: (!) 168/79 (!) 156/63  Pulse: 83 84  Resp: 18 18  Temp: 98.4 F (36.9 C) 98.3 F (36.8 C)   9. 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  11. Hiccups:   Baclofen increased to 10mg  QID monitor for sedation, ?resolved, weaned to 5mg  on 12/13, decreased to BID on 12/18, d/ced 12/21 12. GERD: On Zantac bid. 13. Pre-diabtes: Hgb A1c- 5.9.  Stable 12/21, d/ced CBGs 14. Mild hyponatremia: Resolved  K+ 3.5 on 12/20 after supplementation  Will cont to monitor  Labs ordered for tomorrow 15. AKI: Resolved  Dehydration +/- ACE  Started IVF at night, ACE d/ced  Cr 0.91 on 12/20 (stable)  Labs ordered for tomorrow 16. Hypernatremia: Resolved  Cont to monitor 17. Constipation  Increased reg on 12/22  Improving   LOS (Days) 20 A FACE TO FACE EVALUATION WAS PERFORMED  Ankit Lorie Phenix 11/19/2016 9:53 AM

## 2016-11-19 NOTE — Telephone Encounter (Signed)
-----   Message from Mena Goes, RN sent at 10/31/2016  2:01 PM EST ----- Regarding: RE: 2 month appt Cancel  ----- Message ----- From: Georgiann Mccoy Sent: 10/31/2016   9:27 AM To: Mena Goes, RN Subject: RE: 2 month appt                               They have an appt for ABI and NP in 2 m. Should I cancel that or change it to TFE?  ----- Message ----- From: Mena Goes, RN Sent: 10/30/2016  10:41 PM To: [facility] Admin Pool Subject: 2 month appt                                     ----- Message ----- From: Rosetta Posner, MD Sent: 10/30/2016   4:37 PM To: [facility] Charge Pool  Can come off the list. Needs to see me in the office in 2 months. Does not need any lab studies with his office visit

## 2016-11-19 NOTE — Progress Notes (Signed)
Social Work Patient ID: [name] Cook, male   DOB: 09/25/1941, 75 y.o.   MRN: [id number]  Nicole-SP reports will schedule an MBS for tomorrow to see if can upgrade pt's diet, then will send out FL2. Hopeful will advance diet and then be able to get rid of the IV at night for hydration. Wife and pt aware of this plan.

## 2016-11-19 NOTE — Plan of Care (Signed)
Problem: RH BLADDER ELIMINATION Goal: RH STG MANAGE BLADDER WITH ASSISTANCE STG Manage Bladder With moderate Assistance    Outcome: Not Progressing Verbalized that he needed to use the urinal

## 2016-11-19 NOTE — Plan of Care (Signed)
Problem: RH Wheelchair Mobility Goal: LTG Patient will propel w/c in controlled environment (PT) LTG: Patient will propel wheelchair in controlled environment, # of feet with assist (PT)  Outcome: Not Applicable Date Met: 62/69/48 Not functional wheelchair user at this time due to impaired sitting balance and trunk control, decreased attention, R BKA prosthesis, and flaccid LLE.

## 2016-11-19 NOTE — Progress Notes (Signed)
Physical Therapy Session Note  Patient Details  Name: [name] Cook MRN: [id number] Date of Birth: Apr 27, 1941  Today's Date: 11/19/2016 PT Individual Time: 1400-1515 PT Individual Time Calculation (min): 75 min    Short Term Goals: Week 3:  PT Short Term Goal 1 (Week 3): = LTGs due to SNF placement  Skilled Therapeutic Interventions/Progress Updates:    Patient in [place] wheelchair upon arrival with wife and son in room. Donned R prosthesis and L shoe with total A. Patient instructed in slide board transfers from wheelchair to level mat table with mod A of one person to left with increased time and to R from mat table to level TIS wheelchair with total A x 2 due to pushing. Engaged in static and dynamic sitting balance with improved overall attention to task, midline reorientation, and L attention and demonstrating balance reactions (although insufficient) with primarily posterior LOB with supervision-max A. Performed reaching tasks with RUE to facilitate anterior weight shifts and R lateral trunk elongation and L lateral trunk shortening with cues for technique. Patient reporting constipation, therefore returned to room to attempt [facility] due to improved overall sitting balance and trunk control. Utilized Stedy with max A x 2 to transfer wheelchair > BSC > bed with total A for clothing management and hygiene after continent bowel movement. Patient left in bed with nurse tech and nurse present.   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   Therapy/Group: Individual Therapy  Arie Powell, Murray Hodgkins 11/19/2016, 4:37 PM

## 2016-11-20 ENCOUNTER — Inpatient Hospital Stay (HOSPITAL_COMMUNITY): Payer: Medicare Other | Admitting: Occupational Therapy

## 2016-11-20 ENCOUNTER — Inpatient Hospital Stay (HOSPITAL_COMMUNITY): Payer: Medicare Other

## 2016-11-20 ENCOUNTER — Inpatient Hospital Stay (HOSPITAL_COMMUNITY): Payer: Medicare Other | Admitting: Physical Therapy

## 2016-11-20 ENCOUNTER — Inpatient Hospital Stay (HOSPITAL_COMMUNITY): Payer: Medicare Other | Admitting: Speech Pathology

## 2016-11-20 DIAGNOSIS — D62 Acute posthemorrhagic anemia: Secondary | ICD-10-CM

## 2016-11-20 LAB — CBC WITH DIFFERENTIAL/PLATELET
BASOS ABS: 0.1 10*3/uL (ref 0.0–0.1)
BASOS PCT: 1 %
EOS ABS: 0.4 10*3/uL (ref 0.0–0.7)
EOS PCT: 7 %
HCT: 32.9 % — ABNORMAL LOW (ref 39.0–52.0)
Hemoglobin: 11.4 g/dL — ABNORMAL LOW (ref 13.0–17.0)
LYMPHS PCT: 17 %
Lymphs Abs: 1.1 10*3/uL (ref 0.7–4.0)
MCH: 32.9 pg (ref 26.0–34.0)
MCHC: 34.7 g/dL (ref 30.0–36.0)
MCV: 94.8 fL (ref 78.0–100.0)
MONO ABS: 0.6 10*3/uL (ref 0.1–1.0)
Monocytes Relative: 9 %
Neutro Abs: 4.4 10*3/uL (ref 1.7–7.7)
Neutrophils Relative %: 66 %
Platelets: 263 10*3/uL (ref 150–400)
RBC: 3.47 MIL/uL — ABNORMAL LOW (ref 4.22–5.81)
RDW: 12.6 % (ref 11.5–15.5)
WBC: 6.6 10*3/uL (ref 4.0–10.5)

## 2016-11-20 LAB — BASIC METABOLIC PANEL
Anion gap: 7 (ref 5–15)
BUN: 13 mg/dL (ref 6–20)
CALCIUM: 8 mg/dL — AB (ref 8.9–10.3)
CO2: 23 mmol/L (ref 22–32)
Chloride: 107 mmol/L (ref 101–111)
Creatinine, Ser: 0.68 mg/dL (ref 0.61–1.24)
GFR calc Af Amer: >60 mL/min (ref >60)
GLUCOSE: 114 mg/dL — AB (ref 65–99)
Potassium: 3.4 mmol/L — ABNORMAL LOW (ref 3.5–5.1)
SODIUM: 137 mmol/L (ref 135–145)

## 2016-11-20 MED ORDER — POTASSIUM CHLORIDE CRYS ER 20 MEQ PO TBCR: 20.0000 meq | EXTENDED_RELEASE_TABLET | Freq: Two times a day (BID) | ORAL | Status: AC

## 2016-11-20 MED ORDER — HYDRALAZINE HCL 50 MG PO TABS: 50.0000 mg | ORAL_TABLET | Freq: Three times a day (TID) | ORAL | Status: AC

## 2016-11-20 MED ORDER — AMLODIPINE BESYLATE 10 MG PO TABS: 10.0000 mg | ORAL_TABLET | Freq: Every day | ORAL | Status: AC

## 2016-11-20 MED ORDER — POTASSIUM CHLORIDE CRYS ER 20 MEQ PO TBCR
20.0000 meq | EXTENDED_RELEASE_TABLET | Freq: Two times a day (BID) | ORAL | Status: DC
  Administered 2016-11-20 – 2016-11-21 (×3): 20 meq via ORAL
  Filled 2016-11-20 (×3): qty 1

## 2016-11-20 MED ORDER — SENNOSIDES-DOCUSATE SODIUM 8.6-50 MG PO TABS: 2.0000 | ORAL_TABLET | Freq: Three times a day (TID) | ORAL | Status: AC

## 2016-11-20 NOTE — Progress Notes (Signed)
Social Work Patient ID: [name] Cook, male   DOB: 03/11/1941, 74 y.o.   MRN: [id number]  Spoke with [facility] who has offered a bed for pt tomorrow. Have spoken with son-Lazer and pt and wife To inform of this. All have accepted it and are pleased with the plan this was their first choice. Son to contact [place] at [facility] and set up a time to do paperwork and plan for pt to transport Via non-emergency ambulance around 11:00 am. Team and Pam/MD aware of the plan tomorrow.

## 2016-11-20 NOTE — NC FL2 (Signed)
[place] LEVEL OF CARE SCREENING TOOL     IDENTIFICATION  Patient Name: [name] Cook Birthdate: 1941-10-07 Sex: male Admission Date (Current Location): 10/30/2016  [facility] and [place] Number:  [employer] and Address:  [facility] [place]. [address] [facility], [place] [address] [place], [place] [address]      Provider Number: [id number]  Attending Physician Name and Address:  Ankit Lorie Phenix, MD  Relative Name and Phone Number:  [place] [phone]    Current Level of Care: Other (Comment) (rehab) Recommended Level of Care: [place] Prior Approval Number:    Date Approved/Denied:   PASRR Number: [id number]  Discharge Plan: SNF    Current Diagnoses: Patient Active Problem List   Diagnosis Date Noted  . Acute blood loss anemia   . Left hemiparesis ([place])   . Dysarthria, post-stroke   . Slow transit constipation   . Hiccup   . Dysphagia   . Hyponatremia   . AKI (acute kidney injury) ([place])   . Labile blood pressure   . Hypernatremia   . Hypertensive urgency   . 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 Occlusion and stenosis of carotid artery   . Right basal ganglia embolic stroke ([place]) [id number]  . Cerebral infarction due to stenosis of right carotid artery ([place])   . Gastroesophageal reflux disease without esophagitis   . S/P BKA (below knee amputation) unilateral, right ([place])   . Benign essential HTN   . Hiccups   . 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 Cerebral infarction ([place])   . Hypertensive crisis   . Acute lower UTI   . Stenosis of carotid artery   . Prediabetes   . CVA (cerebral vascular accident) ([place]) 10/28/2016  . Bilateral carotid artery stenosis   . Left-sided weakness 10/27/2016  . Ischemic stroke ([place]) 10/27/2016  . Acute ischemic stroke ([place]) 10/27/2016  . NSTEMI (non-ST elevated myocardial infarction) ([place])  10/12/2014  . Hx of right BKA ([place]) 10/12/2014  . 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 Atherosclerosis of native arteries of extremity with intermittent claudication ([place]) 12/08/2013  . Diabetes mellitus with circulatory complication ([place])   . Essential hypertension, benign 07/17/2013  . Hypokalemia 07/17/2013  . Non-ST elevation myocardial infarction (NSTEMI), initial episode of care ([place]) 07/16/2013  . GERD (gastroesophageal reflux disease) 03/15/2013  . Peripheral vascular disease ([place]) 03/03/2013  . CAROTID BRUIT 02/06/2011  . Dyslipidemia 10/31/2009  . Essential hypertension 10/31/2009  . Coronary atherosclerosis 10/31/2009    Orientation RESPIRATION BLADDER Height & Weight     Self, Time, Situation, Place  Normal Incontinent (Timed tolieting) Weight: 189 lb 6.4 oz (85.9 kg) Height:  5\' 8"  (172.7 cm)  BEHAVIORAL SYMPTOMS/MOOD NEUROLOGICAL BOWEL NUTRITION STATUS      Continent Diet (Dys 2 nectar thick liquids)  AMBULATORY STATUS COMMUNICATION OF NEEDS Skin   Extensive Assist Verbally Normal                       Personal Care Assistance Level of Assistance  Bathing, Feeding, Dressing Bathing Assistance: Maximum assistance Feeding assistance: Limited assistance Dressing Assistance: Maximum assistance     Functional Limitations Info  Sight, Speech Sight Info: Impaired   Speech Info: Impaired    SPECIAL CARE FACTORS FREQUENCY  PT (By licensed PT), OT (By licensed OT), Speech therapy, Bowel and bladder program  PT Frequency: 5 x week OT Frequency: 5 x week Bowel and Bladder Program Frequency: timed tolieting   Speech Therapy Frequency: 5 x week      Contractures Contractures Info: Not present    Additional Factors Info  Code Status, Allergies Code Status Info: Full code Allergies Info: Altace-ramipril, Ambien-zolpidem tartrate           Current Medications (11/20/2016):  This is the current hospital active  medication list Current Facility-Administered Medications  Medication Dose Route Frequency Provider Last Rate Last Dose  . acetaminophen (TYLENOL) tablet 325-650 mg  325-650 mg Oral Q4H PRN Bary Leriche, PA-C   650 mg at 11/19/16 1020  . alum & mag hydroxide-simeth (MAALOX/MYLANTA) 200-200-20 MG/5ML suspension 30 mL  30 mL Oral Q4H PRN Ivan Anchors Love, PA-C      . amLODipine (NORVASC) tablet 10 mg  10 mg Oral QPC supper Ankit Lorie Phenix, MD   10 mg at 11/19/16 1838  . aspirin suppository 300 mg  300 mg Rectal Daily Pamela S Love, PA-C       Or  . aspirin tablet 325 mg  325 mg Oral Daily Bary Leriche, PA-C   325 mg at 11/20/16 0857  . atorvastatin (LIPITOR) tablet 80 mg  80 mg Oral q1800 Bary Leriche, PA-C   80 mg at 11/19/16 [id number]  . baclofen (LIORESAL) 10 mg/mL oral suspension 2.5 mg  2.5 mg Oral QID PRN Bary Leriche, PA-C   2.5 mg at 11/17/16 [id number]  . bisacodyl (DULCOLAX) suppository 10 mg  10 mg Rectal Daily PRN Bary Leriche, PA-C   10 mg at 11/03/16 1836  . calcium-vitamin D (OSCAL WITH D) 500-200 MG-UNIT per tablet 1 tablet  1 tablet Oral Daily Bary Leriche, PA-C   1 tablet at 11/20/16 0859  . chlorhexidine (PERIDEX) 0.12 % solution 15 mL  15 mL Mouth Rinse BID Bary Leriche, PA-C   15 mL at 11/20/16 0859  . clopidogrel (PLAVIX) tablet 75 mg  75 mg Oral Daily Bary Leriche, PA-C   75 mg at 11/20/16 0857  . diphenhydrAMINE (BENADRYL) 12.5 MG/5ML elixir 12.5-25 mg  12.5-25 mg Oral Q6H PRN Ivan Anchors Love, PA-C      . diphenhydrAMINE (BENADRYL) capsule 25 mg  25 mg Oral QHS PRN Ivan Anchors Love, PA-C      . enoxaparin (LOVENOX) injection 40 mg  40 mg Subcutaneous Q24H Pamela S Love, PA-C   40 mg at 11/20/16 1304  . famotidine (PEPCID) tablet 20 mg  20 mg Oral BID Bary Leriche, PA-C   20 mg at 11/20/16 0859  . feeding supplement (PRO-STAT SUGAR FREE 64) liquid 30 mL  30 mL Oral BID Ivan Anchors Love, PA-C   30 mL at 11/20/16 0859  . finasteride (PROSCAR) tablet 5 mg  5 mg Oral Daily Bary Leriche,  PA-C   Stopped at 11/20/16 [phone]  . FLUoxetine (PROZAC) capsule 20 mg  20 mg Oral Daily Ankit Lorie Phenix, MD   20 mg at 11/20/16 0859  . food thickener (THICK IT) powder   Oral PRN Ivan Anchors Love, PA-C      . guaiFENesin-dextromethorphan (ROBITUSSIN DM) 100-10 MG/5ML syrup 5-10 mL  5-10 mL Oral Q6H PRN Ivan Anchors Love, PA-C      . hydrALAZINE (APRESOLINE) tablet 50 mg  50 mg Oral Q8H Ankit Lorie Phenix, MD   50 mg at 11/20/16 1303  . lidocaine (XYLOCAINE) 2 % jelly   Topical  PRN Bary Leriche, PA-C      . multivitamin with minerals tablet 1 tablet  1 tablet Oral Daily Bary Leriche, PA-C   1 tablet at 11/20/16 0859  . potassium chloride SA (K-DUR,KLOR-CON) CR tablet 20 mEq  20 mEq Oral BID Ankit Lorie Phenix, MD   20 mEq at 11/20/16 1303  . prochlorperazine (COMPAZINE) tablet 5-10 mg  5-10 mg Oral Q6H PRN Bary Leriche, PA-C       Or  . prochlorperazine (COMPAZINE) injection 5-10 mg  5-10 mg Intramuscular Q6H PRN Bary Leriche, PA-C       Or  . prochlorperazine (COMPAZINE) suppository 12.5 mg  12.5 mg Rectal Q6H PRN Bary Leriche, PA-C      . senna-docusate (Senokot-S) tablet 2 tablet  2 tablet Oral TID Bary Leriche, PA-C   2 tablet at 11/20/16 1303  . sodium phosphate (FLEET) 7-19 GM/118ML enema 1 enema  1 enema Rectal Q48H Ivan Anchors Love, PA-C      . tamsulosin (FLOMAX) capsule 0.8 mg  0.8 mg Oral Daily Ivan Anchors Love, PA-C   0.8 mg at 11/20/16 0857  . traZODone (DESYREL) tablet 25-50 mg  25-50 mg Oral QHS PRN Bary Leriche, PA-C   25 mg at 11/13/16 2333     Discharge Medications: Please see discharge summary for a list of discharge medications.  Relevant Imaging Results:  Relevant Lab Results:   Additional Information getting IV's to hydrate him at night  Ivi Griffith, Gardiner Rhyme, LCSW

## 2016-11-20 NOTE — Progress Notes (Signed)
Speech Language Pathology Discharge Summary  Patient Details  Name: [name] Cook MRN: [id number] Date of Birth: 03-11-1941   Patient has met 7 of 8 long term goals.  Patient to discharge at overall  min-mod level.    Clinical Impression/Discharge Summary:  Pt has made good functional gains while inpatient and is discharging having met 7 out of 8 long term goals.  Pt's diet has been upgraded to dys 2 textures and nectar thick liquids which he is consuming with supervision verbal cues for use of swallowing precautions.  Pt is also consuming small cup sips of water in between meals following oral care per the water protocol to continue working towards liquids progression.  Pt needs min-mod assist during basic, familiar tasks due to moderate cognitive impairment s/p right brain dysfunction characterized by left inattention, decreased emergent awareness of deficits, decreased functional problem solving, and decreased sustained attention to tasks.  Pt and family education is complete for this venue of care but pt and family would benefit from ongoing education at SNF prior to discharge home due to the extent of his impairments.  Recommend ongoing ST follow up at next level of care to continue to address cognitive impairments and dysphagia.    Care Partner:  Caregiver Able to Provide Assistance:  (SNF)  Type of Caregiver Assistance:  (SNF)  Recommendation:  Skilled Nursing facility;24 hour supervision/assistance  Rationale for SLP Follow Up: Maximize functional communication;Maximize cognitive function and independence;Maximize swallowing safety;Reduce caregiver burden   Equipment: thickener    Reasons for discharge: Discharged from hospital   Patient/Family Agrees with Progress Made and Goals Achieved: Yes   Function:  Eating Eating   Modified Consistency Diet: Yes Eating Assist Level: Supervision or verbal cues;Helper checks for pocketed food;Set up assist for   Eating Set Up Assist  For: Opening containers       Cognition Comprehension Comprehension assist level: Follows basic conversation/direction with extra time/assistive device  Expression   Expression assist level: Expresses basic needs/ideas: With extra time/assistive device  Social Interaction Social Interaction assist level: Interacts appropriately 75 - 89% of the time - Needs redirection for appropriate language or to initiate interaction.  Problem Solving Problem solving assist level: Solves basic 50 - 74% of the time/requires cueing 25 - 49% of the time  Memory Memory assist level: Recognizes or recalls 50 - 74% of the time/requires cueing 25 - 49% of the time   Emilio Math 11/21/2016, 12:13 PM

## 2016-11-20 NOTE — Patient Care Conference (Signed)
Inpatient RehabilitationTeam Conference and Plan of Care Update Date: 11/20/2016   Time: 10:25 AM    Patient Name: [name] Cook      Medical Record Number: [id number]  Date of Birth: 07-17-1941 Sex: Male         Room/Bed: 4W22C/4W22C-01 Payor Info: Payor: MEDICARE / Plan: MEDICARE PART A AND B / Product Type: *No Product type* /    Admitting Diagnosis: R CVA  Admit Date/Time:  10/30/2016  6:32 PM Admission Comments: No comment available   Primary Diagnosis:  <principal problem not specified> Principal Problem: <principal problem not specified>  Patient Active Problem List   Diagnosis Date Noted  . Acute blood loss anemia   . Left hemiparesis ([place])   . Dysarthria, post-stroke   . Slow transit constipation   . Hiccup   . Dysphagia   . Hyponatremia   . AKI (acute kidney injury) ([place])   . Labile blood pressure   . Hypernatremia   . Hypertensive urgency   . 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 Occlusion and stenosis of carotid artery   . Right basal ganglia embolic stroke ([place]) [id number]  . Cerebral infarction due to stenosis of right carotid artery ([place])   . Gastroesophageal reflux disease without esophagitis   . S/P BKA (below knee amputation) unilateral, right ([place])   . Benign essential HTN   . Hiccups   . 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 Cerebral infarction ([place])   . Hypertensive crisis   . Acute lower UTI   . Stenosis of carotid artery   . Prediabetes   . CVA (cerebral vascular accident) ([place]) 10/28/2016  . Bilateral carotid artery stenosis   . Left-sided weakness 10/27/2016  . Ischemic stroke ([place]) 10/27/2016  . Acute ischemic stroke ([place]) 10/27/2016  . NSTEMI (non-ST elevated myocardial infarction) ([place]) 10/12/2014  . Hx of right BKA ([place]) 10/12/2014  . 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 Atherosclerosis of native arteries of  extremity with intermittent claudication ([place]) 12/08/2013  . Diabetes mellitus with circulatory complication ([place])   . Essential hypertension, benign 07/17/2013  . Hypokalemia 07/17/2013  . Non-ST elevation myocardial infarction (NSTEMI), initial episode of care ([place]) 07/16/2013  . GERD (gastroesophageal reflux disease) 03/15/2013  . Peripheral vascular disease ([place]) 03/03/2013  . CAROTID BRUIT 02/06/2011  . Dyslipidemia 10/31/2009  . Essential hypertension 10/31/2009  . Coronary atherosclerosis 10/31/2009    Expected Discharge Date: Expected Discharge Date: 11/20/16  Team Members Present: Physician leading conference: Dr. Delice Lesch Social Worker Present: Ovidio Kin, LCSW Nurse Present: Dorien Chihuahua, RN PT Present: Carney Living, PT OT Present: Willeen Cass, OT SLP Present: Windell Moulding, SLP PPS Coordinator present : Daiva Nakayama, RN, CRRN     Current Status/Progress Goal Weekly Team Focus  Medical   Left hemiplegia, dysarthria, dysphagia secondary to right basal ganglia infarct with history of right BKA  Improve awareness, diet, cognition, safety, diet  See above   Bowel/Bladder   Pt continent of bowel; LBM 11/19/2016. Condom cath for voids  Pt will manage bladder on personalized bladder program  nursing staff to toilet pt Q2HR   Swallow/Nutrition/ Hydration   Upgraded to dys 2, nectar thick liquids with the water protocol in between meals   min assist with least restrictive diet   toleration of upgrade and carryover of compensatory strategies    ADL's     max  for ADL's-plus 2 assist, making improvements in sitting balance and trunk control   mod/max     Mobility   S-max A sitting balance, mod A to L and +2A to R slide board transfer  mod-max A  functional mobility, attention, awareness, sitting balance, pt/fam education   Communication   d/c to allow focus on cognitive goals, dysarthria not limiting to pt's functional communication         Safety/Cognition/  Behavioral Observations  improved attention to basic tasks, mod-max assist   mod assist  sustained/selective attention to tasks, intellectual/emergent awareness of tasks, basic problem solving.    Pain   pt denies pain  <3  Monitor for nonverbal pain indicators   Skin   Left buttock red, but blanchable; signficant edema to L hand; edema to L foot; R BKA with abrasion to R knee r/t prosthesis  pt will be free from additional skin breakdown  Monitor for signs of decline in skin integrity      *See Care Plan and progress notes for long and short-term goals.  Barriers to Discharge: Mobility, transfers, safety, awareness, AKI    Possible Resolutions to Barriers:  Therapies, IVF d/ced, diet advanced, follow labs    Discharge Planning/Teaching Needs:  Pursuing NHP awaiting MBS today to see if can advance diet and DC IV's at night      Team Discussion:  Making slow progress in therapies. More alert and sitting balance much improved, along with his attention. Diet upgraded to Dys2 nectar thick liquids, so IV's at night can be discharged. Still requires max assist but making slow gains. Pt and wife aware will begin the NH process.  Revisions to Treatment Plan:  Upgraded diet to nectar thick liquids.   Continued Need for Acute Rehabilitation Level of Care: The patient requires daily medical management by a physician with specialized training in physical medicine and rehabilitation for the following conditions: Daily direction of a multidisciplinary physical rehabilitation program to ensure safe treatment while eliciting the highest outcome that is of practical value to the patient.: Yes Daily medical management of patient stability for increased activity during participation in an intensive rehabilitation regime.: Yes Daily analysis of laboratory values and/or radiology reports with any subsequent need for medication adjustment of medical intervention for : Neurological problems;Other  [name] 11/20/2016, 2:46 PM

## 2016-11-20 NOTE — Plan of Care (Signed)
Problem: RH Expression Communication Goal: LTG Patient will increase speech intelligibility (SLP) LTG: Patient will increase speech intelligibility at word/phrase/conversation level with cues, % of the time (SLP)  Outcome: Not Applicable Date Met: 40/98/11 Discharged to allow focus on cognitive goals, dysarthria not limiting to functional communication

## 2016-11-20 NOTE — Plan of Care (Signed)
Problem: RH Bed Mobility Goal: LTG Patient will perform bed mobility with assist (PT) LTG: Patient will perform bed mobility with assistance, with/without cues (PT).  Outcome: Adequate for Discharge +2A for sit > supine to L, mod A to R  Problem: RH Bed to Chair Transfers Goal: LTG Patient will perform bed/chair transfers w/assist (PT) LTG: Patient will perform bed/chair transfers with assistance, with/without cues (PT).  Outcome: Adequate for Discharge Mod A to L and +2A to R due to pushing tendencies

## 2016-11-20 NOTE — Progress Notes (Signed)
[facility]     PROGRESS NOTE    Subjective/Complaints: Pt sitting up in bed this AM, eating breakfast.  Wife at bedside.  Pt slept well overnight.  He is alert and has not dropped any food on himself.  He wants to know when he can go home.   ROS: Denies CP, SOB, nausea, vomiting, diarrhea  Objective: Vital Signs: Blood pressure (!) 148/62, pulse 78, temperature 98.3 F (36.8 C), temperature source Oral, resp. rate 16, height 5\' 8"  (1.727 m), weight 85.9 kg (189 lb 6.4 oz), SpO2 99 %. No results found.  Recent Labs  11/20/16 0742  WBC 6.6  HGB 11.4*  HCT 32.9*  PLT 263    Recent Labs  11/20/16 0742  NA 137  K 3.4*  CL 107  GLUCOSE 114*  BUN 13  CREATININE 0.68  CALCIUM 8.0*   CBG (last 3)  No results for input(s): GLUCAP in the last 72 hours.  Wt Readings from Last 3 Encounters:  11/13/16 85.9 kg (189 lb 6.4 oz)  10/28/16 92 kg (202 lb 12.8 oz)  09/20/16 95.8 kg (211 lb 4 oz)    Physical Exam:  Constitutional: NAD. Vital signs reviewed.   HENT: Normocephalic and atraumatic.  Eyes: EOM are normal. No discharge.  Cardiovascular: RRR, +murmur. No JVD. Respiratory: Clear. Unlabored.  GI: Soft. Bowel sounds are normal. Musculoskeletal: He exhibits no edema. He exhibits no tenderness.  RLE BKA healed Neurological: He is alert.  Alert and oriented x3. Dysarthria Left facial weakness (stable) Motor: LUE 0/5. mAS 1/4 with finger flexors (unchanged) LLE: HF 2/5, HE 2+/5, KE 1+/5, ADF 1+/5  RUE: 5/5 proximal to distal RLE: HF 5/5. Skin: Skin is warm and dry and intact.  Psychiatric: Flat.   Assessment/Plan: 1. Functional deficits secondary to right basal ganglia infarct which require 3+ hours per day of interdisciplinary therapy in a comprehensive inpatient rehab setting. Physiatrist is providing close team supervision and 24 hour management of active medical problems listed below. Physiatrist and rehab team continue to  assess barriers to discharge/monitor patient progress toward functional and medical goals.  Function:  Bathing Bathing position   Position: Bed  Bathing parts Body parts bathed by patient: Left arm, Chest, Abdomen, Front perineal area, Right upper leg, Left upper leg Body parts bathed by helper: Left lower leg, Buttocks, Right arm, Back  Bathing assist Assist Level: 2 helpers      Upper Body Dressing/Undressing Upper body dressing   What is the patient wearing?: Pull over shirt/dress     Pull over shirt/dress - Perfomed by patient: Thread/unthread right sleeve, Put head through opening Pull over shirt/dress - Perfomed by helper: Pull shirt over trunk, Thread/unthread left sleeve        Upper body assist Assist Level: Touching or steadying assistance(Pt > 75%)      Lower Body Dressing/Undressing Lower body dressing   What is the patient wearing?: Pants, AFO, Socks, Shoes     Pants- Performed by patient: Thread/unthread right pants leg Pants- Performed by helper: Thread/unthread right pants leg, Pull pants up/down     Socks - Performed by patient: Don/doff right sock Socks - Performed by helper: Don/doff left sock   Shoes - Performed by helper: Don/doff left shoe   AFO - Performed by helper: Don/doff right AFO      Lower body assist Assist for lower body dressing:  (Total assist)      Toileting Toileting Toileting activity did not occur: No continent  bowel/bladder event   Toileting steps completed by helper: Adjust clothing prior to toileting, Performs perineal hygiene, Adjust clothing after toileting Toileting Assistive Devices: Toilet aid  Toileting assist Assist level: Two helpers   Transfers Chair/bed transfer   Chair/bed transfer method: Lateral scoot Chair/bed transfer assist level: 2 helpers (mod A to L, +2A to R) Chair/bed transfer assistive device: Sliding board, Armrests, Prosthesis Mechanical lift: Maximove   Locomotion Ambulation Ambulation  activity did not occur: Safety/medical concerns         Wheelchair   Type: Manual Max wheelchair distance: 40 Assist Level: Moderate assistance (Pt 50 - 74%), Maximal assistance (Pt 25 - 49%)  Cognition Comprehension Comprehension assist level: Follows complex conversation/direction with no assist  Expression Expression assist level: Expresses basic needs/ideas: With extra time/assistive device  Social Interaction Social Interaction assist level: Interacts appropriately 50 - 74% of the time - May be physically or verbally inappropriate.  Problem Solving Problem solving assist level: Solves basic 25 - 49% of the time - needs direction more than half the time to initiate, plan or complete simple activities  Memory Memory assist level: Recognizes or recalls 50 - 74% of the time/requires cueing 25 - 49% of the time   Medical Problem List and Plan: 1.  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  DVT Prophylaxis/Anticoagulation: Pharmaceutical: Lovenox 3. Pain Management: tylenol prn. Continue to educate patient family on LUE positioning.  4. Mood: LCSW to follow for evaluation and support.  5. Neuropsych: This patient is not fully capable of making decisions on his own behalf. 6. Skin/Wound Care: routine pressure relief measures 7. Fluids/Electrolytes/Nutrition: Monitor I/O.   D1 Honey, advanced to D2 Honey  Monitor hydration on honey liquids- concerns of adequate intake with ongoing hiccups and coughing due to difficulty with secretions.  8. HTN: Monitor BP. Goal of BP 130-150--avoid hypotension  Lisinopril 2.5 started 12/11, increased to 7.5 on 12/12, increased to 10 on 12/13, d/ced on 12/16.    Norvasc 5mg  started 12/12, increased to 10 on 12/16  Hydralazine 25 TID started 12/16, increased to 50 on 12/18  Within goal range  for the most part on 12/27  Will cont to monitor Vitals:   11/19/16 1403 11/20/16 0527  BP: (!) 150/61 (!) 148/62  Pulse: 80 78  Resp: 18 16  Temp: 98.4 F (36.9 C) 98.3 F (36.8 C)   9. 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  11. Hiccups:   Baclofen increased to 10mg  QID monitor for sedation, ?resolved, weaned to 5mg  on 12/13, decreased to BID on 12/18, d/ced 12/21 12. GERD: On Zantac bid. 13. Pre-diabtes: Hgb A1c- 5.9.  Stable 12/21, d/ced CBGs 14. Mild hypokalemia:  K+ 3.4 on 12/20   Supplement increased 12.27  Will cont to monitor 15. AKI: Resolved  Dehydration +/- ACE  Started IVF at night, ACE d/ced, will d/c IVF and monitor  Cr 0.68 on 12/27 16. Hypernatremia: Resolved  Cont to monitor 17. Constipation  Increased reg on 12/22  Improving 18. ABLA  Hb 11.4 on 12/27  Cont to monitor   LOS (Days) 21 A FACE TO FACE EVALUATION WAS PERFORMED  Rickelle Sylvestre Lorie Phenix 11/20/2016 9:26 AM

## 2016-11-20 NOTE — Progress Notes (Signed)
Social Work [name], LCSW Social Worker Signed   Patient Care Conference Date of Service: 11/20/2016 12:32 PM      Hide copied text Hover for attribution information Inpatient RehabilitationTeam Conference and Plan of Care Update Date: 11/20/2016   Time: 10:25 AM      Patient Name: Bless Canino      Medical Record Number: [id number]  Date of Birth: 1941/03/03 Sex: Male         Room/Bed: 4W22C/4W22C-01 Payor Info: Payor: MEDICARE / Plan: MEDICARE PART A AND B / Product Type: *No Product type* /     Admitting Diagnosis: R CVA  Admit Date/Time:  10/30/2016  6:32 PM Admission Comments: No comment available    Primary Diagnosis:  <principal problem not specified> Principal Problem: <principal problem not specified>       Patient Active Problem List    Diagnosis Date Noted  . Acute blood loss anemia    . Left hemiparesis ([place])    . Dysarthria, post-stroke    . Slow transit constipation    . Hiccup    . Dysphagia    . Hyponatremia    . AKI (acute kidney injury) ([place])    . Labile blood pressure    . Hypernatremia    . Hypertensive urgency    . 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 Occlusion and stenosis of carotid artery    . Right basal ganglia embolic stroke ([place]) [id number]  . Cerebral infarction due to stenosis of right carotid artery ([place])    . Gastroesophageal reflux disease without esophagitis    . S/P BKA (below knee amputation) unilateral, right ([place])    . Benign essential HTN    . Hiccups    . 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 Cerebral infarction ([place])    . Hypertensive crisis    . Acute lower UTI    . Stenosis of carotid artery    . Prediabetes    . CVA (cerebral vascular accident) ([place]) 10/28/2016  . Bilateral carotid artery stenosis    . Left-sided weakness 10/27/2016  . Ischemic stroke ([place]) 10/27/2016  . Acute ischemic stroke ([place]) 10/27/2016  . NSTEMI  (non-ST elevated myocardial infarction) ([place]) 10/12/2014  . Hx of right BKA ([place]) 10/12/2014  . 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 Atherosclerosis of native arteries of extremity with intermittent claudication ([place]) 12/08/2013  . Diabetes mellitus with circulatory complication ([place])    . Essential hypertension, benign 07/17/2013  . Hypokalemia 07/17/2013  . Non-ST elevation myocardial infarction (NSTEMI), initial episode of care ([place]) 07/16/2013  . GERD (gastroesophageal reflux disease) 03/15/2013  . Peripheral vascular disease ([place]) 03/03/2013  . CAROTID BRUIT 02/06/2011  . Dyslipidemia 10/31/2009  . Essential hypertension 10/31/2009  . Coronary atherosclerosis 10/31/2009      Expected Discharge Date: Expected Discharge Date: 11/20/16   Team Members Present: Physician leading conference: Dr. Delice Lesch Social Worker Present: Ovidio Kin, LCSW Nurse Present: Dorien Chihuahua, RN PT Present: Carney Living, PT OT Present: Willeen Cass, OT SLP Present: Windell Moulding, SLP PPS Coordinator present : Daiva Nakayama, RN, CRRN       Current Status/Progress Goal Weekly Team Focus  Medical   Left hemiplegia, dysarthria, dysphagia secondary to right basal ganglia infarct with history of right BKA  Improve awareness, diet, cognition, safety, diet  See above   Bowel/Bladder  Pt continent of bowel; LBM 11/19/2016. Condom cath for voids  Pt will manage bladder on personalized bladder program  nursing staff to toilet pt Q2HR   Swallow/Nutrition/ Hydration   Upgraded to dys 2, nectar thick liquids with the water protocol in between meals   min assist with least restrictive diet   toleration of upgrade and carryover of compensatory strategies    ADL's     max for ADL's-plus 2 assist, making improvements in sitting balance and trunk control   mod/max     Mobility   S-max A sitting balance, mod A to L and +2A to R slide board transfer  mod-max A  functional  mobility, attention, awareness, sitting balance, pt/fam education   Communication   d/c to allow focus on cognitive goals, dysarthria not limiting to pt's functional communication         Safety/Cognition/ Behavioral Observations improved attention to basic tasks, mod-max assist   mod assist  sustained/selective attention to tasks, intellectual/emergent awareness of tasks, basic problem solving.    Pain   pt denies pain  <3  Monitor for nonverbal pain indicators   Skin   Left buttock red, but blanchable; signficant edema to L hand; edema to L foot; R BKA with abrasion to R knee r/t prosthesis  pt will be free from additional skin breakdown  Monitor for signs of decline in skin integrity       *See Care Plan and progress notes for long and short-term goals.   Barriers to Discharge: Mobility, transfers, safety, awareness, AKI   Possible Resolutions to Barriers:  Therapies, IVF d/ced, diet advanced, follow labs   Discharge Planning/Teaching Needs:  Pursuing NHP awaiting MBS today to see if can advance diet and DC IV's at night      Team Discussion:  Making slow progress in therapies. More alert and sitting balance much improved, along with his attention. Diet upgraded to Dys2 nectar thick liquids, so IV's at night can be discharged. Still requires max assist but making slow gains. Pt and wife aware will begin the NH process.  Revisions to Treatment Plan:  Upgraded diet to nectar thick liquids.    Continued Need for Acute Rehabilitation Level of Care: The patient requires daily medical management by a physician with specialized training in physical medicine and rehabilitation for the following conditions: Daily direction of a multidisciplinary physical rehabilitation program to ensure safe treatment while eliciting the highest outcome that is of practical value to the patient.: Yes Daily medical management of patient stability for increased activity during participation in an intensive  rehabilitation regime.: Yes Daily analysis of laboratory values and/or radiology reports with any subsequent need for medication adjustment of medical intervention for : Neurological problems;Other   [name] 11/20/2016, 2:46 PM      [name], LCSW Social Worker Signed   Patient Care Conference Date of Service: 11/13/2016  3:49 PM      Hide copied text Hover for attribution information Inpatient RehabilitationTeam Conference and Plan of Care Update Date: 11/13/2016   Time: 2:20 PM      Patient Name: Aithen Bein      Medical Record Number: [id number]  Date of Birth: 12/08/1940 Sex: Male         Room/Bed: 4W22C/4W22C-01 Payor Info: Payor: MEDICARE / Plan: MEDICARE PART A AND B / Product Type: *No Product type* /     Admitting Diagnosis: R CVA  Admit Date/Time:  10/30/2016  6:32 PM Admission Comments: No comment available  Primary Diagnosis:  <principal problem not specified> Principal Problem: <principal problem not specified>       Patient Active Problem List    Diagnosis Date Noted  . Dysphagia    . Hyponatremia    . AKI (acute kidney injury) ([place])    . Labile blood pressure    . Hypernatremia    . Hypertensive urgency    . 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 Occlusion and stenosis of carotid artery    . Right basal ganglia embolic stroke ([place]) Q000111Q  . Cerebral infarction due to stenosis of right carotid artery ([place])    . Gastroesophageal reflux disease without esophagitis    . S/P BKA (below knee amputation) unilateral, right ([place])    . Benign essential HTN    . Hiccups    . 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 Cerebral infarction ([place])    . Hypertensive crisis    . Acute lower UTI    . Stenosis of carotid artery    . Prediabetes    . CVA (cerebral vascular accident) ([place]) 10/28/2016  . Bilateral carotid artery stenosis    . Left-sided weakness  10/27/2016  . Ischemic stroke ([place]) 10/27/2016  . Acute ischemic stroke ([place]) 10/27/2016  . NSTEMI (non-ST elevated myocardial infarction) ([place]) 10/12/2014  . Hx of right BKA ([place]) 10/12/2014  . 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 Atherosclerosis of native arteries of extremity with intermittent claudication ([place]) 12/08/2013  . Diabetes mellitus with circulatory complication ([place])    . Essential hypertension, benign 07/17/2013  . Hypokalemia 07/17/2013  . Non-ST elevation myocardial infarction (NSTEMI), initial episode of care ([place]) 07/16/2013  . GERD (gastroesophageal reflux disease) 03/15/2013  . Peripheral vascular disease ([place]) 03/03/2013  . CAROTID BRUIT 02/06/2011  . Dyslipidemia 10/31/2009  . Essential hypertension 10/31/2009  . Coronary atherosclerosis 10/31/2009      Expected Discharge Date: Expected Discharge Date: 11/20/16   Team Members Present: Physician leading conference: Dr. Delice Lesch Social Worker Present: Ovidio Kin, LCSW Nurse Present: Heather Roberts, RN PT Present: Jorge Mandril, PT OT Present: Other (comment) Grayland Ormond Doe-OT) SLP Present: Windell Moulding, SLP       Current Status/Progress Goal Weekly Team Focus  Medical   Left hemiplegia, dysarthria, dysphagia secondary to right basal ganglia infarct with history of right BKA  Improve awareness, cognitive, safety, BP, AKI  See above   Bowel/Bladder   incontinent of bowel and bladder; uses condom cath. LBM 11/12/16. Has small smears as well.   Maintain time toileting, usage of condom cath  monitor for changes in B/B status   Swallow/Nutrition/ Hydration   IV fluids 7p-7a; poor appetite on honey thick fluids  min assist with least restrictive diet   trials of advanced textures to continue working towards repeat objective assessment    ADL's   dependent  Overall Mod A  attention, awareness, NMR, pt/family ed, modified bathing/dressing, sitting balance, functional  transfers   Mobility   maxi move  mod A  functional mobiliy, attention, awareness, patient/family education, balance   Communication             Safety/Cognition/ Behavioral Observations follows commands  mod assist   continue to address basic cognition    Pain   no pain   maintain pain level under 2 on 0-10 scale  manage pain as needed with pharmacological  and non-pharmacological measures   Skin   no skin issues, hx right BKA  pt will maintain skin integrity  monitor for skin breakdown       *See Care Plan and progress notes for long and short-term goals.   Barriers to Discharge: Mobility, transfers, safety, awareness, HTN, AKI   Possible Resolutions to Barriers:  Therapies, follow labs, IVF, optimize BP meds   Discharge Planning/Teaching Needs:  per CN / conference pt. is going for NHP [facility]      Team Discussion:  Making slow progress with attention and carry over. Still requiring plus 2 assist-total assist. Upgraded to Dys 2 honey thick diet, MBS next week. Still pushes to the left. Still receiving IV's at night for hydration. Family not able to provide this amount of care at home. Begin looking for NH bed  Revisions to Treatment Plan:  NHP    Continued Need for Acute Rehabilitation Level of Care: The patient requires daily medical management by a physician with specialized training in physical medicine and rehabilitation for the following conditions: Daily direction of a multidisciplinary physical rehabilitation program to ensure safe treatment while eliciting the highest outcome that is of practical value to the patient.: Yes Daily medical management of patient stability for increased activity during participation in an intensive rehabilitation regime.: Yes Daily analysis of laboratory values and/or radiology reports with any subsequent need for medication adjustment of medical intervention for : Neurological problems;Blood pressure problems;Other   [name] 11/14/2016,  8:54 AM      [name], LCSW Social Worker Signed   Patient Care Conference Date of Service: 11/07/2016  9:08 AM      Hide copied text Hover for attribution information Inpatient RehabilitationTeam Conference and Plan of Care Update Date: 11/06/2016   Time: 2:20 PM      Patient Name: Zymir Englehart      Medical Record Number: [id number]  Date of Birth: January 03, 1941 Sex: Male         Room/Bed: 4W22C/4W22C-01 Payor Info: Payor: MEDICARE / Plan: MEDICARE PART A AND B / Product Type: *No Product type* /     Admitting Diagnosis: R CVA  Admit Date/Time:  10/30/2016  6:32 PM Admission Comments: No comment available    Primary Diagnosis:  <principal problem not specified> Principal Problem: <principal problem not specified>       Patient Active Problem List    Diagnosis Date Noted  . Hypernatremia    . Hypertensive urgency    . 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 Occlusion and stenosis of carotid artery    . Right basal ganglia embolic stroke ([place]) Q000111Q  . Cerebral infarction due to stenosis of right carotid artery ([place])    . Gastroesophageal reflux disease without esophagitis    . S/P BKA (below knee amputation) unilateral, right ([place])    . Benign essential HTN    . Hiccups    . 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 Cerebral infarction ([place])    . Hypertensive crisis    . Acute lower UTI    . Stenosis of carotid artery    . Prediabetes    . CVA (cerebral vascular accident) ([place]) 10/28/2016  . Bilateral carotid artery stenosis    . Left-sided weakness 10/27/2016  . Ischemic stroke ([place]) 10/27/2016  . Acute ischemic stroke ([place]) 10/27/2016  . NSTEMI (non-ST elevated myocardial infarction) ([place]) 10/12/2014  .  Hx of right BKA ([place]) 10/12/2014  . 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 Atherosclerosis of native arteries of extremity with  intermittent claudication ([place]) 12/08/2013  . Diabetes mellitus with circulatory complication ([place])    . Essential hypertension, benign 07/17/2013  . Hypokalemia 07/17/2013  . Non-ST elevation myocardial infarction (NSTEMI), initial episode of care ([place]) 07/16/2013  . GERD (gastroesophageal reflux disease) 03/15/2013  . Peripheral vascular disease ([place]) 03/03/2013  . CAROTID BRUIT 02/06/2011  . Dyslipidemia 10/31/2009  . Essential hypertension 10/31/2009  . Coronary atherosclerosis 10/31/2009      Expected Discharge Date: Expected Discharge Date: 11/20/16   Team Members Present: Physician leading conference: Dr. Delice Lesch Social Worker Present: Ovidio Kin, LCSW Nurse Present: Dorthula Nettles, RN PT Present: Carney Living, PT OT Present: Other (comment) Grayland Ormond Doe-OT) SLP Present: Windell Moulding, SLP PPS Coordinator present : Daiva Nakayama, RN, CRRN       Current Status/Progress Goal Weekly Team Focus  Medical   Left hemiplegia, dysarthria, dysphagia secondary to right basal ganglia infarct with history of right BKA  Improve awareness, attention HTN, electrolyte abnormalities  See above   Bowel/Bladder   incont of B/B. LBM 11/04/16  time toileting with mod assist  monitor for changes in B/B status   Swallow/Nutrition/ Hydration   Dys 1, honey thick liquids   min assist with least restrictive diet   trials of advanced textures per improved attention to boluses   ADL's   Max A-Total A+2  Overall Mod A  attention, awareness, NMR, pt/family ed, modified bathing/dressing sitting balance, functional transfers   Mobility   max to total +2  min-mod A overall w/c level  functional transfers, L attention, L NMR, sitting balance, standing tolerance in standing frame, attention, awareness, pt/fam education   Communication   dysarthric   mod assist   education and carryover of compensatory strategies per improved mentation    Safety/Cognition/ Behavioral Observations max assist   mod assist    basic cognition, visual scanning to the left of midline    Pain   pt denies pain  <2  manage pain with pharmacological and non-pharmacological measures   Skin   No current skin issues; Hx R BKA  pt will maintain skin integrity  monitor for signs of skin breakdown       *See Care Plan and progress notes for long and short-term goals.   Barriers to Discharge: Mobility, transfers, safety, awareness, electrolyte abnormalities, hypertensive urgency   Possible Resolutions to Barriers:  Therapies, follow labs, optimize BP meds   Discharge Planning/Teaching Needs:  Family wants to take him home but will need a people who can provide physical care due to wife is not able too. Will work on a realistic discharge plan.      Team Discussion:  Patient has severe deficits-currently total assist plus 2 with sitting balance and trunk control. Goals mod-assist wheelchair level. No awareness or attention to his right side. Dys 2 honey thick liquids, MD starting IV fluids. Incont B & B. Would require much care by wife and family question if they can provide this level of care.  Revisions to Treatment Plan:  Home versus NHP    Continued Need for Acute Rehabilitation Level of Care: The patient requires daily medical management by a physician with specialized training in physical medicine and rehabilitation for the following conditions: Daily direction of a multidisciplinary physical rehabilitation program to ensure safe treatment while eliciting the highest outcome that is  of practical value to the patient.: Yes Daily medical management of patient stability for increased activity during participation in an intensive rehabilitation regime.: Yes Daily analysis of laboratory values and/or radiology reports with any subsequent need for medication adjustment of medical intervention for : Post surgical problems;Neurological problems;Blood pressure problems   [name] 11/07/2016, 9:08 AM       Patient  ID: Lamont Dowdy, male   DOB: [birth year], 75 y.o.   MRN: [id number]

## 2016-11-20 NOTE — Progress Notes (Signed)
Physical Therapy Discharge Summary  Patient Details  Name: [name] Cook MRN: [id number] Date of Birth: 04/07/1941  Today's Date: 11/20/2016 PT Individual Time: 1415-1600 PT Individual Time Calculation (min): 105 min  Patient has met 4 of 6 long term goals due to improved activity tolerance, improved balance, improved postural control, increased strength, ability to compensate for deficits, improved attention and improved awareness.  Patient to discharge at a wheelchair level Mod Assist to total assist x 2.   Patient's care partner unable to provide the necessary physical and cognitive assistance at discharge, therefore discharge planned to SNF for further rehab.  Reasons goals not met: Patient continues to require +2A for slide board transfers to R due to pushing L and for bed mobility to L due to LUE hemiplegia.   Recommendation:  Patient will benefit from ongoing skilled PT services in skilled nursing facility setting to continue to advance safe functional mobility, address ongoing impairments in muscle weakness, cardiorespiratory endurance, impaired timing and sequencing, abnormal tone, coordination, midline orientation, attention to left, initiation, attention, awareness, problem solving,  safety awareness, memory, sitting balance, standing balance, postural control, hemiplegia, and balance strategies and minimize fall risk.  Equipment: No equipment provided  Reasons for discharge: treatment goals met and discharge from hospital  Patient/family agrees with progress made and goals achieved: Yes  Skilled Therapeutic Intervention Patient seen in TIS wheelchair with R prosthesis donned with wife present to observe session. Focus on slide board transfer to L with mod A of one person, static sitting balance with RUE support or no UE support x 12 min with supervision and dynamic sitting balance with min A with patient able to reorient to midline and improve upright posture with trunk  extension and anterior pelvic tilt with verbal/visual cues, initiation of gait training with L GiveMohr sling donned to support painful subluxed L shoulder via 3 musketeers technique with +2A with PT advancing/stabilizing LLE total A with increased adduction and extension tone noted x 7 ft + 10 ft + 15 ft with third person following with wheelchair, initiation of stair training up/down one 3" step ascending forward and descending backward with total A x 2 due to increased pushing to L, manual wheelchair propulsion with max multimodal cues for R hemi technique with mod A overall to maintain straight path, sit <> stand with +2A and third person to switch out wheelchair for [facility] with continent bowel movement and total A for hygiene and clothing management, oral hygiene at sink from wheelchair level followed by water protocol, slide board transfer back to bed and sit > supine to L with +2A due to transferring to hemiplegic side to lower trunk and elevate BLE, patient assisting with pulling up in bed using RUE and left semi reclined with L PRAFO and L resting hand splint donned, prosthesis doffed, and bed alarm on with needs in reach. Patient demonstrating significant improvement in sustained > selective attention, L attention, and awareness.   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 Restrictions Weight Bearing Restrictions: No Pain Pain Assessment Pain Assessment: No/denies pain Vision/Perception  L inattention greatly improved from evaluation Cognition Overall Cognitive Status: Impaired/Different from baseline Arousal/Alertness: Awake/alert Orientation Level: Oriented X4 Attention: Sustained Sustained Attention: Impaired Sustained Attention Impairment: Verbal basic;Functional basic Memory: Impaired Memory Impairment: Decreased recall of new information Awareness: Impaired Awareness Impairment: Emergent  impairment Problem Solving: Impaired Problem Solving Impairment: Functional basic;Verbal basic Executive Function: Decision Making;Self Monitoring;Self Correcting;Reasoning Reasoning: Impaired Reasoning Impairment: Verbal basic;Functional  basic Decision Making: Impaired Decision Making Impairment: Verbal basic;Functional basic Initiating: Impaired Initiating Impairment: Functional basic Self Monitoring: Impaired Self Monitoring Impairment: Verbal basic;Functional basic Self Correcting: Impaired Self Correcting Impairment: Verbal basic;Functional basic Safety/Judgment: Impaired Comments: left inattention Sensation Sensation Light Touch: Appears Intact Proprioception: Impaired Detail Proprioception Impaired Details: Absent LUE;Absent LLE Coordination Gross Motor Movements are Fluid and Coordinated: No Fine Motor Movements are Fluid and Coordinated: No Coordination and Movement Description: dense L hemi with LE tone Motor  Motor Motor: Hemiplegia;Abnormal tone;Abnormal postural alignment and control Motor - Discharge Observations: improved sitting balance and midline orientation since evaluation  Mobility Bed Mobility Bed Mobility: Sit to Supine Sit to Supine: 1: +2 Total assist;HOB flat Transfers Transfers: Yes Sit to Stand: 1: +2 Total assist Lateral/Scoot Transfers: 3: Mod assist;With slide board;With armrests removed (to L) Locomotion  Ambulation Ambulation: Yes Ambulation/Gait Assistance: 1: +2 Total assist Ambulation Distance (Feet): 15 Feet Assistive device: Other (Comment) (3 musketeers) Gait Gait: Yes Gait Pattern: Impaired Gait Pattern: Trunk flexed (total A to advance and stabilize LLE) Gait velocity: decreased Stairs / Additional Locomotion Stairs: Yes Stairs Assistance: 1: +2 Total assist Stair Management Technique: One rail Right;Forwards;Backwards;Step to pattern Number of Stairs: 1 Height of Stairs: 3 Wheelchair Mobility Wheelchair Mobility:  Yes Wheelchair Assistance: 3: Mod Lexicographer: Right lower extremity;Right upper extremity Wheelchair Parts Management: Needs assistance  Trunk/Postural Assessment  Cervical Assessment Cervical Assessment: Exceptions to [facility] (laterally flexed to L ) Thoracic Assessment Thoracic Assessment: Exceptions to [facility] (rounded shoulders) Lumbar Assessment Lumbar Assessment: Exceptions to [facility] (posterior pelvic tilt) Postural Control Postural Control: Deficits on evaluation Righting Reactions: improved from evaluation but insufficient   Balance Balance Balance Assessed: Yes Static Sitting Balance Static Sitting - Balance Support: Right upper extremity supported;Feet supported;No upper extremity supported Static Sitting - Level of Assistance: 5: Stand by assistance Static Sitting - Comment/# of Minutes: 12 min Dynamic Sitting Balance Dynamic Sitting - Balance Support: Feet supported;No upper extremity supported;During functional activity Dynamic Sitting - Level of Assistance: 4: Min assist Extremity Assessment  RLE Assessment RLE Assessment: Within Functional Limits (hip and knee) LLE Assessment LLE Assessment: Exceptions to [facility] LLE Strength LLE Overall Strength: Deficits LLE Overall Strength Comments: no volitional movement noted LLE Tone LLE Tone: Hypertonic   See Function Navigator for Current Functional Status.  Farmer Mccahill, Murray Hodgkins 11/20/2016, 7:39 PM

## 2016-11-20 NOTE — Progress Notes (Signed)
Speech Language Pathology Note  Patient Details  Name: [name] Cook MRN: [id number] Date of Birth: 10/09/1941 Today's Date: 11/20/2016  MBSS complete. Full report located under chart review in imaging section.    [name], Selinda Orion 11/20/2016, 3:47 PM

## 2016-11-20 NOTE — Discharge Summary (Signed)
Physician Discharge Summary  Patient ID: [name] Cook MRN: [id number] DOB/AGE: December 19, 1940 75 y.o.  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)   Occlusion and stenosis of carotid artery   Hypernatremia   AKI (acute kidney injury) (HCC)   Dysphagia   Hiccup   Slow transit constipation   Dysarthria, post-stroke   Left hemiparesis (HCC)   Acute blood loss anemia   Discharged Condition: stable   Significant Diagnostic Studies:   Labs:  Basic Metabolic Panel: BMP Latest Ref Rng & Units 11/20/2016 11/13/2016 11/11/2016  Glucose 65 - 99 mg/dL 114(H) 131(H) 122(H)  BUN 6 - 20 mg/dL 13 20 22(H)  Creatinine 0.61 - 1.24 mg/dL 0.68 0.91 0.93  BUN/Creat Ratio 10 - 24 - - -  Sodium 135 - 145 mmol/L 137 142 144  Potassium 3.5 - 5.1 mmol/L 3.4(L) 3.5 3.7  Chloride 101 - 111 mmol/L 107 113(H) 112(H)  CO2 22 - 32 mmol/L 23 21(L) 24  Calcium 8.9 - 10.3 mg/dL 8.0(L) 8.4(L) 8.8(L)    CBC: CBC Latest Ref Rng & Units 11/20/2016 11/13/2016 11/11/2016  WBC 4.0 - 10.5 K/uL 6.6 6.8 8.1  Hemoglobin 13.0 - 17.0 g/dL 11.4(L) 12.0(L) 13.1  Hematocrit 39.0 - 52.0 % 32.9(L) 36.4(L) 39.0  Platelets 150 - 400 K/uL 263 323 359    CBG:  Recent Labs Lab 11/14/16 1138  GLUCAP 129*    Brief HPI:   [name] Summerlinis a 75 y.o.right handed malewith history of CAD status post CABG 1999, hypertension, right BKA. He was admitted on 10/27/16 with new onset right sided weakness, fall and inability to stand.  MRI brain done revealing 2 acute infarcts in the right lateral lenticulostriate distribution. CT angiogram head and neck negative for emergent large vessel occlusion with pseudoaneurysm of the proximal brachiocephalic artery, moderate to severe tandem stenosis of the right MCA mid M1 segment and at the right MCA  bifurcation as well as high-grade radiographic string sign stenosis of the left ICA.Dr. Donnetta Hutching consulted for input and did not recommend L-CEA at this time. Dr. Erlinda Hong recommends 30 day event monitor to work up cardioembolic stroke--to continue DAPT for 3 months followed by plavix alone and Long term BP goal 130-150 due to L-ICA stenosis (string sign).   Patient with resultant dense left hemiparesis, dysphagia with difficulty handling oral secretions, cognitive deficits requiring verbal and tactile cues to follow one step commands and left neglect. Therapy ongoing and CIR recommended for follow up therapy.    Hospital Course: [name] Cook was admitted to rehab 10/30/2016 for inpatient therapies to consist of PT, ST and OT at least three hours five days a week. Past admission physiatrist, therapy team and rehab RN have worked together to provide customized collaborative inpatient rehab. His blood pressures were monitored closely and medications have been slowly titrated upwards to keep between goal of 130-150. He continued to have persistent hiccups affecting sleep as well as intake.  These have resolved with use of baclofen tid which has been weaned off prior to discharge. His fluid intake has been poor due to dislike of honey thick liquids and he developed renal failure requiring IVF for hydration.  Lisinopril was discontinued and fluoxetine was added to help with stroke recovery. His renal status has improved and as dysphagia showed evidence of improvement, MBS was repeated on 12/27 and diet was  advanced to dysphagia 2, nectar liquids. He is to start water protocol between meals therefore nocturnal IVF were discontinued. Mild hypokalemia was briefly supplemented and recommend recheck of lytes in 3-5 days after discharge. Constipation has resolved with augmentation of bowel program. H/H has been monitored with serial CBC and labs today have shown a drop to 11.4. No signs of bleeding noted/reported and recommend  monitoring CBC for further drop and work up as indicated.  BS were monitored ac/hs basis for couple of weeks due to pre-diabetes and have ranged in 120-130 range on dysphagia diet.    He has been making slow progress with therapy and is currently at moderate to total assist at wheelchair level. He continues to be limited by left hemiparesis with left inattention, pusher tendencies, cognitive deficits with impaired attention for basic and familiar tasks.  Family is unable to provide assistance needed and has elected on further therapy at skilled nursing. He was discharged to [facility] on 12/28 in improved condition.    Rehab course: During patient's stay in rehab weekly team conferences were held to monitor patient's progress, set goals and discuss barriers to discharge. At admission, patient required total assist with mobility and basic self care tasks.  He displayed marked oral phase dysphagia with silent penetration of nectars and moderate to sever cognitive linguistic deficits. He has had improvement in activity tolerance, balance, postural control, as well as ability to compensate for deficits. He  is has had improvement in functional use LUE and LLE as well as improvement in awareness. He is able to complete UB dressing with min assist and requires max to total assist for bathing and LB dressing tasks. He requires + 2 assist for sliding board transfers to the right and is able to propel his wheelchair for 40' with mod to max assist.  He is requiring min assist with fewer cues for problem solving, visual scanning and working memory. He is needs min cues to maintain safe swallow strategies. He was started on water protocol and wife has been educated on safety to provide supervision.      Disposition: Skilled Nursing Facility  Diet: Dysphagia 2, Nectar thick liquids with full supervision.   Special Instructions: 1. Recheck BMET and CBC in 3-5 days. 2. Monitor stool guaiacs X 3 for any signs of  bleeding  3. Water protocol between meals to maintain adequate hydration.Must be upright for 60 minutes after meals. Medications crushed in puree.    Discharge Instructions    Ambulatory referral to Cardiology    Complete by:  As directed    Needs loop recorder due to work up  recent stroke.   Ambulatory referral to Neurology    Complete by:  As directed    An appointment is requested in approximately: 4 weeks.   Ambulatory referral to Physical Medicine Rehab    Complete by:  As directed    4 week follow up     Allergies as of 11/21/2016      Reactions   Altace [ramipril] Other (See Comments)   hypotension   Ambien [zolpidem Tartrate] Other (See Comments)   Sleep walking      Medication List    STOP taking these medications   cloNIDine 0.1 MG tablet Commonly known as:  CATAPRES   haloperidol 1 MG tablet Commonly known as:  HALDOL   insulin aspart 100 UNIT/ML injection Commonly known as:  novoLOG   lisinopril 20 MG tablet Commonly known as:  PRINIVIL,ZESTRIL   metoprolol succinate  50 MG 24 hr tablet Commonly known as:  TOPROL-XL     TAKE these medications   acetaminophen 325 MG tablet Commonly known as:  TYLENOL Take 1-2 tablets (325-650 mg total) by mouth every 4 (four) hours as needed for mild pain. What changed:  medication strength  how much to take  when to take this   amLODipine 10 MG tablet Commonly known as:  NORVASC Take 1 tablet (10 mg total) by mouth daily after supper. What changed:  medication strength  how much to take  how to take this  when to take this  additional instructions   aspirin 325 MG tablet Take 1 tablet (325 mg total) by mouth daily.   atorvastatin 80 MG tablet Commonly known as:  LIPITOR Take 1 tablet (80 mg total) by mouth daily at 6 PM.   baclofen 10 mg/mL Susp Commonly known as:  LIORESAL Take 0.25 mLs (2.5 mg total) by mouth 4 (four) times daily as needed (hiccups).   CALCIUM-CARB 600 + D 600-125 MG-UNIT  Tabs Generic drug:  Calcium Carbonate-Vitamin D Take 1 tablet by mouth daily.   chlorhexidine 0.12 % solution Commonly known as:  PERIDEX 15 mLs by Mouth Rinse route 2 (two) times daily.   clopidogrel 75 MG tablet Commonly known as:  PLAVIX Take 1 tablet (75 mg total) by mouth daily.   feeding supplement (PRO-STAT SUGAR FREE 64) Liqd Take 30 mLs by mouth 2 (two) times daily.   finasteride 5 MG tablet Commonly known as:  PROSCAR Take 5 mg by mouth daily.   FLUoxetine 20 MG capsule Commonly known as:  PROZAC Take 1 capsule (20 mg total) by mouth daily.   food thickener Powd Commonly known as:  THICK IT prn   hydrALAZINE 50 MG tablet Commonly known as:  APRESOLINE Take 1 tablet (50 mg total) by mouth every 8 (eight) hours.   multivitamin tablet Take 1 tablet by mouth daily.   potassium chloride SA 20 MEQ tablet Commonly known as:  K-DUR,KLOR-CON Take 1 tablet (20 mEq total) by mouth 2 (two) times daily.   ranitidine 75 MG tablet Commonly known as:  ZANTAC Take 75-150 mg by mouth See admin instructions. 75 mg in the am, 150 mg at bedtime   RESTORA Caps Take 1 capsule by mouth daily.   senna-docusate 8.6-50 MG tablet Commonly known as:  Senokot-S Take 2 tablets by mouth 3 (three) times daily.   tamsulosin 0.4 MG Caps capsule Commonly known as:  FLOMAX Take 0.8 mg by mouth daily.       Contact information for follow-up providers    Ankit Lorie Phenix, MD Follow up.   Specialty:  Physical Medicine and Rehabilitation Why:  office will call you with follow up appointment Contact information: [address] [place] [address] [phone]        Xu,Jindong, MD Follow up.   Specialty:  Neurology Contact information: [address] [place] [place] [address] [phone]            Contact information for after-discharge care    [facility] .   Specialty:  Skilled Nursing Facility Contact  information: [address] [place] [id number]                  Signed: Bary Leriche 11/21/2016, 9:15 AM

## 2016-11-20 NOTE — Progress Notes (Signed)
Speech Language Pathology Daily Session Note  Patient Details  Name: [name] Cook MRN: [id number] Date of Birth: 05/24/1941  Today's Date: 11/20/2016 SLP Individual Time: 1300-1400 SLP Individual Time Calculation (min): 60 min   Short Term Goals: Week 3: SLP Short Term Goal 1 (Week 3): Pt will attend to basic, familiar tasks for ~5 minutes intervals with Mod A verbal cues for redirection.  SLP Short Term Goal 2 (Week 3): Pt will attend to left field of environment during functional tasks with Min A verbal cues.  SLP Short Term Goal 3 (Week 3): Pt will consume current diet without overt s/s of aspiration with supervision verbal cues for use of swallowing compensatory strategies.  SLP Short Term Goal 4 (Week 3): Pt will consume trials of thin liquids without overt s/s of aspiration with supervision verbal cues .   Skilled Therapeutic Interventions: Pt was seen for skilled ST targeting cognitive and dysphagia goals.  Pt consumed dys 2 textures and nectar thick liquids during lunch meal with overall supervision verbal cues for use of swallowing precautions.  No overt s/s of aspiration were evident with solids or liquids and pt demonstrated appropriate rate and portion control with intake.  Pt sustained his attention to meal in a minimally distracting environment for ~10 minute intervals with min verbal cues for redirection.  Therapist facilitated the session with a previously taught card game to measure progress from previous attempts.  Due to pt's improved attention to task, pt needed significantly fewer cues for problem solving, visual scanning, and working memory during task and was an overall min assist level for 100% accuracy.  Pt and pt's wife were educated on parameters of the water protocol and its rationale.  Wife is now signed off to supervise pt with trials of water in between meals.  Pt was returned to room and left in wheelchair with quick release belt donned and call bell within reach.   Continue per current plan of care.    Function:  Eating Eating   Modified Consistency Diet: Yes Eating Assist Level: Supervision or verbal cues;Helper checks for pocketed food;Set up assist for   Eating Set Up Assist For: Opening containers       Cognition Comprehension Comprehension assist level: Follows basic conversation/direction with extra time/assistive device  Expression   Expression assist level: Expresses basic needs/ideas: With extra time/assistive device  Social Interaction Social Interaction assist level: Interacts appropriately 75 - 89% of the time - Needs redirection for appropriate language or to initiate interaction.  Problem Solving Problem solving assist level: Solves basic 50 - 74% of the time/requires cueing 25 - 49% of the time  Memory Memory assist level: Recognizes or recalls 50 - 74% of the time/requires cueing 25 - 49% of the time    Pain Pain Assessment Pain Assessment: No/denies pain  Therapy/Group: Individual Therapy  Regene Mccarthy, Selinda Orion 11/20/2016, 3:52 PM

## 2016-11-20 NOTE — Progress Notes (Signed)
Social Work Patient ID: [name] Cook, male   DOB: 1941-05-13, 75 y.o.   MRN: [id number]  Met with pt and wife to inform team conference progression toward his goals and plan now to pursue NH bed. Pt passed his MBS and  His diet was upgraded to nectar thick liquids so IV's were discharged. Both pleased with his progress this week and diet upgrade. Both on board to begin looking at [facility], prefer [facility] since close to their home. Will sent FL2 out and await bed availibility.

## 2016-11-20 NOTE — Progress Notes (Signed)
Occupational Therapy Session Note  Patient Details  Name: [name] Cook MRN: [id number] Date of Birth: Nov 07, 1941  Today's Date: 11/20/2016 OT Individual Time: [id number] OT Individual Time Calculation (min): 74 min     Short Term Goals: Week 3:  OT Short Term Goal 1 (Week 3): Pt will maintain sitting balance with Min 50% of the time during ADL AE OT Short Term Goal 2 (Week 3): Pt will recall hemi-dressing techniques 2 consecutive days with min questioning cues OT Short Term Goal 3 (Week 3): Pt will attend to items on L side with min questioning cues during functional task OT Short Term Goal 4 (Week 3): Pt and family member will demonstrate understanding of retrograde massage and PROM ther ex  Skilled Therapeutic Interventions/Progress Updates:     Upon entering the room, pt supine in bed with no c/o pain. Total A for supine >sit onto EOB. Manual facilitation and max multimodal cues for weight shift to the R while seated on EOB. Pt required assistance to don R LE prosthesis. Slide board transfer with +2 assistance for safety and max cues regarding head hip relationship with transfer to the R. UB bathing and dressing completed at sink with focus on hemiplegic techniques. Pt shaving with electric razor with minimal assistance for thoroughness. Pt remained in wheelchair with quick release belt donned. Wife entered the room, and OT educated them on secondary stroke risk education. They both verbalized understanding. CVA education to continue.   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   Therapy/Group: Individual Therapy  Gypsy Decant 11/20/2016, 12:32 PM

## 2016-11-21 ENCOUNTER — Ambulatory Visit (HOSPITAL_COMMUNITY): Payer: Medicare Other | Admitting: Speech Pathology

## 2016-11-21 ENCOUNTER — Inpatient Hospital Stay (HOSPITAL_COMMUNITY): Payer: Medicare Other | Admitting: Physical Therapy

## 2016-11-21 ENCOUNTER — Inpatient Hospital Stay (HOSPITAL_COMMUNITY): Payer: Medicare Other | Admitting: Occupational Therapy

## 2016-11-21 ENCOUNTER — Inpatient Hospital Stay
Admission: RE | Admit: 2016-11-21 | Discharge: 2017-01-06 | Disposition: A | Discharge status: Nursing Home (Includes ICF) | Payer: Medicare Other | Source: Ambulatory Visit | Attending: Internal Medicine | Admitting: Internal Medicine

## 2016-11-21 DIAGNOSIS — I69354 Hemiplegia and hemiparesis following cerebral infarction affecting left non-dominant side: Secondary | ICD-10-CM | POA: Diagnosis not present

## 2016-11-21 DIAGNOSIS — Z5189 Encounter for other specified aftercare: Secondary | ICD-10-CM | POA: Diagnosis not present

## 2016-11-21 DIAGNOSIS — Z87891 Personal history of nicotine dependence: Secondary | ICD-10-CM | POA: Diagnosis not present

## 2016-11-21 DIAGNOSIS — I69322 Dysarthria following cerebral infarction: Secondary | ICD-10-CM | POA: Diagnosis not present

## 2016-11-21 DIAGNOSIS — D649 Anemia, unspecified: Secondary | ICD-10-CM | POA: Diagnosis not present

## 2016-11-21 DIAGNOSIS — I639 Cerebral infarction, unspecified: Secondary | ICD-10-CM | POA: Diagnosis not present

## 2016-11-21 DIAGNOSIS — K219 Gastro-esophageal reflux disease without esophagitis: Secondary | ICD-10-CM | POA: Diagnosis present

## 2016-11-21 DIAGNOSIS — E876 Hypokalemia: Secondary | ICD-10-CM | POA: Diagnosis not present

## 2016-11-21 DIAGNOSIS — Z8711 Personal history of peptic ulcer disease: Secondary | ICD-10-CM | POA: Diagnosis not present

## 2016-11-21 DIAGNOSIS — G8104 Flaccid hemiplegia affecting left nondominant side: Secondary | ICD-10-CM | POA: Diagnosis not present

## 2016-11-21 DIAGNOSIS — I2510 Atherosclerotic heart disease of native coronary artery without angina pectoris: Secondary | ICD-10-CM | POA: Diagnosis not present

## 2016-11-21 DIAGNOSIS — I69252 Hemiplegia and hemiparesis following other nontraumatic intracranial hemorrhage affecting left dominant side: Secondary | ICD-10-CM | POA: Diagnosis not present

## 2016-11-21 DIAGNOSIS — Z8249 Family history of ischemic heart disease and other diseases of the circulatory system: Secondary | ICD-10-CM | POA: Diagnosis not present

## 2016-11-21 DIAGNOSIS — A419 Sepsis, unspecified organism: Secondary | ICD-10-CM | POA: Diagnosis not present

## 2016-11-21 DIAGNOSIS — Z951 Presence of aortocoronary bypass graft: Secondary | ICD-10-CM | POA: Diagnosis not present

## 2016-11-21 DIAGNOSIS — Z9049 Acquired absence of other specified parts of digestive tract: Secondary | ICD-10-CM | POA: Diagnosis not present

## 2016-11-21 DIAGNOSIS — I63231 Cerebral infarction due to unspecified occlusion or stenosis of right carotid arteries: Secondary | ICD-10-CM | POA: Diagnosis not present

## 2016-11-21 DIAGNOSIS — R531 Weakness: Secondary | ICD-10-CM | POA: Diagnosis not present

## 2016-11-21 DIAGNOSIS — R109 Unspecified abdominal pain: Secondary | ICD-10-CM | POA: Diagnosis not present

## 2016-11-21 DIAGNOSIS — E1169 Type 2 diabetes mellitus with other specified complication: Secondary | ICD-10-CM | POA: Diagnosis not present

## 2016-11-21 DIAGNOSIS — G8194 Hemiplegia, unspecified affecting left nondominant side: Secondary | ICD-10-CM | POA: Diagnosis not present

## 2016-11-21 DIAGNOSIS — I69291 Dysphagia following other nontraumatic intracranial hemorrhage: Secondary | ICD-10-CM | POA: Diagnosis not present

## 2016-11-21 DIAGNOSIS — I69328 Other speech and language deficits following cerebral infarction: Secondary | ICD-10-CM | POA: Diagnosis not present

## 2016-11-21 DIAGNOSIS — Z8673 Personal history of transient ischemic attack (TIA), and cerebral infarction without residual deficits: Secondary | ICD-10-CM | POA: Diagnosis not present

## 2016-11-21 DIAGNOSIS — E870 Hyperosmolality and hypernatremia: Secondary | ICD-10-CM | POA: Diagnosis not present

## 2016-11-21 DIAGNOSIS — R279 Unspecified lack of coordination: Secondary | ICD-10-CM | POA: Diagnosis not present

## 2016-11-21 DIAGNOSIS — K810 Acute cholecystitis: Secondary | ICD-10-CM | POA: Diagnosis not present

## 2016-11-21 DIAGNOSIS — R509 Fever, unspecified: Secondary | ICD-10-CM | POA: Diagnosis not present

## 2016-11-21 DIAGNOSIS — M1990 Unspecified osteoarthritis, unspecified site: Secondary | ICD-10-CM | POA: Diagnosis present

## 2016-11-21 DIAGNOSIS — I469 Cardiac arrest, cause unspecified: Secondary | ICD-10-CM | POA: Diagnosis present

## 2016-11-21 DIAGNOSIS — M6281 Muscle weakness (generalized): Secondary | ICD-10-CM | POA: Diagnosis not present

## 2016-11-21 DIAGNOSIS — N179 Acute kidney failure, unspecified: Secondary | ICD-10-CM | POA: Diagnosis not present

## 2016-11-21 DIAGNOSIS — Z86010 Personal history of colonic polyps: Secondary | ICD-10-CM | POA: Diagnosis not present

## 2016-11-21 DIAGNOSIS — Z888 Allergy status to other drugs, medicaments and biological substances status: Secondary | ICD-10-CM | POA: Diagnosis not present

## 2016-11-21 DIAGNOSIS — E785 Hyperlipidemia, unspecified: Secondary | ICD-10-CM | POA: Diagnosis not present

## 2016-11-21 DIAGNOSIS — N10 Acute tubulo-interstitial nephritis: Secondary | ICD-10-CM | POA: Diagnosis not present

## 2016-11-21 DIAGNOSIS — I252 Old myocardial infarction: Secondary | ICD-10-CM | POA: Diagnosis not present

## 2016-11-21 DIAGNOSIS — R066 Hiccough: Secondary | ICD-10-CM | POA: Diagnosis not present

## 2016-11-21 DIAGNOSIS — Z87442 Personal history of urinary calculi: Secondary | ICD-10-CM | POA: Diagnosis not present

## 2016-11-21 DIAGNOSIS — Z89511 Acquired absence of right leg below knee: Secondary | ICD-10-CM | POA: Diagnosis not present

## 2016-11-21 DIAGNOSIS — D62 Acute posthemorrhagic anemia: Secondary | ICD-10-CM | POA: Diagnosis not present

## 2016-11-21 DIAGNOSIS — K8020 Calculus of gallbladder without cholecystitis without obstruction: Secondary | ICD-10-CM | POA: Diagnosis not present

## 2016-11-21 DIAGNOSIS — K5901 Slow transit constipation: Secondary | ICD-10-CM | POA: Diagnosis not present

## 2016-11-21 DIAGNOSIS — Z955 Presence of coronary angioplasty implant and graft: Secondary | ICD-10-CM | POA: Diagnosis not present

## 2016-11-21 DIAGNOSIS — I6523 Occlusion and stenosis of bilateral carotid arteries: Secondary | ICD-10-CM | POA: Diagnosis not present

## 2016-11-21 DIAGNOSIS — R1011 Right upper quadrant pain: Secondary | ICD-10-CM | POA: Diagnosis not present

## 2016-11-21 DIAGNOSIS — R112 Nausea with vomiting, unspecified: Secondary | ICD-10-CM | POA: Diagnosis not present

## 2016-11-21 DIAGNOSIS — I10 Essential (primary) hypertension: Secondary | ICD-10-CM | POA: Diagnosis not present

## 2016-11-21 DIAGNOSIS — G464 Cerebellar stroke syndrome: Secondary | ICD-10-CM | POA: Diagnosis not present

## 2016-11-21 DIAGNOSIS — R7303 Prediabetes: Secondary | ICD-10-CM | POA: Diagnosis not present

## 2016-11-21 DIAGNOSIS — E1151 Type 2 diabetes mellitus with diabetic peripheral angiopathy without gangrene: Secondary | ICD-10-CM | POA: Diagnosis present

## 2016-11-21 DIAGNOSIS — I959 Hypotension, unspecified: Secondary | ICD-10-CM | POA: Diagnosis present

## 2016-11-21 DIAGNOSIS — I6522 Occlusion and stenosis of left carotid artery: Secondary | ICD-10-CM | POA: Diagnosis not present

## 2016-11-21 DIAGNOSIS — E440 Moderate protein-calorie malnutrition: Secondary | ICD-10-CM | POA: Diagnosis not present

## 2016-11-21 DIAGNOSIS — I6529 Occlusion and stenosis of unspecified carotid artery: Secondary | ICD-10-CM | POA: Diagnosis not present

## 2016-11-21 DIAGNOSIS — E1159 Type 2 diabetes mellitus with other circulatory complications: Secondary | ICD-10-CM | POA: Diagnosis not present

## 2016-11-21 DIAGNOSIS — J09X1 Influenza due to identified novel influenza A virus with pneumonia: Secondary | ICD-10-CM | POA: Diagnosis not present

## 2016-11-21 NOTE — Plan of Care (Signed)
Problem: RH BOWEL ELIMINATION Goal: RH STG MANAGE BOWEL WITH ASSISTANCE STG Manage Bowel with moderate Assistance.   Outcome: Not Met (add Reason) Pt SNF placement- total assist

## 2016-11-21 NOTE — Progress Notes (Signed)
Social Work  Discharge Note  The overall goal for the admission was met for:   Discharge location: No-[facility]  Length of Stay: Yes-22 DAYS  Discharge activity level: Yes-MOD/MAX ASSIST  Home/community participation: Yes  Services provided included: MD, RD, PT, OT, SLP, RN, CM, TR, Pharmacy, Neuropsych and SW  Financial Services: Medicare and Private Insurance: [place]  Follow-up services arranged: Other: SHORT TERM NHP  Comments (or additional information):PT NEEDS MORE REHAB AND A HIGHER LEVEL OF CARE FOR FAMILY TO BE ABLE TO TAKE HOME. HOPE AFTER MORE REHAB HE WILL BE ABLE TO RETURN HOME.  Patient/Family verbalized understanding of follow-up arrangements: Yes  Individual responsible for coordination of the follow-up plan: WIFE AND Elwyn-SON  Confirmed correct DME delivered: [name] 11/21/2016    [name]

## 2016-11-21 NOTE — Plan of Care (Signed)
Problem: RH Balance Goal: LTG: Patient will maintain dynamic sitting balance (OT) LTG:  Patient will maintain dynamic sitting balance with assistance during activities of daily living (OT)  Outcome: Not Met (add Reason) Pt able to maintain sitting balance w/ supervision on therapy mat, but required Mod A to maintain sitting balance with extra activity demands during ADLs. ESD 12/28 Goal: LTG Patient will maintain dynamic standing with ADLs (OT) LTG:  Patient will maintain dynamic standing balance with assist during activities of daily living (OT)   Outcome: Not Applicable Date Met: 06/77/03 D/c goal, pt unable to progress to standing balance 2/2 short LOS for dc to SNF  Problem: RH Dressing Goal: LTG Patient will perform upper body dressing (OT) LTG Patient will perform upper body dressing with assist, with/without cues (OT).  Outcome: Not Met (add Reason) Pt unable to maintain sitting balance needed to perform UB Dressing task. Pt able to verbalize hemi-techniques for dressing and required minimal questioning cues to attend to task which demonstrated improvement. -ESD Goal: LTG Patient will perform lower body dressing w/assist (OT) LTG: Patient will perform lower body dressing with assist, with/without cues in positioning using equipment (OT)  Outcome: Not Met (add Reason) Pt continued to required Max A for LB dressing. ESD  Problem: RH Toileting Goal: LTG Patient will perform toileting w/assist, cues/equip (OT) LTG: Patient will perform toiletiing (clothes management/hygiene) with assist, with/without cues using equipment (OT)  Outcome: Not Met (add Reason) Total A for toileting -ESD  Problem: RH Toilet Transfers Goal: LTG Patient will perform toilet transfers w/assist (OT) LTG: Patient will perform toilet transfers with assist, with/without cues using equipment (OT)  Outcome: Not Met (add Reason) +2 assist for toilet transfers ESD  Problem: RH Tub/Shower Transfers Goal: LTG  Patient will perform tub/shower transfers w/assist (OT) LTG: Patient will perform tub/shower transfers with assist, with/without cues using equipment (OT)  Outcome: Not Applicable Date Met: 40/35/24 Unsafe for shower transfers at this time 2/2 short LOS and dc to SNF

## 2016-11-21 NOTE — Progress Notes (Signed)
[facility]IC[facility] RE[facility]ILIT[facility]     PROGRESS NOTE    Subjective/Complaints: Pt seen laying in bed this AM.  Pt appears slightly hesitant when informed about plan for discharge today and states he needs his wife.    ROS: Denies CP, SOB, nausea, vomiting, diarrhea  Objective: Vital Signs: Blood pressure (!) 138/52, pulse 80, temperature 98 F (36.7 C), temperature source Oral, resp. rate 16, height 5\' 8"  (1.727 m), weight 85.9 kg (189 lb 6.4 oz), SpO2 99 %. Dg Swallowing Func-speech Pathology  Result Date: 11/20/2016 Objective Swallowing Evaluation: Type of Study: MBS-Modified Barium Swallow Study Patient Details Name: [name] Cook MRN: [id number] Date of Birth: [birth year] Today's Date: 11/20/2016 Time: SLP Start Time 0935 -SLP Stop Time 0957 SLP Time Calculation (min) (ACUTE ONLY): 22 min Past Medical History: Past Medical History: Diagnosis Date . Arthritis  . CAD (coronary artery disease)   2014 75% left main stenosis, LAD 80% followed by a mid subtotal stenosis, circumflex 90% stenosis, right coronary artery occluded, SVG to RCA occluded, SVG ramus occluded, SVG to OM patent without a sequential anastomosis noted. Graft had a focal 99% stenosis which was stented. 3.5 x 16 mm premier. b. cath 10/13/2014 DES to SVG to OM overlap previous stent . Cholelithiasis 2014 . Cogan syndrome  . Colon polyps  . Diabetes mellitus without complication ([place])  . Dyslipidemia  . Gallstones  . GERD (gastroesophageal reflux disease)  . Hepatic hemangioma 2014 . 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 . Hypertension  . Kidney stones  . Myocardial infarction 1999 . Necrotizing fasciitis ([place])  . Peptic ulcer disease  . Peripheral vascular disease [facility])  Past Surgical History: Past Surgical History: Procedure Laterality Date . APPENDECTOMY  2007 . CORONARY ARTERY BYPASS GRAFT  1999  LIMA to the LAD, saphenous vein graft to obtuse marginal 1 and obtuse marginal 2, spahenous vein graft to  intermediate, spahenous vein graft to the right coronary artery in 1999 . LEFT HEART CATHETERIZATION WITH CORONARY ANGIOGRAM N/A 10/13/2014  Procedure: LEFT HEART CATHETERIZATION WITH CORONARY ANGIOGRAM;  Surgeon: Peter M Martinique, MD;  Location: [facility] CATH LAB;  Service: Cardiovascular;  Laterality: N/A; . LEFT HEART CATHETERIZATION WITH CORONARY/GRAFT ANGIOGRAM N/A 07/19/2013  Procedure: LEFT HEART CATHETERIZATION WITH Beatrix Fetters;  Surgeon: Peter M Martinique, MD;  Location: [facility] CATH LAB;  Service: Cardiovascular;  Laterality: N/A; . LEG AMPUTATION  1999  Right BKA . PERCUTANEOUS CORONARY STENT INTERVENTION (PCI-S)  07/19/2013  Procedure: PERCUTANEOUS CORONARY STENT INTERVENTION (PCI-S);  Surgeon: Peter M Martinique, MD;  Location: [facility] CATH LAB;  Service: Cardiovascular;; HPI: Gin Summerlinis a 75 y.o.malewith medical history significant for coronary artery disease status post CABG, type 2 diabetes mellitus, hypertension, hyperlipidemia, and GERD who presents to the ED with weakness involving the left face, left arm, and left leg resulting in a fall. 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 CXR mild hypo inflation accentuates the lung markings, likely low-grade CHF with mild interstitial edema. Mildly increased density in the right perihilar region may reflect subsegmental atelectasis.  Pt admitted to CIR and diet has been gradually advanced back to dys 2, honey thick liquids after initial downgrade on admission due to poor toleration of solids.  Repeat objective assessment ordered today to determine readiness for advancement given improvements noted at bedside.   No Data Recorded Assessment / Plan / Recommendation CHL  IP CLINICAL IMPRESSIONS 11/20/2016 Therapy Diagnosis Moderate oral phase dysphagia;Mild pharyngeal phase  dysphagia Clinical Impression Pt presents with improvements in swallowing function in comparison to previous study which SLP suspects to be a function of improved attention to boluses.  Pt continues to present with a mild-moderate oropharyngeal dysphagia.  Oral phase deficits are characterized by impaired containment and transit of boluses due to left sided weakness.  Pt has decreased cohesion of thin liquid boluses due to base of tongue weakness which leads to premature spillage into the pharynx with swallow response triggered at the level of the pyriforms.  Bolus cohesion was improved with heavier viscosities and response was triggered at the vallecula without aspiration or penetration.  Mild diffuse residuals were noted throughout pharynx with thicker>thinner viscosities due to weakness which was cleared with cues for extra swallows.  No significant airway compromise evident with thin liqiuds during study; however, pt was noted with immediate coughing after study when consuming water to clear the oral cavity.  As a result, recommend upgrading pt to nectar thick liquids with sips of water in between meals per the water protocol to continue working towards progression.  Prognosis for ongoing advancement good with continued improved cognitive functioning and skilled ST interventions for management of safe diet upgrade. Impact on safety and function Mild aspiration risk     Prognosis 11/20/2016 Prognosis for Safe Diet Advancement Good Barriers to Reach Goals -- Barriers/Prognosis Comment -- CHL IP DIET RECOMMENDATION 11/20/2016 SLP Diet Recommendations Dysphagia 2 (Fine chop) solids;Nectar thick liquid Liquid Administration via Cup Medication Administration Crushed with puree Compensations Slow rate;Small sips/bites;Minimize environmental distractions;Lingual sweep for clearance of pocketing;Multiple dry swallows after each bite/sip Postural Changes Seated upright at 90 degrees            CHL IP ORAL PHASE 11/20/2016  Oral Phase Impaired Oral - Pudding Teaspoon -- Oral - Pudding Cup -- Oral - Honey Teaspoon -- Oral - Honey Cup Left anterior bolus loss;Weak lingual manipulation;Reduced posterior propulsion;Premature spillage;Delayed oral transit Oral - Nectar Teaspoon -- Oral - Nectar Cup Left anterior bolus loss;Weak lingual manipulation;Reduced posterior propulsion;Premature spillage;Delayed oral transit Oral - Nectar Straw -- Oral - Thin Teaspoon -- Oral - Thin Cup Left anterior bolus loss;Weak lingual manipulation;Reduced posterior propulsion;Decreased bolus cohesion;Premature spillage;Delayed oral transit Oral - Thin Straw Decreased bolus cohesion;Premature spillage Oral - Puree Delayed oral transit;Premature spillage Oral - Mech Soft -- Oral - Regular Delayed oral transit Oral - Multi-Consistency -- Oral - Pill -- Oral Phase - Comment --  CHL IP PHARYNGEAL PHASE 11/20/2016 Pharyngeal Phase Impaired Pharyngeal- Pudding Teaspoon -- Pharyngeal -- Pharyngeal- Pudding Cup -- Pharyngeal -- Pharyngeal- Honey Teaspoon -- Pharyngeal -- Pharyngeal- Honey Cup Delayed swallow initiation-vallecula;Reduced tongue base retraction;Pharyngeal residue - pyriform;Pharyngeal residue - posterior pharnyx;Lateral channel residue;Pharyngeal residue - valleculae Pharyngeal -- Pharyngeal- Nectar Teaspoon -- Pharyngeal -- Pharyngeal- Nectar Cup Delayed swallow initiation-vallecula;Reduced tongue base retraction;Pharyngeal residue - pyriform;Pharyngeal residue - posterior pharnyx;Lateral channel residue;Pharyngeal residue - valleculae Pharyngeal -- Pharyngeal- Nectar Straw -- Pharyngeal -- Pharyngeal- Thin Teaspoon -- Pharyngeal -- Pharyngeal- Thin Cup Delayed swallow initiation-pyriform sinuses;Reduced tongue base retraction;Pharyngeal residue - pyriform Pharyngeal -- Pharyngeal- Thin Straw -- Pharyngeal -- Pharyngeal- Puree Delayed swallow initiation-vallecula;Pharyngeal residue - valleculae;Pharyngeal residue - pyriform;Pharyngeal residue -  posterior pharnyx;Reduced tongue base retraction Pharyngeal -- Pharyngeal- Mechanical Soft -- Pharyngeal -- Pharyngeal- Regular Delayed swallow initiation-vallecula;Reduced tongue base retraction;Pharyngeal residue - pyriform Pharyngeal -- Pharyngeal- Multi-consistency -- Pharyngeal -- Pharyngeal- Pill -- Pharyngeal -- Pharyngeal Comment --  No flowsheet data found. Page,  Selinda Orion 11/20/2016, 3:41 PM               Recent Labs  11/20/16 0742  WBC 6.6  HGB 11.4*  HCT 32.9*  PLT 263    Recent Labs  11/20/16 0742  NA 137  K 3.4*  CL 107  GLUCOSE 114*  BUN 13  CREATININE 0.68  CALCIUM 8.0*   CBG (last 3)  No results for input(s): GLUCAP in the last 72 hours.  Wt Readings from Last 3 Encounters:  11/13/16 85.9 kg (189 lb 6.4 oz)  10/28/16 92 kg (202 lb 12.8 oz)  09/20/16 95.8 kg (211 lb 4 oz)    Physical Exam:  Constitutional: NAD. Vital signs reviewed.   HENT: Normocephalic and atraumatic.  Eyes: EOM are normal. No discharge.  Cardiovascular: RRR, +murmur. No JVD. Respiratory: Clear. Unlabored.  GI: Soft. Bowel sounds are normal. Musculoskeletal: He exhibits no edema. He exhibits no tenderness.  RLE BKA healed Neurological: He is alert.  Alert and oriented x3. Dysarthria Left facial weakness (stable) Motor: LUE 0/5. mAS 1/4 with finger flexors (persistent) LLE: HF 2/5, HE 2+/5, KE 1+/5, ADF 1+/5 (unchanged) RUE: 5/5 proximal to distal RLE: HF 5/5. Skin: Skin is warm and dry and intact.  Psychiatric: Flat.   Assessment/Plan: 1. Functional deficits secondary to right basal ganglia infarct which require 3+ hours per day of interdisciplinary therapy in a comprehensive inpatient rehab setting. Physiatrist is providing close team supervision and 24 hour management of active medical problems listed below. Physiatrist and rehab team continue to assess barriers to discharge/monitor patient progress toward functional and medical goals.  Function:  Bathing Bathing position    Position: Wheelchair/chair at sink (UB only)  Bathing parts Body parts bathed by patient: Left arm, Chest, Abdomen Body parts bathed by helper: Right arm  Bathing assist Assist Level: Touching or steadying assistance(Pt > 75%)      Upper Body Dressing/Undressing Upper body dressing   What is the patient wearing?: Pull over shirt/dress     Pull over shirt/dress - Perfomed by patient: Thread/unthread right sleeve, Put head through opening Pull over shirt/dress - Perfomed by helper: Pull shirt over trunk, Thread/unthread left sleeve        Upper body assist Assist Level:  (mod a)      Lower Body Dressing/Undressing Lower body dressing   What is the patient wearing?: Pants, AFO, Socks, Shoes     Pants- Performed by patient: Thread/unthread right pants leg Pants- Performed by helper: Thread/unthread right pants leg, Pull pants up/down     Socks - Performed by patient: Don/doff right sock Socks - Performed by helper: Don/doff left sock   Shoes - Performed by helper: Don/doff left shoe   AFO - Performed by helper: Don/doff right AFO      Lower body assist Assist for lower body dressing:  (Total assist)      Toileting Toileting Toileting activity did not occur: No continent bowel/bladder event   Toileting steps completed by helper: Adjust clothing prior to toileting, Performs perineal hygiene, Adjust clothing after toileting Toileting Assistive Devices: Toilet aid  Toileting assist Assist level: Two helpers   Transfers Chair/bed transfer   Chair/bed transfer method: Lateral scoot Chair/bed transfer assist level: Moderate assist (Pt 50 - 74%/lift or lower) (to L) Chair/bed transfer assistive device: Sliding board, Armrests, Prosthesis Mechanical lift: Maximove   Locomotion Ambulation Ambulation activity did not occur: Safety/medical concerns   Max distance: 15 ft Assist level: 2 helpers   Wheelchair  Type: Manual Max wheelchair distance: 30 ft Assist Level:  Moderate assistance (Pt 50 - 74%)  Cognition Comprehension Comprehension assist level: Follows basic conversation/direction with extra time/assistive device  Expression Expression assist level: Expresses basic needs/ideas: With extra time/assistive device  Social Interaction Social Interaction assist level: Interacts appropriately 75 - 89% of the time - Needs redirection for appropriate language or to initiate interaction.  Problem Solving Problem solving assist level: Solves basic 50 - 74% of the time/requires cueing 25 - 49% of the time  Memory Memory assist level: Recognizes or recalls 50 - 74% of the time/requires cueing 25 - 49% of the time   Medical Problem List and Plan: 1.  Left hemiplegia, dysarthria, dysphagia secondary to right basal ganglia infarct with history of right BKA  D/c to SNF  Will see pt in 1 month for follow up.   Has left resting hand splint, PRAFO   Fluoxetine started 12/8, increased on 12/19 2.  DVT Prophylaxis/Anticoagulation: Pharmaceutical: Lovenox 3. Pain Management: tylenol prn. Continue to educate patient family on LUE positioning.  4. Mood: LCSW to follow for evaluation and support.  5. Neuropsych: This patient is not fully capable of making decisions on his own behalf. 6. Skin/Wound Care: routine pressure relief measures 7. Fluids/Electrolytes/Nutrition: Monitor I/O.   D1 Honey, advanced to D2 Honey, later to D2 Nectar  Monitor hydration on honey liquids- concerns of adequate intake with ongoing hiccups and coughing due to difficulty with secretions.  8. HTN: Monitor BP. Goal of BP 130-150--avoid hypotension  Lisinopril 2.5 started 12/11, increased to 7.5 on 12/12, increased to 10 on 12/13, d/ced on 12/16.    Norvasc 5mg  started 12/12, increased to 10 on 12/16  Hydralazine 25 TID started 12/16, increased to 50 on 12/18  Within goal range on 12/28  Will cont to monitor Vitals:   11/20/16 2133 11/21/16 0533  BP: (!) 142/53 (!) 138/52  Pulse:  80  Resp:   16  Temp:  98 F (36.7 C)   9. 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  11. Hiccups:   Baclofen increased to 10mg  QID monitor for sedation, ?resolved, weaned to 5mg  on 12/13, decreased to BID on 12/18, d/ced 12/21 12. GERD: On Zantac bid. 13. Pre-diabtes: Hgb A1c- 5.9.  Stable 12/21, d/ced CBGs 14. Mild hypokalemia:  K+ 3.4 on 12/27   Supplement increased 12/27  Will cont to monitor 15. AKI: Resolved  Dehydration +/- ACE  Started IVF at night, ACE d/ced, will d/c IVF and monitor  Cr 0.68 on 12/27 16. Hypernatremia: Resolved  Cont to monitor 17. Constipation  Increased reg on 12/22  Improving 18. ABLA  Hb 11.4 on 12/27  Cont to monitor   LOS (Days) 22 A FACE TO FACE EVALUATION WAS PERFORMED  Ankit Lorie Phenix 11/21/2016 12:57 PM

## 2016-11-21 NOTE — Plan of Care (Signed)
Problem: RH Memory Goal: LTG Patient will demonstrate ability for day to day (SLP) LTG:  Patient will demonstrate ability for day to day recall/carryover during cognitive/linguistic activities with assist  (SLP)  Outcome: Not Met (add Reason) Pt limited by attention and visual scanning for use of memory compensatory aids, poor carryover of information and recall of daily information

## 2016-11-21 NOTE — Progress Notes (Signed)
Pt discharged to [facility] center via ambulance at 1145. RN attempted to call to give report multiple times with voicemail left to call IP rehab for report. Belongings with pt and wife.

## 2016-11-21 NOTE — Plan of Care (Signed)
Problem: RH BOWEL ELIMINATION Goal: RH STG MANAGE BOWEL W/MEDICATION W/ASSISTANCE STG Manage Bowel with Medication with minimal Assistance.   Outcome: Progressing PT has scheduled senna-Kote.   Problem: RH BLADDER ELIMINATION Goal: RH STG MANAGE BLADDER WITH ASSISTANCE STG Manage Bladder With moderate Assistance    Outcome: Progressing Pt has been using the urinal and using the call bell to call.

## 2016-11-21 NOTE — Progress Notes (Signed)
Occupational Therapy Discharge Summary  Patient Details  Name: [name] Cook MRN: [id number] Date of Birth: [birth year]  Today's Date: 11/21/2016 OT Individual Time: 0900-1000 OT Individual Time Calculation (min): 60 min   Patient has met 6 of 13 long term goals due to improved activity tolerance, improved balance, postural control, ability to compensate for deficits, improved attention, improved awareness and improved coordination.  Patient to discharge at [facility] Max Assist level. Patient to continue OT at next venue of care.    Reasons goals not met: Pt able to maintain sitting balance w/ supervision on therapy mat, but required Mod A to maintain sitting balance with extra activity demands during ADLs. Pt unable to maintain sitting balance needed to perform UB Dressing task. Pt able to verbalize hemi-techniques for dressing and required minimal questioning cues to attend to task which demonstrated improvement. -ESD Pt unable to progress to standing balance 2/2 short LOS for dc to SNF   Patient will benefit from ongoing skilled OT services in skilled nursing facility setting to continue to advance functional skills in the area of BADL and Reduce care partner burden.  Equipment: No equipment provided  Reasons for discharge: discharge from hospital  Patient/family agrees with progress made and goals achieved: Yes    Skilled Therapeutic Interventions: OT session focused on sitting balance, transfer training, attention, awareness, and sequencing during ADL tasks. ADLs completed sitting EOB. Pt able to maintain sitting balance in minimally distracting environment. As soon as task demands placed on patient such as bathing or dressing, pt unable to maintain balance and required Mod A-intermittent Min A when participating in ADLs. Able to correct posture by stopping ADL task and redirecting pt to focus on his balance. Pt qith improved sequence and able to complete grooming tasks with good  carryover using one-handed techniques. Sliding board transfers improved to overall Mod A to L and R.   OT Discharge Precautions/Restrictions  Precautions Precautions: Fall Precaution Comments: dense L hemi, old R BKA with prosthesis Restrictions Weight Bearing Restrictions: No Pain  none/denies pain ADL ADL Eating: Supervision/safety Where Assessed-Eating: Wheelchair Grooming: Supervision/safety Where Assessed-Grooming: Wheelchair Upper Body Bathing: Moderate assistance Lower Body Bathing: Moderate assistance Upper Body Dressing: Maximal assistance Where Assessed-Upper Body Dressing: Edge of bed Lower Body Dressing: Maximal assistance Where Assessed-Lower Body Dressing: Edge of bed ADL Comments: See functional navigator  Cognition Overall Cognitive Status: Impaired/Different from baseline Arousal/Alertness: Awake/alert Orientation Level: Oriented X4 Attention: Sustained Sustained Attention: Impaired Sustained Attention Impairment: Verbal basic;Functional basic Memory: Impaired Memory Impairment: Decreased recall of new information Awareness: Impaired Awareness Impairment: Emergent impairment Problem Solving: Impaired Problem Solving Impairment: Functional basic;Verbal basic Executive Function: Decision Making;Self Monitoring;Self Correcting;Reasoning Reasoning: Impaired Reasoning Impairment: Verbal basic;Functional basic Decision Making: Impaired Decision Making Impairment: Verbal basic;Functional basic Initiating: Impaired Initiating Impairment: Functional basic Self Monitoring: Impaired Self Monitoring Impairment: Verbal basic;Functional basic Self Correcting: Impaired Self Correcting Impairment: Verbal basic;Functional basic Safety/Judgment: Impaired Comments: left inattention Sensation Sensation Light Touch: Appears Intact Light Touch Impaired Details: Impaired LUE;Impaired LLE Stereognosis: Not tested Hot/Cold: Appears Intact Proprioception: Impaired  Detail Proprioception Impaired Details: Absent LUE;Absent LLE Coordination Gross Motor Movements are Fluid and Coordinated: No Fine Motor Movements are Fluid and Coordinated: No Coordination and Movement Description: dense L hemi with LE tone Motor  Motor Motor: Hemiplegia;Abnormal tone;Abnormal postural alignment and control Motor - Discharge Observations: improved sitting balance and midline orientation since evaluation Mobility  Bed Mobility Bed Mobility: Sit to Supine Sit to Supine: 1: +2 Total assist;HOB flat Transfers Sit to Stand: 1: +  2 Total assist  Balance Balance Balance Assessed: Yes Static Sitting Balance Static Sitting - Balance Support: Right upper extremity supported;Feet supported;No upper extremity supported Static Sitting - Level of Assistance: 5: Stand by assistance Dynamic Sitting Balance Dynamic Sitting - Balance Support: Feet supported;No upper extremity supported;During functional activity Dynamic Sitting - Level of Assistance: 3: Mod assist Dynamic Sitting Balance - Compensations: Mod A with increased task demands of ADLs Sitting balance - Comments: loses balance posterior/ anterior and to the left, especially with decr attention; Extremity/Trunk Assessment RUE Assessment RUE Assessment: Within Functional Limits LUE Assessment LUE Assessment: Exceptions to [facility] LUE Strength LUE Overall Strength: Deficits LUE Overall Strength Comments: grossly 0/5, flaccid; brunstrom 1 - trace shoulder scap prot/ret noted   See Function Navigator for Current Functional Status.  Daneen Schick Ahmir Bracken 11/21/2016, 4:17 PM

## 2016-11-22 ENCOUNTER — Encounter: Payer: Self-pay | Admitting: Internal Medicine

## 2016-11-22 ENCOUNTER — Non-Acute Institutional Stay (SKILLED_NURSING_FACILITY): Payer: Medicare Other | Admitting: Internal Medicine

## 2016-11-22 ENCOUNTER — Encounter (HOSPITAL_COMMUNITY)
Admission: RE | Admit: 2016-11-22 | Discharge: 2016-11-22 | Disposition: A | Discharge status: Home / Self Care | Payer: Medicare Other | Source: Skilled Nursing Facility | Attending: Internal Medicine | Admitting: Internal Medicine

## 2016-11-22 DIAGNOSIS — E876 Hypokalemia: Secondary | ICD-10-CM | POA: Diagnosis not present

## 2016-11-22 DIAGNOSIS — I10 Essential (primary) hypertension: Secondary | ICD-10-CM | POA: Diagnosis not present

## 2016-11-22 DIAGNOSIS — D649 Anemia, unspecified: Secondary | ICD-10-CM | POA: Insufficient documentation

## 2016-11-22 DIAGNOSIS — I639 Cerebral infarction, unspecified: Secondary | ICD-10-CM | POA: Diagnosis not present

## 2016-11-22 DIAGNOSIS — I6523 Occlusion and stenosis of bilateral carotid arteries: Secondary | ICD-10-CM | POA: Diagnosis not present

## 2016-11-22 DIAGNOSIS — G8104 Flaccid hemiplegia affecting left nondominant side: Secondary | ICD-10-CM | POA: Diagnosis not present

## 2016-11-22 DIAGNOSIS — I63231 Cerebral infarction due to unspecified occlusion or stenosis of right carotid arteries: Secondary | ICD-10-CM

## 2016-11-22 DIAGNOSIS — Z5189 Encounter for other specified aftercare: Secondary | ICD-10-CM | POA: Insufficient documentation

## 2016-11-22 DIAGNOSIS — E1169 Type 2 diabetes mellitus with other specified complication: Secondary | ICD-10-CM | POA: Insufficient documentation

## 2016-11-22 LAB — CBC
HCT: 35.3 % — ABNORMAL LOW (ref 39.0–52.0)
Hemoglobin: 11.8 g/dL — ABNORMAL LOW (ref 13.0–17.0)
MCH: 32.4 pg (ref 26.0–34.0)
MCHC: 33.4 g/dL (ref 30.0–36.0)
MCV: 97 fL (ref 78.0–100.0)
PLATELETS: 300 10*3/uL (ref 150–400)
RBC: 3.64 MIL/uL — AB (ref 4.22–5.81)
RDW: 12.5 % (ref 11.5–15.5)
WBC: 5.4 10*3/uL (ref 4.0–10.5)

## 2016-11-22 LAB — BASIC METABOLIC PANEL
Anion gap: 6 (ref 5–15)
BUN: 12 mg/dL (ref 6–20)
CO2: 24 mmol/L (ref 22–32)
CREATININE: 0.71 mg/dL (ref 0.61–1.24)
Calcium: 8.2 mg/dL — ABNORMAL LOW (ref 8.9–10.3)
Chloride: 106 mmol/L (ref 101–111)
GFR calc Af Amer: >60 mL/min (ref >60)
GLUCOSE: 126 mg/dL — AB (ref 65–99)
POTASSIUM: 3.6 mmol/L (ref 3.5–5.1)
SODIUM: 136 mmol/L (ref 135–145)

## 2016-11-22 NOTE — Progress Notes (Signed)
Location:   [place] Room Number: 128/P Place of Service:  SNF [phone]) Provider:  Heide Guile, MD  Patient Care Team: Wardell Honour, MD as PCP - General (Family Medicine) Chipper Herb, MD as Attending Physician (Family Medicine) Minus Breeding, MD as Consulting Physician (Cardiology)  Extended Emergency Contact Information Primary Emergency Contact: Turrubiates,Carol Address: [place], [place] [address] [place] Phone: [phone] Mobile Phone: [phone] Relation: Spouse Secondary Emergency Contact: [place] Mobile Phone: [phone] Relation: Son  Code Status:  Full Code Goals of care: Advanced Directive information Advanced Directives 11/22/2016  Does Patient Have a Medical Advance Directive? Yes  Type of Advance Directive (No Data)  Does patient want to make changes to medical advance directive? No - Patient declined  Copy of [place] in Chart? -  Would patient like information on creating a medical advance directive? No - Patient declined  Pre-existing out of facility DNR order (yellow form or pink MOST form) -     Chief Complaint  Patient presents with  . Acute Visit   Status post hospitalization for CVA  HPI:  Pt is a 75 y.o. male seen today for an acute visit for admission to skilled nursing after hospitalization for CVA.  Apparently he presented to the ED with left-sided weakness including left face arm and leg.  He had fallen earlier today and had difficulty getting up.  He was diagnosed with a nondominant right basal ganglia infarct etiology uncertain-  He was put on dual antiplatelet therapy including aspirin and Plavix with recommendation to  Just continue Plavix after 3 months.  He has been hypertensive there is recommendation for permissive hypertension okay initially systolically lgreater than [id number]  and diastolic greater than [id number] But gradual see normalization 5-7 days with long-term goal [id number] systolically secondary to ICA stenosis-he is on Norvasc 10 mg a day hydralazine 50 mg 3 times a day.  He also has a history hyperlipidemia on Lipitor 80 mg a day LDL in hospital was 180--his Lipitor was increased in the hospital.  Currently he is resting in bed comfortably he has no complaints vital signs appear stable            Past Medical History:  Diagnosis Date  . Arthritis   . CAD (coronary artery disease)    2014 75% left main stenosis, LAD 80% followed by a mid subtotal stenosis, circumflex 90% stenosis, right coronary artery occluded, SVG to RCA occluded, SVG ramus occluded, SVG to OM patent without a sequential anastomosis noted. Graft had a focal 99% stenosis which was stented. 3.5 x 16 mm premier. b. cath 10/13/2014 DES to SVG to OM overlap previous stent  . Cholelithiasis 2014  . Cogan syndrome   . Colon polyps   . Diabetes mellitus without complication ([place])   . Dyslipidemia   . Gallstones   . GERD (gastroesophageal reflux disease)   . Hepatic hemangioma 2014  . 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  . Hypertension   . Kidney stones   . Myocardial infarction 1999  . Necrotizing fasciitis ([place])   . Peptic ulcer disease   . Peripheral vascular disease [facility])    Past Surgical History:  Procedure Laterality Date  . APPENDECTOMY  2007  . CORONARY ARTERY BYPASS GRAFT  1999   LIMA to  the LAD, saphenous vein graft to obtuse marginal 1 and obtuse marginal 2, spahenous vein graft to intermediate, spahenous vein graft to the right coronary artery in 1999  . LEFT HEART CATHETERIZATION WITH CORONARY ANGIOGRAM N/A 10/13/2014   Procedure: LEFT HEART CATHETERIZATION WITH CORONARY ANGIOGRAM;  Surgeon: Peter M Martinique, MD;  Location: [facility] CATH LAB;  Service: Cardiovascular;  Laterality: N/A;  . LEFT HEART CATHETERIZATION WITH CORONARY/GRAFT ANGIOGRAM N/A  07/19/2013   Procedure: LEFT HEART CATHETERIZATION WITH Beatrix Fetters;  Surgeon: Peter M Martinique, MD;  Location[facility] C[facility] LAB;  Service: Cardiovascular;  Laterality: N/A;  . LEG AMPUTATION  1999   Right BKA  . PERCUTANEOUS CORONARY STENT INTERVENTION (PCI-S)  07/19/2013   Procedure: PERCUTANEOUS CORONARY STENT INTERVENTION (PCI-S);  Surgeon: Peter M Martinique, MD;  Location: [facility] CATH LAB;  Service: Cardiovascular;;    Allergies  Allergen Reactions  . Altace [Ramipril] Other (See Comments)    hypotension  . Ambien [Zolpidem Tartrate] Other (See Comments)    Sleep walking    Current Outpatient Prescriptions on File Prior to Visit  Medication Sig Dispense Refill  . Amino Acids-Protein Hydrolys (FEEDING SUPPLEMENT, PRO-STAT SUGAR FREE 64,) LIQD Take 30 mLs by mouth 2 (two) times daily. 900 mL 0  . amLODipine (NORVASC) 10 MG tablet Take 1 tablet (10 mg total) by mouth daily after supper.    Marland Kitchen aspirin 325 MG tablet Take 1 tablet (325 mg total) by mouth daily.    Marland Kitchen atorvastatin (LIPITOR) 80 MG tablet Take 1 tablet (80 mg total) by mouth daily at 6 PM.    . baclofen (LIORESAL) 10 mg/mL SUSP Take 0.25 mLs (2.5 mg total) by mouth 4 (four) times daily as needed (hiccups).    . Calcium Carbonate-Vitamin D (CALCIUM-CARB 600 + D) 600-125 MG-UNIT TABS Take 1 tablet by mouth daily.      . chlorhexidine (PERIDEX) 0.12 % solution 15 mLs by Mouth Rinse route 2 (two) times daily. 120 mL 0  . clopidogrel (PLAVIX) 75 MG tablet Take 1 tablet (75 mg total) by mouth daily.    . finasteride (PROSCAR) 5 MG tablet Take 5 mg by mouth daily.    Marland Kitchen FLUoxetine (PROZAC) 20 MG capsule Take 1 capsule (20 mg total) by mouth daily.  3  . food thickener (THICK IT) POWD prn  0  . hydrALAZINE (APRESOLINE) 50 MG tablet Take 1 tablet (50 mg total) by mouth every 8 (eight) hours.    . Multiple Vitamin (MULTIVITAMIN) tablet Take 1 tablet by mouth daily.      . potassium chloride SA (K-DUR,KLOR-CON) 20 MEQ tablet Take 1 tablet  (20 mEq total) by mouth 2 (two) times daily.    . Probiotic Product (RESTORA) CAPS Take 1 capsule by mouth daily. 30 capsule 6  . ranitidine (ZANTAC) 75 MG tablet Take 75-150 mg by mouth See admin instructions. 75 mg in the am, 150 mg at bedtime    . senna-docusate (SENOKOT-S) 8.6-50 MG tablet Take 2 tablets by mouth 3 (three) times daily.    . tamsulosin (FLOMAX) 0.4 MG CAPS Take 0.8 mg by mouth daily.      No current facility-administered medications on file prior to visit.      Review of Systems   In general no complaints of fever chills.  Skin does not complain of rashes or itching.  Head ears eyes nose mouth and throat does not complaining of visual changes.  Respiratory is not complaining shortness breath or cough.  Cardiac denies chest pain.  GI does not complain of abdominal discomfort nausea or vomiting says at times he has constipation he did have a bowel movement apparently this morning.  GU is not complaining of dysuria muscle skeletal is not complaining of joint pain currently.  Neurologic does have a history of CVA with left-sided hemiparesis weakness greater on his left arm versus left leg.  Is not complaining of headache dizziness or syncope.  Psych does not complain about depression or anxiety   There is no immunization history for the selected administration types on file for this patient. Pertinent  Health Maintenance Due  Topic Date Due  . FOOT EXAM  12/27/1950  . OPHTHALMOLOGY EXAM  12/27/1950  . COLONOSCOPY  03/25/2016  . URINE MICROALBUMIN  09/20/2016  . INFLUENZA VACCINE  02/22/2017 (Originally 06/25/2016)  . PNA vac Low Risk Adult (1 of 2 - PCV13) 10/25/2017 (Originally 12/26/2006)  . HEMOGLOBIN A1C  04/28/2017   Fall Risk  09/20/2016 03/21/2016 09/21/2015 11/23/2014  Falls in the past year? Yes Yes No No  Number falls in past yr: 1 2 or more - -  Injury with Fall? No No - -   Functional Status Survey:    He is afebrile pulse of 76 respirations  18 blood pressure notable for systolic of [id number]  Physical Exam   In general this is a very pleasant elderly male in no distress lying comfortably in bed.  His skin is warm and dry.  Oropharynx is clear mucous membranes moist.  Eyes pupils appear reactive to light extraocular movements intact .  Chest is clear to auscultation there is no labored breathing.  Heart is regular rate and rhythm with a 2/6 systolic murmur.  Abdomen is soft nontender he does have positive bowel sounds.  Musculoskeletal does have left-sided weakness with minimal to no movement of his left upper extremity he is able to move his left leg to a small extent of which a strength is 1-2 out of 5.  Right upper extremity strength appears to be intact right lower extremity strength intact as well he is status post right below the knee amputation.  Neurologic speech is slightly slurred he does have left-sided weakness as noted above he does have left facial weakness.  Psych he is alert and oriented very pleasant and appropriate    Labs reviewed:  Recent Labs  10/28/16 0146  11/13/16 0637 11/20/16 0742 11/22/16 0700  NA  --   < > 142 137 136  K  --   < > 3.5 3.4* 3.6  CL  --   < > 113* 107 106  CO2  --   < > 21* 23 24  GLUCOSE  --   < > 131* 114* 126*  BUN  --   < > 20 13 12   CREATININE  --   < > 0.91 0.68 0.71  CALCIUM  --   < > 8.4* 8.0* 8.2*  MG 2.0  --   --   --   --   < > = values in this interval not displayed.  Recent Labs  09/20/16 1339 10/27/16 2110 10/31/16 0526  AST 18 47* 23  ALT 14 32 22  ALKPHOS 36* 27* 27*  BILITOT 0.3 0.7 1.2  PROT 7.2 7.0 7.7  ALBUMIN 4.0 3.6 3.4*    Recent Labs  10/31/16 0526  11/11/16 1442 11/13/16 0637 11/20/16 0742 11/22/16 0700  WBC 10.3  < > 8.1 6.8 6.6 5.4  NEUTROABS 8.1*  --  5.8  --  4.4  --   HGB 13.6  < > 13.1 12.0* 11.4* 11.8*  HCT 40.1  < > 39.0 36.4* 32.9* 35.3*  MCV 97.3  < > 98.2 98.9 94.8 97.0  PLT 230  < > 359 323 263 300  < > =  values in this interval not displayed. Lab Results  Component Value Date   TSH 3.270 10/12/2014   Lab Results  Component Value Date   HGBA1C 5.9 (H) 10/28/2016   Lab Results  Component Value Date   CHOL 251 (H) 10/28/2016   HDL 43 10/28/2016   LDLCALC 180 (H) 10/28/2016   TRIG 139 10/28/2016   CHOLHDL 5.8 10/28/2016    Significant Diagnostic Results in last 30 days:  Ct Angio Head W Or Wo Contrast  Result Date: 10/28/2016 CLINICAL DATA:  75 year old male with right lenticulostriate territory acute infarcts on MRI performed for a left side weakness and fall. Initial encounter. EXAM: CT ANGIOGRAPHY HEAD AND NECK TECHNIQUE: Multidetector CT imaging of the head and neck was performed using the standard protocol during bolus administration of intravenous contrast. Multiplanar CT image reconstructions and MIPs were obtained to evaluate the vascular anatomy. Carotid stenosis measurements (when applicable) are obtained utilizing NASCET criteria, using the distal internal carotid diameter as the denominator. CONTRAST:  50 mL Isovue 370 COMPARISON:  Brain MRI 0956 hours today and head CT 10/27/2016. FINDINGS: CTA NECK Skeleton: Prior median sternotomy. No acute osseous abnormality identified. Visualized paranasal sinuses and mastoids are stable and well pneumatized. Upper chest: Centrilobular emphysema. Mild nonspecific dependent pulmonary opacity. No superior mediastinal lymphadenopathy. Other neck: Negative thyroid, larynx, pharynx, parapharyngeal spaces, retropharyngeal space, sublingual space, submandibular glands and parotid glands. No cervical lymphadenopathy. Negative orbit and scalp soft tissues. Aortic arch: Sequelae of CABG. Extensive arch calcified atherosclerosis. Three vessel arch configuration. Right carotid system: 7 mm diameter pseudoaneurysm along the proximal (right lateral) aspect of the brachia a cephalic artery as seen on series 8, image 121 and series 5, image 162. No associated  stenosis of the proximal brachiocephalic. Soft and calcified plaque of the brachiocephalic artery. No right CCA origin despite soft plaque. Soft and calcified plaque intermittently in the right CCA without stenosis proximal to the right carotid bifurcation. At the bifurcation there is calcified plaque with less than 50 % stenosis with respect to the distal vessel of the proximal right ICA. Calcified plaque in the distal cervical right ICA just below the skullbase also without stenosis. Left carotid system: No left CCA origin stenosis. Intermittent soft and calcified plaque in the left CCA without stenosis proximal to the bifurcation. At the left carotid bifurcation there is bulky in confluent calcified plaque resulting in a Radiographic String Sign Stenosis of the left ICA origin as seen on series 7, image 215 and series 8, image 102. Calcified plaque continues into the left ICA bulb. Negative cervical left ICA otherwise. Vertebral arteries:Soft and calcified plaque in the proximal right subclavian artery without stenosis. Soft plaque at the right vertebral artery origin without hemodynamically significant stenosis (series 7, image 298). Calcified plaque in the right V1 segment, and at the skullbase without associated stenosis. Soft and calcified plaque in the proximal left subclavian artery without hemodynamically significant stenosis. Soft plaque at the left vertebral artery origin without stenosis (series 8, image 146). Intermittent soft and calcified plaque in the left V2 segment with up to mild stenosis. CTA HEAD Posterior circulation: Bilateral distal vertebral artery calcified plaque. No hemodynamically significant stenosis of the distal right vertebral artery, but there  is up severe tandem stenosis of the left vertebral artery V4 segment (series 7, image 151) in the left PICA region and at the vertebrobasilar junction. The basilar remains patent. Both AICA origins appear patent. No basilar stenosis. Mild  basilar regularity. Normal SCA and PCA origins. Posterior communicating arteries are diminutive or absent. Mild right PCA irregularity. Severe left PCA P2 segment stenosis best seen on series 10, image 23 with preserved distal enhancement. Anterior circulation: Both ICA siphons are patent, but there is severe calcified siphon atherosclerosis. Still, there is no definite siphon hemodynamically significant stenosis. Normal ophthalmic artery origins. Patent carotid termini. Patent ACA origins. Mild irregularity and stenosis left ACA A1 segment. Anterior communicating artery and bilateral ACA branches are within normal limits. Left MCA origin and M1 segment are normal. Left MCA bifurcation is patent. Mild irregularity of the left MCA M3 branches. Right MCA origin is patent without stenosis, but there is moderate to severe stenosis in the mid right M1 segment and again at the right MCA bifurcation (series 16, image 26). No right discrete MCA branch occlusion is identified although there are is a paucity of enhancing anterior division right M2 branches (series 12, image 12). Venous sinuses: Patent. Anatomic variants: None. Delayed phase: Mild progression of confluent right basal ganglia hypodensity since the head CT yesterday. No associated hemorrhage or mass effect. Otherwise stable gray-white matter differentiation. No superimposed acute cortically based infarct identified. No abnormal enhancement identified. Review of the MIP images confirms the above findings IMPRESSION: 1. Severe atherosclerosis. Negative for emergent large vessel occlusion. 2. Pseudoaneurysm of the proximal Brachiocephalic Artery (see series 8, image 121). Recommend Vascular Surgery consultation. 3. 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 No discrete right MCA branch occlusion identified although there is a paucity of enhancing anterior division M2/M3 branches. 4. Extensive Right carotid  atherosclerosis without hemodynamically significant stenosis. 5. High-grade RADIOGRAPHIC STRING SIGN STENOSIS of the Left ICA origin due to bulky calcified plaque. 6. 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 7. Severe stenosis Left PCA P2 segment. 8. Expected evolution on CT of the acute right basal ganglia infarcts. No associated hemorrhage or mass effect. 9. Emphysema. Electronically Signed   By: Genevie Ann M.D.   On: 10/28/2016 13:00   Dg Chest 1 View  Result Date: 10/28/2016 CLINICAL DATA:  CVA with left-sided weakness. The patient ports nausea and vomiting. History of diabetes, hypertension, previous CP 80. EXAM: CHEST 1 VIEW COMPARISON:  Chest x-ray of October 12, 2014 FINDINGS: The lungs are less well inflated today. There is patchy density in the right perihilar region. There is no alveolar infiltrate. There is no pleural effusion. The cardiac silhouette is enlarged. There is some crowding of the central pulmonary vascularity. The patient has undergone previous CABG. There is calcification in the wall of the thoracic aorta. The observed bony structures exhibit no acute abnormalities. IMPRESSION: Mild hypo inflation accentuates the lung markings. However, there is likely low-grade CHF with mild interstitial edema. Mildly increased density in the right perihilar region may reflect subsegmental atelectasis. When the patient can tolerate the procedure, a PA and lateral chest x-ray with better inspiratory effort would be useful. Thoracic aortic atherosclerosis. Electronically Signed   By: David  Martinique M.D.   On: 10/28/2016 11:46   Ct Head Wo Contrast  Result Date: 10/27/2016 CLINICAL DATA:  Left-sided weakness.  Multiple recent falls. EXAM: CT HEAD WITHOUT CONTRAST TECHNIQUE: Contiguous axial images  were obtained from the base of the skull through the vertex without intravenous contrast. COMPARISON:  01/25/2016 FINDINGS: Brain: Negative for acute intracranial hemorrhage or  extra-axial fluid collection. There is focal hypodensity in the right caudate extending into the right lentiform nuclei, likely subacute infarction. No significant mass effect. Unchanged ventriculomegaly. Remote left corona radiata infarction, unchanged. Probable remote cerebellar infarctions, unchanged. Vascular: No hyperdense vessel or unexpected calcification. Skull: Normal. Negative for fracture or focal lesion. Sinuses/Orbits: No acute finding. IMPRESSION: 1. New focal hypodensity measuring approximately 2.5 cm in the right caudate, extending into the right lentiform nuclei, likely subacute infarction. No mass effect or midline shift. Recommend brain MRI to characterize. 2. Unchanged ventriculomegaly and remote infarctions involving the left corona radiata and both cerebellar hemispheres. Electronically Signed   By: Andreas Newport M.D.   On: 10/27/2016 23:17   Ct Angio Neck W Or Wo Contrast  Result Date: 10/28/2016 CLINICAL DATA:  75 year old male with right lenticulostriate territory acute infarcts on MRI performed for a left side weakness and fall. Initial encounter. EXAM: CT ANGIOGRAPHY HEAD AND NECK TECHNIQUE: Multidetector CT imaging of the head and neck was performed using the standard protocol during bolus administration of intravenous contrast. Multiplanar CT image reconstructions and MIPs were obtained to evaluate the vascular anatomy. Carotid stenosis measurements (when applicable) are obtained utilizing NASCET criteria, using the distal internal carotid diameter as the denominator. CONTRAST:  50 mL Isovue 370 COMPARISON:  Brain MRI 0956 hours today and head CT 10/27/2016. FINDINGS: CTA NECK Skeleton: Prior median sternotomy. No acute osseous abnormality identified. Visualized paranasal sinuses and mastoids are stable and well pneumatized. Upper chest: Centrilobular emphysema. Mild nonspecific dependent pulmonary opacity. No superior mediastinal lymphadenopathy. Other neck: Negative thyroid,  larynx, pharynx, parapharyngeal spaces, retropharyngeal space, sublingual space, submandibular glands and parotid glands. No cervical lymphadenopathy. Negative orbit and scalp soft tissues. Aortic arch: Sequelae of CABG. Extensive arch calcified atherosclerosis. Three vessel arch configuration. Right carotid system: 7 mm diameter pseudoaneurysm along the proximal (right lateral) aspect of the brachia a cephalic artery as seen on series 8, image 121 and series 5, image 162. No associated stenosis of the proximal brachiocephalic. Soft and calcified plaque of the brachiocephalic artery. No right CCA origin despite soft plaque. Soft and calcified plaque intermittently in the right CCA without stenosis proximal to the right carotid bifurcation. At the bifurcation there is calcified plaque with less than 50 % stenosis with respect to the distal vessel of the proximal right ICA. Calcified plaque in the distal cervical right ICA just below the skullbase also without stenosis. Left carotid system: No left CCA origin stenosis. Intermittent soft and calcified plaque in the left CCA without stenosis proximal to the bifurcation. At the left carotid bifurcation there is bulky in confluent calcified plaque resulting in a Radiographic String Sign Stenosis of the left ICA origin as seen on series 7, image 215 and series 8, image 102. Calcified plaque continues into the left ICA bulb. Negative cervical left ICA otherwise. Vertebral arteries:Soft and calcified plaque in the proximal right subclavian artery without stenosis. Soft plaque at the right vertebral artery origin without hemodynamically significant stenosis (series 7, image 298). Calcified plaque in the right V1 segment, and at the skullbase without associated stenosis. Soft and calcified plaque in the proximal left subclavian artery without hemodynamically significant stenosis. Soft plaque at the left vertebral artery origin without stenosis (series 8, image 146).  Intermittent soft and calcified plaque in the left V2 segment with up to mild stenosis. CTA  HEAD Posterior circulation: Bilateral distal vertebral artery calcified plaque. No hemodynamically significant stenosis of the distal right vertebral artery, but there is up severe tandem stenosis of the left vertebral artery V4 segment (series 7, image 151) in the left PICA region and at the vertebrobasilar junction. The basilar remains patent. Both AICA origins appear patent. No basilar stenosis. Mild basilar regularity. Normal SCA and PCA origins. Posterior communicating arteries are diminutive or absent. Mild right PCA irregularity. Severe left PCA P2 segment stenosis best seen on series 10, image 23 with preserved distal enhancement. Anterior circulation: Both ICA siphons are patent, but there is severe calcified siphon atherosclerosis. Still, there is no definite siphon hemodynamically significant stenosis. Normal ophthalmic artery origins. Patent carotid termini. Patent ACA origins. Mild irregularity and stenosis left ACA A1 segment. Anterior communicating artery and bilateral ACA branches are within normal limits. Left MCA origin and M1 segment are normal. Left MCA bifurcation is patent. Mild irregularity of the left MCA M3 branches. Right MCA origin is patent without stenosis, but there is moderate to severe stenosis in the mid right M1 segment and again at the right MCA bifurcation (series 16, image 26). No right discrete MCA branch occlusion is identified although there are is a paucity of enhancing anterior division right M2 branches (series 12, image 12). Venous sinuses: Patent. Anatomic variants: None. Delayed phase: Mild progression of confluent right basal ganglia hypodensity since the head CT yesterday. No associated hemorrhage or mass effect. Otherwise stable gray-white matter differentiation. No superimposed acute cortically based infarct identified. No abnormal enhancement identified. Review of the MIP  images confirms the above findings IMPRESSION: 1. Severe atherosclerosis. Negative for emergent large vessel occlusion. 2. Pseudoaneurysm of the proximal Brachiocephalic Artery (see series 8, image 121). Recommend Vascular Surgery consultation. 3. 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 No discrete right MCA branch occlusion identified although there is a paucity of enhancing anterior division M2/M3 branches. 4. Extensive Right carotid atherosclerosis without hemodynamically significant stenosis. 5. High-grade RADIOGRAPHIC STRING SIGN STENOSIS of the Left ICA origin due to bulky calcified plaque. 6. 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 7. Severe stenosis Left PCA P2 segment. 8. Expected evolution on CT of the acute right basal ganglia infarcts. No associated hemorrhage or mass effect. 9. Emphysema. Electronically Signed   By: Genevie Ann M.D.   On: 10/28/2016 13:00   Mr Brain Wo Contrast  Result Date: 10/28/2016 CLINICAL DATA:  Left-sided weakness.  Fall at home. EXAM: MRI HEAD WITHOUT CONTRAST TECHNIQUE: Multiplanar, multiecho pulse sequences of the brain and surrounding structures were obtained without intravenous contrast. COMPARISON:  Head CT from yesterday FINDINGS: Brain: Restricted diffusion in a wedge-shaped area extending from the right anterior putamen through the anterior limb internal capsule to the caudate head. Distinct second area of acute infarct or posteriorly with the same morphology, extending from putamen to caudate body. No acute hemorrhage. Chronic microvascular disease with moderate ischemic gliosis throughout cerebral white matter. Small remote left frontal infarct at the vertex. There is lateral and third ventriculomegaly that is likely from atrophy. No temporal horn dilatation typical of an obstructive for communicating hydrocephalus. Vascular: Preserved flow voids.  No focal marrow lesion. Skull  and upper cervical spine: Negative Sinuses/Orbits: Negative IMPRESSION: 1. Two acute infarcts in the right lateral lenticulostriate distribution. 2. Chronic microvascular disease and small remote left frontal infarct. 3. Lateral and third ventriculomegaly, favor atrophy over communicating hydrocephalus. Electronically  Signed   By: Monte Fantasia M.D.   On: 10/28/2016 11:24   Dg Swallowing Func-speech Pathology  Result Date: 11/20/2016 Objective Swallowing Evaluation: Type of Study: MBS-Modified Barium Swallow Study Patient Details Name: Yeiden Vanwhy MRN: [id number] Date of Birth: 09-Sep-1941 Today's Date: 11/20/2016 Time: SLP Start Time 0935 -SLP Stop Time 0957 SLP Time Calculation (min) (ACUTE ONLY): 22 min Past Medical History: Past Medical History: Diagnosis Date . Arthritis  . CAD (coronary artery disease)   2014 75% left main stenosis, LAD 80% followed by a mid subtotal stenosis, circumflex 90% stenosis, right coronary artery occluded, SVG to RCA occluded, SVG ramus occluded, SVG to OM patent without a sequential anastomosis noted. Graft had a focal 99% stenosis which was stented. 3.5 x 16 mm premier. b. cath 10/13/2014 DES to SVG to OM overlap previous stent . Cholelithiasis 2014 . Cogan syndrome  . Colon polyps  . Diabetes mellitus without complication ([place])  . Dyslipidemia  . Gallstones  . GERD (gastroesophageal reflux disease)  . Hepatic hemangioma 2014 . 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 . Hypertension  . Kidney stones  . Myocardial infarction 1999 . Necrotizing fasciitis ([place])  . Peptic ulcer disease  . Peripheral vascular disease [facility])  Past Surgical History: Past Surgical History: Procedure Laterality Date . APPENDECTOMY  2007 . CORONARY ARTERY BYPASS GRAFT  1999  LIMA to the LAD, saphenous vein graft to obtuse marginal 1 and obtuse marginal 2, spahenous vein graft to intermediate, spahenous vein graft to the right coronary artery in 1999 . LEFT HEART CATHETERIZATION WITH  CORONARY ANGIOGRAM N/A 10/13/2014  Procedure: LEFT HEART CATHETERIZATION WITH CORONARY ANGIOGRAM;  Surgeon: Peter M Martinique, MD;  Location: [facility] CATH LAB;  Service: Cardiovascular;  Laterality: N/A; . LEFT HEART CATHETERIZATION WITH CORONARY/GRAFT ANGIOGRAM N/A 07/19/2013  Procedure: LEFT HEART CATHETERIZATION WITH Beatrix Fetters;  Surgeon: Peter M Martinique, MD;  Location: [facility] CATH LAB;  Service: Cardiovascular;  Laterality: N/A; . LEG AMPUTATION  1999  Right BKA . PERCUTANEOUS CORONARY STENT INTERVENTION (PCI-S)  07/19/2013  Procedure: PERCUTANEOUS CORONARY STENT INTERVENTION (PCI-S);  Surgeon: Peter M Martinique, MD;  Location: [facility] CATH LAB;  Service: Cardiovascular;; HPI: Kaniela Summerlinis a 75 y.o.malewith medical history significant for coronary artery disease status post CABG, type 2 diabetes mellitus, hypertension, hyperlipidemia, and GERD who presents to the ED with weakness involving the left face, left arm, and left leg resulting in a fall. 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 CXR mild hypo inflation accentuates the lung markings, likely low-grade CHF with mild interstitial edema. Mildly increased density in the right perihilar region may reflect subsegmental atelectasis.  Pt admitted to CIR and diet has been gradually advanced back to dys 2, honey thick liquids after initial downgrade on admission due to poor toleration of solids.  Repeat objective assessment ordered today to determine readiness for advancement given improvements noted at bedside.   No Data Recorded Assessment / Plan / Recommendation CHL IP CLINICAL IMPRESSIONS 11/20/2016 Therapy Diagnosis Moderate oral phase dysphagia;Mild pharyngeal phase dysphagia Clinical Impression Pt presents with improvements in swallowing function in comparison to previous  study which SLP suspects to be a function of improved attention to boluses.  Pt continues to present with a mild-moderate oropharyngeal dysphagia.  Oral phase deficits are characterized by impaired containment and transit of boluses due to left sided weakness.  Pt has decreased  cohesion of thin liquid boluses due to base of tongue weakness which leads to premature spillage into the pharynx with swallow response triggered at the level of the pyriforms.  Bolus cohesion was improved with heavier viscosities and response was triggered at the vallecula without aspiration or penetration.  Mild diffuse residuals were noted throughout pharynx with thicker>thinner viscosities due to weakness which was cleared with cues for extra swallows.  No significant airway compromise evident with thin liqiuds during study; however, pt was noted with immediate coughing after study when consuming water to clear the oral cavity.  As a result, recommend upgrading pt to nectar thick liquids with sips of water in between meals per the water protocol to continue working towards progression.  Prognosis for ongoing advancement good with continued improved cognitive functioning and skilled ST interventions for management of safe diet upgrade. Impact on safety and function Mild aspiration risk     Prognosis 11/20/2016 Prognosis for Safe Diet Advancement Good Barriers to Reach Goals -- Barriers/Prognosis Comment -- CHL IP DIET RECOMMENDATION 11/20/2016 SLP Diet Recommendations Dysphagia 2 (Fine chop) solids;Nectar thick liquid Liquid Administration via Cup Medication Administration Crushed with puree Compensations Slow rate;Small sips/bites;Minimize environmental distractions;Lingual sweep for clearance of pocketing;Multiple dry swallows after each bite/sip Postural Changes Seated upright at 90 degrees            CHL IP ORAL PHASE 11/20/2016 Oral Phase Impaired Oral - Pudding Teaspoon -- Oral - Pudding Cup -- Oral - Honey Teaspoon -- Oral - Honey  Cup Left anterior bolus loss;Weak lingual manipulation;Reduced posterior propulsion;Premature spillage;Delayed oral transit Oral - Nectar Teaspoon -- Oral - Nectar Cup Left anterior bolus loss;Weak lingual manipulation;Reduced posterior propulsion;Premature spillage;Delayed oral transit Oral - Nectar Straw -- Oral - Thin Teaspoon -- Oral - Thin Cup Left anterior bolus loss;Weak lingual manipulation;Reduced posterior propulsion;Decreased bolus cohesion;Premature spillage;Delayed oral transit Oral - Thin Straw Decreased bolus cohesion;Premature spillage Oral - Puree Delayed oral transit;Premature spillage Oral - Mech Soft -- Oral - Regular Delayed oral transit Oral - Multi-Consistency -- Oral - Pill -- Oral Phase - Comment --  CHL IP PHARYNGEAL PHASE 11/20/2016 Pharyngeal Phase Impaired Pharyngeal- Pudding Teaspoon -- Pharyngeal -- Pharyngeal- Pudding Cup -- Pharyngeal -- Pharyngeal- Honey Teaspoon -- Pharyngeal -- Pharyngeal- Honey Cup Delayed swallow initiation-vallecula;Reduced tongue base retraction;Pharyngeal residue - pyriform;Pharyngeal residue - posterior pharnyx;Lateral channel residue;Pharyngeal residue - valleculae Pharyngeal -- Pharyngeal- Nectar Teaspoon -- Pharyngeal -- Pharyngeal- Nectar Cup Delayed swallow initiation-vallecula;Reduced tongue base retraction;Pharyngeal residue - pyriform;Pharyngeal residue - posterior pharnyx;Lateral channel residue;Pharyngeal residue - valleculae Pharyngeal -- Pharyngeal- Nectar Straw -- Pharyngeal -- Pharyngeal- Thin Teaspoon -- Pharyngeal -- Pharyngeal- Thin Cup Delayed swallow initiation-pyriform sinuses;Reduced tongue base retraction;Pharyngeal residue - pyriform Pharyngeal -- Pharyngeal- Thin Straw -- Pharyngeal -- Pharyngeal- Puree Delayed swallow initiation-vallecula;Pharyngeal residue - valleculae;Pharyngeal residue - pyriform;Pharyngeal residue - posterior pharnyx;Reduced tongue base retraction Pharyngeal -- Pharyngeal- Mechanical Soft -- Pharyngeal --  Pharyngeal- Regular Delayed swallow initiation-vallecula;Reduced tongue base retraction;Pharyngeal residue - pyriform Pharyngeal -- Pharyngeal- Multi-consistency -- Pharyngeal -- Pharyngeal- Pill -- Pharyngeal -- Pharyngeal Comment --  No flowsheet data found. Page, Selinda Orion 11/20/2016, 3:41 PM              Dg Swallowing Func-speech Pathology  Result Date: 10/29/2016 Objective Swallowing Evaluation: Type of Study: MBS-Modified Barium Swallow Study Patient Details Name: Avett Carazo MRN: [id number] Date of Birth: 09/26/1941 Today's Date: 10/29/2016 Time: SLP Start Time (ACUTE ONLY): 0911-SLP Stop Time (ACUTE ONLY): 0931 SLP Time Calculation (min) (ACUTE ONLY): 20 min Past Medical  History: Past Medical History: Diagnosis Date . Arthritis  . CAD (coronary artery disease)   2014 75% left main stenosis, LAD 80% followed by a mid subtotal stenosis, circumflex 90% stenosis, right coronary artery occluded, SVG to RCA occluded, SVG ramus occluded, SVG to OM patent without a sequential anastomosis noted. Graft had a focal 99% stenosis which was stented. 3.5 x 16 mm premier. b. cath 10/13/2014 DES to SVG to OM overlap previous stent . Cholelithiasis 2014 . Cogan syndrome  . Colon polyps  . Diabetes mellitus without complication ([place])  . Dyslipidemia  . Gallstones  . GERD (gastroesophageal reflux disease)  . Hepatic hemangioma 2014 . 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 . Hypertension  . Kidney stones  . Myocardial infarction 1999 . Necrotizing fasciitis ([place])  . Peptic ulcer disease  . Peripheral vascular disease [facility])  Past Surgical History: Past Surgical History: Procedure Laterality Date . APPENDECTOMY  2007 . CORONARY ARTERY BYPASS GRAFT  1999  LIMA to the LAD, saphenous vein graft to obtuse marginal 1 and obtuse marginal 2, spahenous vein graft to intermediate, spahenous vein graft to the right coronary artery in 1999 . LEFT HEART CATHETERIZATION WITH CORONARY ANGIOGRAM N/A 10/13/2014  Procedure: LEFT  HEART CATHETERIZATION WITH CORONARY ANGIOGRAM;  Surgeon: Peter M Martinique, MD;  Location: [facility] CATH LAB;  Service: Cardiovascular;  Laterality: N/A; . LEFT HEART CATHETERIZATION WITH CORONARY/GRAFT ANGIOGRAM N/A 07/19/2013  Procedure: LEFT HEART CATHETERIZATION WITH Beatrix Fetters;  Surgeon: Peter M Martinique, MD;  Location: [facility] CATH LAB;  Service: Cardiovascular;  Laterality: N/A; . LEG AMPUTATION  1999  Right BKA . PERCUTANEOUS CORONARY STENT INTERVENTION (PCI-S)  07/19/2013  Procedure: PERCUTANEOUS CORONARY STENT INTERVENTION (PCI-S);  Surgeon: Peter M Martinique, MD;  Location: [facility] CATH LAB;  Service: Cardiovascular;; HPI: Abdallah Summerlinis a 75 y.o.malewith medical history significant for coronary artery disease status post CABG, type 2 diabetes mellitus, hypertension, hyperlipidemia, and GERD who presents to the ED with weakness involving the left face, left arm, and left leg resulting in a fall. 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 CXR mild hypo inflation accentuates the lung markings, likely low-grade CHF with mild interstitial edema. Mildly increased density in the right perihilar region may reflect subsegmental atelectasis. No Data Recorded Assessment / Plan / Recommendation CHL IP CLINICAL IMPRESSIONS 10/29/2016 Therapy Diagnosis -- Clinical Impression Moderate oral phase dysphagia marked by decreased bolus cohesion with left side spill and facial residue and delayed oral transit. Mild pharyngeal phase deficits are sensory based with swallow iniatiating at the level of the valleculae and pyriform sinuses. Pilar Plate and silent laryngeal penetration of nectar before and during swallow not consistently prevented with multiple trials chin tuck posture. Verbal cues to cough/throat clear intermittently cleared penetrates. Mild  vallecular residue. Recommend Dys 2 texture, honey thick liquids, swallow twice, pills whole in applesauce, check oral cavity for pocketed food. ST will continue intervention.    Impact on safety and function Moderate aspiration risk   CHL IP TREATMENT RECOMMENDATION 10/29/2016 Treatment Recommendations Therapy as outlined in treatment plan below   Prognosis 10/29/2016 Prognosis for Safe Diet Advancement Good Barriers to Reach Goals -- Barriers/Prognosis Comment -- CHL IP DIET RECOMMENDATION 10/29/2016 SLP Diet Recommendations Dysphagia 2 (Fine chop) solids;Honey thick liquids Liquid Administration via Cup Medication Administration Whole meds with puree Compensations Slow rate;Small sips/bites;Minimize environmental distractions;Lingual sweep for clearance of pocketing;Multiple dry  swallows after each bite/sip Postural Changes Remain semi-upright after after feeds/meals (Comment);Seated upright at 90 degrees   CHL IP OTHER RECOMMENDATIONS 10/29/2016 Recommended Consults -- Oral Care Recommendations Oral care BID Other Recommendations Order thickener from pharmacy   CHL IP FOLLOW UP RECOMMENDATIONS 10/29/2016 Follow up Recommendations Inpatient Rehab   CHL IP FREQUENCY AND DURATION 10/29/2016 Speech Therapy Frequency (ACUTE ONLY) min 2x/week Treatment Duration 2 weeks      CHL IP ORAL PHASE 10/29/2016 Oral Phase Impaired Oral - Pudding Teaspoon -- Oral - Pudding Cup -- Oral - Honey Teaspoon -- Oral - Honey Cup Left anterior bolus loss Oral - Nectar Teaspoon -- Oral - Nectar Cup Left anterior bolus loss Oral - Nectar Straw -- Oral - Thin Teaspoon -- Oral - Thin Cup -- Oral - Thin Straw -- Oral - Puree -- Oral - Mech Soft -- Oral - Regular Delayed oral transit Oral - Multi-Consistency -- Oral - Pill -- Oral Phase - Comment --  CHL IP PHARYNGEAL PHASE 10/29/2016 Pharyngeal Phase Impaired Pharyngeal- Pudding Teaspoon -- Pharyngeal -- Pharyngeal- Pudding Cup -- Pharyngeal -- Pharyngeal- Honey Teaspoon -- Pharyngeal -- Pharyngeal-  Honey Cup Delayed swallow initiation-vallecula;Pharyngeal residue - valleculae;Reduced tongue base retraction;Delayed swallow initiation-pyriform sinuses Pharyngeal -- Pharyngeal- Nectar Teaspoon -- Pharyngeal -- Pharyngeal- Nectar Cup Delayed swallow initiation-vallecula;Delayed swallow initiation-pyriform sinuses;Penetration/Aspiration during swallow;Pharyngeal residue - valleculae;Reduced tongue base retraction;Compensatory strategies attempted (with notebox) Pharyngeal Material enters airway, remains ABOVE vocal cords and not ejected out Pharyngeal- Nectar Straw -- Pharyngeal -- Pharyngeal- Thin Teaspoon -- Pharyngeal -- Pharyngeal- Thin Cup -- Pharyngeal -- Pharyngeal- Thin Straw -- Pharyngeal -- Pharyngeal- Puree NT Pharyngeal -- Pharyngeal- Mechanical Soft NT Pharyngeal -- Pharyngeal- Regular Pharyngeal residue - valleculae;Reduced tongue base retraction Pharyngeal Material does not enter airway Pharyngeal- Multi-consistency NT Pharyngeal -- Pharyngeal- Pill NT Pharyngeal -- Pharyngeal Comment --  CHL IP CERVICAL ESOPHAGEAL PHASE 10/29/2016 Cervical Esophageal Phase (No Data) Pudding Teaspoon -- Pudding Cup -- Honey Teaspoon -- Honey Cup -- Nectar Teaspoon -- Nectar Cup -- Nectar Straw -- Thin Teaspoon -- Thin Cup -- Thin Straw -- Puree -- Mechanical Soft -- Regular -- Multi-consistency -- Pill -- Cervical Esophageal Comment -- No flowsheet data found. Houston Siren 10/29/2016, 10:26 AM Orbie Pyo Colvin Caroli.Ed CCC-SLP Pager [phone]               Assessment/Plan  1 history of nondominant right basal ganglia infarct-he continues on aspirin as well as Plavix with recommendation to just continue with Plavix after 3 months.  He will need PT and OT-he appears to have a very good attitude in this regards which is encouraging-also recommendation for 30 day cardiac event monitoring as an outpatient.  He also continues on a statin.  Left ICA high-grade stenosis which is asymptomatic follow-up as  outpatient to consider elective CEA if he recovers well from his current stroke.  #3 hypertension this appears stable again orders for permissive hypertension with gradual normalization and 5-7 days-long-term blood pressure goal systolically [id number] due to left ICA stenosis.--  #4 hyperlipidemia LDL was 180 in the hospital goal is less than 70 his Lipitor was increased up to 80 mg a day.  #5 history of muscle spasms he is on baclofen when necessary at this point monitor.  #6 history of suspected urinary retention question BPH  is on Flomax as well as pro-scar.  #7 history depression he continues on Prozac  History of hypokalemia-this is being supplemented will update lab early next week to ensure stability  [id number]  note greater than 35 minutes spent assessing patient-reviewing his chart-reviewing his labs-and coordinating formulating a plan of care for numerous diagnoses-of note greater than 50% of time spent coordinating plan of care         Oralia Manis, [place]

## 2016-11-26 ENCOUNTER — Encounter: Payer: Self-pay | Admitting: Internal Medicine

## 2016-11-26 ENCOUNTER — Non-Acute Institutional Stay (SKILLED_NURSING_FACILITY): Payer: Medicare Other | Admitting: Internal Medicine

## 2016-11-26 DIAGNOSIS — R066 Hiccough: Secondary | ICD-10-CM | POA: Diagnosis not present

## 2016-11-26 DIAGNOSIS — I10 Essential (primary) hypertension: Secondary | ICD-10-CM

## 2016-11-26 DIAGNOSIS — I639 Cerebral infarction, unspecified: Secondary | ICD-10-CM

## 2016-11-26 DIAGNOSIS — G8194 Hemiplegia, unspecified affecting left nondominant side: Secondary | ICD-10-CM

## 2016-11-26 DIAGNOSIS — E1159 Type 2 diabetes mellitus with other circulatory complications: Secondary | ICD-10-CM

## 2016-11-26 NOTE — Progress Notes (Signed)
Provider:  Veleta Miners Location:   [place] Room Number: 128/P Place of Service:  SNF (31)  PCP: Wardell Honour, MD Patient Care Team: Wardell Honour, MD as PCP - General (Family Medicine) Chipper Herb, MD as Attending Physician (Family Medicine) Minus Breeding, MD as Consulting Physician (Cardiology)  Extended Emergency Contact Information Primary Emergency Contact: Jessop,Carol Address: [place], [place] [address] [place] Phone: [phone] Mobile Phone: [phone] Relation: Spouse Secondary Emergency Contact: [place] Mobile Phone: [phone] Relation: Son  Code Status: Full Code Goals of Care: Advanced Directive information Advanced Directives 11/26/2016  Does Patient Have a Medical Advance Directive? Yes  Type of Advance Directive (No Data)  Does patient want to make changes to medical advance directive? No - Patient declined  Copy of [place] in Chart? -  Would patient like information on creating a medical advance directive? No - Patient declined  Pre-existing out of facility DNR order (yellow form or pink MOST form) -      Chief Complaint  Patient presents with  . New Admit To SNF    HPI: Patient is a 76 y.o. male seen today for admission to SNF for therapy. 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 Patient was brought to the hospital on 12/03 with Sudden Onset of Left  Sided weakness involving Left arm and leg. MRI of head showed Two acute infarcts in the right lateral lenticulostriate distribution. And [place] remote Frontal infarct.. CT angio showed Pseudoaneurysm of the proximal Brachiocephalic Artery ,Right carotid atherosclerosis without hemodynamically significant stenosis..  High-grade  STENOSIS of the Left ICA , stenoses of the Right MCA mid M1 segment and at the Right MCA bifurcation .  Patient BP was high in the hospital and was slowly titrated down. Hydralazine was added . He also had severe Dysphagia which did improve and his diet was advanced to Dysphagia 2 Nectar Liquids. He did get dehydrated initially due to decreased intake but eventually improved his oral intake. His HGB was slowly coming coming down but is now stable. His LDL was high and his lipitor was increased. His Hiccups also responded to baclofen. He was Initially discharged to Inpatient Rehab but then was discharged to SNF as Wife unable to take care of him at home. Patient is doing well. He just seems little concerned that his strength in left side is not coming back fast enough. Patient was very independent driving before this episode. He wants to eventually go home with his wife.     Past Medical History:  Diagnosis Date  . Arthritis   . CAD (coronary artery disease)    2014 75% left main stenosis, LAD 80% followed by a mid subtotal stenosis, circumflex 90% stenosis, right coronary artery occluded, SVG to RCA occluded, SVG ramus occluded, SVG to OM patent without a sequential anastomosis noted. Graft had a focal 99% stenosis which was stented. 3.5 x 16 mm premier. b. cath 10/13/2014 DES to SVG to OM overlap previous stent  . Cholelithiasis 2014  . Cogan syndrome   . Colon polyps   . Diabetes mellitus without complication ([place])   . Dyslipidemia   . Gallstones   . GERD (gastroesophageal reflux disease)   . Hepatic hemangioma 2014  .  Hx of right BKA ([place])    a. 2/2 necrotizing fasciitis.  . Hypertension   . Kidney stones   . Myocardial infarction 1999  . Necrotizing fasciitis ([place])   . Peptic ulcer disease   . Peripheral vascular disease [facility])    Past Surgical History:  Procedure Laterality Date  . APPENDECTOMY  2007  . CORONARY ARTERY BYPASS GRAFT  1999   LIMA to the LAD, saphenous  vein graft to obtuse marginal 1 and obtuse marginal 2, spahenous vein graft to intermediate, spahenous vein graft to the right coronary artery in 1999  . LEFT HEART CATHETERIZATION WITH CORONARY ANGIOGRAM N/A 10/13/2014   Procedure: LEFT HEART CATHETERIZATION WITH CORONARY ANGIOGRAM;  Surgeon: Peter M Martinique, MD;  Location[facility] C[facility] LAB;  Service: Cardiovascular;  Laterality: N/A;  . LEFT HEART CATHETERIZATION WITH CORONARY/GRAFT ANGIOGRAM N/A 07/19/2013   Procedure: LEFT HEART CATHETERIZATION WITH Beatrix Fetters;  Surgeon: Peter M Martinique, MD;  Location: [facility] CATH LAB;  Service: Cardiovascular;  Laterality: N/A;  . LEG AMPUTATION  1999   Right BKA  . PERCUTANEOUS CORONARY STENT INTERVENTION (PCI-S)  07/19/2013   Procedure: PERCUTANEOUS CORONARY STENT INTERVENTION (PCI-S);  Surgeon: Peter M Martinique, MD;  Location: [facility] CATH LAB;  Service: Cardiovascular;;    reports that he quit smoking about 19 years ago. His smoking use included Cigarettes. He started smoking about 53 years ago. He has never used smokeless tobacco. He reports that he drinks about 8.4 oz of alcohol per week . He reports that he does not use drugs. Social History   Social History  . Marital status: Married    Spouse name: N/A  . Number of children: 2  . 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 Smoking status: Former Smoker    Types: Cigarettes    Start date: 12/26/1962    Quit date: 11/25/1997  . Smokeless tobacco: Never Used  . Alcohol use 8.4 oz/week    14 Shots of liquor per week     Comment: daily   . Drug use: No  . Sexual activity: Not on file   Other Topics Concern  . Not on file   Social History Narrative   Lives in [place] with his wife    Functional Status Survey:    Family History  Problem Relation Age of Onset  . Hyperlipidemia Mother   . Hypertension Mother   . Other Mother     varicose veins  . Heart disease Mother    . Rheum arthritis Father   . Heart attack Father   . Heart disease Maternal Grandfather   . Diabetes Sister   . Colon cancer Neg Hx     Health Maintenance  Topic Date Due  . FOOT EXAM  [birth year]/27/1951  . OPHTHALMOLOGY EXAM  [birth year]27/1951  . TETANUS/TDAP  12/26/1960  . ZOSTAVAX  12/26/2001  . COLONOSCOPY  03/25/2016  . URINE MICROALBUMIN  09/20/2016  . INFLUENZA VACCINE  02/22/2017 (Originally 06/25/2016)  . PNA vac Low Risk Adult (1 of 2 - PCV13) 10/25/2017 (Originally 12/26/2006)  . HEMOGLOBIN A1C  04/28/2017    Allergies  Allergen Reactions  . Altace [Ramipril] Other (See Comments)    hypotension  . Ambien [Zolpidem Tartrate] Other (See Comments)    Sleep walking    Current Outpatient Prescriptions on File Prior to Visit  Medication Sig Dispense Refill  . acetaminophen (TYLENOL) 325 MG tablet Take 650 mg by  mouth every 6 (six) hours as needed.    . Amino Acids-Protein Hydrolys (FEEDING SUPPLEMENT, PRO-STAT SUGAR FREE 64,) LIQD Take 30 mLs by mouth 2 (two) times daily. 900 mL 0  . amLODipine (NORVASC) 10 MG tablet Take 1 tablet (10 mg total) by mouth daily after supper.    Marland Kitchen aspirin 325 MG tablet Take 1 tablet (325 mg total) by mouth daily.    Marland Kitchen atorvastatin (LIPITOR) 80 MG tablet Take 1 tablet (80 mg total) by mouth daily at 6 PM.    . baclofen (LIORESAL) 10 mg/mL SUSP Take 0.25 mLs (2.5 mg total) by mouth 4 (four) times daily as needed (hiccups).    . Calcium Carbonate-Vitamin D (CALCIUM-CARB 600 + D) 600-125 MG-UNIT TABS Take 1 tablet by mouth daily.      . chlorhexidine (PERIDEX) 0.12 % solution 15 mLs by Mouth Rinse route 2 (two) times daily. 120 mL 0  . clopidogrel (PLAVIX) 75 MG tablet Take 1 tablet (75 mg total) by mouth daily.    . finasteride (PROSCAR) 5 MG tablet Take 5 mg by mouth daily.    Marland Kitchen FLUoxetine (PROZAC) 20 MG capsule Take 1 capsule (20 mg total) by mouth daily.  3  . food thickener (THICK IT) POWD prn  0  . hydrALAZINE (APRESOLINE) 50 MG tablet Take 1 tablet (50  mg total) by mouth every 8 (eight) hours.    . Multiple Vitamin (MULTIVITAMIN) tablet Take 1 tablet by mouth daily.      . potassium chloride SA (K-DUR,KLOR-CON) 20 MEQ tablet Take 1 tablet (20 mEq total) by mouth 2 (two) times daily.    . Probiotic Product (RESTORA) CAPS Take 1 capsule by mouth daily. 30 capsule 6  . ranitidine (ZANTAC) 75 MG tablet Take 75-150 mg by mouth See admin instructions. 75 mg in the am, 150 mg at bedtime    . senna-docusate (SENOKOT-S) 8.6-50 MG tablet Take 2 tablets by mouth 3 (three) times daily.    . tamsulosin (FLOMAX) 0.4 MG CAPS Take 0.8 mg by mouth daily.      No current facility-administered medications on file prior to visit.      Review of Systems  Constitutional: Positive for activity change and appetite change. Negative for chills, diaphoresis, fatigue, fever and unexpected weight change.  HENT: Negative for congestion, postnasal drip, rhinorrhea, sinus pain and sinus pressure.   Respiratory: Positive for cough. Negative for apnea, choking, chest tightness, shortness of breath and stridor.   Cardiovascular: Negative for chest pain, palpitations and leg swelling.  Gastrointestinal: Negative for abdominal distention, abdominal pain, anal bleeding, blood in stool, constipation and nausea.       C/o Some loose stools  Genitourinary: Negative for dysuria, flank pain, frequency and urgency.  Musculoskeletal: Negative.  Negative for arthralgias and back pain.  Skin: Negative.  Negative for color change, pallor, rash and wound.  Neurological: Positive for facial asymmetry, speech difficulty and weakness. Negative for dizziness, light-headedness, numbness and headaches.  Psychiatric/Behavioral: Positive for dysphoric mood. Negative for agitation, confusion, decreased concentration and sleep disturbance. The patient is not nervous/anxious.     Vitals:   11/26/16 1003  BP: 134/67  Pulse: 80  Resp: 20  Temp: 98.2 F (36.8 C)  TempSrc: Oral   There is no  height or weight on file to calculate BMI. Physical Exam  Constitutional: He is oriented to person, place, and time. He appears well-developed and well-nourished.  HENT:  Head: Normocephalic.  Mouth/Throat: Oropharynx is clear and moist.  Eyes:  Pupils are equal, round, and reactive to light.  Neck: Neck supple.  Cardiovascular: Normal rate and regular rhythm.   No murmur heard. Pulmonary/Chest: Effort normal and breath sounds normal. No respiratory distress. He has no wheezes. He has no rales.  Abdominal: Soft. Bowel sounds are normal. He exhibits no distension. There is no tenderness. There is no guarding.  Musculoskeletal: He exhibits no edema.  Lymphadenopathy:    He has no cervical adenopathy.  Neurological: He is alert and oriented to person, place, and time.  Reflex Scores:      Tricep reflexes are 4+ on the right side and 0 on the left side.      Bicep reflexes are 4+ on the right side and 0 on the left side.      Brachioradialis reflexes are 4+ on the right side and 0 on the left side.      Patellar reflexes are 4+ on the right side and 0 on the left side.      Achilles reflexes are 4+ on the right side and 0 on the left side. Had good sensation in All extremities. Facial droop was present. Speech had mild dysarthria.  Skin: Skin is warm and dry. No rash noted. No erythema.  Psychiatric: He has a normal mood and affect. His behavior is normal. Judgment and thought content normal.    Labs reviewed: Basic Metabolic Panel:  Recent Labs  10/28/16 0146  11/13/16 0637 11/20/16 0742 11/22/16 0700  NA  --   < > 142 137 136  K  --   < > 3.5 3.4* 3.6  CL  --   < > 113* 107 106  CO2  --   < > 21* 23 24  GLUCOSE  --   < > 131* 114* 126*  BUN  --   < > 20 13 12   CREATININE  --   < > 0.91 0.68 0.71  CALCIUM  --   < > 8.4* 8.0* 8.2*  MG 2.0  --   --   --   --   < > = values in this interval not displayed. Liver Function Tests:  Recent Labs  09/20/16 1339 10/27/16 2110  10/31/16 0526  AST 18 47* 23  ALT 14 32 22  ALKPHOS 36* 27* 27*  BILITOT 0.3 0.7 1.2  PROT 7.2 7.0 7.7  ALBUMIN 4.0 3.6 3.4*   No results for input(s): LIPASE, AMYLASE in the last 8760 hours. No results for input(s): AMMONIA in the last 8760 hours. CBC:  Recent Labs  10/31/16 0526  11/11/16 1442 11/13/16 0637 11/20/16 0742 11/22/16 0700  WBC 10.3  < > 8.1 6.8 6.6 5.4  NEUTROABS 8.1*  --  5.8  --  4.4  --   HGB 13.6  < > 13.1 12.0* 11.4* 11.8*  HCT 40.1  < > 39.0 36.4* 32.9* 35.3*  MCV 97.3  < > 98.2 98.9 94.8 97.0  PLT 230  < > 359 323 263 300  < > = values in this interval not displayed. Cardiac Enzymes: No results for input(s): CKTOTAL, CKMB, CKMBINDEX, TROPONINI in the last 8760 hours. BNP: Invalid input(s): POCBNP Lab Results  Component Value Date   HGBA1C 5.9 (H) 10/28/2016   Lab Results  Component Value Date   TSH 3.270 10/12/2014   No results found for: VITAMINB12 No results found for: FOLATE No results found for: IRON, TIBC, FERRITIN  Imaging and Procedures obtained prior to SNF admission: No results found.  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  Patient is started on Plavix added to aspirin. His Heart 2D echo was normal with no reason for Emboli. He needs Heart monitor for 30 dys per cardiology. Will try to see if it can be arranged in facility.  Patient is getting therapy   Essential hypertension BP seems to be stable in facility. Will continue hydralazine and Norvasc.   Type 2 diabetes mellitus  His A1C is 5.9 without any Oral hypoglycemics Accu check once a week.  Hiccup Patient doing well on low dose of Baclofen. Hypokalemia Continue Potassium supplement. Check BMP  Left ICA stenosis. Follow up as out patient. Hyperlipidemia Lipitor recently increased. Check LDL in  Few months.  Anemia  HGB stable right now Will follow with CBC. Depression  Was started on Prozac in hospital. Seems to be  doing well.   Family/ staff Communication: Discussed in detail with Wife and patient. He wants to go home soon but do not think wife can take care of him right now. Will continue to follow the Progress.  Labs/tests ordered: BMP and CBC with Diff Total time spent in this patient care encounter was _45 minutes; greater than 50% of the visit spent counseling patient and coordinating care for problems addressed at this encounter. And in reviewing his records.

## 2016-12-03 ENCOUNTER — Encounter (HOSPITAL_COMMUNITY)
Admission: RE | Admit: 2016-12-03 | Discharge: 2016-12-03 | Disposition: A | Discharge status: Home / Self Care | Payer: Medicare Other | Source: Skilled Nursing Facility | Attending: Internal Medicine | Admitting: Internal Medicine

## 2016-12-03 DIAGNOSIS — E1169 Type 2 diabetes mellitus with other specified complication: Secondary | ICD-10-CM | POA: Insufficient documentation

## 2016-12-03 DIAGNOSIS — I639 Cerebral infarction, unspecified: Secondary | ICD-10-CM | POA: Insufficient documentation

## 2016-12-03 DIAGNOSIS — Z5189 Encounter for other specified aftercare: Secondary | ICD-10-CM | POA: Insufficient documentation

## 2016-12-03 DIAGNOSIS — D649 Anemia, unspecified: Secondary | ICD-10-CM | POA: Insufficient documentation

## 2016-12-03 LAB — CBC WITH DIFFERENTIAL/PLATELET
BASOS ABS: 0.1 10*3/uL (ref 0.0–0.1)
Basophils Relative: 1 %
EOS PCT: 12 %
Eosinophils Absolute: 0.9 10*3/uL — ABNORMAL HIGH (ref 0.0–0.7)
HCT: 36.3 % — ABNORMAL LOW (ref 39.0–52.0)
Hemoglobin: 12.4 g/dL — ABNORMAL LOW (ref 13.0–17.0)
LYMPHS PCT: 12 %
Lymphs Abs: 1 10*3/uL (ref 0.7–4.0)
MCH: 32.7 pg (ref 26.0–34.0)
MCHC: 34.2 g/dL (ref 30.0–36.0)
MCV: 95.8 fL (ref 78.0–100.0)
Monocytes Absolute: 1.1 10*3/uL — ABNORMAL HIGH (ref 0.1–1.0)
Monocytes Relative: 14 %
NEUTROS PCT: 61 %
Neutro Abs: 5 10*3/uL (ref 1.7–7.7)
PLATELETS: 297 10*3/uL (ref 150–400)
RBC: 3.79 MIL/uL — AB (ref 4.22–5.81)
RDW: 12.9 % (ref 11.5–15.5)
WBC: 8 10*3/uL (ref 4.0–10.5)

## 2016-12-03 LAB — BASIC METABOLIC PANEL
ANION GAP: 6 (ref 5–15)
BUN: 16 mg/dL (ref 6–20)
CO2: 23 mmol/L (ref 22–32)
Calcium: 8.8 mg/dL — ABNORMAL LOW (ref 8.9–10.3)
Chloride: 104 mmol/L (ref 101–111)
Creatinine, Ser: 0.87 mg/dL (ref 0.61–1.24)
GFR calc Af Amer: >60 mL/min (ref >60)
GLUCOSE: 124 mg/dL — AB (ref 65–99)
Potassium: 4 mmol/L (ref 3.5–5.1)
SODIUM: 133 mmol/L — AB (ref 135–145)

## 2016-12-09 ENCOUNTER — Encounter (HOSPITAL_COMMUNITY)
Admission: RE | Admit: 2016-12-09 | Discharge: 2016-12-09 | Disposition: A | Discharge status: Home / Self Care | Payer: Medicare Other | Source: Skilled Nursing Facility | Attending: Internal Medicine | Admitting: Internal Medicine

## 2016-12-09 LAB — BASIC METABOLIC PANEL
Anion gap: 7 (ref 5–15)
BUN: 15 mg/dL (ref 6–20)
CALCIUM: 8.9 mg/dL (ref 8.9–10.3)
CO2: 22 mmol/L (ref 22–32)
CREATININE: 1.02 mg/dL (ref 0.61–1.24)
Chloride: 103 mmol/L (ref 101–111)
GFR calc Af Amer: >60 mL/min (ref >60)
Glucose, Bld: 129 mg/dL — ABNORMAL HIGH (ref 65–99)
POTASSIUM: 4.3 mmol/L (ref 3.5–5.1)
SODIUM: 132 mmol/L — AB (ref 135–145)

## 2016-12-18 ENCOUNTER — Encounter (HOSPITAL_COMMUNITY): Payer: Medicare Other

## 2016-12-18 ENCOUNTER — Ambulatory Visit: Payer: Medicare Other | Admitting: Family

## 2016-12-19 ENCOUNTER — Encounter: Payer: Self-pay | Admitting: Physical Medicine & Rehabilitation

## 2016-12-19 ENCOUNTER — Encounter: Payer: Medicare Other | Attending: Physical Medicine & Rehabilitation | Admitting: Physical Medicine & Rehabilitation

## 2016-12-19 VITALS — BP 149/62 | HR 77

## 2016-12-19 DIAGNOSIS — R7303 Prediabetes: Secondary | ICD-10-CM | POA: Diagnosis not present

## 2016-12-19 DIAGNOSIS — R269 Unspecified abnormalities of gait and mobility: Secondary | ICD-10-CM

## 2016-12-19 DIAGNOSIS — Z89511 Acquired absence of right leg below knee: Secondary | ICD-10-CM

## 2016-12-19 DIAGNOSIS — I639 Cerebral infarction, unspecified: Secondary | ICD-10-CM | POA: Diagnosis not present

## 2016-12-19 DIAGNOSIS — I69322 Dysarthria following cerebral infarction: Secondary | ICD-10-CM

## 2016-12-19 DIAGNOSIS — I6529 Occlusion and stenosis of unspecified carotid artery: Secondary | ICD-10-CM

## 2016-12-19 DIAGNOSIS — K5900 Constipation, unspecified: Secondary | ICD-10-CM

## 2016-12-19 DIAGNOSIS — G8320 Monoplegia of upper limb affecting unspecified side: Secondary | ICD-10-CM

## 2016-12-19 DIAGNOSIS — G8102 Flaccid hemiplegia affecting left dominant side: Secondary | ICD-10-CM | POA: Insufficient documentation

## 2016-12-19 MED ORDER — BACLOFEN 20 MG PO TABS: 20.0000 mg | ORAL_TABLET | Freq: Three times a day (TID) | ORAL | 0 refills | Status: DC

## 2016-12-19 NOTE — Progress Notes (Signed)
Subjective:    Patient ID: [name] Cook, male    DOB: 1941-08-20, 76 y.o.   MRN: [id number]  HPI  76 y.o. right handed male with history of CAD status post CABG 1999, hypertension, right BKA presents for hospital follow up after receiving CIR for right basal ganglia infarct.   Admit date: 10/30/2016 Discharge date: 11/21/2016  At discharge, he was unable to be cared for at home, so went to a SNF.  Wife and son present, who provide a significant portion of the history. He is still at the SNF.  They are checking his blood work there. He denies any bleeding.  He has been upgraded to a regular diet, thin liquids.  He does have an appointment to see Neurology. He continues to wear his splints. He has not had a reoccurrence of hiccups. He states his BP has been under good control for the most part.  Overall, pt states he is doing well and appears much better.    Pain Inventory Average Pain 2 Pain Right Now 0 My pain is dull  In the last 24 hours, has pain interfered with the following? General activity 1 Relation with others 0 Enjoyment of life 1 What TIME of day is your pain at its worst? daytime Sleep (in general) Fair  Pain is worse with: sitting Pain improves with: . Relief from Meds: .  Mobility walk with assistance ability to climb steps?  no do you drive?  no needs help with transfers  Function retired I need assistance with the following:  bathing  Neuro/Psych bowel control problems  Prior Studies Any changes since last visit?  no  Physicians involved in your care Any changes since last visit?  no   Family History  Problem Relation Age of Onset  . Hyperlipidemia Mother   . Hypertension Mother   . Other Mother     varicose veins  . Heart disease Mother   . Rheum arthritis Father   . Heart attack Father   . Heart disease Maternal Grandfather   . Diabetes Sister   . Colon cancer Neg Hx    Social History   Social History  . Marital status: Married   Spouse name: N/A  . Number of children: 2  . 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 Smoking status: Former Smoker    Types: Cigarettes    Start date: 12/26/1962    Quit date: 11/25/1997  . Smokeless tobacco: Never Used  . Alcohol use 8.4 oz/week    14 Shots of liquor per week     Comment: daily   . Drug use: No  . Sexual activity: Not Asked   Other Topics Concern  . None   Social History Narrative   Lives in [place] with his wife   Past Surgical History:  Procedure Laterality Date  . APPENDECTOMY  2007  . CORONARY ARTERY BYPASS GRAFT  1999   LIMA to the LAD, saphenous vein graft to obtuse marginal 1 and obtuse marginal 2, spahenous vein graft to intermediate, spahenous vein graft to the right coronary artery in 1999  . LEFT HEART CATHETERIZATION WITH CORONARY ANGIOGRAM N/A 10/13/2014   Procedure: LEFT HEART CATHETERIZATION WITH CORONARY ANGIOGRAM;  Surgeon: Peter M Martinique, MD;  Location: [facility] CATH LAB;  Service: Cardiovascular;  Laterality: N/A;  . LEFT HEART CATHETERIZATION WITH CORONARY/GRAFT ANGIOGRAM N/A 07/19/2013   Procedure: LEFT HEART CATHETERIZATION WITH CORONARY/GRAFT  Cyril Loosen;  Surgeon: Peter M Martinique, MD;  Location: [facility] C[facility] LAB;  Service: Cardiovascular;  Laterality: N/A;  . LEG AMPUTATION  1999   Right BKA  . PERCUTANEOUS CORONARY STENT INTERVENTION (PCI-S)  07/19/2013   Procedure: PERCUTANEOUS CORONARY STENT INTERVENTION (PCI-S);  Surgeon: Peter M Martinique, MD;  Location: [facility] CATH LAB;  Service: Cardiovascular;;   Past Medical History:  Diagnosis Date  . Arthritis   . CAD (coronary artery disease)    2014 75% left main stenosis, LAD 80% followed by a mid subtotal stenosis, circumflex 90% stenosis, right coronary artery occluded, SVG to RCA occluded, SVG ramus occluded, SVG to OM patent without a sequential anastomosis noted. Graft had a focal 99% stenosis which was stented. 3.5 x 16 mm  premier. b. cath 10/13/2014 DES to SVG to OM overlap previous stent  . Cholelithiasis 2014  . Cogan syndrome   . Colon polyps   . Diabetes mellitus without complication ([place])   . Dyslipidemia   . Gallstones   . GERD (gastroesophageal reflux disease)   . Hepatic hemangioma 2014  . Hx of right BKA ([place])    a. 2/2 necrotizing fasciitis.  . Hypertension   . Kidney stones   . Myocardial infarction 1999  . Necrotizing fasciitis ([place])   . Peptic ulcer disease   . Peripheral vascular disease ([facility])    BP (!) 149/62   Pulse 77   SpO2 94%   Opioid Risk Score:   Fall Risk Score:  `1  Depression screen PHQ 2/9  Depression screen [facility] 2/9 09/20/2016 03/21/2016 09/21/2015 11/23/2014  Decreased Interest 0 0 0 0  Down, Depressed, Hopeless 0 0 0 0  PHQ - 2 Score 0 0 0 0   Review of Systems  Constitutional: Negative.   HENT: Negative.   Eyes: Negative.   Respiratory: Negative.   Cardiovascular: Negative.   Gastrointestinal:       Bowel control problems  Endocrine: Negative.   Genitourinary: Negative.   Musculoskeletal: Positive for gait problem.  Skin: Negative.   Allergic/Immunologic: Negative.   Hematological: Negative.   Psychiatric/Behavioral: Negative.   All other systems reviewed and are negative.     Objective:   Physical Exam Constitutional: NAD. Vital signs reviewed.   HENT: Normocephalic and atraumatic.  Eyes: EOM are normal. No discharge.  Cardiovascular: RRR, +murmur. No JVD. Respiratory: Clear. Unlabored.  GI: Soft. Bowel sounds are normal. Musculoskeletal: He exhibits no edema. He exhibits no tenderness.  RLE BKA healed Neurological: He is alert.  Alert and oriented x3. Dysarthria, significantly improved Left facial weakness  Motor: LUE 0/5. mAS 1/4 with elbow flexors, 2/4 wrist flexors, 3/4 finger flexors LLE: HF 2/5, KE 3/5, ADF 2/5, mAS Knee flexors 3/4  RUE: 5/5 proximal to distal RLE: HF 5/5. Skin: Skin is warm and dry and intact.  Psychiatric:  Normal mood and behavior    Assessment & Plan:  76 y.o. right handed male with history of CAD status post CABG 1999, hypertension, right BKA presents for hospital follow up after receiving CIR for right basal ganglia infarct.   1. Left hemiplegia, dysarthria, secondary to right basal ganglia infarct with history of right BKA  Significant improvement in cognitive and also improvement in motor function. Dysphagia resolved.  Currently at SNF  Cont therapies  Cont splinting at night             Has left resting hand splint, PRAFO   Cont Fluoxetine   Follow up Neurology  2. HTN  Cont meds  Goal of BP 130-150, within normal range at office  Follow up with PCP  3. L-CAS  Follow up with Neurology and Cardiology  Will consider surgery referral in future  4. Pre-diabetes  Stable per patient  5. Constipation  Pt states he has been impacted twice, encouraged regular bowel program  6. Left nondominant spastic hemiplegia  Will start Baclofen 10 TID, may increase to 20 TID if tolerating  7. Gait abnormality  Due to #6 and counfounded by right BKA  Meds reviewed Referrals reviewed, ensure Neurology follow up All questions answered  >45 minutes spent with patient and family with >35 minutes spent in counseling regarding stroke, recovery options, treatments for spasticity

## 2016-12-19 NOTE — Progress Notes (Deleted)
HPI The patient presents for followup of known coronary disease. The patient presented to [facility] on 10/12/2014 with chest pain and elevated troponin and ruled in for NSTEMI. He underwent cardiac catheterization on 10/13/2014 which showed EF 45-50%, 80-90% long proximal LAD stenosis, LAD occluded midvessel, 30% ost Ramus, LCX subtotal obcclusion in prox vessel and total occlusion midvessel, 50-60% stenosis in mid RCA with subtotal occlusion in mid to distal vessel. Occluded SVG to RCA, occluded SVG to ramus, patent LIMA to LAD, SVG to OM had 90-95% in-stent restenosis treated with DES. Given the fact he has 2 overlapping drug-eluting stent in SVG to OM, patient will need lifelong dual antiplatelet therapy.  He most recently was in the hospital in December with right sided weakness and was found to have 2 acute infarcts in the right lateral lenticulostriate distribution.  I have reviewed these hospital records for this visit.  CT angiogram head and neck were negative for emergent large vessel occlusion with pseudoaneurysm of the proximal brachiocephalic artery, moderate to severe tandem stenosis of the right MCA mid M1 segment and at the right MCA bifurcation as well as high-grade radiographic string sign stenosis of the left ICA  .Dr. Donnetta Hutching consulted for input and did not recommend L-CEA at this time but suggested that he could have this electively. . Dr. Erlinda Hong recommends 30 day event monitor to work up cardioembolic stroke--to continue DAPT for 3 months followed by plavix alone .  The etiology was not clear but was thought to be most likely related to diffuse intracranial stenosis.  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  Of note the echo showed normal LV function with mild to moderate AI and mild MR.   ***    He has had some chest discomfort and has taken about 5 nitroglycerin since I last saw him. However, this is sporadic and rare and he doesn't  remember the last one. It is not an increasing pattern. It goes away easily after nitroglycerin. He cannot bring this on. It seems to clearly be stable.. There have been no reported palpitations, presyncope or syncope.  He is not exercising as much as I would like.  He does work on his boat.   Allergies  Allergen Reactions  . Altace [Ramipril] Other (See Comments)    hypotension  . Ambien [Zolpidem Tartrate] Other (See Comments)    Sleep walking   Medication Sig Start Date End Date Taking? Authorizing Provider  acetaminophen (TYLENOL) 325 MG tablet Take 650 mg by mouth every 6 (six) hours as needed.    Historical Provider, MD  Amino Acids-Protein Hydrolys (FEEDING SUPPLEMENT, PRO-STAT SUGAR FREE 64,) LIQD Take 30 mLs by mouth 2 (two) times daily. 11/19/16   Ivan Anchors Love, PA-C  amLODipine (NORVASC) 10 MG tablet Take 1 tablet (10 mg total) by mouth daily after supper. 11/21/16   Bary Leriche, PA-C  aspirin 325 MG tablet Take 1 tablet (325 mg total) by mouth daily. 11/19/16   Bary Leriche, PA-C  atorvastatin (LIPITOR) 80 MG tablet Take 1 tablet (80 mg total) by mouth daily at 6 PM. 10/30/16   Geradine Girt, DO  baclofen (LIORESAL) 10 mg/mL SUSP Take 0.25 mLs (2.5 mg total) by mouth 4 (four) times daily as needed (hiccups). 11/19/16   Bary Leriche, PA-C  Calcium Carbonate-Vitamin D (CALCIUM-CARB 600 + D) 600-125 MG-UNIT TABS Take 1 tablet by mouth daily.      Historical Provider, MD  chlorhexidine (PERIDEX) 0.12 % solution 15 mLs by Mouth Rinse route 2 (two) times daily. 11/19/16   Bary Leriche, PA-C  clopidogrel (PLAVIX) 75 MG tablet Take 1 tablet (75 mg total) by mouth daily. 10/31/16   Geradine Girt, DO  finasteride (PROSCAR) 5 MG tablet Take 5 mg by mouth daily.    Historical Provider, MD  FLUoxetine (PROZAC) 20 MG capsule Take 1 capsule (20 mg total) by mouth daily. 11/19/16   Bary Leriche, PA-C  food thickener (THICK IT) POWD prn 10/30/16   Geradine Girt, DO  hydrALAZINE (APRESOLINE) 50  MG tablet Take 1 tablet (50 mg total) by mouth every 8 (eight) hours. 11/20/16   Bary Leriche, PA-C  Multiple Vitamin (MULTIVITAMIN) tablet Take 1 tablet by mouth daily.      Historical Provider, MD  potassium chloride SA (K-DUR,KLOR-CON) 20 MEQ tablet Take 1 tablet (20 mEq total) by mouth 2 (two) times daily. 11/20/16   Bary Leriche, PA-C  Probiotic Product (RESTORA) CAPS Take 1 capsule by mouth daily. 08/17/13   Jerene Bears, MD  ranitidine (ZANTAC) 75 MG tablet Take 75-150 mg by mouth See admin instructions. 75 mg in the am, 150 mg at bedtime    Historical Provider, MD  senna-docusate (SENOKOT-S) 8.6-50 MG tablet Take 2 tablets by mouth 3 (three) times daily. 11/20/16   Bary Leriche, PA-C  tamsulosin (FLOMAX) 0.4 MG CAPS Take 0.8 mg by mouth daily.     Historical Provider, MD    Past Medical History:  Diagnosis Date  . Arthritis   . CAD (coronary artery disease)    2014 75% left main stenosis, LAD 80% followed by a mid subtotal stenosis, circumflex 90% stenosis, right coronary artery occluded, SVG to RCA occluded, SVG ramus occluded, SVG to OM patent without a sequential anastomosis noted. Graft had a focal 99% stenosis which was stented. 3.5 x 16 mm premier. b. cath 10/13/2014 DES to SVG to OM overlap previous stent  . Cholelithiasis 2014  . Cogan syndrome   . Colon polyps   . Diabetes mellitus without complication ([place])   . Dyslipidemia   . Gallstones   . GERD (gastroesophageal reflux disease)   . Hepatic hemangioma 2014  . 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 2/2 necrotizing fasciitis.  . Hypertension   . Kidney stones   . Myocardial infarction 1999  . Necrotizing fasciitis ([place])   . Peptic ulcer disease   . Peripheral vascular disease [facility])     Past Surgical History:  Procedure Laterality Date  . APPENDECTOMY  2007  . CORONARY ARTERY BYPASS GRAFT  1999   LIMA to the LAD, saphenous vein graft to obtuse marginal 1 and obtuse marginal 2, spahenous vein graft to intermediate,  spahenous vein graft to the right coronary artery in 1999  . LEFT HEART CATHETERIZATION WITH CORONARY ANGIOGRAM N/A 10/13/2014   Procedure: LEFT HEART CATHETERIZATION WITH CORONARY ANGIOGRAM;  Surgeon: Peter M Martinique, MD;  Location: [facility] CATH LAB;  Service: Cardiovascular;  Laterality: N/A;  . LEFT HEART CATHETERIZATION WITH CORONARY/GRAFT ANGIOGRAM N/A 07/19/2013   Procedure: LEFT HEART CATHETERIZATION WITH Beatrix Fetters;  Surgeon: Peter M Martinique, MD;  Location: [facility] CATH LAB;  Service: Cardiovascular;  Laterality: N/A;  . LEG AMPUTATION  1999   Right BKA  . PERCUTANEOUS CORONARY STENT INTERVENTION (PCI-S)  07/19/2013   Procedure: PERCUTANEOUS CORONARY STENT INTERVENTION (PCI-S);  Surgeon: Peter M Martinique, MD;  Location: [facility] CATH LAB;  Service: Cardiovascular;;  ROS:  ***. Otherwise as stated in the HPI and negative for all other systems.  PHYSICAL EXAM There were no vitals taken for this visit. GENERAL:  Well appearing NECK:  No jugular venous distention, waveform within normal limits, carotid upstroke brisk and symmetric, soft bilateral carotid bruits bruits, no thyromegaly LUNGS:  Clear to auscultation bilaterally CHEST:  Well healed sternotomy scar.   HEART:  PMI not displaced or sustained,S1 and S2 within normal limits, no S3, no S4, no clicks, no rubs, soft early peaking apical systolic murmur heard also at the right upper sternal border murmurs ABD:  Flat, positive bowel sounds normal in frequency in pitch, no bruits, no rebound, no guarding, no midline pulsatile mass, no hepatomegaly, no splenomegaly EXT:  2 plus pulses throughout, mild left leg edema, no cyanosis no clubbing, status post amputation right leg NEURO:  ***  EKG:  Sinus rhythm, rate *** , interventricular conduction delay, left axis deviation.  12/19/2016   ASSESSMENT AND PLAN  CVA:  He will need an event monitor as embolism with possible arrhythmia is in the differential.  ***  CAD He seems to have a stable  pattern of infrequent angina. At this point no change in therapy is indicated.  He will continue meds as listed.  HTN His blood pressure is not as well controlled with light. I'm going to switch him to chlorthalidone 25 mg daily for better blood pressure control.  Carotid stenosis He will follow with vascular surgery in the future for consideration of treatment of his left carotid artery pending further recovery.  ***  Dyslipidemia He needs to have a fasting lipid profile.     I did an extensive review of hospital records for this visit.

## 2016-12-20 ENCOUNTER — Ambulatory Visit: Payer: Medicare Other | Admitting: Cardiology

## 2016-12-24 ENCOUNTER — Encounter: Payer: Self-pay | Admitting: Vascular Surgery

## 2016-12-30 ENCOUNTER — Other Ambulatory Visit (HOSPITAL_COMMUNITY)
Admission: RE | Admit: 2016-12-30 | Discharge: 2016-12-30 | Disposition: A | Discharge status: Home / Self Care | Payer: No Typology Code available for payment source | Source: Skilled Nursing Facility | Attending: Internal Medicine | Admitting: Internal Medicine

## 2016-12-30 DIAGNOSIS — J09X1 Influenza due to identified novel influenza A virus with pneumonia: Secondary | ICD-10-CM | POA: Insufficient documentation

## 2016-12-30 LAB — INFLUENZA PANEL BY PCR (TYPE A & B)
INFLBPCR: NEGATIVE
Influenza A By PCR: NEGATIVE

## 2016-12-31 ENCOUNTER — Ambulatory Visit (INDEPENDENT_AMBULATORY_CARE_PROVIDER_SITE_OTHER): Payer: Medicare Other | Admitting: Vascular Surgery

## 2016-12-31 ENCOUNTER — Encounter: Payer: Self-pay | Admitting: Vascular Surgery

## 2016-12-31 VITALS — BP 105/56 | HR 78 | Temp 97.0°F | Resp 16 | Ht 68.0 in | Wt 181.0 lb

## 2016-12-31 DIAGNOSIS — I6522 Occlusion and stenosis of left carotid artery: Secondary | ICD-10-CM

## 2016-12-31 NOTE — Progress Notes (Signed)
Vascular and Vein Specialist of [facility]  Patient name: [name] Cook MRN: [id number] DOB: 1941/10/22 Sex: male  REASON FOR VISIT: Follow-up recent admission with major right brain stroke  HPI: [name] Cook is a 76 y.o. male here for follow-up. Recently was admitted to the hospital with a major right brain stroke. 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 There was moderate irregularity and narrowing in his right carotid artery and significant disease in his intracranial carotid arteries. He was also find found to have a asymptomatic severe left internal carotid artery stenosis at the bifurcation which was seen on CT angiogram. He has never had any symptoms related to his left carotid. Has a past history of coronary bypass grafting complicated with necrotizing fasciitis and eventual right below-knee amputation. He is here today with his wife. He is [employer] rehabilitation. Is able to lift his left leg. Still relatively flaccid in his left arm.  Past Medical History:  Diagnosis Date  . Arthritis   . CAD (coronary artery disease)    2014 75% left main stenosis, LAD 80% followed by a mid subtotal stenosis, circumflex 90% stenosis, right coronary artery occluded, SVG to RCA occluded, SVG ramus occluded, SVG to OM patent without a sequential anastomosis noted. Graft had a focal 99% stenosis which was stented. 3.5 x 16 mm premier. b. cath 10/13/2014 DES to SVG to OM overlap previous stent  . Cholelithiasis 2014  . Cogan syndrome   . Colon polyps   . Diabetes mellitus without complication ([place])   . Dyslipidemia   . Gallstones   . GERD (gastroesophageal reflux disease)   . Hepatic hemangioma 2014  . Hx of right BKA ([place])    a. 2/2 necrotizing fasciitis.  . Hypertension   . Kidney stones   . Myocardial infarction 1999  . Necrotizing fasciitis ([place])   . Peptic ulcer disease   . Peripheral vascular disease  ([place])     Family History  Problem Relation Age of Onset  . Hyperlipidemia Mother   . Hypertension Mother   . Other Mother     varicose veins  . Heart disease Mother   . Rheum arthritis Father   . Heart attack Father   . Heart disease Maternal Grandfather   . Diabetes Sister   . Colon cancer Neg Hx     SOCIAL HISTORY: Social History  Substance Use Topics  . Smoking status: Former Smoker    Types: Cigarettes    Start date: 12/26/1962    Quit date: 11/25/1997  . Smokeless tobacco: Never Used  . Alcohol use 8.4 oz/week    14 Shots of liquor per week     Comment: daily     Allergies  Allergen Reactions  . Altace [Ramipril] Other (See Comments)    hypotension  . Ambien [Zolpidem Tartrate] Other (See Comments)    Sleep walking    Current Outpatient Prescriptions  Medication Sig Dispense Refill  . baclofen (LIORESAL) 20 MG tablet Take 1 tablet (20 mg total) by mouth 3 (three) times daily. Please take 10mg  for TID for 7 days and increase to 20 TID if no side effects 90 tablet 0   No current facility-administered medications for this visit.     REVIEW OF SYSTEMS:  [X]  denotes positive finding, [ ]  denotes negative finding Cardiac  Comments:  Chest pain or chest pressure:    Shortness of breath upon exertion:    Short of breath when lying flat:  Irregular heart rhythm:        Vascular    Pain in calf, thigh, or hip brought on by ambulation:    Pain in feet at night that wakes you up from your sleep:     Blood clot in your veins:    Leg swelling:           PHYSICAL EXAM: Vitals:   12/31/16 1111  BP: (!) 105/56  Pulse: 78  Resp: 16  Temp: 97 F (36.1 C)  TempSrc: Oral  SpO2: 95%  Weight: 181 lb (82.1 kg)  Height: 5\' 8"  (1.727 m)    GENERAL: The patient is a well-nourished male, in no acute distress. The vital signs are documented above. CARDIOVASCULAR: Palpable radial pulses bilaterally. Carotid arteries without bruits PULMONARY: There is good air  exchange  MUSCULOSKELETAL: There are no major deformities or cyanosis. NEUROLOGIC: Flaccid in his left arm. Is able to left his left leg against gravity SKIN: There are no ulcers or rashes noted. PSYCHIATRIC: The patient has a normal affect.  DATA:  Discussed his CT scan with him. I does show a high-grade stenosis of his left carotid artery.  MEDICAL ISSUES:  Explained this does put him at approximate 5% per year risk of TIA or stroke related to this. Would not recommend endarterectomy at this time since feel that certainly his rehabilitation is much more important. Plan see him again in 3 months for continued follow-up. We'll repeat his carotid duplex at that time. They note to notify should he develop any left brain events.   Rosetta Posner, MD FACS Vascular and Vein Specialists of [facility] Tel [phone] Pager [phone]

## 2017-01-01 ENCOUNTER — Encounter (HOSPITAL_COMMUNITY): Payer: Medicare Other

## 2017-01-01 ENCOUNTER — Encounter: Payer: Self-pay | Admitting: Cardiovascular Disease

## 2017-01-01 ENCOUNTER — Ambulatory Visit (INDEPENDENT_AMBULATORY_CARE_PROVIDER_SITE_OTHER): Payer: Medicare Other | Admitting: Cardiovascular Disease

## 2017-01-01 ENCOUNTER — Ambulatory Visit: Payer: Medicare Other | Admitting: Family

## 2017-01-01 VITALS — BP 135/66 | HR 86

## 2017-01-01 DIAGNOSIS — I6522 Occlusion and stenosis of left carotid artery: Secondary | ICD-10-CM

## 2017-01-01 DIAGNOSIS — I639 Cerebral infarction, unspecified: Secondary | ICD-10-CM | POA: Diagnosis not present

## 2017-01-01 DIAGNOSIS — I10 Essential (primary) hypertension: Secondary | ICD-10-CM

## 2017-01-01 DIAGNOSIS — E785 Hyperlipidemia, unspecified: Secondary | ICD-10-CM

## 2017-01-01 DIAGNOSIS — I2510 Atherosclerotic heart disease of native coronary artery without angina pectoris: Secondary | ICD-10-CM | POA: Diagnosis not present

## 2017-01-01 NOTE — Assessment & Plan Note (Signed)
History of coronary artery disease status post bypass grafting in 1999. His last catheterization performed 10/13/12 revealed EF of 45-50%, occluded mid LAD with subtotal circumflex and RCA. He had an occluded vein to the RCA, ramus branch and a patent LIMA to the LAD. He had a 9095% in-stent restenosis within the obtuse marginal branch vein graft which was stented with a drug-eluting stent and he has been on dual antiplatelet  therapy. He denies chest pain or shortness of breath.

## 2017-01-01 NOTE — Assessment & Plan Note (Signed)
History of left body, right brain ischemic stroke. He does have a chronically dissected innominate artery without hemodynamic occlusive disease in the right carotid system. He does have a "string sign in his left internal carotid artery. He saw Dr. Donnetta Hutching yesterday felt conservative therapy at least short-term was warranted while he recuperated from his stroke.

## 2017-01-01 NOTE — Progress Notes (Signed)
01/01/2017 [name] Cook   Oct 30, 1941  [id number]  Primary Physician Wardell Honour, MD Primary Cardiologist: Lorretta Harp MD Renae Gloss  HPI:  [name] Cook is a 76 year old married Caucasian male father of 2 children is accompanied by his wife [name] Cook today. He is al long complicated past medical history. He is a patient of Dr.Hochrein's . He has a history of remote coronary artery bypass grafting 1999 along with a right BKA at that time secondary to necrotizing fasciitis. He stopped smoking at that time as well. He has a history of hyperlipidemia and hypertension as well as peripheral vascular disease. His last catheterization performed 10/13/14 the setting of a non-STEMI revealed an EF of 45-50% with occluded native vessels occluded grafts except for LIMA to the LAD and vein graft to obtuse marginal branch which was stented. He was recently admitted with a right brain stroke. He is on have a chronically dissected right innominate artery with high-grade "string sign" in his left internal carotid artery. He has currently wheelchair-bound and is getting rehabilitation therapy. He denies chest pain or shortness of breath.   Current Outpatient Prescriptions  Medication Sig Dispense Refill  . baclofen (LIORESAL) 20 MG tablet Take 1 tablet (20 mg total) by mouth 3 (three) times daily. Please take 10mg  for TID for 7 days and increase to 20 TID if no side effects (Patient taking differently: Take 20 mg by mouth as directed. Please take 10mg  for TID for 7 days and increase to 20 TID if no side effects) 90 tablet 0   No current facility-administered medications for this visit.     Allergies  Allergen Reactions  . Altace [Ramipril] Other (See Comments)    hypotension  . Ambien [Zolpidem Tartrate] Other (See Comments)    Sleep walking    Social History   Social History  . Marital status: Married    Spouse name: N/A  . Number of children: 2  . Years of education: N/A    Occupational History  . 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 Smoking status: Former Smoker    Types: Cigarettes    Start date: 12/26/1962    Quit date: 11/25/1997  . Smokeless tobacco: Never Used  . Alcohol use 8.4 oz/week    14 Shots of liquor per week     Comment: daily   . Drug use: No  . Sexual activity: Not on file   Other Topics Concern  . Not on file   Social History Narrative   Lives in [place] with his wife     Review of Systems: General: negative for chills, fever, night sweats or weight changes.  Cardiovascular: negative for chest pain, dyspnea on exertion, edema, orthopnea, palpitations, paroxysmal nocturnal dyspnea or shortness of breath Dermatological: negative for rash Respiratory: negative for cough or wheezing Urologic: negative for hematuria Abdominal: negative for nausea, vomiting, diarrhea, bright red blood per rectum, melena, or hematemesis Neurologic: negative for visual changes, syncope, or dizziness All other systems reviewed and are otherwise negative except as noted above.    Blood pressure 135/66, pulse 86.  General appearance: alert and no distress Neck: no adenopathy, no JVD, supple, symmetrical, trachea midline, thyroid not enlarged, symmetric, no tenderness/mass/nodules and Bilateral carotid bruits Lungs: clear to auscultation bilaterally Heart: regular rate and rhythm, S1, S2 normal, no murmur, click, rub or gallop Extremities: extremities normal, atraumatic, no cyanosis or edema  EKG not performed today  ASSESSMENT AND PLAN:  Dyslipidemia History of dyslipidemia on statin therapy with recent lipid profile performed 10/28/16 revealing total cholesterol of 251, LDL 180 and HDL L of 43. He may benefit from being on a PCSK9 monoclonal injectable. I will leave this up to Dr. Percival Spanish  .  Coronary atherosclerosis History of coronary artery disease status post bypass grafting in 1999. His last catheterization  performed 10/13/12 revealed EF of 45-50%, occluded mid LAD with subtotal circumflex and RCA. He had an occluded vein to the RCA, ramus branch and a patent LIMA to the LAD. He had a 9095% in-stent restenosis within the obtuse marginal branch vein graft which was stented with a drug-eluting stent and he has been on dual antiplatelet  therapy. He denies chest pain or shortness of breath.  Essential hypertension, benign History of hypertension blood pressure measured at 135/66. He is on amlodipine, and hydralazine. He current meds current dosing  Ischemic stroke ([place]) History of left body, right brain ischemic stroke. He does have a chronically dissected innominate artery without hemodynamic occlusive disease in the right carotid system. He does have a "string sign in his left internal carotid artery. He saw Dr. Donnetta Hutching yesterday felt conservative therapy at least short-term was warranted while he recuperated from his stroke.      Lorretta Harp MD [place], [facility] 01/01/2017 4:02 PM

## 2017-01-01 NOTE — Addendum Note (Signed)
Addended by: Lianne Cure A on: 01/01/2017 12:56 PM   Modules accepted: Orders

## 2017-01-01 NOTE — Patient Instructions (Addendum)
Medication Instructions: Your physician recommends that you continue on your current medications as directed. Please refer to the Current Medication list given to you today.   Follow-Up: Your physician recommends that you schedule a follow-up appointment in: 3 months with Dr. Percival Spanish (in [place]).  If you need a refill on your cardiac medications before your next appointment, please call your pharmacy.

## 2017-01-01 NOTE — Assessment & Plan Note (Signed)
History of dyslipidemia on statin therapy with recent lipid profile performed 10/28/16 revealing total cholesterol of 251, LDL 180 and HDL L of 43. He may benefit from being on a PCSK9 monoclonal injectable. I will leave this up to Dr. Percival Spanish  .

## 2017-01-01 NOTE — Assessment & Plan Note (Signed)
History of hypertension blood pressure measured at 135/66. He is on amlodipine, and hydralazine. He current meds current dosing

## 2017-01-05 ENCOUNTER — Other Ambulatory Visit (HOSPITAL_COMMUNITY)
Admission: AD | Admit: 2017-01-05 | Discharge: 2017-01-05 | Disposition: A | Discharge status: Home / Self Care | Payer: Medicare Other | Source: Skilled Nursing Facility | Attending: Internal Medicine | Admitting: Internal Medicine

## 2017-01-05 DIAGNOSIS — I10 Essential (primary) hypertension: Secondary | ICD-10-CM | POA: Insufficient documentation

## 2017-01-05 LAB — DIFFERENTIAL
BASOS ABS: 0 10*3/uL (ref 0.0–0.1)
Basophils Relative: 0 %
EOS PCT: 0 %
Eosinophils Absolute: 0 10*3/uL (ref 0.0–0.7)
LYMPHS ABS: 1.3 10*3/uL (ref 0.7–4.0)
Lymphocytes Relative: 9 %
MONOS PCT: 10 %
Monocytes Absolute: 1.5 10*3/uL — ABNORMAL HIGH (ref 0.1–1.0)
NEUTROS PCT: 81 %
Neutro Abs: 12.4 10*3/uL — ABNORMAL HIGH (ref 1.7–7.7)

## 2017-01-05 LAB — CBC
HCT: 32 % — ABNORMAL LOW (ref 39.0–52.0)
Hemoglobin: 10.8 g/dL — ABNORMAL LOW (ref 13.0–17.0)
MCH: 31.5 pg (ref 26.0–34.0)
MCHC: 33.8 g/dL (ref 30.0–36.0)
MCV: 93.3 fL (ref 78.0–100.0)
Platelets: 390 10*3/uL (ref 150–400)
RBC: 3.43 MIL/uL — ABNORMAL LOW (ref 4.22–5.81)
RDW: 13.2 % (ref 11.5–15.5)
WBC: 15.4 10*3/uL — ABNORMAL HIGH (ref 4.0–10.5)

## 2017-01-05 LAB — INFLUENZA PANEL BY PCR (TYPE A & B)
Influenza A By PCR: NEGATIVE
Influenza B By PCR: NEGATIVE

## 2017-01-05 LAB — COMPREHENSIVE METABOLIC PANEL WITH GFR
ALT: 22 U/L (ref 17–63)
AST: 49 U/L — ABNORMAL HIGH (ref 15–41)
Albumin: 2.8 g/dL — ABNORMAL LOW (ref 3.5–5.0)
Alkaline Phosphatase: 46 U/L (ref 38–126)
Anion gap: 10 (ref 5–15)
BUN: 23 mg/dL — ABNORMAL HIGH (ref 6–20)
CO2: 24 mmol/L (ref 22–32)
Calcium: 8.9 mg/dL (ref 8.9–10.3)
Chloride: 101 mmol/L (ref 101–111)
Creatinine, Ser: 1.17 mg/dL (ref 0.61–1.24)
GFR calc Af Amer: >60 mL/min
GFR calc non Af Amer: 59 mL/min — ABNORMAL LOW
Glucose, Bld: 171 mg/dL — ABNORMAL HIGH (ref 65–99)
Potassium: 4.4 mmol/L (ref 3.5–5.1)
Sodium: 135 mmol/L (ref 135–145)
Total Bilirubin: 0.6 mg/dL (ref 0.3–1.2)
Total Protein: 6.6 g/dL (ref 6.5–8.1)

## 2017-01-05 LAB — URINALYSIS, ROUTINE W REFLEX MICROSCOPIC
BILIRUBIN URINE: NEGATIVE
Glucose, UA: NEGATIVE mg/dL
KETONES UR: NEGATIVE mg/dL
NITRITE: NEGATIVE
PH: 5 (ref 5.0–8.0)
PROTEIN: 100 mg/dL — AB
Specific Gravity, Urine: 1.019 (ref 1.005–1.030)

## 2017-01-05 LAB — LIPASE, BLOOD: Lipase: 12 U/L (ref 11–51)

## 2017-01-06 ENCOUNTER — Emergency Department (HOSPITAL_COMMUNITY): Payer: Medicare Other

## 2017-01-06 ENCOUNTER — Encounter (HOSPITAL_COMMUNITY): Payer: Self-pay | Admitting: Cardiology

## 2017-01-06 ENCOUNTER — Inpatient Hospital Stay (HOSPITAL_COMMUNITY)
Admission: EM | Admit: 2017-01-06 | Discharge: 2017-01-23 | DRG: 872 | Disposition: E | Payer: Medicare Other | Attending: Internal Medicine | Admitting: Internal Medicine

## 2017-01-06 ENCOUNTER — Encounter: Payer: Self-pay | Admitting: Internal Medicine

## 2017-01-06 DIAGNOSIS — Z955 Presence of coronary angioplasty implant and graft: Secondary | ICD-10-CM

## 2017-01-06 DIAGNOSIS — Z79899 Other long term (current) drug therapy: Secondary | ICD-10-CM

## 2017-01-06 DIAGNOSIS — Z888 Allergy status to other drugs, medicaments and biological substances status: Secondary | ICD-10-CM | POA: Diagnosis not present

## 2017-01-06 DIAGNOSIS — N10 Acute tubulo-interstitial nephritis: Secondary | ICD-10-CM | POA: Diagnosis not present

## 2017-01-06 DIAGNOSIS — Z87891 Personal history of nicotine dependence: Secondary | ICD-10-CM | POA: Diagnosis not present

## 2017-01-06 DIAGNOSIS — E1159 Type 2 diabetes mellitus with other circulatory complications: Secondary | ICD-10-CM | POA: Diagnosis not present

## 2017-01-06 DIAGNOSIS — Z833 Family history of diabetes mellitus: Secondary | ICD-10-CM

## 2017-01-06 DIAGNOSIS — Z89511 Acquired absence of right leg below knee: Secondary | ICD-10-CM

## 2017-01-06 DIAGNOSIS — Z87442 Personal history of urinary calculi: Secondary | ICD-10-CM

## 2017-01-06 DIAGNOSIS — I959 Hypotension, unspecified: Secondary | ICD-10-CM | POA: Diagnosis present

## 2017-01-06 DIAGNOSIS — Z951 Presence of aortocoronary bypass graft: Secondary | ICD-10-CM | POA: Diagnosis not present

## 2017-01-06 DIAGNOSIS — R1011 Right upper quadrant pain: Secondary | ICD-10-CM | POA: Diagnosis not present

## 2017-01-06 DIAGNOSIS — D649 Anemia, unspecified: Secondary | ICD-10-CM | POA: Diagnosis present

## 2017-01-06 DIAGNOSIS — Z8711 Personal history of peptic ulcer disease: Secondary | ICD-10-CM | POA: Diagnosis not present

## 2017-01-06 DIAGNOSIS — I10 Essential (primary) hypertension: Secondary | ICD-10-CM | POA: Diagnosis present

## 2017-01-06 DIAGNOSIS — I63231 Cerebral infarction due to unspecified occlusion or stenosis of right carotid arteries: Secondary | ICD-10-CM | POA: Diagnosis not present

## 2017-01-06 DIAGNOSIS — R509 Fever, unspecified: Secondary | ICD-10-CM | POA: Diagnosis not present

## 2017-01-06 DIAGNOSIS — A419 Sepsis, unspecified organism: Principal | ICD-10-CM | POA: Diagnosis present

## 2017-01-06 DIAGNOSIS — I2510 Atherosclerotic heart disease of native coronary artery without angina pectoris: Secondary | ICD-10-CM | POA: Diagnosis present

## 2017-01-06 DIAGNOSIS — Z66 Do not resuscitate: Secondary | ICD-10-CM | POA: Diagnosis present

## 2017-01-06 DIAGNOSIS — K810 Acute cholecystitis: Secondary | ICD-10-CM | POA: Diagnosis not present

## 2017-01-06 DIAGNOSIS — R109 Unspecified abdominal pain: Secondary | ICD-10-CM | POA: Diagnosis not present

## 2017-01-06 DIAGNOSIS — Z8249 Family history of ischemic heart disease and other diseases of the circulatory system: Secondary | ICD-10-CM

## 2017-01-06 DIAGNOSIS — Z86010 Personal history of colonic polyps: Secondary | ICD-10-CM | POA: Diagnosis not present

## 2017-01-06 DIAGNOSIS — I639 Cerebral infarction, unspecified: Secondary | ICD-10-CM | POA: Diagnosis present

## 2017-01-06 DIAGNOSIS — E785 Hyperlipidemia, unspecified: Secondary | ICD-10-CM | POA: Diagnosis present

## 2017-01-06 DIAGNOSIS — I252 Old myocardial infarction: Secondary | ICD-10-CM

## 2017-01-06 DIAGNOSIS — Z7982 Long term (current) use of aspirin: Secondary | ICD-10-CM

## 2017-01-06 DIAGNOSIS — E1151 Type 2 diabetes mellitus with diabetic peripheral angiopathy without gangrene: Secondary | ICD-10-CM | POA: Diagnosis present

## 2017-01-06 DIAGNOSIS — I469 Cardiac arrest, cause unspecified: Secondary | ICD-10-CM | POA: Diagnosis present

## 2017-01-06 DIAGNOSIS — K8020 Calculus of gallbladder without cholecystitis without obstruction: Secondary | ICD-10-CM | POA: Diagnosis not present

## 2017-01-06 DIAGNOSIS — K219 Gastro-esophageal reflux disease without esophagitis: Secondary | ICD-10-CM | POA: Diagnosis present

## 2017-01-06 DIAGNOSIS — R0989 Other specified symptoms and signs involving the circulatory and respiratory systems: Secondary | ICD-10-CM

## 2017-01-06 DIAGNOSIS — Z4682 Encounter for fitting and adjustment of non-vascular catheter: Secondary | ICD-10-CM | POA: Diagnosis not present

## 2017-01-06 DIAGNOSIS — E440 Moderate protein-calorie malnutrition: Secondary | ICD-10-CM | POA: Diagnosis not present

## 2017-01-06 DIAGNOSIS — Z8261 Family history of arthritis: Secondary | ICD-10-CM

## 2017-01-06 DIAGNOSIS — Z9049 Acquired absence of other specified parts of digestive tract: Secondary | ICD-10-CM

## 2017-01-06 DIAGNOSIS — Z8673 Personal history of transient ischemic attack (TIA), and cerebral infarction without residual deficits: Secondary | ICD-10-CM | POA: Diagnosis not present

## 2017-01-06 DIAGNOSIS — I69354 Hemiplegia and hemiparesis following cerebral infarction affecting left non-dominant side: Secondary | ICD-10-CM | POA: Diagnosis not present

## 2017-01-06 DIAGNOSIS — K8000 Calculus of gallbladder with acute cholecystitis without obstruction: Secondary | ICD-10-CM | POA: Diagnosis not present

## 2017-01-06 DIAGNOSIS — M1990 Unspecified osteoarthritis, unspecified site: Secondary | ICD-10-CM | POA: Diagnosis present

## 2017-01-06 LAB — COMPREHENSIVE METABOLIC PANEL
ALBUMIN: 2.7 g/dL — AB (ref 3.5–5.0)
ALT: 26 U/L (ref 17–63)
AST: 57 U/L — AB (ref 15–41)
Alkaline Phosphatase: 43 U/L (ref 38–126)
Anion gap: 7 (ref 5–15)
BUN: 22 mg/dL — AB (ref 6–20)
CHLORIDE: 100 mmol/L — AB (ref 101–111)
CO2: 23 mmol/L (ref 22–32)
Calcium: 8.3 mg/dL — ABNORMAL LOW (ref 8.9–10.3)
Creatinine, Ser: 0.98 mg/dL (ref 0.61–1.24)
GFR calc Af Amer: >60 mL/min (ref >60)
GFR calc non Af Amer: >60 mL/min (ref >60)
GLUCOSE: 130 mg/dL — AB (ref 65–99)
POTASSIUM: 4.4 mmol/L (ref 3.5–5.1)
Sodium: 130 mmol/L — ABNORMAL LOW (ref 135–145)
Total Bilirubin: 0.6 mg/dL (ref 0.3–1.2)
Total Protein: 6.9 g/dL (ref 6.5–8.1)

## 2017-01-06 LAB — URINALYSIS, ROUTINE W REFLEX MICROSCOPIC
BILIRUBIN URINE: NEGATIVE
Glucose, UA: NEGATIVE mg/dL
Hgb urine dipstick: NEGATIVE
Ketones, ur: NEGATIVE mg/dL
Nitrite: NEGATIVE
Protein, ur: 100 mg/dL — AB
SPECIFIC GRAVITY, URINE: 1.017 (ref 1.005–1.030)
pH: 5 (ref 5.0–8.0)

## 2017-01-06 LAB — CBC
HEMATOCRIT: 29.8 % — AB (ref 39.0–52.0)
Hemoglobin: 10.1 g/dL — ABNORMAL LOW (ref 13.0–17.0)
MCH: 31.6 pg (ref 26.0–34.0)
MCHC: 33.9 g/dL (ref 30.0–36.0)
MCV: 93.1 fL (ref 78.0–100.0)
Platelets: 356 10*3/uL (ref 150–400)
RBC: 3.2 MIL/uL — ABNORMAL LOW (ref 4.22–5.81)
RDW: 13.2 % (ref 11.5–15.5)
WBC: 18.8 10*3/uL — AB (ref 4.0–10.5)

## 2017-01-06 LAB — LIPASE, BLOOD: LIPASE: 18 U/L (ref 11–51)

## 2017-01-06 LAB — I-STAT CG4 LACTIC ACID, ED
LACTIC ACID, VENOUS: 1.3 mmol/L (ref 0.5–1.9)
LACTIC ACID, VENOUS: 1.64 mmol/L (ref 0.5–1.9)

## 2017-01-06 LAB — MRSA PCR SCREENING: MRSA BY PCR: NEGATIVE

## 2017-01-06 MED ORDER — FINASTERIDE 5 MG PO TABS
5.0000 mg | ORAL_TABLET | Freq: Every day | ORAL | Status: DC
Start: 2017-01-07 — End: 2017-01-07
  Filled 2017-01-06 (×2): qty 1

## 2017-01-06 MED ORDER — ACETAMINOPHEN 325 MG PO TABS: 650.0000 mg | ORAL_TABLET | Freq: Four times a day (QID) | ORAL | Status: DC | PRN

## 2017-01-06 MED ORDER — CLOPIDOGREL BISULFATE 75 MG PO TABS
75.0000 mg | ORAL_TABLET | Freq: Every day | ORAL | Status: DC
Start: 2017-01-07 — End: 2017-01-07

## 2017-01-06 MED ORDER — HYDRALAZINE HCL 25 MG PO TABS
50.0000 mg | ORAL_TABLET | Freq: Three times a day (TID) | ORAL | Status: DC
  Filled 2017-01-06: qty 2

## 2017-01-06 MED ORDER — VANCOMYCIN HCL IN DEXTROSE 1-5 GM/200ML-% IV SOLN
1000.0000 mg | Freq: Once | INTRAVENOUS | Status: AC
  Administered 2017-01-07: 1000 mg via INTRAVENOUS
  Filled 2017-01-06: qty 200

## 2017-01-06 MED ORDER — AMLODIPINE BESYLATE 5 MG PO TABS: 10.0000 mg | ORAL_TABLET | Freq: Every day | ORAL | Status: DC

## 2017-01-06 MED ORDER — CHLORHEXIDINE GLUCONATE 0.12 % MT SOLN
15.0000 mL | Freq: Two times a day (BID) | OROMUCOSAL | Status: DC
  Administered 2017-01-07: 15 mL via OROMUCOSAL
  Filled 2017-01-06: qty 15

## 2017-01-06 MED ORDER — TAMSULOSIN HCL 0.4 MG PO CAPS: 0.8000 mg | ORAL_CAPSULE | Freq: Every day | ORAL | Status: DC

## 2017-01-06 MED ORDER — IOPAMIDOL (ISOVUE-300) INJECTION 61%
100.0000 mL | Freq: Once | INTRAVENOUS | Status: AC | PRN
  Administered 2017-01-06: 100 mL via INTRAVENOUS

## 2017-01-06 MED ORDER — INSULIN ASPART 100 UNIT/ML ~~LOC~~ SOLN: 0.0000 [IU] | Freq: Three times a day (TID) | SUBCUTANEOUS | Status: DC

## 2017-01-06 MED ORDER — LACTATED RINGERS IV SOLN
INTRAVENOUS | Status: DC
  Administered 2017-01-07: 01:00:00 via INTRAVENOUS

## 2017-01-06 MED ORDER — ACETAMINOPHEN 500 MG PO TABS
1000.0000 mg | ORAL_TABLET | Freq: Once | ORAL | Status: AC
  Administered 2017-01-06: 1000 mg via ORAL
  Filled 2017-01-06: qty 2

## 2017-01-06 MED ORDER — PIPERACILLIN-TAZOBACTAM 3.375 G IVPB
3.3750 g | Freq: Once | INTRAVENOUS | Status: AC
  Administered 2017-01-07: 3.375 g via INTRAVENOUS
  Filled 2017-01-06: qty 50

## 2017-01-06 MED ORDER — ACETAMINOPHEN 650 MG RE SUPP: 650.0000 mg | Freq: Four times a day (QID) | RECTAL | Status: DC | PRN

## 2017-01-06 MED ORDER — SENNOSIDES-DOCUSATE SODIUM 8.6-50 MG PO TABS
2.0000 | ORAL_TABLET | Freq: Three times a day (TID) | ORAL | Status: DC
  Administered 2017-01-07: 2 via ORAL
  Filled 2017-01-06: qty 2

## 2017-01-06 MED ORDER — FLUOXETINE HCL 20 MG PO CAPS
20.0000 mg | ORAL_CAPSULE | Freq: Every day | ORAL | Status: DC
Start: 2017-01-07 — End: 2017-01-07

## 2017-01-06 MED ORDER — ASPIRIN 325 MG PO TABS
325.0000 mg | ORAL_TABLET | Freq: Every day | ORAL | Status: DC
Start: 2017-01-07 — End: 2017-01-07

## 2017-01-06 MED ORDER — ENOXAPARIN SODIUM 40 MG/0.4ML ~~LOC~~ SOLN: 40.0000 mg | SUBCUTANEOUS | Status: DC

## 2017-01-06 MED ORDER — BACLOFEN 1 MG/ML ORAL SUSPENSION
2.5000 mg | Freq: Four times a day (QID) | ORAL | Status: DC | PRN
  Filled 2017-01-06: qty 0.25

## 2017-01-06 MED ORDER — ONDANSETRON HCL 4 MG PO TABS: 4.0000 mg | ORAL_TABLET | Freq: Four times a day (QID) | ORAL | Status: DC | PRN

## 2017-01-06 MED ORDER — SODIUM CHLORIDE 0.9 % IV BOLUS (SEPSIS)
1000.0000 mL | Freq: Once | INTRAVENOUS | Status: AC
  Administered 2017-01-06: 1000 mL via INTRAVENOUS

## 2017-01-06 MED ORDER — SODIUM CHLORIDE 0.9 % IV SOLN
Freq: Once | INTRAVENOUS | Status: AC
  Administered 2017-01-06: 15:00:00 via INTRAVENOUS

## 2017-01-06 MED ORDER — FAMOTIDINE 20 MG PO TABS
20.0000 mg | ORAL_TABLET | Freq: Two times a day (BID) | ORAL | Status: DC
  Administered 2017-01-07: 20 mg via ORAL
  Filled 2017-01-06: qty 1

## 2017-01-06 MED ORDER — STARCH (THICKENING) PO POWD
ORAL | Status: DC | PRN
  Filled 2017-01-06: qty 227

## 2017-01-06 MED ORDER — FENTANYL CITRATE (PF) 100 MCG/2ML IJ SOLN
50.0000 ug | INTRAMUSCULAR | Status: DC | PRN
  Administered 2017-01-06 – 2017-01-07 (×3): 50 ug via INTRAVENOUS
  Filled 2017-01-06 (×3): qty 2

## 2017-01-06 MED ORDER — ONDANSETRON HCL 4 MG/2ML IJ SOLN: 4.0000 mg | Freq: Four times a day (QID) | INTRAMUSCULAR | Status: DC | PRN

## 2017-01-06 MED ORDER — IOPAMIDOL (ISOVUE-300) INJECTION 61%
INTRAVENOUS | Status: AC
  Filled 2017-01-06: qty 30

## 2017-01-06 MED ORDER — PIPERACILLIN-TAZOBACTAM 3.375 G IVPB 30 MIN
3.3750 g | Freq: Once | INTRAVENOUS | Status: AC
  Administered 2017-01-06: 3.375 g via INTRAVENOUS
  Filled 2017-01-06: qty 50

## 2017-01-06 MED ORDER — SODIUM CHLORIDE 0.9 % IV BOLUS (SEPSIS)
1000.0000 mL | Freq: Once | INTRAVENOUS | Status: DC
Start: 2017-01-06 — End: 2017-01-07

## 2017-01-06 MED ORDER — ATORVASTATIN CALCIUM 40 MG PO TABS: 80.0000 mg | ORAL_TABLET | Freq: Every day | ORAL | Status: DC

## 2017-01-06 MED ORDER — SODIUM CHLORIDE 0.9 % IV BOLUS (SEPSIS)
500.0000 mL | Freq: Once | INTRAVENOUS | Status: AC
  Administered 2017-01-06: 500 mL via INTRAVENOUS

## 2017-01-06 NOTE — Consult Note (Signed)
Reason for Consult: Cholelithiasis, right upper quadrant abdominal pain Referring Physician: Dr. Edilia Bo Cook is an 76 y.o. male.  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 He did describe some upper abdominal pain. This was new to the patient. He does have some mild dysarthria, but was understandable. He does state this is new. He denies any abdominal pain at the present time. He underwent a workup which revealed both cholelithiasis with a normal common bile duct. There was no significant pericholecystic fluid present on ultrasound, but the stone seemed to be in the neck of the gallbladder. He also has a possible urinary tract infection.  Past Medical History:  Diagnosis Date  . Arthritis   . CAD (coronary artery disease)    2014 75% left main stenosis, LAD 80% followed by a mid subtotal stenosis, circumflex 90% stenosis, right coronary artery occluded, SVG to RCA occluded, SVG ramus occluded, SVG to OM patent without a sequential anastomosis noted. Graft had a focal 99% stenosis which was stented. 3.5 x 16 mm premier. b. cath 10/13/2014 DES to SVG to OM overlap previous stent  . Cholelithiasis 2014  . Cogan syndrome   . Colon polyps   . Diabetes mellitus without complication ([place])   . Dyslipidemia   . Gallstones   . GERD (gastroesophageal reflux disease)   . Hepatic hemangioma 2014  . 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  . Hypertension   . Kidney stones   . Myocardial infarction 1999  . Necrotizing fasciitis ([place])   . Peptic ulcer disease   . Peripheral vascular disease [facility])     Past Surgical History:  Procedure Laterality Date  . APPENDECTOMY  2007  . CORONARY ARTERY BYPASS GRAFT  1999   LIMA to the LAD, saphenous vein graft to obtuse  marginal 1 and obtuse marginal 2, spahenous vein graft to intermediate, spahenous vein graft to the right coronary artery in 1999  . LEFT HEART CATHETERIZATION WITH CORONARY ANGIOGRAM N/A 10/13/2014   Procedure: LEFT HEART CATHETERIZATION WITH CORONARY ANGIOGRAM;  Surgeon: [name] M Martinique, MD;  Location: [facility] CATH LAB;  Service: Cardiovascular;  Laterality: N/A;  . LEFT HEART CATHETERIZATION WITH CORONARY/GRAFT ANGIOGRAM N/A 07/19/2013   Procedure: LEFT HEART CATHETERIZATION WITH Beatrix Fetters;  Surgeon: [name] M Martinique, MD;  Location: [facility] CATH LAB;  Service: Cardiovascular;  Laterality: N/A;  . LEG AMPUTATION  1999   Right BKA  . LITHOTRIPSY    . PERCUTANEOUS CORONARY STENT INTERVENTION (PCI-S)  07/19/2013   Procedure: PERCUTANEOUS CORONARY STENT INTERVENTION (PCI-S);  Surgeon: [name] M Martinique, MD;  Location: [facility] CATH LAB;  Service: Cardiovascular;;    Family History  Problem Relation Age of Onset  . Hyperlipidemia Mother   . Hypertension Mother   . Other Mother     varicose veins  . Heart disease Mother   . Rheum arthritis Father   . Heart attack Father   . Heart disease Maternal Grandfather   . Diabetes Sister   . Colon cancer Neg Hx     Social History:  reports that he quit smoking about 19 years ago. His smoking use included Cigarettes. He started smoking about 54 years ago. He has never used smokeless tobacco. He reports that he drinks about 8.4 oz of alcohol per  week . He reports that he does not use drugs.  Allergies:  Allergies  Allergen Reactions  . Altace [Ramipril] Other (See Comments)    hypotension  . Ambien [Zolpidem Tartrate] Other (See Comments)    Sleep walking    Medications: Prior to Admission:  (Not in a hospital admission) Scheduled:   Results for orders placed or performed during the hospital encounter of 12/29/2016 (from the past 48 hour(s))  Urinalysis, Routine w reflex microscopic     Status: Abnormal   Collection Time: 01/12/2017 10:58 AM  Result  Value Ref Range   Color, Urine YELLOW YELLOW   APPearance CLOUDY (A) CLEAR   Specific Gravity, Urine 1.017 1.005 - 1.030   pH 5.0 5.0 - 8.0   Glucose, UA NEGATIVE NEGATIVE mg/dL   Hgb urine dipstick NEGATIVE NEGATIVE   Bilirubin Urine NEGATIVE NEGATIVE   Ketones, ur NEGATIVE NEGATIVE mg/dL   Protein, ur 100 (A) NEGATIVE mg/dL   Nitrite NEGATIVE NEGATIVE   Leukocytes, UA MODERATE (A) NEGATIVE   RBC / HPF 0-5 0 - 5 RBC/hpf   WBC, UA TOO NUMEROUS TO COUNT 0 - 5 WBC/hpf   Bacteria, UA RARE (A) NONE SEEN   WBC Clumps PRESENT    Mucous PRESENT   Lipase, blood     Status: None   Collection Time: 01/19/2017 11:16 AM  Result Value Ref Range   Lipase 18 11 - 51 U/L  Comprehensive metabolic panel     Status: Abnormal   Collection Time: 01/19/2017 11:16 AM  Result Value Ref Range   Sodium 130 (L) 135 - 145 mmol/L   Potassium 4.4 3.5 - 5.1 mmol/L   Chloride 100 (L) 101 - 111 mmol/L   CO2 23 22 - 32 mmol/L   Glucose, Bld 130 (H) 65 - 99 mg/dL   BUN 22 (H) 6 - 20 mg/dL   Creatinine, Ser 0.98 0.61 - 1.24 mg/dL   Calcium 8.3 (L) 8.9 - 10.3 mg/dL   Total Protein 6.9 6.5 - 8.1 g/dL   Albumin 2.7 (L) 3.5 - 5.0 g/dL   AST 57 (H) 15 - 41 U/L   ALT 26 17 - 63 U/L   Alkaline Phosphatase 43 38 - 126 U/L   Total Bilirubin 0.6 0.3 - 1.2 mg/dL   GFR calc non Af Amer >60 >60 mL/min   GFR calc Af Amer >60 >60 mL/min    Comment: (NOTE) The eGFR has been calculated using the CKD EPI equation. This calculation has not been validated in all clinical situations. eGFR's persistently <60 mL/min signify possible Chronic Kidney Disease.    Anion gap 7 5 - 15  CBC     Status: Abnormal   Collection Time: 01/11/2017 11:16 AM  Result Value Ref Range   WBC 18.8 (H) 4.0 - 10.5 K/uL   RBC 3.20 (L) 4.22 - 5.81 MIL/uL   Hemoglobin 10.1 (L) 13.0 - 17.0 g/dL   HCT 29.8 (L) 39.0 - 52.0 %   MCV 93.1 78.0 - 100.0 fL   MCH 31.6 26.0 - 34.0 pg   MCHC 33.9 30.0 - 36.0 g/dL   RDW 13.2 11.5 - 15.5 %   Platelets 356 150 -  400 K/uL  I-Stat CG4 Lactic Acid, ED     Status: None   Collection Time: 12/29/2016 12:16 PM  Result Value Ref Range   Lactic Acid, Venous 1.30 0.5 - 1.9 mmol/L  I-Stat CG4 Lactic Acid, ED     Status: None   Collection Time: 01/15/2017  3:31 PM  Result Value Ref Range   Lactic Acid, Venous 1.64 0.5 - 1.9 mmol/L    Dg Chest 2 View  Result Date: 01/04/2017 CLINICAL DATA:  Nausea, vomiting, fever, history of stroke EXAM: CHEST  2 VIEW COMPARISON:  10/28/2016 FINDINGS: Cardiomediastinal silhouette is stable. Status post median sternotomy. No pulmonary edema. Elevation of the right hemidiaphragm again noted. Streaky left base retrocardiac atelectasis or early infiltrate. Mild degenerative changes thoracic spine. IMPRESSION: No pulmonary edema. Streaky left base retrocardiac atelectasis or early infiltrate. Stable chronic elevation of the right hemidiaphragm. Electronically Signed   By: Natasha Mead M.D.   On: 01/20/2017 11:55   Ct Abdomen Pelvis W Contrast  Result Date: 01/15/2017 CLINICAL DATA:  Right-sided abdominal pain for 2 days. Worse with deep breaths. Fever. EXAM: CT ABDOMEN AND PELVIS WITH CONTRAST TECHNIQUE: Multidetector CT imaging of the abdomen and pelvis was performed using the standard protocol following bolus administration of intravenous contrast. CONTRAST:  ISOVUE-300 IOPAMIDOL (ISOVUE-300) INJECTION 61% COMPARISON:  None. FINDINGS: Lower chest: Trace bilateral pleural effusions. Bilateral lower lobe airspace disease likely reflecting atelectasis. Hepatobiliary: 18 mm hypodense, fluid attenuating right hepatic mass inferiorly most consistent with a cyst. 16 mm hypodense left hepatic mass unchanged compared with 02/09/2013 which is technically indeterminate etiology, but the stability is suggestive of benignity. Cholelithiasis with gallbladder distension, gallbladder wall thickening and mild pericholecystic inflammatory haziness most consistent with acute cholecystitis. No intrahepatic  or extrahepatic biliary ductal dilatation. Pancreas: Unremarkable. No pancreatic ductal dilatation or surrounding inflammatory changes. Spleen: Normal in size without focal abnormality. Adrenals/Urinary Tract: Normal adrenal glands. Bilateral nonobstructing renal calculi. Bilateral renal calculi in the renal pelvis. No obstructive uropathy. Bladder wall thickening which may be secondary to chronic bladder outlet obstruction versus cystitis. 3.3 cm right bladder diverticulum. Stomach/Bowel: No bowel wall thickening or bowel dilatation. No pneumatosis, pneumoperitoneum or portal venous gas. Vascular/Lymphatic: Normal caliber abdominal aorta with atherosclerosis. No lymphadenopathy. Reproductive: Prostate is unremarkable. Other: No abdominal wall hernia or abnormality. No abdominopelvic ascites. Musculoskeletal: No acute osseous abnormality. No lytic or sclerotic osseous lesion. Degenerative disc disease with disc height loss at L3-4 with bilateral facet arthropathy. Severe bilateral facet arthropathy at L4-5 and L5-S1. IMPRESSION: 1. Cholelithiasis with findings concerning for acute cholecystitis. 2. Bilateral nonobstructing renal calculi. 3. Bladder wall thickening likely secondary to bladder outlet obstruction versus cystitis. 4. Bilateral trace pleural effusions with bibasilar atelectasis. Electronically Signed   By: Elige Ko   On: 01/08/2017 14:29   US Abdomen Limited  Result Date: 01/06/2017 CLINICAL DATA:  Three days of right upper quadrant pain. Gallstones demonstrated on CT scan today. EXAM: US ABDOMEN LIMITED - RIGHT UPPER QUADRANT COMPARISON:  CT scan of the abdomen and pelvis of today's date FINDINGS: Gallbladder: There is a large gallstone in the gallbladder neck measuring 1.9 cm. The gallbladder wall measures 11 mm in thickness. There is no pericholecystic fluid. There is no positive sonographic Murphy's sign. Common bile duct: Diameter: 3.7 mm Liver: The hepatic echotexture is mildly increased  diffusely. There is an area of decreased echogenicity in the inferior aspect of the right lobe in the lateral subcapsular region which corresponds to a cystic region on today's CT scan. It measures 2.4 x 1.5 x 2.3 cm. It exhibits a small amount of enhanced through transmission. IMPRESSION: Large gallstone in the gallbladder neck. Moderate gallbladder wall thickening without positive sonographic Murphy's sign. No pericholecystic fluid. This may reflect subacute or chronic cholecystitis. Increased hepatic echotexture compatible with fatty infiltration. Cyst in the  inferior aspect of the right lobe. Electronically Signed   By: David  Cook M.D.   On: 12/29/2016 16:32    ROS:  Pertinent items are noted in HPI.  Blood pressure (!) 101/38, pulse 91, temperature 101.3 F (38.5 C), temperature source Rectal, resp. rate 18, SpO2 92 %. Physical Exam: Pleasant white male in no acute distress. Lungs clear to auscultation with breath sounds bilaterally. Eye examination reveals no scleral icterus. Heart examination reveals a regular rate and rhythm without S3, S4, murmurs. The abdomen is soft with mild discomfort to deep palpation to palpation. No hepatosplenomegaly, masses, hernias identified. Back examination did reveal right-sided CVA tenderness. Ultrasound, lab reports reviewed  Assessment/Plan: Impression: Upper abdominal and back pain with leukocytosis, cholelithiasis. It is difficult to distinguish whether this is cholelithiasis or right-sided pyelonephritis. His liver tests are for the most part unremarkable. Complicating factors is his new stroke and he is on dual antiplatelet therapy including Plavix. He would be a high risk candidate to undergo general anesthesia due to these factors. At this point, I would start him on appropriate antibiotics. Should his condition worsen, would consider a cholecystostomy tube percutaneously to relieve any infection. I did discuss this with the patient in wife, who  agreed. Will follow with you.  [name] Cook A 01/13/2017, 5:11 PM

## 2017-01-06 NOTE — ED Provider Notes (Signed)
[facility] DEPT Provider Note   CSN: [id number] Arrival date & time: 01/03/2017  1043   By signing my name below, I, Hilbert Odor, attest that this documentation has been prepared under the direction and in the presence of Elnora Morrison, MD. Electronically Signed: Hilbert Odor, Scribe. 01/03/2017. 1:04 PM. History   Chief Complaint Chief Complaint  Patient presents with  . Abdominal Pain     The history is provided by the patient. No language interpreter was used.  Abdominal Pain   This is a new problem. The current episode started 2 days ago. Associated symptoms include fever. The symptoms are aggravated by deep breathing and activity.  HPI Comments: [name] Cook is a 76 y.o. male who presents to the Emergency Department complaining of right sided abdominal pain for the past couple of days. Patient states that the pain worsens upon exertion and deep breaths. The patient reports cough and fever. He denies any urinary symptoms and chills. The patient states that he has a hx of nephrolithiasis but states that this pain feels different. The patient has had an appendectomy in the past. The patient still has his gall bladder. He reports no alleviating factors.  Past Medical History:  Diagnosis Date  . Arthritis   . CAD (coronary artery disease)    2014 75% left main stenosis, LAD 80% followed by a mid subtotal stenosis, circumflex 90% stenosis, right coronary artery occluded, SVG to RCA occluded, SVG ramus occluded, SVG to OM patent without a sequential anastomosis noted. Graft had a focal 99% stenosis which was stented. 3.5 x 16 mm premier. b. cath 10/13/2014 DES to SVG to OM overlap previous stent  . Cholelithiasis 2014  . Cogan syndrome   . Colon polyps   . Diabetes mellitus without complication ([place])   . Dyslipidemia   . Gallstones   . GERD (gastroesophageal reflux disease)   . Hepatic hemangioma 2014  . 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  .  Hypertension   . Kidney stones   . Myocardial infarction 1999  . Necrotizing fasciitis ([place])   . Peptic ulcer disease   . Peripheral vascular disease [facility])     Patient Active Problem List   Diagnosis Date Noted  . 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 Acute blood loss anemia   . 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 Dysarthria, post-stroke   . Slow transit constipation   . Hiccup   . Dysphagia   . Hyponatremia   . AKI (acute kidney injury) ([place])   . 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 Occlusion and stenosis of carotid artery   . Right basal ganglia embolic stroke ([place]) [id number]  . Cerebral infarction due to stenosis of right carotid artery ([place])   . Gastroesophageal reflux disease without esophagitis   . S/P BKA (below knee amputation) unilateral, right ([place])   . Cerebral infarction ([place])   . Hypertensive crisis   . Acute lower UTI   . Stenosis of carotid artery   . Prediabetes   . Bilateral carotid artery stenosis   . Ischemic stroke ([place]) 10/27/2016  . NSTEMI (non-ST elevated myocardial infarction) ([place]) 10/12/2014  . Hx of right BKA ([place]) 10/12/2014  . 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 Atherosclerosis of native arteries of extremity with intermittent claudication ([place]) 12/08/2013  . Diabetes mellitus with circulatory  complication ([place])   . Essential hypertension, benign 07/17/2013  . Hypokalemia 07/17/2013  . Non-ST elevation myocardial infarction (NSTEMI), initial episode of care ([place]) 07/16/2013  . Peripheral vascular disease ([place]) 03/03/2013  . CAROTID BRUIT 02/06/2011  . Dyslipidemia 10/31/2009  . Coronary atherosclerosis 10/31/2009    Past Surgical History:  Procedure Laterality Date  . APPENDECTOMY  2007  . CORONARY ARTERY BYPASS GRAFT  1999   LIMA to the LAD, saphenous vein graft to obtuse marginal 1 and  obtuse marginal 2, spahenous vein graft to intermediate, spahenous vein graft to the right coronary artery in 1999  . LEFT HEART CATHETERIZATION WITH CORONARY ANGIOGRAM N/A 10/13/2014   Procedure: LEFT HEART CATHETERIZATION WITH CORONARY ANGIOGRAM;  Surgeon: Peter M Martinique, MD;  Location[facility] CATH LAB;  Service: Cardiovascular;  Laterality: N/A;  . LEFT HEART CATHETERIZATION WITH CORONARY/GRAFT ANGIOGRAM N/A 07/19/2013   Procedure: LEFT HEART CATHETERIZATION WITH Beatrix Fetters;  Surgeon: Peter M Martinique, MD;  Location: [facility] CATH LAB;  Service: Cardiovascular;  Laterality: N/A;  . LEG AMPUTATION  1999   Right BKA  . PERCUTANEOUS CORONARY STENT INTERVENTION (PCI-S)  07/19/2013   Procedure: PERCUTANEOUS CORONARY STENT INTERVENTION (PCI-S);  Surgeon: Peter M Martinique, MD;  Location: [facility] CATH LAB;  Service: Cardiovascular;;       Home Medications    Prior to Admission medications   Medication Sig Start Date End Date Taking? Authorizing Provider  acetaminophen (TYLENOL) 325 MG tablet Take 650 mg by mouth every 6 (six) hours as needed.   Yes Historical Provider, MD  amLODipine (NORVASC) 10 MG tablet Take 1 tablet (10 mg total) by mouth daily after supper. 11/21/16  Yes Ivan Anchors Love, PA-C  aspirin 325 MG tablet Take 1 tablet (325 mg total) by mouth daily. 11/19/16  Yes Ivan Anchors Love, PA-C  atorvastatin (LIPITOR) 80 MG tablet Take 1 tablet (80 mg total) by mouth daily at 6 PM. 10/30/16  Yes Geradine Girt, DO  baclofen (LIORESAL) 10 mg/mL SUSP Take 2.5 mg by mouth 4 (four) times daily as needed (hiccups).    Yes Historical Provider, MD  Calcium Carbonate-Vitamin D (CALCIUM-CARB 600 + D) 600-125 MG-UNIT TABS Take 1 tablet by mouth daily.     Yes Historical Provider, MD  cefTRIAXone (ROCEPHIN) 1 g injection Inject 1 g into the muscle daily. Give for 5 days   Yes Historical Provider, MD  chlorhexidine (PERIDEX) 0.12 % solution 15 mLs by Mouth Rinse route 2 (two) times daily. 11/19/16  Yes Ivan Anchors  Love, PA-C  clopidogrel (PLAVIX) 75 MG tablet Take 1 tablet (75 mg total) by mouth daily. 10/31/16  Yes Geradine Girt, DO  finasteride (PROSCAR) 5 MG tablet Take 5 mg by mouth daily.   Yes Historical Provider, MD  food thickener (THICK IT) POWD prn 10/30/16  Yes Geradine Girt, DO  hydrALAZINE (APRESOLINE) 50 MG tablet Take 1 tablet (50 mg total) by mouth every 8 (eight) hours. 11/20/16  Yes Ivan Anchors Love, PA-C  Lactobacillus (PROBIOTIC ACIDOPHILUS PO) Give 1 tablet by mouth daily twice a day for 7 days   Yes Historical Provider, MD  Multiple Vitamin (MULTIVITAMIN) tablet Take 1 tablet by mouth daily.     Yes Historical Provider, MD  potassium chloride SA (K-DUR,KLOR-CON) 20 MEQ tablet Take 1 tablet (20 mEq total) by mouth 2 (two) times daily. 11/20/16  Yes Ivan Anchors Love, PA-C  Probiotic Product (RISA-BID PROBIOTIC PO) Give 1 tablet by mouth once a day   Yes Historical Provider, MD  promethazine (PHENERGAN) 25 MG suppository Place 25 mg rectally every 6 (six) hours as needed for nausea or vomiting.   Yes Historical Provider, MD  ranitidine (ZANTAC) 75 MG tablet Take 75-150 mg by mouth See admin instructions. 75 mg in the am, 150 mg at bedtime   Yes Historical Provider, MD  senna-docusate (SENOKOT-S) 8.6-50 MG tablet Take 2 tablets by mouth 3 (three) times daily. 11/20/16  Yes Ivan Anchors Love, PA-C  tamsulosin (FLOMAX) 0.4 MG CAPS Take 0.8 mg by mouth daily.    Yes Historical Provider, MD  FLUoxetine (PROZAC) 20 MG capsule Take 1 capsule (20 mg total) by mouth daily. Patient not taking: Reported on 01/11/2017 11/19/16   Bary Leriche, PA-C    Family History Family History  Problem Relation Age of Onset  . Hyperlipidemia Mother   . Hypertension Mother   . Other Mother     varicose veins  . Heart disease Mother   . Rheum arthritis Father   . Heart attack Father   . Heart disease Maternal Grandfather   . Diabetes Sister   . Colon cancer Neg Hx     Social History Social History  Substance  Use Topics  . Smoking status: Former Smoker    Types: Cigarettes    Start date: 12/26/1962    Quit date: 11/25/1997  . Smokeless tobacco: Never Used  . Alcohol use 8.4 oz/week    14 Shots of liquor per week     Comment: daily      Allergies   Altace [ramipril] and Ambien [zolpidem tartrate]   Review of Systems Review of Systems  Constitutional: Positive for fever.  Respiratory: Positive for cough.   Gastrointestinal: Positive for abdominal pain (Right sided).  All other systems reviewed and are negative.    Physical Exam Updated Vital Signs BP 114/60   Pulse 87   Temp 101.2 F (38.4 C) (Rectal)   Resp 18   SpO2 94%   Physical Exam  Constitutional: He is oriented to person, place, and time. He appears well-developed and well-nourished.  HENT:  Head: Normocephalic and atraumatic.  Eyes: Pupils are equal, round, and reactive to light.  Neck: Normal range of motion. Neck supple.  Cardiovascular: Normal rate.   Pulmonary/Chest: Effort normal.  Abdominal: There is tenderness (RUQ pain).  Musculoskeletal: He exhibits no edema.  Neurological: He is alert and oriented to person, place, and time.  Skin: Skin is warm and dry.  Psychiatric: He has a normal mood and affect.  Nursing note and vitals reviewed.    ED Treatments / Results  DIAGNOSTIC STUDIES: Oxygen Saturation is 93% on RA, normal by my interpretation.    COORDINATION OF CARE: 12:01 PM Discussed treatment plan with pt at bedside and pt agreed to plan. I will check the patient's CT a/p.  Labs (all labs ordered are listed, but only abnormal results are displayed) Labs Reviewed  COMPREHENSIVE METABOLIC PANEL - Abnormal; Notable for the following:       Result Value   Sodium 130 (*)    Chloride 100 (*)    Glucose, Bld 130 (*)    BUN 22 (*)    Calcium 8.3 (*)    Albumin 2.7 (*)    AST 57 (*)    All other components within normal limits  CBC - Abnormal; Notable for the following:    WBC 18.8 (*)    RBC  3.20 (*)    Hemoglobin 10.1 (*)    HCT 29.8 (*)  All other components within normal limits  URINE CULTURE  LIPASE, BLOOD  URINALYSIS, ROUTINE W REFLEX MICROSCOPIC  I-STAT CG4 LACTIC ACID, ED    EKG  EKG Interpretation None       Radiology Dg Chest 2 View  Result Date: 01/08/2017 CLINICAL DATA:  Nausea, vomiting, fever, history of stroke EXAM: CHEST  2 VIEW COMPARISON:  10/28/2016 FINDINGS: Cardiomediastinal silhouette is stable. Status post median sternotomy. No pulmonary edema. Elevation of the right hemidiaphragm again noted. Streaky left base retrocardiac atelectasis or early infiltrate. Mild degenerative changes thoracic spine. IMPRESSION: No pulmonary edema. Streaky left base retrocardiac atelectasis or early infiltrate. Stable chronic elevation of the right hemidiaphragm. Electronically Signed   By: Lahoma Crocker M.D.   On: 01/13/2017 11:55    Procedures Procedures (including critical care time)  Medications Ordered in ED Medications  fentaNYL (SUBLIMAZE) injection 50 mcg (50 mcg Intravenous Given 12/28/2016 1227)  iopamidol (ISOVUE-300) 61 % injection (not administered)  sodium chloride 0.9 % bolus 500 mL (500 mLs Intravenous New Bag/Given 12/29/2016 1227)     Initial Impression / Assessment and Plan / ED Course  I have reviewed the triage vital signs and the nursing notes.  Pertinent labs & imaging results that were available during my care of the patient were reviewed by me and considered in my medical decision making (see chart for details).   patient presents with right-sided abdominal pain. Discuss concern for kidney infection/stone versus gallbladder. Plan for CT scan for further details, sepsis screen and IV fluids.    CT scan concerning for acute cholecystitis, discussed the case with general surgery on-call who will consult. Paged triad hospitalist for admission with patient's complex medical history and also concern for pyelonephritis. Urinalysis sent.  The  patients results and plan were reviewed and discussed.   Any x-rays performed were independently reviewed by myself.   Differential diagnosis were considered with the presenting HPI.  Medications  fentaNYL (SUBLIMAZE) injection 50 mcg (50 mcg Intravenous Given 01/14/2017 1227)  iopamidol (ISOVUE-300) 61 % injection (not administered)  piperacillin-tazobactam (ZOSYN) IVPB 3.375 g (3.375 g Intravenous New Bag/Given 01/02/2017 1530)  sodium chloride 0.9 % bolus 500 mL (0 mLs Intravenous Stopped 01/15/2017 1350)  iopamidol (ISOVUE-300) 61 % injection 100 mL (100 mLs Intravenous Contrast Given 01/19/2017 1349)  0.9 %  sodium chloride infusion ( Intravenous New Bag/Given 12/28/2016 1527)  acetaminophen (TYLENOL) tablet 1,000 mg (1,000 mg Oral Given 01/02/2017 1531)    Vitals:   12/29/2016 1300 01/20/2017 1330 01/17/2017 1500 01/19/2017 1518  BP: 109/60 108/55 114/56   Pulse: 87 86 98   Resp:      Temp:    101.3 F (38.5 C)  TempSrc:    Rectal  SpO2: 92% 95% 90%     Final diagnoses:  Right sided abdominal pain  Acute cholecystitis  Acute pyelonephritis    Admission/ observation were discussed with the admitting physician, patient and/or family and they are comfortable with the plan.     Final Clinical Impressions(s) / ED Diagnoses   Final diagnoses:  Right sided abdominal pain    New Prescriptions New Prescriptions   No medications on file     Elnora Morrison, MD 12/26/2016 1538

## 2017-01-06 NOTE — H&P (Signed)
History and Physical    [name] Cook FGH:[id number] DOB: [birth year] DOA: 01/18/2017  PCP: [name] Honour, MD Consultants:  [facility] - cardiology; Early - vascular; Karsten Ro - urology Patient coming from: home - lives with wife; NOK: wife, [phone]  Chief Complaint: abdominal pain  HPI: [name] Cook is a 76 y.o. male with medical history significant of PVD with necrotizing fasciitis s/p R BKA; CAD; HTN; HLD; DM; and cholelithiasis in 2014 as well as a CVA in December, admitted from 12/3-after Christmas.  Was transferred to [facility] for rehab.  Still there and getting rehab.  Over the last few days, he has had some problems with right-sided abdominal pain.  Vomiting started Saturday night.  Sunday, had 1 additional episode of emesis.  Did appear to have nausea with pills today but no further emesis today.  Fever Saturday and Sunday, subjective, 101 today.  No diarrhea this week, but he was impacted several weeks ago and this was treated and led to some diarrhea.    Per facility: confused as normal with abdominal pain.  88% on RA.  T 101.9.  N/V over weekend.  WBC 15.  1L IVF and Rocephin 1 gram x 2 doses.    H/o CABG in 1999, developed nec fasc in harvested vein area, so they ended up amputating.  S/p BKA.  Wears prosthesis.    In the ER, she had an abnormal CT and so surgery was consulted.  Also with ? Of pyelonephritis.  Given Zosyn.  Review of Systems: As per HPI; otherwise 10 point review of systems reviewed and negative.   Ambulatory Status:  Currently requiring 2 person assist s/p CVA  Past Medical History:  Diagnosis Date  . Arthritis   . CAD (coronary artery disease)    2014 75% left main stenosis, LAD 80% followed by a mid subtotal stenosis, circumflex 90% stenosis, right coronary artery occluded, SVG to RCA occluded, SVG ramus occluded, SVG to OM patent without a sequential anastomosis noted. Graft had a focal 99% stenosis which was stented. 3.5 x 16 mm premier. b.  cath 10/13/2014 DES to SVG to OM overlap previous stent  . Cholelithiasis 2014  . Cogan syndrome   . Colon polyps   . Diabetes mellitus without complication ([place])   . Dyslipidemia   . Gallstones   . GERD (gastroesophageal reflux disease)   . Hepatic hemangioma 2014  . Hx of right BKA ([place])    a. 2/2 necrotizing fasciitis.  . Hypertension   . Kidney stones   . Myocardial infarction 1999  . Necrotizing fasciitis ([place])   . Peptic ulcer disease   . Peripheral vascular disease [facility])     Past Surgical History:  Procedure Laterality Date  . APPENDECTOMY  2007  . CORONARY ARTERY BYPASS GRAFT  1999   LIMA to the LAD, saphenous vein graft to obtuse marginal 1 and obtuse marginal 2, spahenous vein graft to intermediate, spahenous vein graft to the right coronary artery in 1999  . LEFT HEART CATHETERIZATION WITH CORONARY ANGIOGRAM N/A 10/13/2014   Procedure: LEFT HEART CATHETERIZATION WITH CORONARY ANGIOGRAM;  Surgeon: Peter M Martinique, MD;  Location: [facility] CATH LAB;  Service: Cardiovascular;  Laterality: N/A;  . LEFT HEART CATHETERIZATION WITH CORONARY/GRAFT ANGIOGRAM N/A 07/19/2013   Procedure: LEFT HEART CATHETERIZATION WITH Beatrix Fetters;  Surgeon: Peter M Martinique, MD;  Location: [facility] CATH LAB;  Service: Cardiovascular;  Laterality: N/A;  . LEG AMPUTATION  1999   Right BKA  . LITHOTRIPSY    . PERCUTANEOUS  CORONARY STENT INTERVENTION (PCI-S)  07/19/2013   Procedure: PERCUTANEOUS CORONARY STENT INTERVENTION (PCI-S);  Surgeon: Peter M Martinique, MD;  Location: [facility] CATH LAB;  Service: Cardiovascular;;    Social History   Social History  . Marital status: Married    Spouse name: N/A  . Number of children: 2  . Years of education: N/A   Occupational History  . retired    Social History Main Topics  . Smoking status: Former Smoker    Types: Cigarettes    Start date: 12/26/1962    Quit date: 11/25/1997  . Smokeless tobacco: Never Used  . Alcohol use 8.4 oz/week    14 Shots of liquor per  week     Comment: daily - none since CVA  . Drug use: No  . Sexual activity: Not on file   Other Topics Concern  . Not on file   Social History Narrative   Lives in [place] with his wife    Allergies  Allergen Reactions  . Altace [Ramipril] Other (See Comments)    hypotension  . Ambien [Zolpidem Tartrate] Other (See Comments)    Sleep walking    Family History  Problem Relation Age of Onset  . Hyperlipidemia Mother   . Hypertension Mother   . Other Mother     varicose veins  . Heart disease Mother   . Rheum arthritis Father   . Heart attack Father   . Heart disease Maternal Grandfather   . Diabetes Sister   . Colon cancer Neg Hx     Prior to Admission medications   Medication Sig Start Date End Date Taking? Authorizing Provider  acetaminophen (TYLENOL) 325 MG tablet Take 650 mg by mouth every 6 (six) hours as needed.   Yes Historical Provider, MD  amLODipine (NORVASC) 10 MG tablet Take 1 tablet (10 mg total) by mouth daily after supper. 11/21/16  Yes Ivan Anchors Love, PA-C  aspirin 325 MG tablet Take 1 tablet (325 mg total) by mouth daily. 11/19/16  Yes Ivan Anchors Love, PA-C  atorvastatin (LIPITOR) 80 MG tablet Take 1 tablet (80 mg total) by mouth daily at 6 PM. 10/30/16  Yes Geradine Girt, DO  baclofen (LIORESAL) 10 mg/mL SUSP Take 2.5 mg by mouth 4 (four) times daily as needed (hiccups).    Yes Historical Provider, MD  Calcium Carbonate-Vitamin D (CALCIUM-CARB 600 + D) 600-125 MG-UNIT TABS Take 1 tablet by mouth daily.     Yes Historical Provider, MD  cefTRIAXone (ROCEPHIN) 1 g injection Inject 1 g into the muscle daily. Give for 5 days   Yes Historical Provider, MD  chlorhexidine (PERIDEX) 0.12 % solution 15 mLs by Mouth Rinse route 2 (two) times daily. 11/19/16  Yes Ivan Anchors Love, PA-C  clopidogrel (PLAVIX) 75 MG tablet Take 1 tablet (75 mg total) by mouth daily. 10/31/16  Yes Geradine Girt, DO  finasteride (PROSCAR) 5 MG tablet Take 5 mg by mouth daily.   Yes  Historical Provider, MD  FLUoxetine (PROZAC) 20 MG capsule Take 1 capsule (20 mg total) by mouth daily. 11/19/16  Yes Ivan Anchors Love, PA-C  food thickener (THICK IT) POWD prn 10/30/16  Yes Geradine Girt, DO  hydrALAZINE (APRESOLINE) 50 MG tablet Take 1 tablet (50 mg total) by mouth every 8 (eight) hours. 11/20/16  Yes Ivan Anchors Love, PA-C  Lactobacillus (PROBIOTIC ACIDOPHILUS PO) Give 1 tablet by mouth daily twice a day for 7 days   Yes Historical Provider, MD  Multiple Vitamin (MULTIVITAMIN) tablet  Take 1 tablet by mouth daily.     Yes Historical Provider, MD  potassium chloride SA (K-DUR,KLOR-CON) 20 MEQ tablet Take 1 tablet (20 mEq total) by mouth 2 (two) times daily. 11/20/16  Yes Ivan Anchors Love, PA-C  Probiotic Product (RISA-BID PROBIOTIC PO) Give 1 tablet by mouth once a day   Yes Historical Provider, MD  promethazine (PHENERGAN) 25 MG suppository Place 25 mg rectally every 6 (six) hours as needed for nausea or vomiting.   Yes Historical Provider, MD  ranitidine (ZANTAC) 75 MG tablet Take 75-150 mg by mouth See admin instructions. 75 mg in the am, 150 mg at bedtime   Yes Historical Provider, MD  senna-docusate (SENOKOT-S) 8.6-50 MG tablet Take 2 tablets by mouth 3 (three) times daily. 11/20/16  Yes Ivan Anchors Love, PA-C  tamsulosin (FLOMAX) 0.4 MG CAPS Take 0.8 mg by mouth daily.    Yes Historical Provider, MD    Physical Exam: Vitals:   01/11/2017 1630 12/28/2016 1753 01/18/2017 1808 12/30/2016 2115  BP: (!) 101/38  (!) 121/45 (!) 107/50  Pulse: 91  81 79  Resp:    18  Temp:   98.6 F (37 C) 97.6 F (36.4 C)  TempSrc:   Oral Oral  SpO2: 92%  90% 94%  Weight:  82.6 kg (182 lb) 81.4 kg (179 lb 8 oz)   Height:   5' 8.5" (1.74 m)      General:  Appears calm and comfortable and is NAD, masked facies Eyes:  PERRL, EOMI, normal lids, iris ENT:  grossly normal hearing, lips & tongue, mmm Neck:  no LAD, masses or thyromegaly Cardiovascular:  RRR, no m/r/g. No LE edema.  Respiratory:  CTA  bilaterally, no w/r/r. Normal respiratory effort. Abdomen:  soft, tender in RUQ, nd, NABS.  No tenderness at all in LQ or suprapubic region. Skin:  no rash or induration seen on limited exam Musculoskeletal: LUE flaccid and edematous (chronically), s/p R BKA Psychiatric: flat affect, speech slow and hesitant but otherwise appropriate, AOx3 Neurologic: as above  Labs on Admission: I have personally reviewed following labs and imaging studies  CBC:  Recent Labs Lab 01/05/17 1700 01/01/2017 1116  WBC 15.4* 18.8*  NEUTROABS 12.4*  --   HGB 10.8* 10.1*  HCT 32.0* 29.8*  MCV 93.3 93.1  PLT 390 742   Basic Metabolic Panel:  Recent Labs Lab 01/05/17 1811 01/02/2017 1116  NA 135 130*  K 4.4 4.4  CL 101 100*  CO2 24 23  GLUCOSE 171* 130*  BUN 23* 22*  CREATININE 1.17 0.98  CALCIUM 8.9 8.3*   GFR: Estimated Creatinine Clearance: 64.1 mL/min (by C-G formula based on SCr of 0.98 mg/dL). Liver Function Tests:  Recent Labs Lab 01/05/17 1811 01/02/2017 1116  AST 49* 57*  ALT 22 26  ALKPHOS 46 43  BILITOT 0.6 0.6  PROT 6.6 6.9  ALBUMIN 2.8* 2.7*    Recent Labs Lab 01/05/17 1811 01/06/2017 1116  LIPASE 12 18   No results for input(s): AMMONIA in the last 168 hours. Coagulation Profile: No results for input(s): INR, PROTIME in the last 168 hours. Cardiac Enzymes: No results for input(s): CKTOTAL, CKMB, CKMBINDEX, TROPONINI in the last 168 hours. BNP (last 3 results) No results for input(s): PROBNP in the last 8760 hours. HbA1C: No results for input(s): HGBA1C in the last 72 hours. CBG: No results for input(s): GLUCAP in the last 168 hours. Lipid Profile: No results for input(s): CHOL, HDL, LDLCALC, TRIG, CHOLHDL, LDLDIRECT in the last  72 hours. Thyroid Function Tests: No results for input(s): TSH, T4TOTAL, FREET4, T3FREE, THYROIDAB in the last 72 hours. Anemia Panel: No results for input(s): VITAMINB12, FOLATE, FERRITIN, TIBC, IRON, RETICCTPCT in the last 72  hours. Urine analysis:    Component Value Date/Time   COLORURINE YELLOW 01/22/2017 1058   APPEARANCEUR CLOUDY (A) 01/02/2017 1058   LABSPEC 1.017 01/13/2017 1058   PHURINE 5.0 01/05/2017 1058   GLUCOSEU NEGATIVE 01/05/2017 1058   HGBUR NEGATIVE 01/03/2017 1058   BILIRUBINUR NEGATIVE 01/01/2017 1058   KETONESUR NEGATIVE 01/01/2017 1058   PROTEINUR 100 (A) 01/19/2017 1058   UROBILINOGEN 0.2 06/01/2013 1752   NITRITE NEGATIVE 01/05/2017 1058   LEUKOCYTESUR MODERATE (A) 01/14/2017 1058    Creatinine Clearance: Estimated Creatinine Clearance: 64.1 mL/min (by C-G formula based on SCr of 0.98 mg/dL).  Sepsis Labs: '@LABRCNTIP'$ (procalcitonin:4,lacticidven:4) ) Recent Results (from the past 240 hour(s))  Blood culture (routine x 2)     Status: None (Preliminary result)   Collection Time: 01/17/2017  3:08 PM  Result Value Ref Range Status   Specimen Description LEFT ANTECUBITAL  Final   Special Requests BOTTLES DRAWN AEROBIC AND ANAEROBIC [facility] EACH  Final   Culture PENDING  Incomplete   Report Status PENDING  Incomplete  Blood culture (routine x 2)     Status: None (Preliminary result)   Collection Time: 01/15/2017  3:15 PM  Result Value Ref Range Status   Specimen Description BLOOD LEFT HAND  Final   Special Requests BOTTLES DRAWN AEROBIC AND ANAEROBIC [facility] EACH  Final   Culture PENDING  Incomplete   Report Status PENDING  Incomplete  MRSA PCR Screening     Status: None   Collection Time: 01/19/2017  8:30 PM  Result Value Ref Range Status   MRSA by PCR NEGATIVE NEGATIVE Final    Comment:        The GeneXpert MRSA Assay (FDA approved for NASAL specimens only), is one component of a comprehensive MRSA colonization surveillance program. It is not intended to diagnose MRSA infection nor to guide or monitor treatment for MRSA infections.      Radiological Exams on Admission: Dg Chest 2 View  Result Date: 01/09/2017 CLINICAL DATA:  Nausea, vomiting, fever, history of stroke EXAM:  CHEST  2 VIEW COMPARISON:  10/28/2016 FINDINGS: Cardiomediastinal silhouette is stable. Status post median sternotomy. No pulmonary edema. Elevation of the right hemidiaphragm again noted. Streaky left base retrocardiac atelectasis or early infiltrate. Mild degenerative changes thoracic spine. IMPRESSION: No pulmonary edema. Streaky left base retrocardiac atelectasis or early infiltrate. Stable chronic elevation of the right hemidiaphragm. Electronically Signed   By: Lahoma Crocker M.D.   On: 01/20/2017 11:55   Ct Abdomen Pelvis W Contrast  Result Date: 01/06/2017 CLINICAL DATA:  Right-sided abdominal pain for 2 days. Worse with deep breaths. Fever. EXAM: CT ABDOMEN AND PELVIS WITH CONTRAST TECHNIQUE: Multidetector CT imaging of the abdomen and pelvis was performed using the standard protocol following bolus administration of intravenous contrast. CONTRAST:  147m ISOVUE-300 IOPAMIDOL (ISOVUE-300) INJECTION 61% COMPARISON:  None. FINDINGS: Lower chest: Trace bilateral pleural effusions. Bilateral lower lobe airspace disease likely reflecting atelectasis. Hepatobiliary: 18 mm hypodense, fluid attenuating right hepatic mass inferiorly most consistent with a cyst. 16 mm hypodense left hepatic mass unchanged compared with 02/09/2013 which is technically indeterminate etiology, but the stability is suggestive of benignity. Cholelithiasis with gallbladder distension, gallbladder wall thickening and mild pericholecystic inflammatory haziness most consistent with acute cholecystitis. No intrahepatic or extrahepatic biliary ductal dilatation. Pancreas: Unremarkable. No pancreatic  ductal dilatation or surrounding inflammatory changes. Spleen: Normal in size without focal abnormality. Adrenals/Urinary Tract: Normal adrenal glands. Bilateral nonobstructing renal calculi. Bilateral renal calculi in the renal pelvis. No obstructive uropathy. Bladder wall thickening which may be secondary to chronic bladder outlet obstruction  versus cystitis. 3.3 cm right bladder diverticulum. Stomach/Bowel: No bowel wall thickening or bowel dilatation. No pneumatosis, pneumoperitoneum or portal venous gas. Vascular/Lymphatic: Normal caliber abdominal aorta with atherosclerosis. No lymphadenopathy. Reproductive: Prostate is unremarkable. Other: No abdominal wall hernia or abnormality. No abdominopelvic ascites. Musculoskeletal: No acute osseous abnormality. No lytic or sclerotic osseous lesion. Degenerative disc disease with disc height loss at L3-4 with bilateral facet arthropathy. Severe bilateral facet arthropathy at L4-5 and L5-S1. IMPRESSION: 1. Cholelithiasis with findings concerning for acute cholecystitis. 2. Bilateral nonobstructing renal calculi. 3. Bladder wall thickening likely secondary to bladder outlet obstruction versus cystitis. 4. Bilateral trace pleural effusions with bibasilar atelectasis. Electronically Signed   By: Kathreen Devoid   On: 01/05/2017 14:29   US Abdomen Limited  Result Date: 01/02/2017 CLINICAL DATA:  Three days of right upper quadrant pain. Gallstones demonstrated on CT scan today. EXAM: US ABDOMEN LIMITED - RIGHT UPPER QUADRANT COMPARISON:  CT scan of the abdomen and pelvis of today's date FINDINGS: Gallbladder: There is a large gallstone in the gallbladder neck measuring 1.9 cm. The gallbladder wall measures 11 mm in thickness. There is no pericholecystic fluid. There is no positive sonographic Murphy's sign. Common bile duct: Diameter: 3.7 mm Liver: The hepatic echotexture is mildly increased diffusely. There is an area of decreased echogenicity in the inferior aspect of the right lobe in the lateral subcapsular region which corresponds to a cystic region on today's CT scan. It measures 2.4 x 1.5 x 2.3 cm. It exhibits a small amount of enhanced through transmission. IMPRESSION: Large gallstone in the gallbladder neck. Moderate gallbladder wall thickening without positive sonographic Murphy's sign. No  pericholecystic fluid. This may reflect subacute or chronic cholecystitis. Increased hepatic echotexture compatible with fatty infiltration. Cyst in the inferior aspect of the right lobe. Electronically Signed   By: David  Martinique M.D.   On: 01/01/2017 16:32    EKG: Not done  Assessment/Plan Principal Problem:   Sepsis [facility]) Active Problems:   Diabetes mellitus with circulatory complication (HCC)   Abdominal pain   Sepsis -Elevated WBC count (WBC 18.8, 15.4 on 2/11, 8 on 1/9), fever to 101.3 with normal lactate and borderline hypotension -Sepsis protocol initiated -Uncertain source so will give Vanc and Zosyn as per sepsis protocol -CXR was read as possible PNA; the patient does have a h/o CVA and is a possible aspiration risk, but he is not complaining of respiratory symptoms and so this seems less likely -CT was read as Cholelithiasis with ?acute cholecystitis.   -RUQ [place] does show a large gallstone in the gallbladder neck measuring 1.9 cm with moderate gallbladder wall thickening c/w subacute or chronic cholecystitis. -AST 57 (49 on 2/11, normal 10/31/16), ALT 26, normal bili and alk phos -For now, we will treat this medically after discussing with Dr. Arnoldo Morale (see his consult note); his surgical risk is high given his recent stroke and need for DAPT (see below).   -Another potential option would be cholecystostomy tube placement by IR. -For now, will try conservative management and follow. -A third possibility as the source of his infection would be UTI/pyelonephritis. -CT shows Bladder outlet obstruction vs. Cystitis -UA 2/11 - TNTC RBC, TNTC RBC, large ketones, neg nitrite, rare bacteria; given 2 doses of  Rocephin and now: 0-5 RBC, TNTC WBC, rare bacteria, mod LE, neg nitrite -He may need inpatient vs. Outpatient urology evaluation. -Blood and urine cultures pending -Will admit and continue to monitor -Will trend lactate   Recent CVA -I discussed the patient briefly with neurology  in [place].   -Ideally, he can remain on his ASA and Plavix.   -If not, Heparin would be too high risk. -Instead the recommendation was for moderate-dose Lovenox (somewhere between DVT prophylaxis dose and treatment-dose Lovenox). -General anesthesia would likely be ok, if needed, per neuro. -Continue home Norvasc, Lipitor, Baclofen.  DM -Glucose 130 -Does not appear to be taking medications for this at [facility] -Will cover with SSI  Malnutrition -Albumin 2.7 -Consider nutrition evaluation once medical problems are more stable  Anemia -Hgb 10.1 (10.8 on 2/11, 12.4 on 1/9) -Normocytic -Uncertain etiology -Will guaiac stools and follow    DVT prophylaxis:  SCDs - too high a bleeding risk with both ASA 325 mg and Plavix as well Code Status:  Full - confirmed with patient/family Family Communication: Wife present throughout evaluation  Disposition Plan: Back to SNF once improved Consults called: General surgery Admission status: Admit - It is my clinical opinion that admission to INPATIENT is reasonable and necessary because this patient will require at least 2 midnights in the hospital to treat this condition based on the medical complexity of the problems presented.  Given the aforementioned information, the predictability of an adverse outcome is felt to be significant.    Karmen Bongo MD [facility]  If 7PM-7AM, please contact night-coverage [email or web address] Password [email or web address]  12/31/2016, 11:40 PM

## 2017-01-06 NOTE — ED Triage Notes (Signed)
Resident at [place].  Sick over the weekend.  Nausea and vomiting.  Fever 101.9 this morning.  C/o abdominal pain.  Had blood work this weekend and WBC was 15,000.   Had one liter of IVF and 2 doses of rocephin at facility.

## 2017-01-06 NOTE — Progress Notes (Signed)
Location:   [facility] Room Number: 128/P Place of Service:  SNF (31) Provider:  Luvenia Starch, MD  Patient Care Team: Wardell Honour, MD as PCP - General (Family Medicine) Chipper Herb, MD as Attending Physician (Family Medicine) Minus Breeding, MD as Consulting Physician (Cardiology)  Extended Emergency Contact Information Primary Emergency Contact: Schellhase,Carol Address: [address], [place] [address] [place] Phone: [phone] Mobile Phone: [phone] Relation: Spouse Secondary Emergency Contact: [place] Mobile Phone: [phone] Relation: Son  Code Status:  Full Code Goals of care: Advanced Directive information Advanced Directives 12/31/2016  Does Patient Have a Medical Advance Directive? Yes  Type of Advance Directive (No Data)  Does patient want to make changes to medical advance directive? No - Patient declined  Copy of [place] in Chart? -  Would patient like information on creating a medical advance directive? No - Patient declined  Pre-existing out of facility DNR order (yellow form or pink MOST form) -     Chief Complaint  Patient presents with  . Acute Visit    Vomiting nad elevated WBC    HPI:  Pt is a 76 y.o. male seen today for an acute visit for    Past Medical History:  Diagnosis Date  . Arthritis   . CAD (coronary artery disease)    2014 75% left main stenosis, LAD 80% followed by a mid subtotal stenosis, circumflex 90% stenosis, right coronary artery occluded, SVG to RCA occluded, SVG ramus occluded, SVG to OM patent without a sequential anastomosis noted. Graft had a focal 99% stenosis which was stented. 3.5 x 16 mm premier. b. cath 10/13/2014 DES to SVG to OM overlap previous stent  . Cholelithiasis 2014  . Cogan syndrome   . Colon polyps   . Diabetes mellitus without complication ([place])     . Dyslipidemia   . Gallstones   . GERD (gastroesophageal reflux disease)   . Hepatic hemangioma 2014  . 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  . Hypertension   . Kidney stones   . Myocardial infarction 1999  . Necrotizing fasciitis ([place])   . Peptic ulcer disease   . Peripheral vascular disease [facility])    Past Surgical History:  Procedure Laterality Date  . APPENDECTOMY  2007  . CORONARY ARTERY BYPASS GRAFT  1999   LIMA to the LAD, saphenous vein graft to obtuse marginal 1 and obtuse marginal 2, spahenous vein graft to intermediate, spahenous vein graft to the right coronary artery in 1999  . LEFT HEART CATHETERIZATION WITH CORONARY ANGIOGRAM N/A 10/13/2014   Procedure: LEFT HEART CATHETERIZATION WITH CORONARY ANGIOGRAM;  Surgeon: Peter M Martinique, MD;  Location: [facility] CATH LAB;  Service: Cardiovascular;  Laterality: N/A;  . LEFT HEART CATHETERIZATION WITH CORONARY/GRAFT ANGIOGRAM N/A 07/19/2013   Procedure: LEFT HEART CATHETERIZATION WITH Beatrix Fetters;  Surgeon: Peter M Martinique, MD;  Location: [facility] CATH LAB;  Service: Cardiovascular;  Laterality: N/A;  . LEG AMPUTATION  1999   Right BKA  . PERCUTANEOUS CORONARY STENT INTERVENTION (PCI-S)  07/19/2013   Procedure: PERCUTANEOUS CORONARY STENT INTERVENTION (PCI-S);  Surgeon: Peter M Martinique, MD;  Location: [facility] CATH LAB;  Service: Cardiovascular;;    Allergies  Allergen Reactions  . Altace [Ramipril] Other (See Comments)    hypotension  . Ambien [Zolpidem Tartrate] Other (See Comments)  Sleep walking    Allergies as of 01/03/2017      Reactions   Altace [ramipril] Other (See Comments)   hypotension   Ambien [zolpidem Tartrate] Other (See Comments)   Sleep walking      Medication List       Accurate as of 01/02/2017 10:53 AM. Always use your most recent med list.          acetaminophen 325 MG tablet Commonly known as:  TYLENOL Take 650 mg by mouth every 6 (six) hours as needed.   amLODipine 10 MG  tablet Commonly known as:  NORVASC Take 1 tablet (10 mg total) by mouth daily after supper.   aspirin 325 MG tablet Take 1 tablet (325 mg total) by mouth daily.   atorvastatin 80 MG tablet Commonly known as:  LIPITOR Take 1 tablet (80 mg total) by mouth daily at 6 PM.   baclofen 10 mg/mL Susp Commonly known as:  LIORESAL Take 2.5 mg by mouth 4 (four) times daily as needed.   CALCIUM-CARB 600 + D 600-125 MG-UNIT Tabs Generic drug:  Calcium Carbonate-Vitamin D Take 1 tablet by mouth daily.   cefTRIAXone 1 g injection Commonly known as:  ROCEPHIN Inject 1 g into the muscle daily. Give for 5 days   chlorhexidine 0.12 % solution Commonly known as:  PERIDEX 15 mLs by Mouth Rinse route 2 (two) times daily.   clopidogrel 75 MG tablet Commonly known as:  PLAVIX Take 1 tablet (75 mg total) by mouth daily.   finasteride 5 MG tablet Commonly known as:  PROSCAR Take 5 mg by mouth daily.   FLUoxetine 20 MG capsule Commonly known as:  PROZAC Take 1 capsule (20 mg total) by mouth daily.   food thickener Powd Commonly known as:  THICK IT prn   hydrALAZINE 50 MG tablet Commonly known as:  APRESOLINE Take 1 tablet (50 mg total) by mouth every 8 (eight) hours.   multivitamin tablet Take 1 tablet by mouth daily.   potassium chloride SA 20 MEQ tablet Commonly known as:  K-DUR,KLOR-CON Take 1 tablet (20 mEq total) by mouth 2 (two) times daily.   PROBIOTIC ACIDOPHILUS PO Give 1 tablet by mouth daily twice a day for 7 days   promethazine 25 MG suppository Commonly known as:  PHENERGAN Place 25 mg rectally every 6 (six) hours as needed for nausea or vomiting.   ranitidine 75 MG tablet Commonly known as:  ZANTAC Take 75-150 mg by mouth See admin instructions. 75 mg in the am, 150 mg at bedtime   RISA-BID PROBIOTIC PO Give 1 tablet by mouth once a day   senna-docusate 8.6-50 MG tablet Commonly known as:  Senokot-S Take 2 tablets by mouth 3 (three) times daily.   tamsulosin  0.4 MG Caps capsule Commonly known as:  FLOMAX Take 0.8 mg by mouth daily.       Review of Systems  There is no immunization history for the selected administration types on file for this patient. Pertinent  Health Maintenance Due  Topic Date Due  . FOOT EXAM  12/27/1950  . OPHTHALMOLOGY EXAM  12/27/1950  . COLONOSCOPY  03/25/2016  . URINE MICROALBUMIN  09/20/2016  . INFLUENZA VACCINE  02/22/2017 (Originally 06/25/2016)  . PNA vac Low Risk Adult (1 of 2 - PCV13) 10/25/2017 (Originally 12/26/2006)  . HEMOGLOBIN A1C  04/28/2017   Fall Risk  09/20/2016 03/21/2016 09/21/2015 11/23/2014  Falls in the past year? Yes Yes No No  Number falls in past yr: 1  2 or more - -  Injury with Fall? No No - -   Functional Status Survey:    Vitals:   01/18/2017 1013  BP: 132/66  Pulse: 77  Resp: 18  Temp: 98.3 F (36.8 C)  TempSrc: Oral   There is no height or weight on file to calculate BMI. Physical Exam  Labs reviewed:  Recent Labs  10/28/16 0146  12/03/16 0710 12/09/16 0730 01/05/17 1811  NA  --   < > 133* 132* 135  K  --   < > 4.0 4.3 4.4  CL  --   < > 104 103 101  CO2  --   < > 23 22 24   GLUCOSE  --   < > 124* 129* 171*  BUN  --   < > 16 15 23*  CREATININE  --   < > 0.87 1.02 1.17  CALCIUM  --   < > 8.8* 8.9 8.9  MG 2.0  --   --   --   --   < > = values in this interval not displayed.  Recent Labs  10/27/16 2110 10/31/16 0526 01/05/17 1811  AST 47* 23 49*  ALT 32 22 22  ALKPHOS 27* 27* 46  BILITOT 0.7 1.2 0.6  PROT 7.0 7.7 6.6  ALBUMIN 3.6 3.4* 2.8*    Recent Labs  11/20/16 0742 11/22/16 0700 12/03/16 0710 01/05/17 1700  WBC 6.6 5.4 8.0 15.4*  NEUTROABS 4.4  --  5.0 12.4*  HGB 11.4* 11.8* 12.4* 10.8*  HCT 32.9* 35.3* 36.3* 32.0*  MCV 94.8 97.0 95.8 93.3  PLT 263 300 297 390   Lab Results  Component Value Date   TSH 3.270 10/12/2014   Lab Results  Component Value Date   HGBA1C 5.9 (H) 10/28/2016   Lab Results  Component Value Date   CHOL 251 (H)  10/28/2016   HDL 43 10/28/2016   LDLCALC 180 (H) 10/28/2016   TRIG 139 10/28/2016   CHOLHDL 5.8 10/28/2016    Significant Diagnostic Results in last 30 days:  No results found.  Assessment/Plan There are no diagnoses linked to this encounter.   Family/ staff Communication:   Labs/tests ordered:     This encounter was created in error - please disregard.

## 2017-01-07 ENCOUNTER — Inpatient Hospital Stay (HOSPITAL_COMMUNITY): Payer: Medicare Other

## 2017-01-07 DIAGNOSIS — N10 Acute tubulo-interstitial nephritis: Secondary | ICD-10-CM

## 2017-01-07 DIAGNOSIS — K810 Acute cholecystitis: Secondary | ICD-10-CM

## 2017-01-07 DIAGNOSIS — A419 Sepsis, unspecified organism: Principal | ICD-10-CM

## 2017-01-07 LAB — CBC
HEMATOCRIT: 29.1 % — AB (ref 39.0–52.0)
HEMOGLOBIN: 9.3 g/dL — AB (ref 13.0–17.0)
MCH: 31.8 pg (ref 26.0–34.0)
MCHC: 32 g/dL (ref 30.0–36.0)
MCV: 99.7 fL (ref 78.0–100.0)
Platelets: 347 10*3/uL (ref 150–400)
RBC: 2.92 MIL/uL — ABNORMAL LOW (ref 4.22–5.81)
RDW: 13.7 % (ref 11.5–15.5)
WBC: 20.9 10*3/uL — ABNORMAL HIGH (ref 4.0–10.5)

## 2017-01-07 LAB — URINE CULTURE

## 2017-01-07 LAB — COMPREHENSIVE METABOLIC PANEL
ALBUMIN: 1.9 g/dL — AB (ref 3.5–5.0)
ALK PHOS: 48 U/L (ref 38–126)
ALT: 1611 U/L — AB (ref 17–63)
AST: 1658 U/L — AB (ref 15–41)
BUN: 27 mg/dL — AB (ref 6–20)
CO2: 13 mmol/L — ABNORMAL LOW (ref 22–32)
CREATININE: 1.96 mg/dL — AB (ref 0.61–1.24)
Calcium: 7.1 mg/dL — ABNORMAL LOW (ref 8.9–10.3)
Chloride: 104 mmol/L (ref 101–111)
GFR calc Af Amer: 37 mL/min — ABNORMAL LOW (ref >60)
GFR calc non Af Amer: 32 mL/min — ABNORMAL LOW (ref >60)
GLUCOSE: 187 mg/dL — AB (ref 65–99)
POTASSIUM: 4.8 mmol/L (ref 3.5–5.1)
SODIUM: 142 mmol/L (ref 135–145)
TOTAL PROTEIN: 5.4 g/dL — AB (ref 6.5–8.1)
Total Bilirubin: 1 mg/dL (ref 0.3–1.2)

## 2017-01-07 LAB — PROTIME-INR
INR: 1.33
INR: 1.79
PROTHROMBIN TIME: 16.6 s — AB (ref 11.4–15.2)
Prothrombin Time: 21 seconds — ABNORMAL HIGH (ref 11.4–15.2)

## 2017-01-07 LAB — LACTIC ACID, PLASMA
Lactic Acid, Venous: 1.9 mmol/L (ref 0.5–1.9)
Lactic Acid, Venous: 17.8 mmol/L (ref 0.5–1.9)

## 2017-01-07 LAB — GLUCOSE, CAPILLARY: GLUCOSE-CAPILLARY: 235 mg/dL — AB (ref 65–99)

## 2017-01-07 LAB — POCT I-STAT, CHEM 8
BUN: 32 mg/dL — AB (ref 6–20)
CREATININE: 1.7 mg/dL — AB (ref 0.61–1.24)
Calcium, Ion: 0.92 mmol/L — ABNORMAL LOW (ref 1.15–1.40)
Chloride: 104 mmol/L (ref 101–111)
Glucose, Bld: 203 mg/dL — ABNORMAL HIGH (ref 65–99)
HEMATOCRIT: 28 % — AB (ref 39.0–52.0)
HEMOGLOBIN: 9.5 g/dL — AB (ref 13.0–17.0)
Potassium: 5.3 mmol/L — ABNORMAL HIGH (ref 3.5–5.1)
Sodium: 141 mmol/L (ref 135–145)
TCO2: 17 mmol/L (ref 0–100)

## 2017-01-07 LAB — TROPONIN I: Troponin I: 5.08 ng/mL (ref <0.03)

## 2017-01-07 LAB — APTT: aPTT: 51 seconds — ABNORMAL HIGH (ref 24–36)

## 2017-01-07 LAB — PROCALCITONIN: Procalcitonin: 0.83 ng/mL

## 2017-01-07 MED ORDER — EPINEPHRINE PF 1 MG/10ML IJ SOSY
PREFILLED_SYRINGE | INTRAMUSCULAR | Status: AC
  Filled 2017-01-07: qty 30

## 2017-01-07 MED ORDER — PIPERACILLIN-TAZOBACTAM 3.375 G IVPB: 3.3750 g | Freq: Three times a day (TID) | INTRAVENOUS | Status: DC

## 2017-01-07 MED ORDER — SODIUM BICARBONATE 8.4 % IV SOLN
INTRAVENOUS | Status: AC
  Filled 2017-01-07: qty 100

## 2017-01-07 MED ORDER — VANCOMYCIN HCL 10 G IV SOLR: 1250.0000 mg | INTRAVENOUS | Status: DC

## 2017-01-07 MED ORDER — NOREPINEPHRINE BITARTRATE 1 MG/ML IV SOLN
5.0000 ug/min | INTRAVENOUS | Status: DC
  Filled 2017-01-07: qty 4

## 2017-01-09 LAB — URINE CULTURE: Culture: >=100000 — AB

## 2017-01-10 MED FILL — Medication: Qty: 1 | Status: AC

## 2017-01-11 LAB — CULTURE, BLOOD (ROUTINE X 2)
CULTURE: NO GROWTH
Culture: NO GROWTH

## 2017-01-16 ENCOUNTER — Ambulatory Visit: Payer: Medicare Other | Admitting: Physical Medicine & Rehabilitation

## 2017-01-23 NOTE — Progress Notes (Signed)
[facility] arrived to pick up patient.

## 2017-01-23 NOTE — Progress Notes (Signed)
Upon entering room around 0330 to make rounds and assess PT's condition/pain, PT was found unresponsive and pulseless. Wife at bedside stated that he was just snoring right before RN entered room. Wife refused to leave room, remaining be PT's side. Code called and monitor attached to PT reading Asystole. Compressions stated. RN unhooked LR and bolus NS at 523ml/hr [id number]. Glucose was 235 at 0333.  At 0336 rhythm on monitor read PEA. Staff continued compressions. Pt was intubated. 12 Epinephrine were administered throughout code, along with 4 amps of Bicarb in 20g RFA. Throughout code Pt's rhythm read PEA or Asystole. At 0406 PT's pulse returned reading 126 and BP of 66/45 with a ECG rhythm of ST. CPR lasted approximately 40 minutes. Levophed drip initiated. ED nurse initiated an 18G into Left EJ and AC began 22G into LFA. MD ordered a transfer to ICU. RN reported to ICU RN to receive Pt. PT transferred to unit with wife at his side.

## 2017-01-23 NOTE — ED Provider Notes (Signed)
3:30 AM  I was called to a CODE BLUE on the floor. Patient found And pulseless upon arrival to nursing. Patient's wife stated that he had previously been snoring. He was admitted for possible sepsis and cholecystitis. On my arrival, he is pulseless and apneic. CPR in progress. Initially he was in asystolic arrest. He progressed to PEA. He received advanced life support measures. He received multiple rounds of epinephrine, bicarbonate, and was intubated. After approximately 40 minutes of CPR, pulses were regained. He was severely hypotensive. He was placed on Levophed drip. Care was transitioned over to the hospitalist with anticipation for transfer to the ICU. I kept the wife informed during the code. She was present in the room. After discussion, it was determined that the patient would not receive further resuscitative efforts if he lost pulses. He is now DO NOT RESUSCITATE per his wife's wishes.  CRITICAL CARE Performed by: Merryl Hacker   Total critical care time: 75 minutes  Critical care time was exclusive of separately billable procedures and treating other patients.  Critical care was necessary to treat or prevent imminent or life-threatening deterioration.  Critical care was time spent personally by me on the following activities: development of treatment plan with patient and/or surrogate as well as nursing, discussions with consultants, evaluation of patient's response to treatment, examination of patient, obtaining history from patient or surrogate, ordering and performing treatments and interventions, ordering and review of laboratory studies, ordering and review of radiographic studies, pulse oximetry and re-evaluation of patient's condition.  Cardiopulmonary Resuscitation (CPR) Procedure Note Directed/Performed by: Merryl Hacker I personally directed ancillary staff and/or performed CPR in an effort to regain return of spontaneous circulation and to maintain cardiac, neuro  and systemic perfusion.   INTUBATION Performed by: Merryl Hacker  Required items: required blood products, implants, devices, and special equipment available Patient identity confirmed: provided demographic data and hospital-assigned identification number Time out: Immediately prior to procedure a "time out" was called to verify the correct patient, procedure, equipment, support staff and site/side marked as required.  Indications: arrest  Intubation method: Direct Laryngoscopy   Preoxygenation: BVM  Sedatives: none Paralytic: none  Tube Size: 7.5 cuffed  Post-procedure assessment: chest rise and ETCO2 monitor Breath sounds: equal and absent over the epigastrium Tube secured with: ETT holder Chest x-ray interpreted by radiologist and me.  Chest x-ray findings: endotracheal tube in appropriate position  Patient tolerated the procedure well with no immediate complications.      Merryl Hacker, MD 01/25/17 [phone]

## 2017-01-23 NOTE — Progress Notes (Signed)
Suction set up at bedside. PT congested and having difficulty coughing and expelling mucous. Tolerated Suction well. HOB elevated at 45 degrees. Spouse at bedside. Continue to monitor

## 2017-01-23 NOTE — Progress Notes (Signed)
Patient earlier  admitted with sepsis due to? Acute cholecystitis, was started on vancomycin and Zosyn. He was seen by general surgery, and surgeon did not feel that patient had cholecystitis. There was a possibility of right-sided pyelonephritis and plan was to continue with antibiotics and consider cholecystostomy tube percutaneously if needed.  CODE BLUE was called after RN found patient unresponsive, required CPR, intubation and mechanical ventilation, with pressor support, Levophed. Patient was transferred to ICU.  Patient expired at 49.

## 2017-01-23 DEATH — deceased

## 2017-02-23 NOTE — Discharge Summary (Signed)
Death Summary  [name] Cook LZJ:[id number] DOB: 07-03-1941 DOA: January 30, 2017  PCP: Wardell Honour, MD   Admit date: 01-30-2017 Date of Death: Feb 28, 2017  Final Diagnoses:  Principal Problem:   Sepsis ([place]) Active Problems:   Diabetes mellitus with circulatory complication ([place])   Cerebral infarction ([place])   Abdominal pain   Moderate malnutrition ([place])   Anemia      History of present illness:    76 y.o. male with medical history significant of PVD with necrotizing fasciitis s/p R BKA; CAD; HTN; HLD; DM; and cholelithiasis in 2014 as well as a CVA in December, admitted from 12/3-after Christmas.  Was transferred to [facility] for rehab.  Still there and getting rehab.  Over the last few days, he has had some problems with right-sided abdominal pain.  Vomiting started Saturday night.  Sunday, had 1 additional episode of emesis.  Did appear to have nausea with pills today but no further emesis today.  Fever Saturday and Sunday, subjective, 101 today.  No diarrhea this week, but he was impacted several weeks ago and this was treated and led to some diarrhea.    Per facility: confused as normal with abdominal pain.  88% on RA.  T 101.9.  N/V over weekend.  WBC 15.  1L IVF and Rocephin 1 gram x 2 doses.    H/o CABG in 1999, developed nec fasc in harvested vein area, so they ended up amputating.  S/p BKA.  Wears prosthesis.    In the ER, she had an abnormal CT and so surgery was consulted.  Also with ? Of pyelonephritis.  Given Zosyn.  See detailed H&P by Dr Tor Netters Course:   Patient was admitted with sepsis due to? Acute cholecystitis, was started on vancomycin and Zosyn. He was seen by general surgery, and surgeon did not feel that patient had cholecystitis. There was a possibility of right-sided pyelonephritis and plan was to continue with antibiotics and to consider cholecystostomy tube percutaneously if needed.  At night patient was found to be  unresponsive.  CODE BLUE was called , required CPR, intubation and mechanical ventilation, with pressor support, Levophed. Patient was transferred to ICU.  Patient expired at [facility]  on Jan 31, 2017     Signed:  [facility]  [facility] 2017/02/28, 8:11 PM

## 2017-04-01 ENCOUNTER — Ambulatory Visit: Payer: Medicare Other | Admitting: Vascular Surgery

## 2017-04-01 ENCOUNTER — Encounter (HOSPITAL_COMMUNITY): Payer: Medicare Other

## 2017-04-09 ENCOUNTER — Ambulatory Visit: Payer: Medicare Other | Admitting: Cardiology
